# Patient Record
Sex: Female | Born: 1965 | Race: White | Hispanic: No | Marital: Married | Smoking: Former smoker
Health system: Southern US, Community
[De-identification: ages and names within clinical notes are randomized; demographics above are authoritative.]

## PROBLEM LIST (undated history)

## (undated) DIAGNOSIS — F329 Major depressive disorder, single episode, unspecified: Secondary | ICD-10-CM

## (undated) DIAGNOSIS — F319 Bipolar disorder, unspecified: Secondary | ICD-10-CM

## (undated) DIAGNOSIS — I10 Essential (primary) hypertension: Secondary | ICD-10-CM

## (undated) DIAGNOSIS — I2510 Atherosclerotic heart disease of native coronary artery without angina pectoris: Secondary | ICD-10-CM

## (undated) DIAGNOSIS — F419 Anxiety disorder, unspecified: Secondary | ICD-10-CM

## (undated) DIAGNOSIS — Z720 Tobacco use: Secondary | ICD-10-CM

## (undated) DIAGNOSIS — Z9989 Dependence on other enabling machines and devices: Secondary | ICD-10-CM

## (undated) DIAGNOSIS — F909 Attention-deficit hyperactivity disorder, unspecified type: Secondary | ICD-10-CM

## (undated) DIAGNOSIS — I779 Disorder of arteries and arterioles, unspecified: Secondary | ICD-10-CM

## (undated) DIAGNOSIS — I739 Peripheral vascular disease, unspecified: Secondary | ICD-10-CM

## (undated) DIAGNOSIS — M797 Fibromyalgia: Secondary | ICD-10-CM

## (undated) DIAGNOSIS — I639 Cerebral infarction, unspecified: Secondary | ICD-10-CM

## (undated) DIAGNOSIS — J439 Emphysema, unspecified: Secondary | ICD-10-CM

## (undated) DIAGNOSIS — G4730 Sleep apnea, unspecified: Secondary | ICD-10-CM

## (undated) DIAGNOSIS — J449 Chronic obstructive pulmonary disease, unspecified: Secondary | ICD-10-CM

## (undated) DIAGNOSIS — G4733 Obstructive sleep apnea (adult) (pediatric): Secondary | ICD-10-CM

## (undated) DIAGNOSIS — I5032 Chronic diastolic (congestive) heart failure: Secondary | ICD-10-CM

## (undated) DIAGNOSIS — K219 Gastro-esophageal reflux disease without esophagitis: Secondary | ICD-10-CM

## (undated) DIAGNOSIS — F32A Depression, unspecified: Secondary | ICD-10-CM

## (undated) DIAGNOSIS — E785 Hyperlipidemia, unspecified: Secondary | ICD-10-CM

## (undated) HISTORY — DX: Emphysema, unspecified: J43.9

## (undated) HISTORY — DX: Chronic obstructive pulmonary disease, unspecified: J44.9

## (undated) HISTORY — DX: Chronic diastolic (congestive) heart failure: I50.32

## (undated) HISTORY — DX: Sleep apnea, unspecified: G47.30

## (undated) HISTORY — PX: CARDIAC CATHETERIZATION: SHX172

---

## 1988-10-25 HISTORY — PX: TUBAL LIGATION: SHX77

## 2006-09-07 ENCOUNTER — Emergency Department (HOSPITAL_COMMUNITY): Admission: EM | Admit: 2006-09-07 | Discharge: 2006-09-07 | Payer: Self-pay | Admitting: Emergency Medicine

## 2007-10-26 HISTORY — PX: CORONARY ARTERY BYPASS GRAFT: SHX141

## 2008-09-14 ENCOUNTER — Emergency Department (HOSPITAL_COMMUNITY): Admission: EM | Admit: 2008-09-14 | Discharge: 2008-09-14 | Payer: Self-pay | Admitting: Emergency Medicine

## 2008-10-07 ENCOUNTER — Ambulatory Visit: Payer: Self-pay | Admitting: Cardiothoracic Surgery

## 2008-10-08 ENCOUNTER — Encounter: Payer: Self-pay | Admitting: Cardiothoracic Surgery

## 2008-10-08 ENCOUNTER — Inpatient Hospital Stay (HOSPITAL_COMMUNITY): Admission: EM | Admit: 2008-10-08 | Discharge: 2008-10-17 | Payer: Self-pay | Admitting: Emergency Medicine

## 2008-10-09 ENCOUNTER — Encounter: Payer: Self-pay | Admitting: Cardiothoracic Surgery

## 2008-11-08 ENCOUNTER — Ambulatory Visit: Payer: Self-pay | Admitting: Cardiothoracic Surgery

## 2008-11-08 ENCOUNTER — Encounter: Admission: RE | Admit: 2008-11-08 | Discharge: 2008-11-08 | Payer: Self-pay | Admitting: Cardiothoracic Surgery

## 2008-11-22 ENCOUNTER — Encounter: Admission: RE | Admit: 2008-11-22 | Discharge: 2008-11-22 | Payer: Self-pay | Admitting: Cardiothoracic Surgery

## 2008-11-22 ENCOUNTER — Ambulatory Visit: Payer: Self-pay | Admitting: Cardiothoracic Surgery

## 2008-12-23 DIAGNOSIS — I639 Cerebral infarction, unspecified: Secondary | ICD-10-CM

## 2008-12-23 HISTORY — DX: Cerebral infarction, unspecified: I63.9

## 2009-06-17 ENCOUNTER — Emergency Department (HOSPITAL_COMMUNITY): Admission: EM | Admit: 2009-06-17 | Discharge: 2009-06-17 | Payer: Self-pay | Admitting: Emergency Medicine

## 2009-06-26 ENCOUNTER — Encounter: Payer: Self-pay | Admitting: Cardiology

## 2009-06-30 ENCOUNTER — Emergency Department (HOSPITAL_COMMUNITY): Admission: EM | Admit: 2009-06-30 | Discharge: 2009-06-30 | Payer: Self-pay | Admitting: Emergency Medicine

## 2009-09-23 ENCOUNTER — Encounter (INDEPENDENT_AMBULATORY_CARE_PROVIDER_SITE_OTHER): Payer: Self-pay | Admitting: *Deleted

## 2009-09-23 ENCOUNTER — Ambulatory Visit: Payer: Self-pay | Admitting: Cardiology

## 2009-09-23 DIAGNOSIS — IMO0001 Reserved for inherently not codable concepts without codable children: Secondary | ICD-10-CM

## 2009-09-23 DIAGNOSIS — I10 Essential (primary) hypertension: Secondary | ICD-10-CM | POA: Insufficient documentation

## 2009-09-23 DIAGNOSIS — M797 Fibromyalgia: Secondary | ICD-10-CM | POA: Insufficient documentation

## 2009-09-23 DIAGNOSIS — F17200 Nicotine dependence, unspecified, uncomplicated: Secondary | ICD-10-CM | POA: Insufficient documentation

## 2009-09-23 DIAGNOSIS — F0630 Mood disorder due to known physiological condition, unspecified: Secondary | ICD-10-CM | POA: Insufficient documentation

## 2009-09-23 DIAGNOSIS — G2581 Restless legs syndrome: Secondary | ICD-10-CM | POA: Insufficient documentation

## 2009-09-23 DIAGNOSIS — K219 Gastro-esophageal reflux disease without esophagitis: Secondary | ICD-10-CM | POA: Insufficient documentation

## 2009-09-29 ENCOUNTER — Encounter: Payer: Self-pay | Admitting: Cardiology

## 2009-09-29 ENCOUNTER — Encounter (INDEPENDENT_AMBULATORY_CARE_PROVIDER_SITE_OTHER): Payer: Self-pay | Admitting: *Deleted

## 2009-09-29 LAB — CONVERTED CEMR LAB
AST: 13 units/L (ref 0–37)
Albumin: 4.7 g/dL
Basophils Absolute: 0.1 10*3/uL (ref 0.0–0.1)
Basophils Relative: 1 % (ref 0–1)
CO2: 24 meq/L
Calcium: 9.9 mg/dL
Calcium: 9.9 mg/dL (ref 8.4–10.5)
Chloride: 98 meq/L (ref 96–112)
Cholesterol: 178 mg/dL (ref 0–200)
Creatinine, Ser: 0.7 mg/dL
Eosinophils Absolute: 0.1 10*3/uL (ref 0.0–0.7)
HDL: 43 mg/dL
Lymphocytes Relative: 32 % (ref 12–46)
Lymphs Abs: 3 10*3/uL (ref 0.7–4.0)
Monocytes Absolute: 0.5 10*3/uL (ref 0.1–1.0)
RBC: 4.82 M/uL (ref 3.87–5.11)
Sodium: 136 meq/L (ref 135–145)
TSH: 1.189 microintl units/mL
TSH: 1.189 microintl units/mL (ref 0.350–4.500)
Total CHOL/HDL Ratio: 4.1
Total Protein: 8.1 g/dL
Triglycerides: 185 mg/dL
VLDL: 37 mg/dL (ref 0–40)
WBC: 9.4 10*3/uL (ref 4.0–10.5)

## 2009-10-02 ENCOUNTER — Ambulatory Visit: Payer: Self-pay | Admitting: Cardiology

## 2009-10-02 ENCOUNTER — Ambulatory Visit (HOSPITAL_COMMUNITY): Admission: RE | Admit: 2009-10-02 | Discharge: 2009-10-02 | Payer: Self-pay | Admitting: Cardiology

## 2009-10-02 ENCOUNTER — Encounter (INDEPENDENT_AMBULATORY_CARE_PROVIDER_SITE_OTHER): Payer: Self-pay | Admitting: *Deleted

## 2009-10-02 ENCOUNTER — Encounter: Payer: Self-pay | Admitting: Cardiology

## 2009-10-03 ENCOUNTER — Encounter (INDEPENDENT_AMBULATORY_CARE_PROVIDER_SITE_OTHER): Payer: Self-pay | Admitting: *Deleted

## 2009-10-07 ENCOUNTER — Ambulatory Visit: Payer: Self-pay | Admitting: Cardiology

## 2009-10-07 ENCOUNTER — Encounter (INDEPENDENT_AMBULATORY_CARE_PROVIDER_SITE_OTHER): Payer: Self-pay | Admitting: *Deleted

## 2009-10-07 DIAGNOSIS — I639 Cerebral infarction, unspecified: Secondary | ICD-10-CM | POA: Insufficient documentation

## 2009-10-07 DIAGNOSIS — I679 Cerebrovascular disease, unspecified: Secondary | ICD-10-CM

## 2009-10-08 ENCOUNTER — Encounter: Payer: Self-pay | Admitting: Cardiology

## 2009-12-03 ENCOUNTER — Emergency Department (HOSPITAL_COMMUNITY): Admission: EM | Admit: 2009-12-03 | Discharge: 2009-12-04 | Payer: Self-pay | Admitting: Emergency Medicine

## 2010-01-11 ENCOUNTER — Emergency Department (HOSPITAL_COMMUNITY): Admission: EM | Admit: 2010-01-11 | Discharge: 2010-01-11 | Payer: Self-pay | Admitting: Emergency Medicine

## 2010-07-07 ENCOUNTER — Encounter (INDEPENDENT_AMBULATORY_CARE_PROVIDER_SITE_OTHER): Payer: Self-pay | Admitting: *Deleted

## 2010-07-21 IMAGING — CR DG CHEST 1V PORT
1 series · 1 of 1 positions shown · non-contrast
Comparison: Portable chest 10/13/2008.

CLINICAL DATA: Status post CABG.

PORTABLE CHEST - 1 VIEW

[view not recorded]
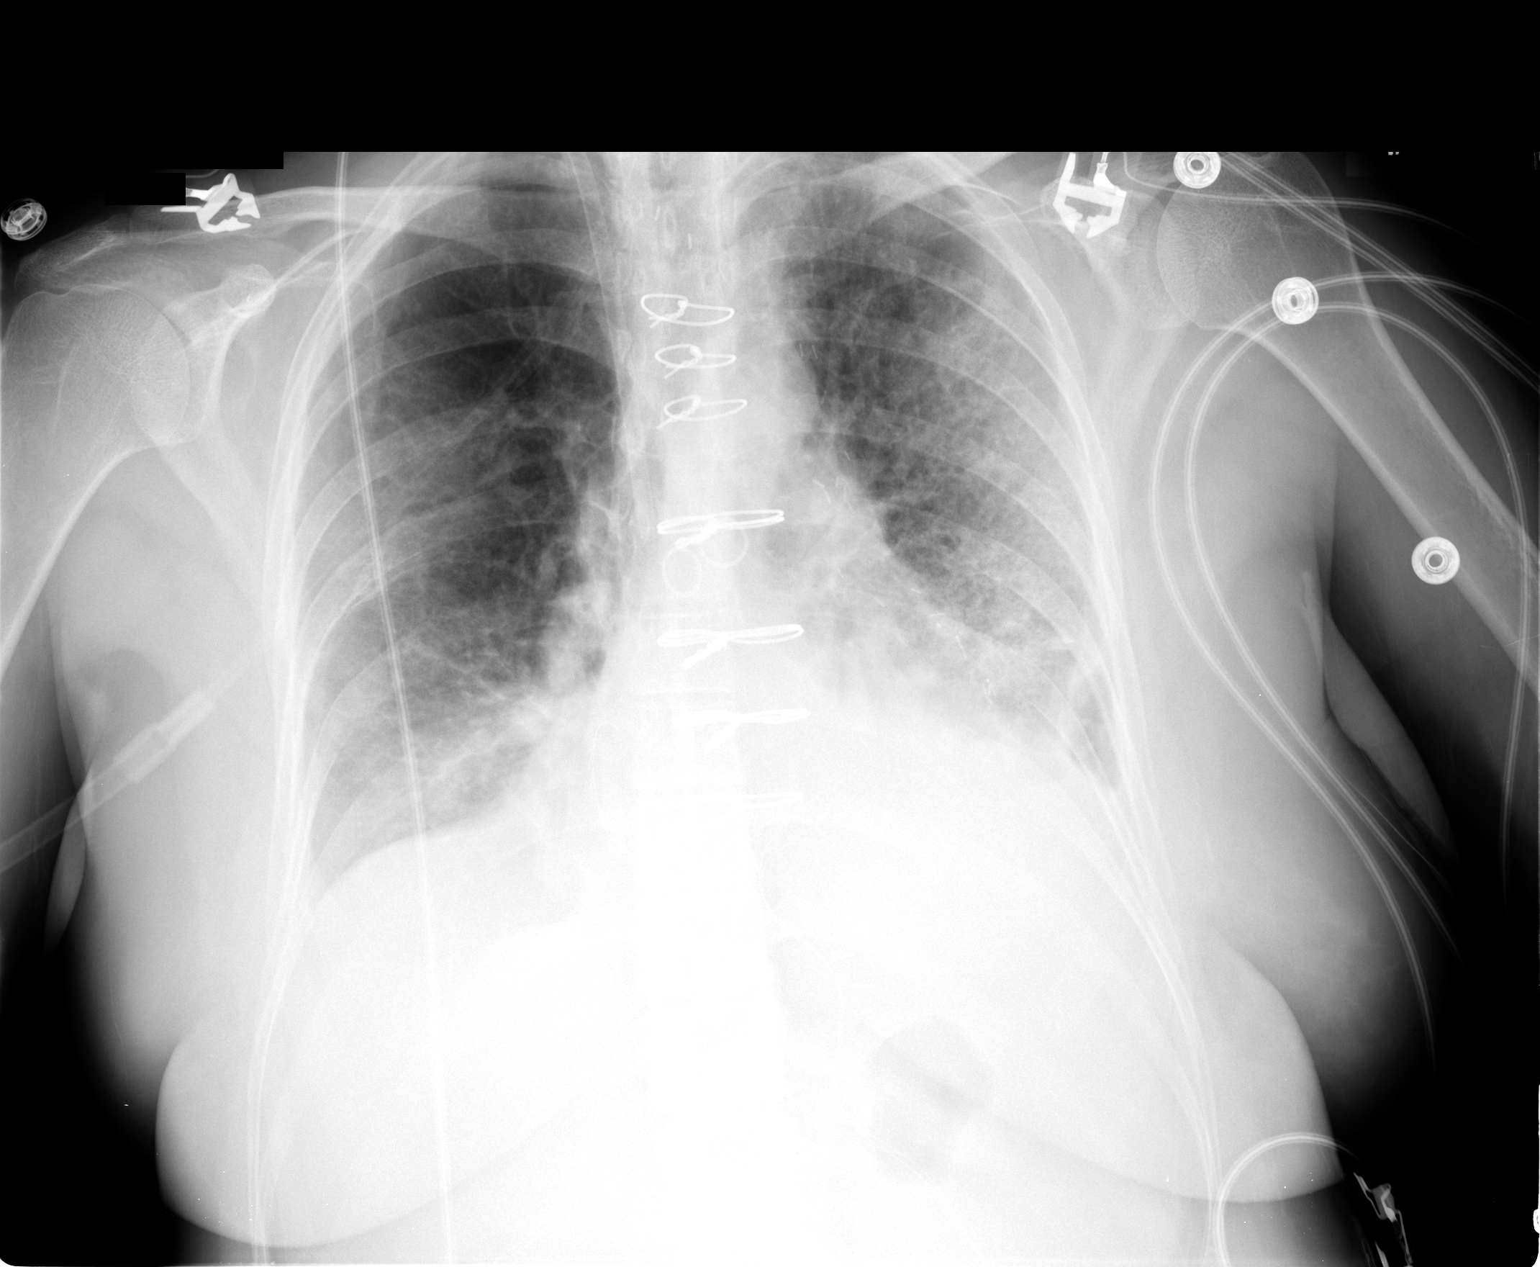

[1 of 1 positions shown; findings below may reference images not displayed]

FINDINGS: Right IJ sheath remains in place.  Aeration of the right
chest is markedly improved.  Small left effusion and basilar air
space disease persist.
IMPRESSION: Marked improvement right-sided aeration is likely due to decreased
pulmonary edema.  No other change.

## 2010-08-29 IMAGING — CR DG CHEST 2V
2 series · 2 of 2 positions shown · non-contrast
Comparison: 11/08/2008.

CLINICAL DATA: Status post CABG 10/10/2008.

CHEST - 2 VIEW

[w chest pa]
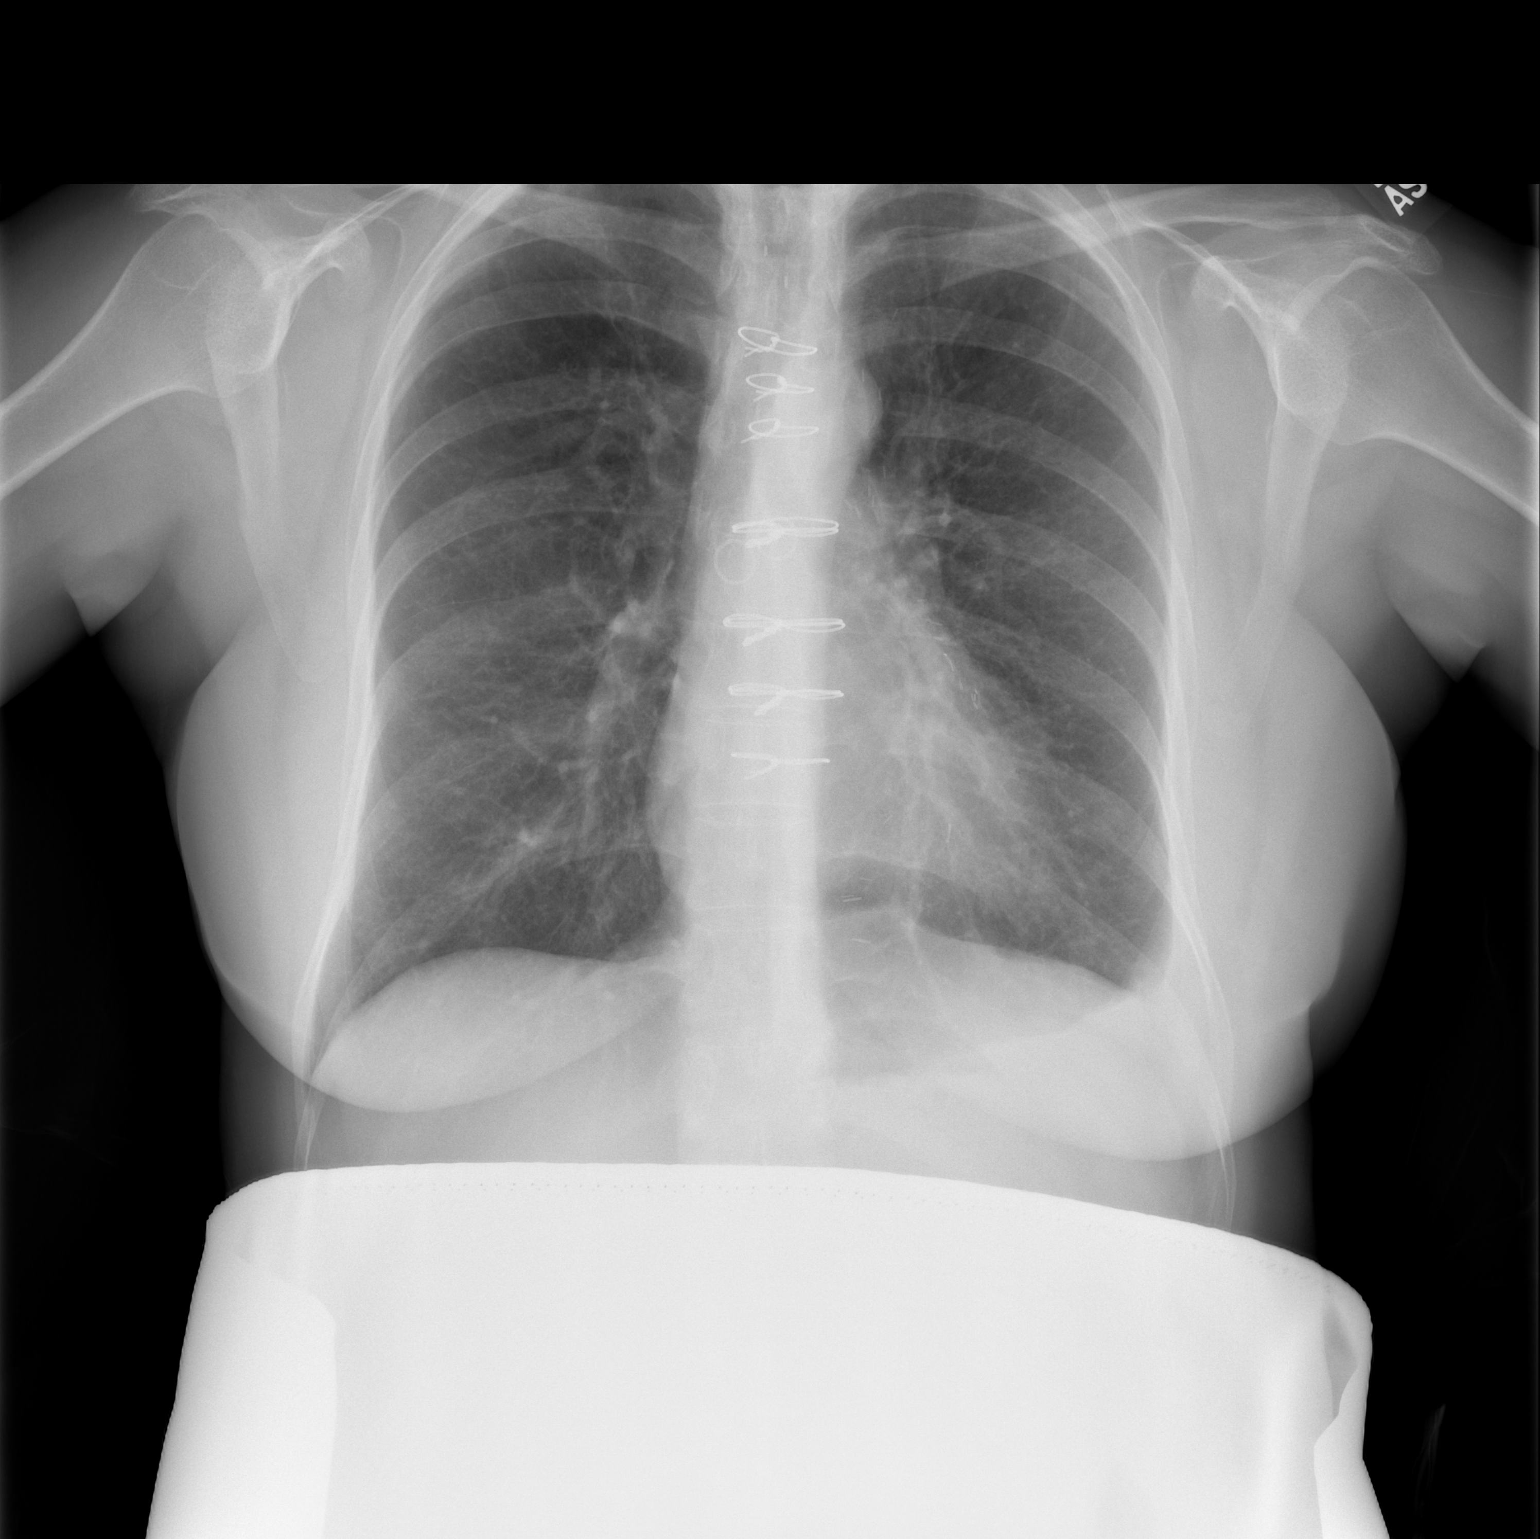

[w chest lat]
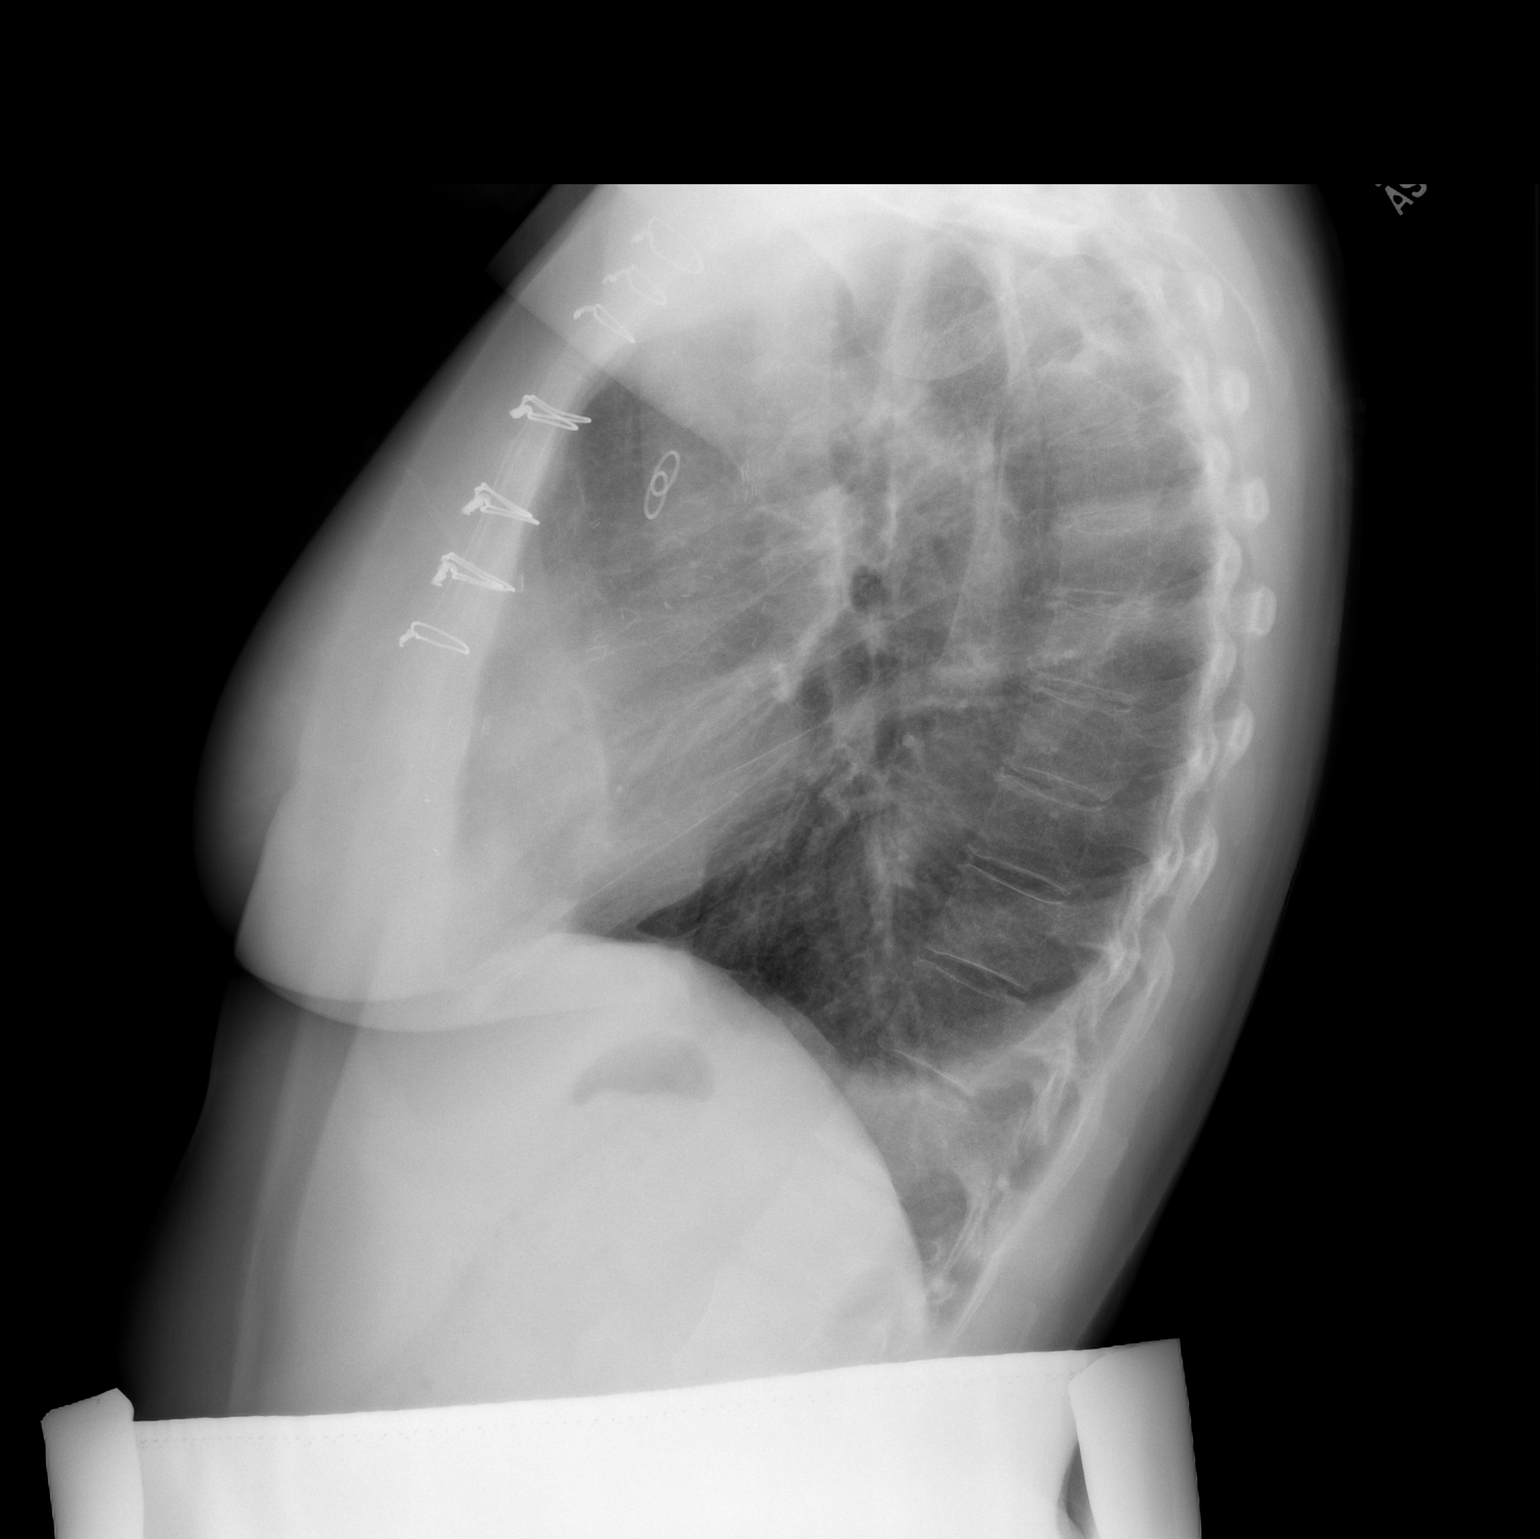

[2 of 2 positions shown; findings below may reference images not displayed]

FINDINGS: The cardiopericardial silhouette is within normal limits
for size.  The patient is status post median sternotomy for CABG.
There is interval decrease and a left pleural effusion.  Minimal
atelectasis is present at the left base.  There is improved overall
aeration.
IMPRESSION: 1.  Continued decrease in left pleural effusion.
2.  Improved aeration of both lungs.

## 2010-11-15 ENCOUNTER — Encounter: Payer: Self-pay | Admitting: Cardiology

## 2010-11-24 NOTE — Miscellaneous (Signed)
Summary: lisinopril refill  Clinical Lists Changes  Medications: Rx of LISINOPRIL 40 MG TABS (LISINOPRIL) Take one tablet by mouth daily;  #90 x 3;  Signed;  Entered by: Teressa Lower RN;  Authorized by: Kathlen Brunswick, MD, [facility];  Method used: Electronically to [facility] [address], [place], [place], [place]  [address], Ph: [phone], Fax: [phone]    Prescriptions: LISINOPRIL 40 MG TABS (LISINOPRIL) Take one tablet by mouth daily  #90 x 3   Entered by:   Teressa Lower RN   Authorized by:   Kathlen Brunswick, MD, [facility]   Signed by:   Teressa Lower RN on 07/07/2010   Method used:   Electronically to        [facility] (retail)       [address]       [place], [place]  [address]       Ph: [phone]       Fax: [phone]   RxID:   [id number]

## 2011-01-13 LAB — CBC
HCT: 40.3 % (ref 36.0–46.0)
MCHC: 34.2 g/dL (ref 30.0–36.0)
MCV: 87.8 fL (ref 78.0–100.0)
RBC: 4.59 MIL/uL (ref 3.87–5.11)

## 2011-01-13 LAB — BASIC METABOLIC PANEL
CO2: 25 mEq/L (ref 19–32)
Creatinine, Ser: 0.67 mg/dL (ref 0.4–1.2)
Glucose, Bld: 96 mg/dL (ref 70–99)
Sodium: 136 mEq/L (ref 135–145)

## 2011-01-13 LAB — DIFFERENTIAL
Basophils Absolute: 0.2 10*3/uL — ABNORMAL HIGH (ref 0.0–0.1)
Basophils Relative: 1 % (ref 0–1)
Monocytes Relative: 5 % (ref 3–12)
Neutrophils Relative %: 69 % (ref 43–77)

## 2011-01-13 LAB — URINALYSIS, ROUTINE W REFLEX MICROSCOPIC
Bilirubin Urine: NEGATIVE
Hgb urine dipstick: NEGATIVE
Ketones, ur: 15 mg/dL — AB
Protein, ur: NEGATIVE mg/dL
Specific Gravity, Urine: 1.024 (ref 1.005–1.030)

## 2011-01-29 LAB — RAPID STREP SCREEN (MED CTR MEBANE ONLY): Streptococcus, Group A Screen (Direct): POSITIVE — AB

## 2011-01-30 LAB — COMPREHENSIVE METABOLIC PANEL
ALT: 14 U/L (ref 0–35)
Albumin: 3.8 g/dL (ref 3.5–5.2)
Alkaline Phosphatase: 86 U/L (ref 39–117)
Chloride: 102 mEq/L (ref 96–112)
Potassium: 3.3 mEq/L — ABNORMAL LOW (ref 3.5–5.1)
Sodium: 137 mEq/L (ref 135–145)
Total Bilirubin: 0.7 mg/dL (ref 0.3–1.2)
Total Protein: 7.1 g/dL (ref 6.0–8.3)

## 2011-01-30 LAB — CBC
HCT: 37.1 % (ref 36.0–46.0)
HCT: 37.9 % (ref 36.0–46.0)
Hemoglobin: 12.5 g/dL (ref 12.0–15.0)
MCHC: 33.7 g/dL (ref 30.0–36.0)
Platelets: 327 10*3/uL (ref 150–400)
RBC: 4.09 MIL/uL (ref 3.87–5.11)
RDW: 15.3 % (ref 11.5–15.5)
WBC: 21.1 10*3/uL — ABNORMAL HIGH (ref 4.0–10.5)

## 2011-01-30 LAB — POCT CARDIAC MARKERS
CKMB, poc: <1 ng/mL — ABNORMAL LOW (ref 1.0–8.0)
Myoglobin, poc: 68 ng/mL (ref 12–200)

## 2011-01-30 LAB — DIFFERENTIAL
Basophils Absolute: 0.1 10*3/uL (ref 0.0–0.1)
Basophils Relative: 0 % (ref 0–1)
Eosinophils Absolute: 0.1 10*3/uL (ref 0.0–0.7)
Eosinophils Relative: 0 % (ref 0–5)
Eosinophils Relative: 0 % (ref 0–5)
Lymphocytes Relative: 11 % — ABNORMAL LOW (ref 12–46)
Lymphs Abs: 2.4 10*3/uL (ref 0.7–4.0)
Monocytes Absolute: 0.3 10*3/uL (ref 0.1–1.0)
Monocytes Relative: 2 % — ABNORMAL LOW (ref 3–12)
Neutro Abs: 18.1 10*3/uL — ABNORMAL HIGH (ref 1.7–7.7)
Neutrophils Relative %: 86 % — ABNORMAL HIGH (ref 43–77)

## 2011-01-30 LAB — URINALYSIS, ROUTINE W REFLEX MICROSCOPIC
Glucose, UA: NEGATIVE mg/dL
Ketones, ur: NEGATIVE mg/dL
Protein, ur: NEGATIVE mg/dL
Urobilinogen, UA: 0.2 mg/dL (ref 0.0–1.0)

## 2011-01-30 LAB — URINE MICROSCOPIC-ADD ON

## 2011-01-30 LAB — POCT I-STAT, CHEM 8
BUN: 5 mg/dL — ABNORMAL LOW (ref 6–23)
Chloride: 104 mEq/L (ref 96–112)
Creatinine, Ser: 0.6 mg/dL (ref 0.4–1.2)
Potassium: 3.5 mEq/L (ref 3.5–5.1)
Sodium: 137 mEq/L (ref 135–145)
TCO2: 23 mmol/L (ref 0–100)

## 2011-01-30 LAB — CK TOTAL AND CKMB (NOT AT ARMC)
CK, MB: 1.2 ng/mL (ref 0.3–4.0)
Total CK: 63 U/L (ref 7–177)

## 2011-03-09 NOTE — Discharge Summary (Signed)
[name], [name]               ACCOUNT NO.:  [id number]   MEDICAL RECORD NO.:  [id number]          PATIENT TYPE:  INP   LOCATION:  2021                         FACILITY:  [facility]   PHYSICIAN:  Kerin Perna, M.D.  DATE OF BIRTH:  [birth year]   DATE OF ADMISSION:  10/07/2008  DATE OF DISCHARGE:  10/17/2008                               DISCHARGE SUMMARY   ADMITTING DIAGNOSES:  1. Non-ST segment elevation myocardial infarction.  2. Multivessel coronary artery disease (with an ejection fraction of      greater than 55%).  3. History of hypertension.  4. History of tobacco abuse.  5. History of gastroesophageal reflux disease.  6. History of bipolar disorder.   DISCHARGE DIAGNOSIS:  1. Non-ST segment elevation myocardial infarction.  2. Multivessel coronary artery disease (with an ejection fraction of      greater than 55%).  3. History of hypertension.  4. History of tobacco abuse.  5. History of gastroesophageal reflux disease.  6. History of bipolar disorder.  7. Newly diagnosed hyperlipidemia and elevated ESR.   PROCEDURES:  1. Cardiac catheterization performed on October 08, 2008, by Dr.      Clarene Duke, which showed an ejection fraction of greater than 55%, 80%      narrowing of the left anterior descending as well as an associated      ruptured plaque, 40-50% tubular area of narrowing of the right      coronary artery.  2. Coronary artery bypass grafting x3 (left internal mammary artery to      left anterior descending, saphenous vein graft to ramus      intermedius, and saphenous vein graft to right coronary artery with      endovein harvest on the left thigh done by Dr. Donata Clay on      October 10, 2008.   HISTORY OF PRESENT ILLNESS:  This is a 45 year old Caucasian female with  prior medical history of hypertension and tobacco abuse who was admitted  to [facility] with complaint of chest pain.  The patient states this  chest pain began approximately 2 weeks prior  to admission.  She  described it as a pressure-like sensation beneath the sternum that occur  with activity and was relieved with either rest or lying down.  She  initially presented to [facility] where apparently an MI  was ruled out.  However, her chest pain continued and then worsened.  She then had complaints of constant pressure radiating to both arms and  shoulders with some nauseousness and shortness of breath.  She also  described some dyspnea upon exertion and generalized weakness.  The  patient presented to [facility] Room for further evaluation  and treatment.  Her initial EKG showed sinus rhythm without acute  changes.  CPK-MB was found to be 10.1 and initial troponin was 0.3, it  did go as high as 2.10, however.  She was placed on IV heparin and  nitroglycerin.  Cardiology was consulted.  She underwent the  aforementioned cardiac catheterization on August 08, 2008, and was  found to have  multivessel coronary artery disease.  A cardiothoracic  consultation was then obtained with Dr. Donata Clay.  Preoperative duplex  carotid ultrasound showed 60-80% internal carotid artery stenosis of the  right but no significant left internal carotid artery stenosis.  The  patient then underwent the aforementioned coronary artery bypass  grafting surgery with Dr. Donata Clay on October 10, 2008.   BRIEF HOSPITAL COURSE STAY:  The patient was extubated the evening of  surgery.  She was A-paced.  She remained afebrile and hemodynamically  stable.  She was weaned off her drips as tolerated.  She was volume  overloaded, and once her drips were weaned off, diuresis was initiated.  All chest tubes were removed by October 11, 2008.  Followup chest x-ray  revealed no pneumothorax, slight increase in prominence of pulmonary  edema and pleural effusions.  Aggressive pulmonary toilet was initiated  for hypoxia and was still requiring a several liters of oxygen via nasal   cannula.  ESR on October 13, 2008, was found to be 57.  The patient was  then placed on Solu-Medrol for several doses.  This was then stopped and  she was started on prednisone.  She gradually continued to improve from  a pulmonary standpoint over the next couple of days.  She was then  transferred from the intensive care unit to [facility] for further  convalescence.  Currently on postoperative day #7, the patient is  without complaint, she would like to go home.  She is afebrile, heart  rate is in the 70s, BP is 160/65, O2 sat is 90% on room air.  She was on  1 L prior and her O2 sat was 93%.  Preoperative weight is 64 kg, today's  weight is 67.7 kg.   PHYSICAL EXAMINATION:  CARDIOVASCULAR:  Regular rate and rhythm.  PULMONARY:  Clear but diminished at the left base.  ABDOMEN:  Soft, nontender.  Bowel sounds present.  EXTREMITY:  A +1 edema of the lower extremities.  WOUNDS:  Both sternal wounds and bilateral leg wounds are clean and dry.   The patient has already been tolerating a diet and continued to progress  well with cardiac rehab.  She remained on 94-95% on room air after  walking earlier this morning and provided she remains afebrile and  stable.  She is going to be discharged home later today.   Last laboratory results all are as follows:  BMET done today, potassium  4.1, BUN and creatinine 24 and 0.78 respectively.  A CBC done on  October 16, 2008, H and H 9.5 and 29.2, white count of 16,500, platelet  count of 449,000.  Last chest x-ray done today showed no significant  change, persistent left lower lobe atelectasis and small pleural  effusion, mild interstitial edema.   DISCHARGE INSTRUCTIONS:  The patient is to remain on a low-fat, low-salt  diet.  No driving or heavy lifting more than 10 pounds.  She is to  continue with her breathing exercises daily.  She is to walk everyday  and increase her frequency and duration as tolerates.  She may shower.  She is to cleanse her  wounds with mild soap and water.  She is to call  the office if any wound problems arise.  She is instructed to stop  smoking and given a number to enroll in smoking cessation classes.   FOLLOWUP APPOINTMENTS:  The patient is to contact Dr. Fredirick Maudlin office  for a followup appointment in 2 weeks.  She  will be contacted by Dr. Zenaida Niece  Trigt's office to have a followup appointment with him in 3 previous,  and prior to this office appointment, a chest x-ray will be obtained.   DISCHARGE MEDICATIONS:  1. Enteric-coated aspirin 81 mg p.o. daily.  2. Lopressor 12.5 mg p.o. 2 times daily.  3. Crestor 10 mg p.o. at bedtime.  4. Lasix 40 mg p.o. daily.  5. KCl 20 mEq p.o. daily both x7 days.  6. Avelox 400 mg p.o. daily x5 days.  7. Prednisone 20 mg p.o. daily x5 days, then 10 mg p.o. daily x2 days,      and then stop.  8. Oxycodone 5 mg 1-2 p.o. q. 4-6 h. as needed for pain.  9. Abilify 10 mg p.o. daily.  10.Zegerid 40/1100 mg p.o. daily.  11.Prozac 40 mg p.o. daily.  12.Strattera 60 mg p.o. daily.  13.Cyclobenzaprine 10 mg p.o. 2 times daily p.r.n.  14.Xanax 0.5 mg p.o. 2 times daily p.r.n.      Doree Fudge, [place]      Kerin Perna, M.D.  Electronically Signed    DZ/MEDQ  D:  10/17/2008  T:  10/18/2008  Job:  [id number]   cc:   [name], M.D.  [place] Dr. Devota Pace

## 2011-03-09 NOTE — Cardiovascular Report (Signed)
[name], [name]               ACCOUNT NO.:  [id number]   MEDICAL RECORD NO.:  [id number]          PATIENT TYPE:  INP   LOCATION:  3301                         FACILITY:  [facility]   PHYSICIAN:  [name], M.D. DATE OF BIRTH:  22-Jun-1966   DATE OF PROCEDURE:  DATE OF DISCHARGE:                            CARDIAC CATHETERIZATION   This is a 45 year old female who has had chest discomfort off and on for  several days.  She has been  [facility] emergency room, but not  admitted.  She presented here 24 hours later after having had a  horrible day including a pressure within her chest felt like a baby is  sitting on her.  Her EKG is normal, but her troponins have increased to  2.0.  She is pain free.   She has acute coronary syndrome with a subendocardial MI.  Her risk  factors include smoking, hypertension, and hyperlipidemia.   After obtaining informed consent, the patient was prepped and draped in  the usual sterile fashion exposing the right groin.  Following local  anesthetic with 1% Xylocaine, the Seldinger technique was employed.  A 6-  French introducer sheath was placed in the right femoral artery.  Left  and right coronary arteriography, evaluation for internal mammary  artery, ventriculography in the RAO projection, and distal aortogram was  performed.   COMPLICATIONS:  None.   TOTAL CONTRAST:  175 mL.   EQUIPMENT:  A 6-French Judkins configuration catheters.   RESULTS:  1. Hemodynamic monitoring:  Her central aortic pressure was 94/58.      Her left ventricular pressure was 93/6.  There was no significant      gradient at the time of pullback.  2. Ventriculography:  Ventriculography in the RAO projection using 20      mL of contrast at 12 mL per second was performed at the end of the      procedure.  She had normal LV systolic function with an ejection      fraction greater than 55%.  She has an apical wall motion:  It is      akinetic.  I did not see a filling  defect to suggest a thrombus.      Her left ventricular end-diastolic pressure was 12.  3. Distal aortogram done at the level of the renal artery showed no      evidence of renal artery stenosis and no infrarenal aneurysm.  4. Coronary arteriography:      a.     Left main is trifurcated.      b.     LAD.  At the ostium of the LAD was an 80% area of narrowing,       was associated with a ruptured plaque.  There was no landing zone       extending up to the left main and this area is not amenable to       stenting.  It could on an emergency basis be treated with a       cutting balloon, but even this would be at a high risk for  left       main injury.      c.     Optional diagonal.  This is a large vessel that bifurcated.      d.     Circumflex.  The circumflex had a large OM vessel with a 30%       ostial narrowing.  The ongoing circumflex was small with a 30%       ostial narrowing.      e.     Right coronary artery.  The right coronary was a dominant       vessel with proximal 40-50% tubular area of narrowing.  The PDA       and posterior lateral vessels x2 were free of significant disease.   CONCLUSION:  1. Normal LV systolic function with apical akinesia.  2. Ruptured plaque in the ostium of the LAD, not amenable to      percutaneous intervention.  3. Mild disease in the RCA and ostial disease in the circumflex and OM      #1.   I have asked CVTS to evaluate the patient.  I think the proximity to the  left main with no landing zone makes it extremely dangerous to intervene  upon percutaneously.  I will start her back on her IV nitroglycerin and  will place her back on her heparin 4 hours after her cath.  I am doing  this in an attempt to try to stabilize this ulcerated plaque.  She has  not been on Plavix.            ______________________________  [name], M.D.     ABL/MEDQ  D:  10/08/2008  T:  10/09/2008  Job:  [id number]   cc:   Antonieta Iba, MD  Cath Lab   Dr. Charm Barges

## 2011-03-09 NOTE — Assessment & Plan Note (Signed)
OFFICE VISIT   Monica, House  DOB:  1966-10-03                                        November 08, 2008  CHART #:  [id number]   CURRENT PROBLEMS:  1. Status post urgent multivessel coronary artery bypass surgery      October 10, 2008.  2. History of smoking, active.  3. Postoperative left pleural effusion, mild.   PRESENT ILLNESS:  The patient is a 45 year old white female without  prior history of coronary artery disease, who presented to the hospital  with chest pain.  Cardiac enzymes were elevated and she was placed on IV  heparin and nitroglycerin.  Cardiac catheterization demonstrated a 80%  stenosis of the LAD with a ruptured plaque and 50% stenosis of a lesion  in the right coronary.  A pre-CABG Doppler showed a right ICA 60-80%  stenosis.  She underwent left IMA grafting to the LAD and vein grafts to  the ramus and right coronary.  Postoperatively she has done well.  She  unfortunately still smokes 3-5 cigarettes daily.  She has some  incisional soreness, but no recurrent angina or symptoms of CHF.  The  surgical incisions are healing well and she has no significant fluid  retention.   Her current medications include Zocor, Darvocet, Xanax, Prozac, Zegerid,  Abilify, aspirin, and Lopressor 12.5 b.i.d.   PHYSICAL EXAMINATION:  VITAL SIGNS:  Blood pressure 126/90, pulse 95 and  regular, respirations 18, and saturation 96%.  LUNGS:  Breath sounds are clear and equal.  CHEST:  Sternal incision is well healed.  CARDIAC:  There is no cardiac rub or gallop.  EXTREMITIES:  The leg incisions are healing well and there is no  significant edema.   PA and lateral chest x-ray shows a left mild pleural effusion slightly  larger than her last x-ray in the hospital.  The sternal wires are  intact and the cardiac silhouette is stable.   IMPRESSION AND PLAN:  The patient has done well 3 weeks postoperatively.  She needs to completely stop smoking.  I told  her she could resume  driving and increase her activity level, but to avoid heavy lifting.  I  gave her a prescription for Lasix 40 mg a day for 1 week.  We will have  her return in 2 weeks with followup chest x-ray to check the left  pleural effusion.   [name], M.D.  Electronically Signed   PV/MEDQ  D:  11/08/2008  T:  11/09/2008  Job:  [id number]   cc:   [name], M.D.

## 2011-03-09 NOTE — Assessment & Plan Note (Signed)
OFFICE VISIT   [name], [name]  DOB:  1966/04/27                                        November 22, 2008  CHART #:  [id number]   CURRENT PROBLEMS:  1. Status post urgent multivessel coronary bypass surgery, October 10, 2008.  2. Postoperative left pleural effusion.   PRESENT ILLNESS:  The patient is a 45 year old female ex-smoker who  underwent multivessel bypass surgery in mid December for a LAD with a  ruptured plaque and three-vessel disease.  She has done well following  surgery and has stopped smoking.  On her first postop visit 2 weeks ago,  she had a moderate left pleural effusion.  This was treated with a  course of Lasix and further incentive spirometry.  She now returns for  followup.  She has been attending class at [facility]  and has had no shortness of breath, chest pain, and her strength has  gradually improved day by day.  She has no angina and the surgical  incisions are healing well.   PHYSICAL EXAMINATION:  Blood pressure 140/90, pulse 90, respirations 18,  saturation 99% on room air.  She is alert and pleasant.  Breath sounds  are now clear and equal.  The sternum is stable, well healed.  Cardiac:  Rhythm is regular.  There is no peripheral edema.   DIAGNOSTIC STUDIES:  A PA and lateral chest x-ray shows resolution of  the left pleural effusion with clear lung fields and a normal cardiac  size.   PLAN:  The patient will resume her activities, but she knows not to lift  anything greater than 20 pounds until 3 months after surgery.  I gave  her another prescription for Darvocet 30 tablets for incisional soreness  at night.  She will return as needed.   Kerin Perna, M.D.  Electronically Signed   PV/MEDQ  D:  11/22/2008  T:  11/23/2008  Job:  [id number]   cc:   Samuel Jester

## 2011-03-09 NOTE — Consult Note (Signed)
[name], HATCHER               ACCOUNT NO.:  [id number]   MEDICAL RECORD NO.:  [id number]          PATIENT TYPE:  INP   LOCATION:  3301                         FACILITY:  [facility]   PHYSICIAN:  Kerin Perna, M.D.  DATE OF BIRTH:  [birth year]   DATE OF CONSULTATION:  10/08/2008  DATE OF DISCHARGE:                                 CONSULTATION   REQUESTING PHYSICIAN:  [name], MD   PRIMARY CARE PHYSICIAN:  Dr. Devota Pace in [place].   REASON FOR CONSULTATION:  Severe coronary artery disease with unstable  angina, and subendocardial MI.   CHIEF COMPLAINT:  Chest pain.   PRESENT ILLNESS:  I was asked to evaluate this 45 year old white female,  hypertensive smoker for potential multivessel coronary artery bypass  surgery, who was recently diagnosed severe coronary artery disease.  The  patient is admitted to the hospital last night with chest pain.  The  pain began approximately 2 weeks ago and was pressure-like sensation  beneath the sternum with activity and relieved with rest or laying down.  She was initially seen and evaluated at [facility] and ruled  out for MI with negative enzymes, was discharged to home.  Her symptoms  continued and worsened.  Last night, she had constant pressure radiating  to both arms and shoulders with some nausea and shortness of breath.  She described the pain on the day of admission as like a child sitting  on her chest.  She has been having dyspnea on exertion and weakness,  generalized.  The patient was evaluated in the emergency room with  cardiac markers being positive with a CPK - MB of 10.1 and troponin of  0.3.  She was admitted and placed on IV heparin and nitroglycerin.  Cardiology evaluation was performed.  The patient was recommended for  cardiac catheterization which was performed today by Dr. Clarene Duke.  Her  EKG was a sinus rhythm without acute changes.  She is found to have a  high-grade ostial LAD stenosis with an  ulcerated plaque.  She also had a  50% stenosis of the mid dominant right coronary artery.  The EF was 55%  and EDP was 12 mmHg.  Based on her coronary anatomy and symptoms, she  was felt to be a potential candidate for surgical revascularization.   PAST MEDICAL HISTORY:  1. Hypertension.  2. Active smoking.  3. Bipolar disorder.  4. GERD.   HOME MEDICATIONS:  1. Micardis 40 mg daily.  2. Xanax 0.5 mg b.i.d.  3. Cyclobenzaprine 10 mg p.r.n.  4. Strattera 60 mg a day.  5. Prozac 40 mg a day.  6. Zegerid 1 daily.  7. Abilify 10 mg daily.   ALLERGIES:  ASPIRIN causes GI upset.   SOCIAL HISTORY:  The patient is married, with 3 children who attends  college.  She smokes a pack a day of cigarettes for over 25 years and  denies use of alcohol.   FAMILY HISTORY:  Negative for diabetes.  Negative for premature coronary  artery disease or heart bypass surgery.   REVIEW OF SYSTEMS:  CONSTITUTIONAL:  Negative for fever or weight loss.  ENT:  Negative for difficulty swallowing or dental complaints.  She  denies any history of chest trauma, abnormal chest x-ray, productive  cough, or hemoptysis.  CARDIAC:  Positive for her unstable angina and  severe coronary artery disease as previously discussed.  GI:  Positive  for GERD with negative for hepatitis, jaundice, or blood per rectum.  ENDOCRINE:  Negative diabetes or thyroid disease.  VASCULAR:  Negative  DVT, claudication, or TIA.  NEUROLOGIC:  Negative stroke or seizure.  HEMATOLOGIC:  Negative for bleeding disorder or prior blood  transfusions.  She is right-hand dominant.   PHYSICAL EXAMINATION:  VITAL SIGNS:  The patient is 5 feet 3 inches,  weighs 140 pounds, blood pressure 140/90, pulse 90, respirations 18,  saturation 100%, and temperature 97.8.  GENERAL:  She is a middle-aged female in the Step-Down Unit, in no acute  distress following cardiac catheterization and not having chest pain.  HEENT:  Normocephalic.  Pupils are  reactive.  NECK:  Without JVD, mass, or bruit.  LYMPHATICS:  No palpable cervical or supraclavicular adenopathy.  RESPIRATORY:  Breath sounds are distant, but clear.  There is no  thoracic deformity.  CARDIAC:  Regular rhythm without S3, gallop, or murmur.  ABDOMEN:  Soft and nontender without pulsatile mass.  EXTREMITIES:  No clubbing, cyanosis, or edema.  Peripheral pulses are  intact.  NEUROLOGIC:  Nonfocal   LABORATORY DATA:  Her chest x-ray showed some emphysema with no active  infiltrate or edema.  Coronary arteriograms are reviewed and she has  severe ostial LAD disease with an ulcerated plaque and significant mid  RCA disease.  Her hematocrit is 47.8 and creatinine 0.8.  Hemoglobin A1c  is 5.2.  Troponin 0.3.   IMPRESSION AND PLAN:  The patient is having unstable angina with a  subendocardial myocardial infarction with mildly positive enzymes.  Her  best long-term therapy would be surgical revascularization, which we  plan on October 10, 2008.  I discussed the procedure with the patient.  She understands and agrees.  Thanks for the consultation.      Kerin Perna, M.D.  Electronically Signed     PV/MEDQ  D:  10/08/2008  T:  10/09/2008  Job:  [id number]

## 2011-03-09 NOTE — Op Note (Signed)
[name], [name]               ACCOUNT NO.:  [id number]   MEDICAL RECORD NO.:  [id number]          PATIENT TYPE:  INP   LOCATION:  2301                         FACILITY:  [facility]   PHYSICIAN:  Kerin Perna, M.D.  DATE OF BIRTH:  11-25-1965   DATE OF PROCEDURE:  10/10/2008  DATE OF DISCHARGE:                               OPERATIVE REPORT   OPERATION:  1. Coronary artery bypass grafting x3 (left internal mammary artery to      left anterior descending, saphenous vein graft to ramus      intermediate, and saphenous vein graft to right coronary artery).  2. Endoscopic harvest of the left leg greater saphenous vein with      exposure of the right leg saphenous vein, but not harvested due to      inadequate size.   SURGEON:  Kerin Perna, MD   ASSISTANT:  Doree Fudge, PA-C   ANESTHESIA:  General.   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   CLINICAL NOTE:  The patient is a 45 year old hypertensive white female  smoker who has been having progressive exertional chest pain over the  past 2-3 weeks.  She was seen at an outside facility and her enzymes  were checked and were negative and she was sent home.  Her chest pain  progressed in intensity and frequency and she returned to the hospital  here where cardiac enzymes were positive.  She underwent cardiac  catheterization by Dr. Clarene Duke and this demonstrated an ostial LAD  stenosis with an ulcerated plaque, an ostial stenosis of the ramus  intermediate, and a mid RCA stenosis of 50-60%.  Her EF was well  preserved, and she was felt to be candidate for surgical  revascularization.  She was placed on heparin and nitroglycerin,  remained pain free and her cardiac enzyme elevation remained minimal.   I examined the patient in her hospital room and reviewed results of the  cardiac cath with the patient and her family.  I discussed indications  and expected benefits of coronary artery bypass surgery for treatment of  her coronary artery disease.  I reviewed the alternatives to surgical  therapy as well.  I discussed with her the major issues of surgery  including the choice of conduit to include internal mammary artery and  endoscopically harvested saphenous vein, the location of the surgical  incisions, use of general anesthesia, and cardiopulmonary bypass, and  the expected postoperative hospital recovery.  I discussed with Echo Propp the risks to her of coronary bypass surgery including risks of  MI, stroke, bleeding, infection, and death.  After reviewing these  issues, she demonstrated her understanding and agreed to proceed with  surgery under what I felt was an informed consent.   OPERATIVE FINDINGS:  1. Severe multivessel coronary artery disease, treated with good      grafts with good conduit and adequate targets.  2. 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  3. Preoperative anemia requiring transfusion of  2 units of packed      cells to maintain a hemoglobin of greater than 8.0 g.   PROCEDURE:  The patient was brought to the operative room and placed  supine on the operating room table where general anesthesia was induced.  The chest, abdomen, and legs were prepped with Betadine and draped as a  sterile field.  A sternal incision was made and the saphenous vein was  harvested endoscopically from the left leg.  The right leg vein was  exposed.  It was too small to use as a conduit.  The left internal  mammary artery was harvested as a pedicle graft from its origin at the  subclavian vessels.  This was somewhat small 1.2-mm vessel, but had  excellent flow.  The sternal retractor was placed using the deep blades.  The pericardium was opened and suspended.  Pursestrings were placed in  the ascending aorta and right atrium and after the  patient's vein had  been harvested and examined and found to be adequate, the patient was  fully heparinized and the ACT documented as being therapeutic.  The  patient was then cannulated and placed on bypass and the coronaries were  identified for grafting.  Cardioplegic catheters were placed for both  antegrade aortic and retrograde coronary sinus cardioplegia.  The  patient was cooled to 32 degrees on bypass, and the cross-clamp was  applied.  A 800 mL of cold blood cardioplegia was delivered in split  doses between the antegrade aortic and retrograde coronary sinus  catheters.  There was good cardioplegic arrest and septal temperature  dropped less than 10 degrees.  Cardioplegia was delivered every 20  minutes or less while the cross-clamp was in place.   The distal coronary anastomoses were then performed.  First distal  anastomosis was to the distal right.  This was a 1.8-mm vessel with  proximal 60% stenosis and a reverse saphenous vein was sewn end-to-side  with running 7-0 Prolene with good flow through graft.  The second  distal anastomosis was the ramus intermediate.  This had a proximal 50-  70% stenosis at its ostium with a calcified plaque.  A reverse saphenous  vein was sewn end-to-side with running 7-0 Prolene to this 1.5-mm vessel  with good flow through graft.  Cardioplegia was redosed.  The third  distal anastomosis was to the mid to distal LAD.  This was 1.5 mm  vessel.  There was a proximal 80% stenosis.  The left IMA pedicle was  brought through an opening created in the left lateral pericardium.  It  was brought onto the LAD and sewn end-to-side with running 8-0 Prolene.  There was good flow through the anastomosis after briefly releasing  pedicle bulldog and the mammary pedicle.  The bulldog was reapplied and  the pedicle was secured to the epicardium with Prolene sutures.  Cardioplegia was redosed.   While the cross-clamp was still in place, 2 proximal vein  anastomoses  were performed on the ascending aorta using a 4.0-mm punch running 7-0  Prolene.  Prior to tying down the final proximal anastomosis, air was  vented from the coronaries with a dose of retrograde warm blood  cardioplegia.  Cross-clamp was then removed.   The heart resumed a spontaneous rhythm.  Air was aspirated from the vein  grafts with a 27-gauge needle.  The cardioplegic catheters were removed.  The bypass grafts were checked and had good flow and hemostasis was  documented at the proximal and  distal anastomoses.  Temporary pacing  wires were applied, and the patient was rewarmed to 37 degrees.  The  lungs re-expanded and the ventilator was resumed.  The patient was  weaned from bypass without difficulty, not requiring inotropes.  Blood  pressure and cardiac output were stable.  Protamine was administered  without adverse reaction.  The cannulas removed and mediastinum was  irrigated with warm antibiotic irrigation.  There was still considerable  diffuse coagulopathy and the platelet count was low, so we administered  1 unit of platelets with improved coagulation function.  The superior  pericardial fat was closed over the aorta.  Two  mediastinal and a left pleural chest tube were placed and brought  through separate incisions.  The sternum was closed with interrupted  steel wire.  The pectoralis fascia was closed using running #1 Vicryl.  Subcutaneous and skin layers were closed using running Vicryl and  sterile dressings were applied.  Total bypass time was 102 minutes.      Kerin Perna, M.D.  Electronically Signed     PV/MEDQ  D:  10/10/2008  T:  10/11/2008  Job:  [id number]   cc:   Dr. Clarene Duke

## 2011-03-12 NOTE — Op Note (Signed)
[name], [name]             ACCOUNT NO.:  [id number]   MEDICAL RECORD NO.:  [id number]          PATIENT TYPE:  EMS   LOCATION:  ED                            FACILITY:  [facility]   PHYSICIAN:  Juluis Mire, M.D.   DATE OF BIRTH:  02/07/1966   DATE OF PROCEDURE:  10/07/2006  DATE OF DISCHARGE:  09/07/2006                               OPERATIVE REPORT   PREOPERATIVE DIAGNOSIS:  Adenomyosis.   POSTOPERATIVE DIAGNOSIS:  Adenomyosis.   OPERATIVE PROCEDURE:  Laparoscopic assisted vaginal hysterectomy.   SURGEON:  Juluis Mire, M.D.   ASSISTANT:  Duke Salvia. Marcelle Overlie, M.D.   ANESTHESIA:  Was general endotracheal.   ESTIMATED BLOOD LOSS:  400 mL.   PACKS AND DRAINS:  None.   INTRAOPERATIVE BLOOD REPLACED:  None.   COMPLICATIONS:  None.   INDICATIONS:  Dictated in the history and physical.   PROCEDURE AS FOLLOWS:  The patient taken to the OR and placed in supine  position.  After satisfactory level of general tracheal anesthesia  obtained, the patient was placed in dorsal position using the Allen  stirrups.  At this point in time, the abdomen, perineum, and vagina were  prepped and draped with Betadine.  Bladder had been emptied by out  catheterization.  A Hulka tenaculum was then put in place.  The patient  was draped as a sterile field.  Subumbilical incision was made with a  knife, extended to the subcutaneous tissue to the fascia.  The fascia  was entered sharply and the incision in the fascia extended laterally.  We could enter the peritoneum with blunt finger pressure.  Taut open  laparoscopic trocar was put in place and secured.  Laparoscope was  introduced.  There was no evidence of injury to adjacent organs.  Upper  abdomen including liver were clear.  Appendix was surgically absent.  Both lateral gutters were cleared.  Tubes and ovaries and uterus were  unremarkable.  She had a functional cyst on the right ovary.  A 5-mm  trocar was put in place in the suprapubic  area under direct  visualization.  Uterus was elevated.  The right utero-ovarian pedicle  was cauterized and incised using the gyrus.  The right tube and  mesosalpinx was cauterized and incised, and the right round ligament was  cauterized and incised.  We then went to the left side.  Left utero-  ovarian pedicle was cauterized incised.  Left tube and mesosalpinx was  cauterized incised.  Left round ligament was cauterized incised.  We had  good hemostasis.  The abdomen was deflated of carbon dioxide.  Laparoscope was removed.   The Hulka tenaculum was then removed.  The patient's legs were  repositioned.  Weighted speculum was placed in vaginal vault.  Cervix  grasped with Christella Hartigan tenaculum.  Cul-de-sac was entered sharply.  Both  uterosacral ligaments were clamped, cut and suture ligated with 0  Vicryl.  Reflection of the vaginal mucosa anteriorly was incised, and  bladder was dissected superiorly.  Paracervical tissue was cauterized  incised using the gyrus.  The vesicouterine space was identified and  entered  sharply, and retractors were put in place using cautery  incision.  The parametrium was serially separated from the sides of the  uterus. The uterus was then flipped.  Remaining pedicles were clamped  and cut.  Uterus was passed off the operative field and sent to  pathology.  Pedicles secured with free tie of 0 Vicryl.  Areas of  bleeding brought under control with figure-of-eights of 0 Vicryl.  The  uterosacral plication stitch of 0 Vicryl was put in place and secured.  Vaginal mucosa was brought together in midline with interrupted figure-  of-eights of 0 Monocryl.  Foley was placed to straight drain with clear  urine.   The patient's legs were repositioned.  The abdomen was reinflated of its  carbon dioxide.  Visualization revealed some oozing at the vaginal cuff,  brought under control with cautery.  At this point in time, we  thoroughly irrigated the pelvis, had excellent  hemostasis.  Both ovaries  were hemostatically intact.  The abdomen was deflated of carbon dioxide.  All trocars were removed.  Subumbilical fascia was closed with 2 figure-  of-eights of 0 Vicryl.  Skin was closed with interrupted subcuticulars  of 4-0 Vicryl, and the suprapubic incision was closed with Dermabond.  Urine output was clear and adequate.  Sponge, instrument and needle  count was reported as correct by the circulating nurse x2.  The patient  tolerated the procedure well and once extubated transferred to recovery  room in good condition.      Juluis Mire, M.D.  Electronically Signed     JSM/MEDQ  D:  10/07/2006  T:  10/07/2006  Job:  [id number]

## 2011-03-19 ENCOUNTER — Emergency Department (HOSPITAL_COMMUNITY): Payer: Self-pay

## 2011-03-19 ENCOUNTER — Emergency Department (HOSPITAL_COMMUNITY)
Admission: EM | Admit: 2011-03-19 | Discharge: 2011-03-19 | Disposition: A | Discharge status: Home / Self Care | Payer: Self-pay | Attending: Emergency Medicine | Admitting: Emergency Medicine

## 2011-03-19 DIAGNOSIS — K219 Gastro-esophageal reflux disease without esophagitis: Secondary | ICD-10-CM | POA: Insufficient documentation

## 2011-03-19 DIAGNOSIS — I10 Essential (primary) hypertension: Secondary | ICD-10-CM | POA: Insufficient documentation

## 2011-03-19 DIAGNOSIS — F17200 Nicotine dependence, unspecified, uncomplicated: Secondary | ICD-10-CM | POA: Insufficient documentation

## 2011-03-19 DIAGNOSIS — R51 Headache: Secondary | ICD-10-CM | POA: Insufficient documentation

## 2011-03-19 DIAGNOSIS — F319 Bipolar disorder, unspecified: Secondary | ICD-10-CM | POA: Insufficient documentation

## 2011-03-19 DIAGNOSIS — F411 Generalized anxiety disorder: Secondary | ICD-10-CM | POA: Insufficient documentation

## 2011-03-19 DIAGNOSIS — Z951 Presence of aortocoronary bypass graft: Secondary | ICD-10-CM | POA: Insufficient documentation

## 2011-03-19 DIAGNOSIS — R059 Cough, unspecified: Secondary | ICD-10-CM | POA: Insufficient documentation

## 2011-03-19 DIAGNOSIS — J9801 Acute bronchospasm: Secondary | ICD-10-CM | POA: Insufficient documentation

## 2011-03-19 DIAGNOSIS — R05 Cough: Secondary | ICD-10-CM | POA: Insufficient documentation

## 2011-03-19 DIAGNOSIS — I252 Old myocardial infarction: Secondary | ICD-10-CM | POA: Insufficient documentation

## 2011-07-28 LAB — COMPREHENSIVE METABOLIC PANEL
Albumin: 3.6
BUN: 3 — ABNORMAL LOW
Calcium: 9
Chloride: 106
Creatinine, Ser: 0.57
Total Bilirubin: 0.6
Total Protein: 6.8

## 2011-07-28 LAB — POCT CARDIAC MARKERS
CKMB, poc: <1 — ABNORMAL LOW
CKMB, poc: <1 — ABNORMAL LOW
Troponin i, poc: <0.05

## 2011-07-28 LAB — DIFFERENTIAL
Basophils Absolute: 0
Lymphocytes Relative: 29
Lymphs Abs: 2.6
Monocytes Absolute: 0.3
Neutro Abs: 5.9

## 2011-07-28 LAB — CBC
HCT: 40.2
MCHC: 33.5
MCV: 95.4
Platelets: 308
RDW: 16.5 — ABNORMAL HIGH

## 2011-07-30 LAB — POCT I-STAT 3, ART BLOOD GAS (G3+)
Acid-base deficit: 1 mmol/L (ref 0.0–2.0)
Acid-base deficit: 2 mmol/L (ref 0.0–2.0)
Acid-base deficit: 2 mmol/L (ref 0.0–2.0)
Acid-base deficit: 2 mmol/L (ref 0.0–2.0)
Acid-base deficit: 2 mmol/L (ref 0.0–2.0)
Acid-base deficit: 3 mmol/L — ABNORMAL HIGH (ref 0.0–2.0)
Acid-base deficit: 5 mmol/L — ABNORMAL HIGH (ref 0.0–2.0)
Bicarbonate: 20.7 mEq/L (ref 20.0–24.0)
Bicarbonate: 21.8 meq/L (ref 20.0–24.0)
Bicarbonate: 22.5 meq/L (ref 20.0–24.0)
Bicarbonate: 22.5 meq/L (ref 20.0–24.0)
Bicarbonate: 23.7 meq/L (ref 20.0–24.0)
Bicarbonate: 23.8 mEq/L (ref 20.0–24.0)
Bicarbonate: 24.2 mEq/L — ABNORMAL HIGH (ref 20.0–24.0)
Bicarbonate: 25.6 meq/L — ABNORMAL HIGH (ref 20.0–24.0)
O2 Saturation: 100 %
O2 Saturation: 100 %
O2 Saturation: 100 %
O2 Saturation: 82 %
O2 Saturation: 91 %
O2 Saturation: 93 %
O2 Saturation: 94 %
O2 Saturation: 99 %
Patient temperature: 35
Patient temperature: 36.9
Patient temperature: 37.1
Patient temperature: 37.3
TCO2: 22 mmol/L (ref 0–100)
TCO2: 23 mmol/L (ref 0–100)
TCO2: 23 mmol/L (ref 0–100)
TCO2: 24 mmol/L (ref 0–100)
TCO2: 25 mmol/L (ref 0–100)
TCO2: 25 mmol/L (ref 0–100)
TCO2: 26 mmol/L (ref 0–100)
TCO2: 27 mmol/L (ref 0–100)
pCO2 arterial: 33.4 mmHg — ABNORMAL LOW (ref 35.0–45.0)
pCO2 arterial: 34.1 mmHg — ABNORMAL LOW (ref 35.0–45.0)
pCO2 arterial: 38.9 mmHg (ref 35.0–45.0)
pCO2 arterial: 39.1 mmHg (ref 35.0–45.0)
pCO2 arterial: 42.6 mmHg (ref 35.0–45.0)
pCO2 arterial: 42.9 mmHg (ref 35.0–45.0)
pCO2 arterial: 46 mmHg — ABNORMAL HIGH (ref 35.0–45.0)
pCO2 arterial: 46.3 mmHg — ABNORMAL HIGH (ref 35.0–45.0)
pH, Arterial: 7.291 — ABNORMAL LOW (ref 7.350–7.400)
pH, Arterial: 7.332 — ABNORMAL LOW (ref 7.350–7.400)
pH, Arterial: 7.351 (ref 7.350–7.400)
pH, Arterial: 7.354 (ref 7.350–7.400)
pH, Arterial: 7.355 (ref 7.350–7.400)
pH, Arterial: 7.392 (ref 7.350–7.400)
pH, Arterial: 7.419 — ABNORMAL HIGH (ref 7.350–7.400)
pH, Arterial: 7.436 — ABNORMAL HIGH (ref 7.350–7.400)
pO2, Arterial: 109 mmHg — ABNORMAL HIGH (ref 80.0–100.0)
pO2, Arterial: 270 mmHg — ABNORMAL HIGH (ref 80.0–100.0)
pO2, Arterial: 347 mmHg — ABNORMAL HIGH (ref 80.0–100.0)
pO2, Arterial: 378 mmHg — ABNORMAL HIGH (ref 80.0–100.0)
pO2, Arterial: 52 mmHg — ABNORMAL LOW (ref 80.0–100.0)
pO2, Arterial: 60 mmHg — ABNORMAL LOW (ref 80.0–100.0)
pO2, Arterial: 74 mmHg — ABNORMAL LOW (ref 80.0–100.0)
pO2, Arterial: 76 mmHg — ABNORMAL LOW (ref 80.0–100.0)

## 2011-07-30 LAB — CROSSMATCH
ABO/RH(D): O POS
Antibody Screen: NEGATIVE

## 2011-07-30 LAB — BASIC METABOLIC PANEL
BUN: 11 mg/dL (ref 6–23)
BUN: 12 mg/dL (ref 6–23)
BUN: 14 mg/dL (ref 6–23)
BUN: 17 mg/dL (ref 6–23)
BUN: 24 mg/dL — ABNORMAL HIGH (ref 6–23)
BUN: 4 mg/dL — ABNORMAL LOW (ref 6–23)
BUN: 5 mg/dL — ABNORMAL LOW (ref 6–23)
BUN: 7 mg/dL (ref 6–23)
CO2: 25 mEq/L (ref 19–32)
CO2: 25 mEq/L (ref 19–32)
CO2: 25 mEq/L (ref 19–32)
CO2: 25 mEq/L (ref 19–32)
CO2: 26 mEq/L (ref 19–32)
CO2: 26 mEq/L (ref 19–32)
CO2: 27 mEq/L (ref 19–32)
CO2: 27 mEq/L (ref 19–32)
Calcium: 7.3 mg/dL — ABNORMAL LOW (ref 8.4–10.5)
Calcium: 8 mg/dL — ABNORMAL LOW (ref 8.4–10.5)
Calcium: 8.2 mg/dL — ABNORMAL LOW (ref 8.4–10.5)
Calcium: 8.4 mg/dL (ref 8.4–10.5)
Calcium: 8.6 mg/dL (ref 8.4–10.5)
Calcium: 8.6 mg/dL (ref 8.4–10.5)
Calcium: 9 mg/dL (ref 8.4–10.5)
Calcium: 9.2 mg/dL (ref 8.4–10.5)
Chloride: 100 mEq/L (ref 96–112)
Chloride: 102 mEq/L (ref 96–112)
Chloride: 102 mEq/L (ref 96–112)
Chloride: 102 mEq/L (ref 96–112)
Chloride: 104 mEq/L (ref 96–112)
Chloride: 106 mEq/L (ref 96–112)
Chloride: 97 mEq/L (ref 96–112)
Chloride: 99 mEq/L (ref 96–112)
Creatinine, Ser: 0.52 mg/dL (ref 0.4–1.2)
Creatinine, Ser: 0.55 mg/dL (ref 0.4–1.2)
Creatinine, Ser: 0.57 mg/dL (ref 0.4–1.2)
Creatinine, Ser: 0.59 mg/dL (ref 0.4–1.2)
Creatinine, Ser: 0.59 mg/dL (ref 0.4–1.2)
Creatinine, Ser: 0.71 mg/dL (ref 0.4–1.2)
Creatinine, Ser: 0.75 mg/dL (ref 0.4–1.2)
Creatinine, Ser: 0.78 mg/dL (ref 0.4–1.2)
GFR calc Af Amer: >60 mL/min (ref >60)
GFR calc Af Amer: >60 mL/min (ref >60)
GFR calc Af Amer: >60 mL/min (ref >60)
GFR calc Af Amer: >60 mL/min (ref >60)
GFR calc Af Amer: >60 mL/min (ref >60)
GFR calc Af Amer: >60 mL/min (ref >60)
GFR calc Af Amer: >60 mL/min (ref >60)
GFR calc Af Amer: >60 mL/min (ref >60)
GFR calc non Af Amer: >60 mL/min (ref >60)
GFR calc non Af Amer: >60 mL/min (ref >60)
GFR calc non Af Amer: >60 mL/min (ref >60)
GFR calc non Af Amer: >60 mL/min (ref >60)
GFR calc non Af Amer: >60 mL/min (ref >60)
GFR calc non Af Amer: >60 mL/min (ref >60)
GFR calc non Af Amer: >60 mL/min (ref >60)
GFR calc non Af Amer: >60 mL/min (ref >60)
Glucose, Bld: 106 mg/dL — ABNORMAL HIGH (ref 70–99)
Glucose, Bld: 108 mg/dL — ABNORMAL HIGH (ref 70–99)
Glucose, Bld: 114 mg/dL — ABNORMAL HIGH (ref 70–99)
Glucose, Bld: 114 mg/dL — ABNORMAL HIGH (ref 70–99)
Glucose, Bld: 158 mg/dL — ABNORMAL HIGH (ref 70–99)
Glucose, Bld: 205 mg/dL — ABNORMAL HIGH (ref 70–99)
Glucose, Bld: 98 mg/dL (ref 70–99)
Glucose, Bld: 99 mg/dL (ref 70–99)
Potassium: 3.4 mEq/L — ABNORMAL LOW (ref 3.5–5.1)
Potassium: 3.8 mEq/L (ref 3.5–5.1)
Potassium: 3.8 mEq/L (ref 3.5–5.1)
Potassium: 3.9 mEq/L (ref 3.5–5.1)
Potassium: 4 mEq/L (ref 3.5–5.1)
Potassium: 4 mEq/L (ref 3.5–5.1)
Potassium: 4.1 mEq/L (ref 3.5–5.1)
Potassium: 4.3 mEq/L (ref 3.5–5.1)
Sodium: 132 mEq/L — ABNORMAL LOW (ref 135–145)
Sodium: 132 mEq/L — ABNORMAL LOW (ref 135–145)
Sodium: 135 mEq/L (ref 135–145)
Sodium: 135 mEq/L (ref 135–145)
Sodium: 136 mEq/L (ref 135–145)
Sodium: 136 mEq/L (ref 135–145)
Sodium: 137 mEq/L (ref 135–145)
Sodium: 138 mEq/L (ref 135–145)

## 2011-07-30 LAB — URINALYSIS, ROUTINE W REFLEX MICROSCOPIC
Bilirubin Urine: NEGATIVE
Glucose, UA: NEGATIVE mg/dL
Hgb urine dipstick: NEGATIVE
Ketones, ur: NEGATIVE mg/dL
Ketones, ur: NEGATIVE mg/dL
Nitrite: NEGATIVE
Nitrite: NEGATIVE
Protein, ur: NEGATIVE mg/dL
Specific Gravity, Urine: 1.011 (ref 1.005–1.030)
Specific Gravity, Urine: 1.018 (ref 1.005–1.030)
Urobilinogen, UA: 0.2 mg/dL (ref 0.0–1.0)
pH: 6 (ref 5.0–8.0)
pH: 6 (ref 5.0–8.0)

## 2011-07-30 LAB — CBC
HCT: 26.9 % — ABNORMAL LOW (ref 36.0–46.0)
HCT: 27.3 % — ABNORMAL LOW (ref 36.0–46.0)
HCT: 27.7 % — ABNORMAL LOW (ref 36.0–46.0)
HCT: 28.4 % — ABNORMAL LOW (ref 36.0–46.0)
HCT: 28.9 % — ABNORMAL LOW (ref 36.0–46.0)
HCT: 29 % — ABNORMAL LOW (ref 36.0–46.0)
HCT: 29.1 % — ABNORMAL LOW (ref 36.0–46.0)
HCT: 29.2 % — ABNORMAL LOW (ref 36.0–46.0)
HCT: 30.1 % — ABNORMAL LOW (ref 36.0–46.0)
HCT: 39.2 % (ref 36.0–46.0)
HCT: 47.8 % — ABNORMAL HIGH (ref 36.0–46.0)
Hemoglobin: 10 g/dL — ABNORMAL LOW (ref 12.0–15.0)
Hemoglobin: 10.1 g/dL — ABNORMAL LOW (ref 12.0–15.0)
Hemoglobin: 12.9 g/dL (ref 12.0–15.0)
Hemoglobin: 16.1 g/dL — ABNORMAL HIGH (ref 12.0–15.0)
Hemoglobin: 9 g/dL — ABNORMAL LOW (ref 12.0–15.0)
Hemoglobin: 9.3 g/dL — ABNORMAL LOW (ref 12.0–15.0)
Hemoglobin: 9.4 g/dL — ABNORMAL LOW (ref 12.0–15.0)
Hemoglobin: 9.4 g/dL — ABNORMAL LOW (ref 12.0–15.0)
Hemoglobin: 9.5 g/dL — ABNORMAL LOW (ref 12.0–15.0)
Hemoglobin: 9.5 g/dL — ABNORMAL LOW (ref 12.0–15.0)
Hemoglobin: 9.6 g/dL — ABNORMAL LOW (ref 12.0–15.0)
MCHC: 32.6 g/dL (ref 30.0–36.0)
MCHC: 32.6 g/dL (ref 30.0–36.0)
MCHC: 33.1 g/dL (ref 30.0–36.0)
MCHC: 33.4 g/dL (ref 30.0–36.0)
MCHC: 33.4 g/dL (ref 30.0–36.0)
MCHC: 33.5 g/dL (ref 30.0–36.0)
MCHC: 33.5 g/dL (ref 30.0–36.0)
MCHC: 33.7 g/dL (ref 30.0–36.0)
MCHC: 34.3 g/dL (ref 30.0–36.0)
MCHC: 34.3 g/dL (ref 30.0–36.0)
MCV: 93.2 fL (ref 78.0–100.0)
MCV: 94.1 fL (ref 78.0–100.0)
MCV: 94.3 fL (ref 78.0–100.0)
MCV: 94.8 fL (ref 78.0–100.0)
MCV: 95 fL (ref 78.0–100.0)
MCV: 95.2 fL (ref 78.0–100.0)
MCV: 96.5 fL (ref 78.0–100.0)
MCV: 96.8 fL (ref 78.0–100.0)
MCV: 97 fL (ref 78.0–100.0)
Platelets: 161 10*3/uL (ref 150–400)
Platelets: 198 10*3/uL (ref 150–400)
Platelets: 201 10*3/uL (ref 150–400)
Platelets: 202 10*3/uL (ref 150–400)
Platelets: 210 10*3/uL (ref 150–400)
Platelets: 222 10*3/uL (ref 150–400)
Platelets: 285 10*3/uL (ref 150–400)
Platelets: 386 10*3/uL (ref 150–400)
Platelets: 449 10*3/uL — ABNORMAL HIGH (ref 150–400)
RBC: 2.84 MIL/uL — ABNORMAL LOW (ref 3.87–5.11)
RBC: 2.93 MIL/uL — ABNORMAL LOW (ref 3.87–5.11)
RBC: 2.94 MIL/uL — ABNORMAL LOW (ref 3.87–5.11)
RBC: 2.98 MIL/uL — ABNORMAL LOW (ref 3.87–5.11)
RBC: 2.99 MIL/uL — ABNORMAL LOW (ref 3.87–5.11)
RBC: 3.01 MIL/uL — ABNORMAL LOW (ref 3.87–5.11)
RBC: 3.02 MIL/uL — ABNORMAL LOW (ref 3.87–5.11)
RBC: 3.09 MIL/uL — ABNORMAL LOW (ref 3.87–5.11)
RBC: 3.17 MIL/uL — ABNORMAL LOW (ref 3.87–5.11)
RBC: 4.07 MIL/uL (ref 3.87–5.11)
RBC: 4.42 MIL/uL (ref 3.87–5.11)
RDW: 15.5 % (ref 11.5–15.5)
RDW: 15.8 % — ABNORMAL HIGH (ref 11.5–15.5)
RDW: 15.9 % — ABNORMAL HIGH (ref 11.5–15.5)
RDW: 16 % — ABNORMAL HIGH (ref 11.5–15.5)
RDW: 16.1 % — ABNORMAL HIGH (ref 11.5–15.5)
RDW: 16.3 % — ABNORMAL HIGH (ref 11.5–15.5)
RDW: 16.4 % — ABNORMAL HIGH (ref 11.5–15.5)
RDW: 16.6 % — ABNORMAL HIGH (ref 11.5–15.5)
RDW: 16.7 % — ABNORMAL HIGH (ref 11.5–15.5)
WBC: 11 10*3/uL — ABNORMAL HIGH (ref 4.0–10.5)
WBC: 11.2 10*3/uL — ABNORMAL HIGH (ref 4.0–10.5)
WBC: 11.2 10*3/uL — ABNORMAL HIGH (ref 4.0–10.5)
WBC: 11.8 10*3/uL — ABNORMAL HIGH (ref 4.0–10.5)
WBC: 11.9 10*3/uL — ABNORMAL HIGH (ref 4.0–10.5)
WBC: 12.4 10*3/uL — ABNORMAL HIGH (ref 4.0–10.5)
WBC: 12.6 10*3/uL — ABNORMAL HIGH (ref 4.0–10.5)
WBC: 15.1 10*3/uL — ABNORMAL HIGH (ref 4.0–10.5)
WBC: 15.3 10*3/uL — ABNORMAL HIGH (ref 4.0–10.5)
WBC: 16.5 10*3/uL — ABNORMAL HIGH (ref 4.0–10.5)
WBC: 17.8 10*3/uL — ABNORMAL HIGH (ref 4.0–10.5)
WBC: 8.5 10*3/uL (ref 4.0–10.5)

## 2011-07-30 LAB — POCT I-STAT, CHEM 8
BUN: 4 mg/dL — ABNORMAL LOW (ref 6–23)
BUN: 5 mg/dL — ABNORMAL LOW (ref 6–23)
BUN: 6 mg/dL (ref 6–23)
Calcium, Ion: 1.05 mmol/L — ABNORMAL LOW (ref 1.12–1.32)
Calcium, Ion: 1.16 mmol/L (ref 1.12–1.32)
Calcium, Ion: 1.17 mmol/L (ref 1.12–1.32)
Chloride: 105 mEq/L (ref 96–112)
Chloride: 99 mEq/L (ref 96–112)
Chloride: 99 mEq/L (ref 96–112)
Creatinine, Ser: 0.6 mg/dL (ref 0.4–1.2)
Creatinine, Ser: 0.7 mg/dL (ref 0.4–1.2)
Creatinine, Ser: 0.8 mg/dL (ref 0.4–1.2)
Glucose, Bld: 110 mg/dL — ABNORMAL HIGH (ref 70–99)
Glucose, Bld: 122 mg/dL — ABNORMAL HIGH (ref 70–99)
Glucose, Bld: 128 mg/dL — ABNORMAL HIGH (ref 70–99)
HCT: 29 % — ABNORMAL LOW (ref 36.0–46.0)
HCT: 30 % — ABNORMAL LOW (ref 36.0–46.0)
HCT: 53 % — ABNORMAL HIGH (ref 36.0–46.0)
Hemoglobin: 10.2 g/dL — ABNORMAL LOW (ref 12.0–15.0)
Hemoglobin: 18 g/dL — ABNORMAL HIGH (ref 12.0–15.0)
Hemoglobin: 9.9 g/dL — ABNORMAL LOW (ref 12.0–15.0)
Potassium: 3.9 meq/L (ref 3.5–5.1)
Potassium: 4 meq/L (ref 3.5–5.1)
Potassium: 4.3 meq/L (ref 3.5–5.1)
Sodium: 136 meq/L (ref 135–145)
Sodium: 136 meq/L (ref 135–145)
Sodium: 140 meq/L (ref 135–145)
TCO2: 25 mmol/L (ref 0–100)
TCO2: 25 mmol/L (ref 0–100)
TCO2: 27 mmol/L (ref 0–100)

## 2011-07-30 LAB — HEMOGLOBIN AND HEMATOCRIT, BLOOD
HCT: 24 % — ABNORMAL LOW (ref 36.0–46.0)
Hemoglobin: 8 g/dL — ABNORMAL LOW (ref 12.0–15.0)

## 2011-07-30 LAB — POCT I-STAT 4, (NA,K, GLUC, HGB,HCT)
Glucose, Bld: 112 mg/dL — ABNORMAL HIGH (ref 70–99)
Glucose, Bld: 119 mg/dL — ABNORMAL HIGH (ref 70–99)
Glucose, Bld: 120 mg/dL — ABNORMAL HIGH (ref 70–99)
Glucose, Bld: 121 mg/dL — ABNORMAL HIGH (ref 70–99)
Glucose, Bld: 136 mg/dL — ABNORMAL HIGH (ref 70–99)
Glucose, Bld: 147 mg/dL — ABNORMAL HIGH (ref 70–99)
Glucose, Bld: 84 mg/dL (ref 70–99)
Glucose, Bld: 88 mg/dL (ref 70–99)
HCT: 18 % — ABNORMAL LOW (ref 36.0–46.0)
HCT: 18 % — ABNORMAL LOW (ref 36.0–46.0)
HCT: 23 % — ABNORMAL LOW (ref 36.0–46.0)
HCT: 24 % — ABNORMAL LOW (ref 36.0–46.0)
HCT: 24 % — ABNORMAL LOW (ref 36.0–46.0)
HCT: 27 % — ABNORMAL LOW (ref 36.0–46.0)
HCT: 32 % — ABNORMAL LOW (ref 36.0–46.0)
HCT: 32 % — ABNORMAL LOW (ref 36.0–46.0)
Hemoglobin: 10.9 g/dL — ABNORMAL LOW (ref 12.0–15.0)
Hemoglobin: 10.9 g/dL — ABNORMAL LOW (ref 12.0–15.0)
Hemoglobin: 6.1 g/dL — CL (ref 12.0–15.0)
Hemoglobin: 6.1 g/dL — CL (ref 12.0–15.0)
Hemoglobin: 7.8 g/dL — CL (ref 12.0–15.0)
Hemoglobin: 8.2 g/dL — ABNORMAL LOW (ref 12.0–15.0)
Hemoglobin: 8.2 g/dL — ABNORMAL LOW (ref 12.0–15.0)
Hemoglobin: 9.2 g/dL — ABNORMAL LOW (ref 12.0–15.0)
Potassium: 3.3 mEq/L — ABNORMAL LOW (ref 3.5–5.1)
Potassium: 3.8 meq/L (ref 3.5–5.1)
Potassium: 3.9 mEq/L (ref 3.5–5.1)
Potassium: 4.1 meq/L (ref 3.5–5.1)
Potassium: 4.2 mEq/L (ref 3.5–5.1)
Potassium: 4.5 mEq/L (ref 3.5–5.1)
Potassium: 4.7 mEq/L (ref 3.5–5.1)
Potassium: 4.8 meq/L (ref 3.5–5.1)
Sodium: 130 meq/L — ABNORMAL LOW (ref 135–145)
Sodium: 132 mEq/L — ABNORMAL LOW (ref 135–145)
Sodium: 133 mEq/L — ABNORMAL LOW (ref 135–145)
Sodium: 135 meq/L (ref 135–145)
Sodium: 135 meq/L (ref 135–145)
Sodium: 136 mEq/L (ref 135–145)
Sodium: 136 meq/L (ref 135–145)
Sodium: 139 mEq/L (ref 135–145)

## 2011-07-30 LAB — DIFFERENTIAL
Eosinophils Relative: 1 % (ref 0–5)
Eosinophils Relative: 1 % (ref 0–5)
Lymphocytes Relative: 23 % (ref 12–46)
Lymphocytes Relative: 25 % (ref 12–46)
Lymphs Abs: 3 10*3/uL (ref 0.7–4.0)
Lymphs Abs: 4 10*3/uL (ref 0.7–4.0)
Monocytes Absolute: 0.2 10*3/uL (ref 0.1–1.0)
Monocytes Absolute: 0.6 10*3/uL (ref 0.1–1.0)

## 2011-07-30 LAB — LIPID PANEL
HDL: 34 mg/dL — ABNORMAL LOW (ref >39)
Total CHOL/HDL Ratio: 6 RATIO
VLDL: 39 mg/dL (ref 0–40)

## 2011-07-30 LAB — GLUCOSE, CAPILLARY
Glucose-Capillary: 101 mg/dL — ABNORMAL HIGH (ref 70–99)
Glucose-Capillary: 109 mg/dL — ABNORMAL HIGH (ref 70–99)
Glucose-Capillary: 113 mg/dL — ABNORMAL HIGH (ref 70–99)
Glucose-Capillary: 118 mg/dL — ABNORMAL HIGH (ref 70–99)
Glucose-Capillary: 122 mg/dL — ABNORMAL HIGH (ref 70–99)
Glucose-Capillary: 123 mg/dL — ABNORMAL HIGH (ref 70–99)
Glucose-Capillary: 131 mg/dL — ABNORMAL HIGH (ref 70–99)
Glucose-Capillary: 132 mg/dL — ABNORMAL HIGH (ref 70–99)
Glucose-Capillary: 142 mg/dL — ABNORMAL HIGH (ref 70–99)
Glucose-Capillary: 154 mg/dL — ABNORMAL HIGH (ref 70–99)
Glucose-Capillary: 162 mg/dL — ABNORMAL HIGH (ref 70–99)
Glucose-Capillary: 56 mg/dL — ABNORMAL LOW (ref 70–99)
Glucose-Capillary: 89 mg/dL (ref 70–99)
Glucose-Capillary: 91 mg/dL (ref 70–99)
Glucose-Capillary: 98 mg/dL (ref 70–99)

## 2011-07-30 LAB — URINE CULTURE: Colony Count: >=100000

## 2011-07-30 LAB — TSH: TSH: 2.663 u[IU]/mL (ref 0.350–4.500)

## 2011-07-30 LAB — CK TOTAL AND CKMB (NOT AT ARMC)
CK, MB: 17.1 ng/mL — ABNORMAL HIGH (ref 0.3–4.0)
CK, MB: 17.9 ng/mL — ABNORMAL HIGH (ref 0.3–4.0)
Relative Index: 11.4 — ABNORMAL HIGH (ref 0.0–2.5)
Relative Index: INVALID (ref 0.0–2.5)
Total CK: 131 U/L (ref 7–177)
Total CK: 150 U/L (ref 7–177)
Total CK: 89 U/L (ref 7–177)

## 2011-07-30 LAB — BLOOD GAS, ARTERIAL
Acid-base deficit: 1.6 mmol/L (ref 0.0–2.0)
Bicarbonate: 22.6 mEq/L (ref 20.0–24.0)
O2 Content: 2 L/min
O2 Saturation: 98.2 %
Patient temperature: 98.6
TCO2: 23.8 mmol/L (ref 0–100)
pCO2 arterial: 38 mmHg (ref 35.0–45.0)
pH, Arterial: 7.392 (ref 7.350–7.400)
pO2, Arterial: 114 mmHg — ABNORMAL HIGH (ref 80.0–100.0)

## 2011-07-30 LAB — CREATININE, SERUM
Creatinine, Ser: 0.52 mg/dL (ref 0.4–1.2)
Creatinine, Ser: 0.63 mg/dL (ref 0.4–1.2)
GFR calc Af Amer: >60 mL/min (ref >60)
GFR calc Af Amer: >60 mL/min (ref >60)
GFR calc non Af Amer: >60 mL/min (ref >60)
GFR calc non Af Amer: >60 mL/min (ref >60)

## 2011-07-30 LAB — MAGNESIUM
Magnesium: 2 mg/dL (ref 1.5–2.5)
Magnesium: 2.3 mg/dL (ref 1.5–2.5)
Magnesium: 2.4 mg/dL (ref 1.5–2.5)
Magnesium: 2.8 mg/dL — ABNORMAL HIGH (ref 1.5–2.5)

## 2011-07-30 LAB — TROPONIN I: Troponin I: 1.43 ng/mL (ref 0.00–0.06)

## 2011-07-30 LAB — COMPREHENSIVE METABOLIC PANEL
ALT: 25 U/L (ref 0–35)
Calcium: 8.9 mg/dL (ref 8.4–10.5)
Creatinine, Ser: 0.65 mg/dL (ref 0.4–1.2)
Glucose, Bld: 97 mg/dL (ref 70–99)
Sodium: 137 mEq/L (ref 135–145)
Total Protein: 6.2 g/dL (ref 6.0–8.3)

## 2011-07-30 LAB — ABO/RH: ABO/RH(D): O POS

## 2011-07-30 LAB — SEDIMENTATION RATE: Sed Rate: 57 mm/hr — ABNORMAL HIGH (ref 0–22)

## 2011-07-30 LAB — POCT CARDIAC MARKERS
CKMB, poc: 10.1 ng/mL (ref 1.0–8.0)
Myoglobin, poc: 91.4 ng/mL (ref 12–200)
Troponin i, poc: 0.29 ng/mL (ref 0.00–0.09)

## 2011-07-30 LAB — LIPASE, BLOOD: Lipase: 19 U/L (ref 11–59)

## 2011-07-30 LAB — APTT
aPTT: 28 seconds (ref 24–37)
aPTT: 31 seconds (ref 24–37)
aPTT: 36 seconds (ref 24–37)
aPTT: 70 seconds — ABNORMAL HIGH (ref 24–37)

## 2011-07-30 LAB — PROTIME-INR
INR: 1.5 (ref 0.00–1.49)
Prothrombin Time: 12.5 seconds (ref 11.6–15.2)
Prothrombin Time: 19 seconds — ABNORMAL HIGH (ref 11.6–15.2)

## 2011-07-30 LAB — B-NATRIURETIC PEPTIDE (CONVERTED LAB): Pro B Natriuretic peptide (BNP): 31 pg/mL (ref 0.0–100.0)

## 2011-07-30 LAB — HEMOGLOBIN A1C
Hgb A1c MFr Bld: 5.8 % (ref 4.6–6.1)
Mean Plasma Glucose: 120 mg/dL

## 2011-07-30 LAB — PLATELET COUNT: Platelets: 139 K/uL — ABNORMAL LOW (ref 150–400)

## 2011-07-30 LAB — H. PYLORI ANTIBODY, IGG: H Pylori IgG: 0.5 {ISR}

## 2011-07-30 LAB — PREPARE PLATELET PHERESIS

## 2011-07-30 LAB — HEPARIN LEVEL (UNFRACTIONATED)
Heparin Unfractionated: 0.23 IU/mL — ABNORMAL LOW (ref 0.30–0.70)
Heparin Unfractionated: 0.4 IU/mL (ref 0.30–0.70)
Heparin Unfractionated: 0.58 IU/mL (ref 0.30–0.70)

## 2011-09-18 ENCOUNTER — Other Ambulatory Visit: Payer: Self-pay

## 2011-09-18 ENCOUNTER — Encounter: Payer: Self-pay | Admitting: *Deleted

## 2011-09-18 ENCOUNTER — Observation Stay (HOSPITAL_COMMUNITY)
Admission: EM | Admit: 2011-09-18 | Discharge: 2011-09-19 | Disposition: A | Discharge status: Home / Self Care | Payer: 59 | Attending: Internal Medicine | Admitting: Internal Medicine

## 2011-09-18 ENCOUNTER — Emergency Department (HOSPITAL_COMMUNITY): Payer: 59

## 2011-09-18 DIAGNOSIS — G2581 Restless legs syndrome: Secondary | ICD-10-CM | POA: Insufficient documentation

## 2011-09-18 DIAGNOSIS — K219 Gastro-esophageal reflux disease without esophagitis: Secondary | ICD-10-CM | POA: Insufficient documentation

## 2011-09-18 DIAGNOSIS — I10 Essential (primary) hypertension: Secondary | ICD-10-CM | POA: Insufficient documentation

## 2011-09-18 DIAGNOSIS — F17200 Nicotine dependence, unspecified, uncomplicated: Secondary | ICD-10-CM | POA: Insufficient documentation

## 2011-09-18 DIAGNOSIS — I219 Acute myocardial infarction, unspecified: Secondary | ICD-10-CM

## 2011-09-18 DIAGNOSIS — Z951 Presence of aortocoronary bypass graft: Secondary | ICD-10-CM | POA: Insufficient documentation

## 2011-09-18 DIAGNOSIS — Z720 Tobacco use: Secondary | ICD-10-CM

## 2011-09-18 DIAGNOSIS — R079 Chest pain, unspecified: Principal | ICD-10-CM | POA: Insufficient documentation

## 2011-09-18 DIAGNOSIS — I2510 Atherosclerotic heart disease of native coronary artery without angina pectoris: Secondary | ICD-10-CM | POA: Insufficient documentation

## 2011-09-18 DIAGNOSIS — IMO0001 Reserved for inherently not codable concepts without codable children: Secondary | ICD-10-CM | POA: Insufficient documentation

## 2011-09-18 DIAGNOSIS — I69998 Other sequelae following unspecified cerebrovascular disease: Secondary | ICD-10-CM | POA: Insufficient documentation

## 2011-09-18 DIAGNOSIS — F319 Bipolar disorder, unspecified: Secondary | ICD-10-CM | POA: Insufficient documentation

## 2011-09-18 HISTORY — DX: Cerebral infarction, unspecified: I63.9

## 2011-09-18 HISTORY — DX: Anxiety disorder, unspecified: F41.9

## 2011-09-18 HISTORY — DX: Acute myocardial infarction, unspecified: I21.9

## 2011-09-18 HISTORY — DX: Depression, unspecified: F32.A

## 2011-09-18 HISTORY — DX: Gastro-esophageal reflux disease without esophagitis: K21.9

## 2011-09-18 HISTORY — DX: Fibromyalgia: M79.7

## 2011-09-18 HISTORY — DX: Major depressive disorder, single episode, unspecified: F32.9

## 2011-09-18 HISTORY — DX: Essential (primary) hypertension: I10

## 2011-09-18 HISTORY — DX: Atherosclerotic heart disease of native coronary artery without angina pectoris: I25.10

## 2011-09-18 LAB — CBC
HCT: 42.7 % (ref 36.0–46.0)
HCT: 40.6 % (ref 36.0–46.0)
Hemoglobin: 13.7 g/dL (ref 12.0–15.0)
MCH: 30.6 pg (ref 26.0–34.0)
MCH: 31.1 pg (ref 26.0–34.0)
MCHC: 32.1 g/dL (ref 30.0–36.0)
MCHC: 32.5 g/dL (ref 30.0–36.0)
MCV: 95.5 fL (ref 78.0–100.0)
MCV: 95.5 fL (ref 78.0–100.0)
Platelets: 319 10*3/uL (ref 150–400)
RDW: 13.7 % (ref 11.5–15.5)
RDW: 13.7 % (ref 11.5–15.5)

## 2011-09-18 LAB — HEPATIC FUNCTION PANEL
ALT: 9 U/L (ref 0–35)
AST: 13 U/L (ref 0–37)
Albumin: 3.3 g/dL — ABNORMAL LOW (ref 3.5–5.2)
Alkaline Phosphatase: 89 U/L (ref 39–117)
Total Bilirubin: 0.2 mg/dL — ABNORMAL LOW (ref 0.3–1.2)

## 2011-09-18 LAB — BASIC METABOLIC PANEL
BUN: 7 mg/dL (ref 6–23)
Creatinine, Ser: 0.58 mg/dL (ref 0.50–1.10)
GFR calc Af Amer: >90 mL/min (ref >90)
GFR calc non Af Amer: >90 mL/min (ref >90)
Glucose, Bld: 102 mg/dL — ABNORMAL HIGH (ref 70–99)

## 2011-09-18 LAB — CARDIAC PANEL(CRET KIN+CKTOT+MB+TROPI)
CK, MB: 1.9 ng/mL (ref 0.3–4.0)
Relative Index: INVALID (ref 0.0–2.5)
Total CK: 46 U/L (ref 7–177)

## 2011-09-18 MED ORDER — SODIUM CHLORIDE 0.9 % IJ SOLN: 3.0000 mL | INTRAMUSCULAR | Status: DC | PRN

## 2011-09-18 MED ORDER — ONDANSETRON HCL 4 MG/2ML IJ SOLN: 4.0000 mg | Freq: Four times a day (QID) | INTRAMUSCULAR | Status: DC | PRN

## 2011-09-18 MED ORDER — PANTOPRAZOLE SODIUM 40 MG PO TBEC: 40.0000 mg | DELAYED_RELEASE_TABLET | Freq: Every day | ORAL | Status: DC

## 2011-09-18 MED ORDER — ASPIRIN 81 MG PO CHEW
324.0000 mg | CHEWABLE_TABLET | Freq: Once | ORAL | Status: AC
  Administered 2011-09-18: 324 mg via ORAL
  Filled 2011-09-18: qty 4

## 2011-09-18 MED ORDER — ASPIRIN EC 81 MG PO TBEC
81.0000 mg | DELAYED_RELEASE_TABLET | Freq: Every day | ORAL | Status: DC
  Administered 2011-09-19: 81 mg via ORAL
  Filled 2011-09-18: qty 1

## 2011-09-18 MED ORDER — SODIUM CHLORIDE 0.9 % IJ SOLN
3.0000 mL | Freq: Two times a day (BID) | INTRAMUSCULAR | Status: DC
  Administered 2011-09-18 – 2011-09-19 (×2): 3 mL via INTRAVENOUS

## 2011-09-18 MED ORDER — LISINOPRIL 40 MG PO TABS
40.0000 mg | ORAL_TABLET | Freq: Every day | ORAL | Status: DC
  Administered 2011-09-18 – 2011-09-19 (×2): 40 mg via ORAL
  Filled 2011-09-18 (×2): qty 1

## 2011-09-18 MED ORDER — ROPINIROLE HCL 1 MG PO TABS
1.0000 mg | ORAL_TABLET | Freq: Every day | ORAL | Status: DC
  Administered 2011-09-18: 1 mg via ORAL
  Filled 2011-09-18 (×2): qty 1

## 2011-09-18 MED ORDER — FLUOXETINE HCL 20 MG PO CAPS
40.0000 mg | ORAL_CAPSULE | Freq: Every day | ORAL | Status: DC
  Administered 2011-09-18 – 2011-09-19 (×2): 40 mg via ORAL
  Filled 2011-09-18 (×2): qty 2

## 2011-09-18 MED ORDER — NITROGLYCERIN 0.4 MG SL SUBL
0.4000 mg | SUBLINGUAL_TABLET | SUBLINGUAL | Status: DC | PRN
  Administered 2011-09-18 (×2): 0.4 mg via SUBLINGUAL
  Filled 2011-09-18: qty 25
  Filled 2011-09-18: qty 50

## 2011-09-18 MED ORDER — ALPRAZOLAM 0.5 MG PO TABS: 0.5000 mg | ORAL_TABLET | Freq: Three times a day (TID) | ORAL | Status: DC | PRN

## 2011-09-18 MED ORDER — ASPIRIN 300 MG RE SUPP
300.0000 mg | RECTAL | Status: DC
  Filled 2011-09-18: qty 1

## 2011-09-18 MED ORDER — ARIPIPRAZOLE 10 MG PO TABS
10.0000 mg | ORAL_TABLET | Freq: Every day | ORAL | Status: DC
  Administered 2011-09-18 – 2011-09-19 (×2): 10 mg via ORAL
  Filled 2011-09-18 (×2): qty 1

## 2011-09-18 MED ORDER — PANTOPRAZOLE SODIUM 40 MG PO TBEC: 80.0000 mg | DELAYED_RELEASE_TABLET | Freq: Every day | ORAL | Status: DC

## 2011-09-18 MED ORDER — CARVEDILOL 25 MG PO TABS
25.0000 mg | ORAL_TABLET | Freq: Every day | ORAL | Status: DC
  Administered 2011-09-18 – 2011-09-19 (×2): 25 mg via ORAL
  Filled 2011-09-18 (×2): qty 1

## 2011-09-18 MED ORDER — ACETAMINOPHEN 325 MG PO TABS: 650.0000 mg | ORAL_TABLET | ORAL | Status: DC | PRN

## 2011-09-18 MED ORDER — SIMVASTATIN 10 MG PO TABS
10.0000 mg | ORAL_TABLET | Freq: Every day | ORAL | Status: DC
  Administered 2011-09-18: 10 mg via ORAL
  Filled 2011-09-18 (×2): qty 1

## 2011-09-18 MED ORDER — ATOMOXETINE HCL 60 MG PO CAPS
60.0000 mg | ORAL_CAPSULE | Freq: Every day | ORAL | Status: DC
  Administered 2011-09-18 – 2011-09-19 (×2): 60 mg via ORAL
  Filled 2011-09-18 (×2): qty 1

## 2011-09-18 MED ORDER — CYCLOBENZAPRINE HCL 10 MG PO TABS
10.0000 mg | ORAL_TABLET | Freq: Every evening | ORAL | Status: DC | PRN
  Filled 2011-09-18: qty 1

## 2011-09-18 MED ORDER — ASPIRIN 81 MG PO CHEW: 324.0000 mg | CHEWABLE_TABLET | ORAL | Status: DC

## 2011-09-18 MED ORDER — ENOXAPARIN SODIUM 40 MG/0.4ML ~~LOC~~ SOLN
40.0000 mg | SUBCUTANEOUS | Status: DC
  Administered 2011-09-18: 40 mg via SUBCUTANEOUS
  Filled 2011-09-18 (×3): qty 0.4

## 2011-09-18 NOTE — ED Notes (Addendum)
Gave report to [place], RN ([facility]) and patient to move to [facility] when [facility] come back.

## 2011-09-18 NOTE — ED Notes (Signed)
Placed cardiac diet per Dr. Preston Fleeting and patient moved to [facility].

## 2011-09-18 NOTE — ED Notes (Signed)
Per Dr. Preston Fleeting patient can be moved to [facility] or [facility] to wait for placement one I-Stat Troponin I.

## 2011-09-18 NOTE — ED Notes (Signed)
Patient resting with family at bedside.

## 2011-09-18 NOTE — ED Notes (Signed)
Called for i-Stat troponin I to be drawn.

## 2011-09-18 NOTE — ED Notes (Signed)
Continues to rest quietly in bed. Denies pain. NAD noted. Awaiting admission.

## 2011-09-18 NOTE — ED Provider Notes (Signed)
History     CSN: [id number] Arrival date & time: 09/18/2011  9:51 AM   First MD Initiated Contact with Patient 09/18/11 1010      Chief Complaint  Patient presents with  . Chest Pain    (Consider location/radiation/quality/duration/timing/severity/associated sxs/prior treatment) Patient is a 45 y.o. female presenting with chest pain. The history is provided by the patient.  Chest Pain    she had onset yesterday at 5:30 PM of a heavy feeling in her upper chest which he thought may have been heartburn. She took a dose of omeprazole, but pain did not get better. She went to sleep at 8:30 PM. This morning when she got up and got ready to go to work, the same pain recurred. She took another dose of omeprazole with no relief. When she drank her coffee, she states it felt like it was getting stuck in her mid chest. She ate a honey bun to see the would push whatever it was down to, and that did not seem to give her any relief. She rates her pain at 5/10 currently but it has been as severe as 8/10. There is associated mild nausea, mild dyspnea, but no diaphoresis. Nothing seems to make his pain better and nothing seems to make it worse. She had three-vessel coronary artery bypass surgery 3 years ago, and states that when she is having now is somewhat similar to what she had prior to her bypass surgery. She is still smoking, admitting to smoking a half pack of cigarettes a day.  Past Medical History  Diagnosis Date  . Stroke 2010  . Coronary artery disease     No past surgical history on file.  No family history on file.  History  Substance Use Topics  . Smoking status: Current Everyday Smoker -- 0.5 packs/day  . Smokeless tobacco: Not on file  . Alcohol Use: No    OB History    Grav Para Term Preterm Abortions TAB SAB Ect Mult Living                  Review of Systems  Cardiovascular: Positive for chest pain.  All other systems reviewed and are negative.    Allergies  Review  of patient's allergies indicates not on file.  Home Medications  No current outpatient prescriptions on file.  BP 110/70  Pulse 72  Temp(Src) 97.7 F (36.5 C) (Oral)  Resp 20  SpO2 98%  Physical Exam  Nursing note and vitals reviewed.  46 year old female who is resting comfortably and in no acute distress. Vital signs are normal. Head is normocephalic and atraumatic. PERRLA, EOMI. Oropharynx is clear. Neck is supple without adenopathy or JVD. Back is nontender. Lungs are clear without rales, wheezes, or rhonchi. Heart has regular rate and rhythm without murmur. There is no chest wall tenderness. Abdomen is soft, flat, nontender without masses or hepatosplenomegaly peristalsis is present. Extremities have no cyanosis or edema. Neurologic: Mental status is normal, cranial nerves are intact, there no motor or sensory deficits. Psychiatric: No abnormalities of mood or affect.  ED Course  Procedures (including critical care time)   Labs Reviewed  CBC  BASIC METABOLIC PANEL   No results found.  Results for orders placed during the hospital encounter of 09/18/11  CBC      Component Value Range   WBC 9.2  4.0 - 10.5 (K/uL)   RBC 4.47  3.87 - 5.11 (MIL/uL)   Hemoglobin 13.7  12.0 - 15.0 (g/dL)   HCT  42.7  36.0 - 46.0 (%)   MCV 95.5  78.0 - 100.0 (fL)   MCH 30.6  26.0 - 34.0 (pg)   MCHC 32.1  30.0 - 36.0 (g/dL)   RDW 16.1  09.6 - 04.5 (%)   Platelets 309  150 - 400 (K/uL)  BASIC METABOLIC PANEL      Component Value Range   Sodium 135  135 - 145 (mEq/L)   Potassium 3.7  3.5 - 5.1 (mEq/L)   Chloride 102  96 - 112 (mEq/L)   CO2 24  19 - 32 (mEq/L)   Glucose, Bld 102 (*) 70 - 99 (mg/dL)   BUN 7  6 - 23 (mg/dL)   Creatinine, Ser 4.09  0.50 - 1.10 (mg/dL)   Calcium 9.3  8.4 - 81.1 (mg/dL)   GFR calc non Af Amer >90  >90 (mL/min)   GFR calc Af Amer >90  >90 (mL/min)  POCT I-STAT TROPONIN I      Component Value Range   Troponin i, poc 0.00  0.00 - 0.08 (ng/mL)   Comment 3              Dg Chest Portable 1 View  09/18/2011  *RADIOLOGY REPORT*  Clinical Data: Chest pain.  Coronary artery disease and hypertension.  Smoker.  PORTABLE CHEST - 1 VIEW  Comparison: 03/19/2011  Findings: Heart size appears normal.  No pleural effusion or pulmonary edema identified.  No airspace consolidation identified.  Increased lung volumes and coarsened interstitial markings are noted.  IMPRESSION:  1.  No active cardiopulmonary abnormalities.  Original Report Authenticated By: Rosealee Albee, M.D.     No diagnosis found.   Date: 09/18/2011  Rate: 71  Rhythm: normal sinus rhythm  QRS Axis: right  Intervals: normal  ST/T Wave abnormalities: normal  Conduction Disutrbances:none  Narrative Interpretation: Right atrial enlargement. No change from ECG of 12/03/2009  Old EKG Reviewed: unchanged   Chest pain was completely relieved with sublingual nitroglycerin glycerin. ED workup has been negative. I have discussed case with [facility] hospitalist who agrees to admit the patient for further evaluation. MDM  Chest pain worrisome for cardiac etiology. Workup was initiated.        Dione Booze, MD 09/18/11 [phone]
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
## 2011-09-18 NOTE — ED Notes (Signed)
[facility] labs drawn when IV was placed @ 1045

## 2011-09-18 NOTE — ED Notes (Addendum)
Patient states she was driving to work drinking coffee and she started feeling burning in mid upper chest and once at work patient states she started to feel weak and nausea. Patient denies vomiting. Patient states she sat in the office at her job for a few minutes then called her husband. Husband brought her a teaspoon of lidocaine to help with the burning feeling. Patient states she had a severe headache that started around 530pm yesterday.

## 2011-09-18 NOTE — ED Notes (Signed)
Patient resting with husband at bedside and Diet Cardiac Tray ordered to room [facility].

## 2011-09-18 NOTE — ED Notes (Signed)
Patient sleeping, with NAD at this time. Husband out of room at this time.

## 2011-09-18 NOTE — ED Notes (Signed)
Called to give report. Spoke with Melissa. She stated nurse was unaware she was getting patient and needed 15 minutes. Awaiting return call.

## 2011-09-18 NOTE — ED Notes (Signed)
Gave old and new ECG to Dr. Preston Fleeting after I performed. 10:09am JG

## 2011-09-19 ENCOUNTER — Other Ambulatory Visit: Payer: Self-pay

## 2011-09-19 LAB — CARDIAC PANEL(CRET KIN+CKTOT+MB+TROPI)
CK, MB: 1.9 ng/mL (ref 0.3–4.0)
Total CK: 44 U/L (ref 7–177)

## 2011-09-19 LAB — MAGNESIUM: Magnesium: 2 mg/dL (ref 1.5–2.5)

## 2011-09-19 MED ORDER — ASPIRIN 81 MG PO TBEC: 81.0000 mg | DELAYED_RELEASE_TABLET | Freq: Every day | ORAL | Status: DC

## 2011-09-19 NOTE — Progress Notes (Signed)
Discharge review done with patient.  She acknowledged understanding of information provided.  Patient is stable; discharged home with significant other.  Lethea Killings, RN

## 2011-09-19 NOTE — Discharge Summary (Signed)
Physician Discharge Summary  Patient ID: [name] MRN: [id number] DOB/AGE: 06/20/1966 45 y.o.  Admit date: 09/18/2011 Discharge date: 09/19/2011  Primary Care Physician:  Samuel Jester, DO, DO   Discharge Diagnoses:    Principal Problem:  *Chest pain Active Problems:  Tobacco abuse  bipolar disorder Restless leg syndrome Hypertension History of CVA in 2010 Fibromyalgia GERD   Current Discharge Medication List    START taking these medications   Details  aspirin EC 81 MG EC tablet Take 1 tablet (81 mg total) by mouth daily. Qty: 30 tablet, Refills: 11      CONTINUE these medications which have NOT CHANGED   Details  ALPRAZolam (XANAX) 0.5 MG tablet Take 0.5 mg by mouth 3 (three) times daily as needed.      ARIPiprazole (ABILIFY) 10 MG tablet Take 10 mg by mouth daily.      atomoxetine (STRATTERA) 60 MG capsule Take 60 mg by mouth daily.      carvedilol (COREG) 25 MG tablet Take 25 mg by mouth daily at 6 (six) AM.      cyclobenzaprine (FLEXERIL) 10 MG tablet Take 10 mg by mouth at bedtime as needed. For fibromyalgia     FLUoxetine (PROZAC) 40 MG capsule Take 40 mg by mouth daily.      lisinopril (PRINIVIL,ZESTRIL) 40 MG tablet Take 40 mg by mouth daily.      omeprazole (PRILOSEC) 20 MG capsule Take 20 mg by mouth daily.      pravastatin (PRAVACHOL) 40 MG tablet Take 40 mg by mouth daily.      rOPINIRole (REQUIP) 1 MG tablet Take 1 mg by mouth at bedtime.           Disposition and Follow-up:  Patient will be discharged home today in stable and improved condition. She has been instructed to followup with Dr. Dietrich Pates in 2 weeks for potential outpatient stress test.  Consults:  none    Significant Diagnostic Studies:  Dg Chest Portable 1 View  09/18/2011  *RADIOLOGY REPORT*  Clinical Data: Chest pain.  Coronary artery disease and hypertension.  Smoker.  PORTABLE CHEST - 1 VIEW  Comparison: 03/19/2011  Findings: Heart size appears normal.  No pleural  effusion or pulmonary edema identified.  No airspace consolidation identified.  Increased lung volumes and coarsened interstitial markings are noted.  IMPRESSION:  1.  No active cardiopulmonary abnormalities.  Original Report Authenticated By: Rosealee Albee, M.D.     Brief H and P: For complete details please refer to admission H and P, but in brief patient is a 45 year old woman with history of tobacco abuse and hypertension as well as coronary artery disease status post triple-vessel bypass in December 2009 who presented to the hospital with complaints of chest pain. Because of this we are asked to admit her for further evaluation and management.    Hospital Course:  Principal Problem:  *Chest pain Active Problems:  Tobacco abuse   #1 chest pain: She is now ruled out for acute coronary syndrome with negative cardiac enzymes and EKGs that demonstrate no acute ischemic changes. She states her last stress test was about 2-1/2 years ago, it is probably time for repeat stress test, do not believe this needs to be done inpatient given the atypical nature of her chest pain and the fact that she is ruled out. I have asked her to followup with her cardiologist Dr. Dietrich Pates in about 2 weeks.   #2 tobacco abuse: She has been counseled on tobacco cessation. She is  still smoking despite her heart attack and stroke at her young age. She is not interested in quitting at this time.  Time spent on Discharge: Greater than 30 minutes  Signed: HERNANDEZ ACOSTA,[place] 09/19/2011, 9:31 AM

## 2011-09-22 NOTE — Progress Notes (Signed)
09/22/2011 Amarilys Lyles [facility] Case Management Note [phone]        Utilization review completed.  

## 2011-10-07 ENCOUNTER — Encounter: Payer: Self-pay | Admitting: Cardiology

## 2012-02-29 ENCOUNTER — Encounter (HOSPITAL_COMMUNITY): Payer: Self-pay | Admitting: Emergency Medicine

## 2012-02-29 ENCOUNTER — Observation Stay (HOSPITAL_COMMUNITY)
Admission: EM | Admit: 2012-02-29 | Discharge: 2012-03-01 | Disposition: A | Discharge status: Home / Self Care | Payer: Self-pay | Attending: Internal Medicine | Admitting: Internal Medicine

## 2012-02-29 DIAGNOSIS — R079 Chest pain, unspecified: Principal | ICD-10-CM | POA: Insufficient documentation

## 2012-02-29 DIAGNOSIS — IMO0001 Reserved for inherently not codable concepts without codable children: Secondary | ICD-10-CM | POA: Insufficient documentation

## 2012-02-29 DIAGNOSIS — G2581 Restless legs syndrome: Secondary | ICD-10-CM

## 2012-02-29 DIAGNOSIS — Z8673 Personal history of transient ischemic attack (TIA), and cerebral infarction without residual deficits: Secondary | ICD-10-CM | POA: Insufficient documentation

## 2012-02-29 DIAGNOSIS — I2510 Atherosclerotic heart disease of native coronary artery without angina pectoris: Secondary | ICD-10-CM | POA: Insufficient documentation

## 2012-02-29 DIAGNOSIS — E876 Hypokalemia: Secondary | ICD-10-CM

## 2012-02-29 DIAGNOSIS — Z720 Tobacco use: Secondary | ICD-10-CM

## 2012-02-29 DIAGNOSIS — F319 Bipolar disorder, unspecified: Secondary | ICD-10-CM | POA: Insufficient documentation

## 2012-02-29 DIAGNOSIS — K219 Gastro-esophageal reflux disease without esophagitis: Secondary | ICD-10-CM | POA: Insufficient documentation

## 2012-02-29 DIAGNOSIS — I679 Cerebrovascular disease, unspecified: Secondary | ICD-10-CM

## 2012-02-29 DIAGNOSIS — F17200 Nicotine dependence, unspecified, uncomplicated: Secondary | ICD-10-CM | POA: Insufficient documentation

## 2012-02-29 DIAGNOSIS — R0602 Shortness of breath: Secondary | ICD-10-CM | POA: Insufficient documentation

## 2012-02-29 DIAGNOSIS — E785 Hyperlipidemia, unspecified: Secondary | ICD-10-CM | POA: Insufficient documentation

## 2012-02-29 DIAGNOSIS — I10 Essential (primary) hypertension: Secondary | ICD-10-CM | POA: Insufficient documentation

## 2012-02-29 HISTORY — DX: Peripheral vascular disease, unspecified: I73.9

## 2012-02-29 HISTORY — DX: Tobacco use: Z72.0

## 2012-02-29 HISTORY — DX: Attention-deficit hyperactivity disorder, unspecified type: F90.9

## 2012-02-29 HISTORY — DX: Hyperlipidemia, unspecified: E78.5

## 2012-02-29 HISTORY — DX: Cerebral infarction, unspecified: I63.9

## 2012-02-29 HISTORY — DX: Disorder of arteries and arterioles, unspecified: I77.9

## 2012-02-29 HISTORY — DX: Bipolar disorder, unspecified: F31.9

## 2012-02-29 MED ORDER — NITROGLYCERIN 0.4 MG SL SUBL
0.4000 mg | SUBLINGUAL_TABLET | SUBLINGUAL | Status: DC | PRN
  Administered 2012-03-01: 0.4 mg via SUBLINGUAL

## 2012-02-29 MED ORDER — ASPIRIN 81 MG PO CHEW
324.0000 mg | CHEWABLE_TABLET | Freq: Once | ORAL | Status: AC
  Administered 2012-03-01: 324 mg via ORAL
  Filled 2012-02-29: qty 4

## 2012-02-29 NOTE — ED Notes (Signed)
Patient with chest pain that started yesterday, with lightheadedness and nausea.  Patient states that it goes to her back.  Patient states she does have some shortness of breath.

## 2012-02-29 NOTE — ED Notes (Signed)
Patient presents with c/o CP that started on Sunday.  Feels like there is something sitting on her chest.  States the pain is intermittent.  +N/slight SOB.

## 2012-03-01 ENCOUNTER — Emergency Department (HOSPITAL_COMMUNITY): Payer: Self-pay

## 2012-03-01 ENCOUNTER — Observation Stay (HOSPITAL_COMMUNITY): Payer: Self-pay

## 2012-03-01 ENCOUNTER — Encounter (HOSPITAL_COMMUNITY): Payer: Self-pay | Admitting: Internal Medicine

## 2012-03-01 DIAGNOSIS — I2510 Atherosclerotic heart disease of native coronary artery without angina pectoris: Secondary | ICD-10-CM

## 2012-03-01 DIAGNOSIS — R072 Precordial pain: Secondary | ICD-10-CM

## 2012-03-01 DIAGNOSIS — F17200 Nicotine dependence, unspecified, uncomplicated: Secondary | ICD-10-CM

## 2012-03-01 DIAGNOSIS — R079 Chest pain, unspecified: Principal | ICD-10-CM

## 2012-03-01 DIAGNOSIS — I10 Essential (primary) hypertension: Secondary | ICD-10-CM

## 2012-03-01 LAB — CARDIAC PANEL(CRET KIN+CKTOT+MB+TROPI)
Relative Index: INVALID (ref 0.0–2.5)
Relative Index: INVALID (ref 0.0–2.5)
Relative Index: INVALID (ref 0.0–2.5)
Total CK: 45 U/L (ref 7–177)
Total CK: 32 U/L (ref 7–177)
Troponin I: <0.3 ng/mL (ref <0.30)
Troponin I: <0.3 ng/mL (ref <0.30)

## 2012-03-01 LAB — CBC
HCT: 41.4 % (ref 36.0–46.0)
Hemoglobin: 12.9 g/dL (ref 12.0–15.0)
Hemoglobin: 13.7 g/dL (ref 12.0–15.0)
MCH: 31.5 pg (ref 26.0–34.0)
MCHC: 33.5 g/dL (ref 30.0–36.0)
MCV: 94.1 fL (ref 78.0–100.0)
RBC: 4.09 MIL/uL (ref 3.87–5.11)
WBC: 10.1 10*3/uL (ref 4.0–10.5)

## 2012-03-01 LAB — COMPREHENSIVE METABOLIC PANEL
ALT: 11 U/L (ref 0–35)
AST: 15 U/L (ref 0–37)
Albumin: 2.9 g/dL — ABNORMAL LOW (ref 3.5–5.2)
CO2: 26 mEq/L (ref 19–32)
Calcium: 9.4 mg/dL (ref 8.4–10.5)
Chloride: 101 mEq/L (ref 96–112)
Creatinine, Ser: 0.7 mg/dL (ref 0.50–1.10)
GFR calc non Af Amer: >90 mL/min (ref >90)
Sodium: 136 mEq/L (ref 135–145)

## 2012-03-01 LAB — BASIC METABOLIC PANEL
BUN: 12 mg/dL (ref 6–23)
CO2: 25 mEq/L (ref 19–32)
Chloride: 100 mEq/L (ref 96–112)
Glucose, Bld: 82 mg/dL (ref 70–99)
Potassium: 3.4 mEq/L — ABNORMAL LOW (ref 3.5–5.1)

## 2012-03-01 MED ORDER — ROPINIROLE HCL 1 MG PO TABS
1.0000 mg | ORAL_TABLET | Freq: Every day | ORAL | Status: DC
  Filled 2012-03-01: qty 1

## 2012-03-01 MED ORDER — SIMVASTATIN 20 MG PO TABS
20.0000 mg | ORAL_TABLET | Freq: Every day | ORAL | Status: DC
  Administered 2012-03-01: 20 mg via ORAL
  Filled 2012-03-01: qty 1

## 2012-03-01 MED ORDER — SODIUM CHLORIDE 0.9 % IJ SOLN
3.0000 mL | Freq: Two times a day (BID) | INTRAMUSCULAR | Status: DC
  Administered 2012-03-01: 3 mL via INTRAVENOUS

## 2012-03-01 MED ORDER — CARVEDILOL 25 MG PO TABS
25.0000 mg | ORAL_TABLET | Freq: Every day | ORAL | Status: DC
  Administered 2012-03-01: 25 mg via ORAL
  Filled 2012-03-01 (×3): qty 1

## 2012-03-01 MED ORDER — CYCLOBENZAPRINE HCL 10 MG PO TABS: 10.0000 mg | ORAL_TABLET | Freq: Every evening | ORAL | Status: DC | PRN

## 2012-03-01 MED ORDER — POTASSIUM CHLORIDE CRYS ER 20 MEQ PO TBCR
30.0000 meq | EXTENDED_RELEASE_TABLET | Freq: Once | ORAL | Status: AC
  Administered 2012-03-01: 30 meq via ORAL
  Filled 2012-03-01: qty 2

## 2012-03-01 MED ORDER — NITROGLYCERIN 0.4 MG SL SUBL: 0.4000 mg | SUBLINGUAL_TABLET | SUBLINGUAL | Status: DC | PRN

## 2012-03-01 MED ORDER — IOHEXOL 350 MG/ML SOLN
70.0000 mL | Freq: Once | INTRAVENOUS | Status: AC | PRN
  Administered 2012-03-01: 70 mL via INTRAVENOUS

## 2012-03-01 MED ORDER — ACETAMINOPHEN 325 MG PO TABS
650.0000 mg | ORAL_TABLET | Freq: Four times a day (QID) | ORAL | Status: DC | PRN
  Administered 2012-03-01: 650 mg via ORAL
  Filled 2012-03-01: qty 2

## 2012-03-01 MED ORDER — ONDANSETRON HCL 4 MG/2ML IJ SOLN: 4.0000 mg | Freq: Four times a day (QID) | INTRAMUSCULAR | Status: DC | PRN

## 2012-03-01 MED ORDER — SODIUM CHLORIDE 0.9 % IV SOLN
INTRAVENOUS | Status: DC
  Administered 2012-03-01: 75 mL/h via INTRAVENOUS

## 2012-03-01 MED ORDER — ARIPIPRAZOLE 10 MG PO TABS
10.0000 mg | ORAL_TABLET | Freq: Every day | ORAL | Status: DC
  Administered 2012-03-01: 10 mg via ORAL
  Filled 2012-03-01: qty 1

## 2012-03-01 MED ORDER — LISINOPRIL 40 MG PO TABS
40.0000 mg | ORAL_TABLET | Freq: Every day | ORAL | Status: DC
  Administered 2012-03-01: 40 mg via ORAL
  Filled 2012-03-01: qty 1

## 2012-03-01 MED ORDER — ATOMOXETINE HCL 60 MG PO CAPS
60.0000 mg | ORAL_CAPSULE | Freq: Every day | ORAL | Status: DC
  Administered 2012-03-01: 60 mg via ORAL
  Filled 2012-03-01: qty 1

## 2012-03-01 MED ORDER — ONDANSETRON HCL 4 MG PO TABS: 4.0000 mg | ORAL_TABLET | Freq: Four times a day (QID) | ORAL | Status: DC | PRN

## 2012-03-01 MED ORDER — PANTOPRAZOLE SODIUM 40 MG PO TBEC
40.0000 mg | DELAYED_RELEASE_TABLET | Freq: Every day | ORAL | Status: DC
  Administered 2012-03-01: 40 mg via ORAL
  Filled 2012-03-01: qty 1

## 2012-03-01 MED ORDER — FLUOXETINE HCL 20 MG PO CAPS
40.0000 mg | ORAL_CAPSULE | Freq: Every day | ORAL | Status: DC
  Administered 2012-03-01: 40 mg via ORAL
  Filled 2012-03-01: qty 2

## 2012-03-01 MED ORDER — ACETAMINOPHEN 650 MG RE SUPP: 650.0000 mg | Freq: Four times a day (QID) | RECTAL | Status: DC | PRN

## 2012-03-01 MED ORDER — ENOXAPARIN SODIUM 40 MG/0.4ML ~~LOC~~ SOLN
40.0000 mg | SUBCUTANEOUS | Status: DC
  Administered 2012-03-01: 40 mg via SUBCUTANEOUS
  Filled 2012-03-01: qty 0.4

## 2012-03-01 MED ORDER — ALPRAZOLAM 0.5 MG PO TABS: 0.5000 mg | ORAL_TABLET | Freq: Three times a day (TID) | ORAL | Status: DC | PRN

## 2012-03-01 MED ORDER — ASPIRIN EC 325 MG PO TBEC
325.0000 mg | DELAYED_RELEASE_TABLET | Freq: Every day | ORAL | Status: DC
Start: 2012-03-01 — End: 2012-03-01
  Administered 2012-03-01: 325 mg via ORAL
  Filled 2012-03-01: qty 1

## 2012-03-01 MED ORDER — CARVEDILOL 12.5 MG PO TABS
12.5000 mg | ORAL_TABLET | Freq: Two times a day (BID) | ORAL | Status: DC
  Administered 2012-03-01: 12.5 mg via ORAL
  Filled 2012-03-01 (×2): qty 1

## 2012-03-01 NOTE — H&P (Signed)
[name] is an 46 y.o. female.   PCP - Dr.Butler in [place]. Chief Complaint: Chest pain. HPI: 46 year-old female with history of CAD status post CABG, hypertension, history of CVA, history of depression presented to the ER because of chest pain. Patient has been experiencing chest pressure over the last 2 days which is retrosternal sometimes it is the back and happens on exertion with some associated shortness of breath. The symptoms improved on taking rest. Denies any fever chills or cough diaphoresis nausea or any abdominal pain. Patient at this time chest pain-free and is admitted for further observation. Patient's initial cardiac enzymes chest x-ray does not show anything acute.   Past Medical History  Diagnosis Date  . Stroke 2010  . Coronary artery disease   . Shortness of breath   . Mental disorder   . Hypertension   . GERD (gastroesophageal reflux disease)   . Headache   . Anxiety   . Neuromuscular disorder   . Fibromyalgia   . Depression     Past Surgical History  Procedure Date  . Coronary artery bypass graft   . Cardiac catheterization   . Fracture surgery   . Cardiac valve replacement   . Tubal ligation     History reviewed. No pertinent family history. Social History:  reports that she has been smoking Cigarettes.  She has a 35 pack-year smoking history. She does not have any smokeless tobacco history on file. She reports that she drinks about 4.2 ounces of alcohol per week. She reports that she does not use illicit drugs.  Allergies: No Known Allergies  Medications Prior to Admission  Medication Sig Dispense Refill  . ALPRAZolam (XANAX) 0.5 MG tablet Take 0.5 mg by mouth 3 (three) times daily as needed. For anxiety      . ARIPiprazole (ABILIFY) 10 MG tablet Take 10 mg by mouth daily.        Marland Kitchen aspirin EC 81 MG tablet Take 81 mg by mouth daily.      Marland Kitchen atomoxetine (STRATTERA) 60 MG capsule Take 60 mg by mouth daily.        . carvedilol (COREG) 25 MG tablet  Take 25 mg by mouth daily at 6 (six) AM.       . Cyanocobalamin (VITAMIN B-12 PO) Take 1 tablet by mouth daily.      . cyclobenzaprine (FLEXERIL) 10 MG tablet Take 10 mg by mouth at bedtime as needed. For muscle spasms.      Marland Kitchen FLUoxetine (PROZAC) 40 MG capsule Take 40 mg by mouth daily.        Marland Kitchen lisinopril (PRINIVIL,ZESTRIL) 40 MG tablet Take 40 mg by mouth daily.        Marland Kitchen omeprazole (PRILOSEC) 20 MG capsule Take 20 mg by mouth daily as needed. For heartburn/GERD      . pravastatin (PRAVACHOL) 40 MG tablet Take 40 mg by mouth daily.        Marland Kitchen rOPINIRole (REQUIP) 1 MG tablet Take 1 mg by mouth at bedtime.          Results for orders placed during the hospital encounter of 02/29/12 (from the past 48 hour(s))  CBC     Status: Normal   Collection Time   02/29/12 11:55 PM      Component Value Range Comment   WBC 10.1  4.0 - 10.5 (K/uL)    RBC 4.33  3.87 - 5.11 (MIL/uL)    Hemoglobin 13.7  12.0 - 15.0 (g/dL)  HCT 41.4  36.0 - 46.0 (%)    MCV 95.6  78.0 - 100.0 (fL)    MCH 31.6  26.0 - 34.0 (pg)    MCHC 33.1  30.0 - 36.0 (g/dL)    RDW 16.1  09.6 - 04.5 (%)    Platelets 266  150 - 400 (K/uL)   BASIC METABOLIC PANEL     Status: Abnormal   Collection Time   02/29/12 11:55 PM      Component Value Range Comment   Sodium 136  135 - 145 (mEq/L)    Potassium 3.4 (*) 3.5 - 5.1 (mEq/L)    Chloride 100  96 - 112 (mEq/L)    CO2 25  19 - 32 (mEq/L)    Glucose, Bld 82  70 - 99 (mg/dL)    BUN 12  6 - 23 (mg/dL)    Creatinine, Ser 4.09  0.50 - 1.10 (mg/dL)    Calcium 9.4  8.4 - 10.5 (mg/dL)    GFR calc non Af Amer >90  >90 (mL/min)    GFR calc Af Amer >90  >90 (mL/min)   CARDIAC PANEL(CRET KIN+CKTOT+MB+TROPI)     Status: Normal   Collection Time   02/29/12 11:55 PM      Component Value Range Comment   Total CK 73  7 - 177 (U/L)    CK, MB 2.3  0.3 - 4.0 (ng/mL)    Troponin I <0.30  <0.30 (ng/mL)    Relative Index RELATIVE INDEX IS INVALID  0.0 - 2.5     Dg Chest 2 View  03/01/2012  *RADIOLOGY  REPORT*  Clinical Data: Chest pain and shortness of breath.  CHEST - 2 VIEW  Comparison: 09/18/2011  Findings: Lungs are hyperexpanded. The lungs are clear without focal infiltrate, edema, pneumothorax or pleural effusion. The cardiopericardial silhouette is within normal limits for size.  The patient is status post CABG. Imaged bony structures of the thorax are intact.  IMPRESSION: Stable.  No acute findings.  Original Report Authenticated By: ERIC A. MANSELL, M.D.    Review of Systems  Constitutional: Negative.   HENT: Negative.   Eyes: Negative.   Respiratory: Positive for shortness of breath.   Cardiovascular: Positive for chest pain.  Gastrointestinal: Negative.   Genitourinary: Negative.   Musculoskeletal: Negative.   Skin: Negative.   Neurological: Negative.   Endo/Heme/Allergies: Negative.   Psychiatric/Behavioral: Negative.     Blood pressure 124/73, pulse 89, temperature 99.5 F (37.5 C), temperature source Oral, resp. rate 20, height 5\' 2"  (1.575 m), weight 52.799 kg (116 lb 6.4 oz), last menstrual period 02/13/2012, SpO2 97.00%. Physical Exam  Constitutional: She is oriented to person, place, and time. She appears well-developed and well-nourished. No distress.  HENT:  Head: Normocephalic and atraumatic.  Right Ear: External ear normal.  Left Ear: External ear normal.  Mouth/Throat: No oropharyngeal exudate.  Eyes: Conjunctivae are normal. Pupils are equal, round, and reactive to light. Right eye exhibits no discharge. Left eye exhibits no discharge. No scleral icterus.  Neck: Normal range of motion. Neck supple.  Cardiovascular: Normal rate and regular rhythm.   Respiratory: Effort normal and breath sounds normal. No respiratory distress. She has no wheezes. She has no rales.  GI: Soft. Bowel sounds are normal. She exhibits no distension. There is no tenderness. There is no rebound.  Musculoskeletal: Normal range of motion. She exhibits no edema and no tenderness.    Neurological: She is alert and oriented to person, place, and time.  Moves all extremities.  Skin: Skin is warm and dry. No rash noted. She is not diaphoretic. No erythema.  Psychiatric: Her behavior is normal.     Assessment/Plan #1. Chest pain with history of CAD status post CABG rule out ACS  - patient is chest pain-free at this time. Cycle cardiac markers. Continue aspirin and nitroglycerin.  #2. Hypertension - continue home medications. #3. History of bipolar disorder  - and continue home medications. #4. Tobacco abuse - strongly advised to quit smoking. #5. Hyperlipidemia - continue present medications.  Adham Johnson N. 03/01/2012, 4:06 AM

## 2012-03-01 NOTE — Progress Notes (Signed)
Patient ID: [name]  female  WJX:[id number]    DOB: 04/19/1966    DOA: 02/29/2012  PCP: Samuel Jester, DO, DO  Subjective: No chest pain, shortness of breath currently.   Objective: Weight change:   Intake/Output Summary (Last 24 hours) at 03/01/12 1231 Last data filed at 03/01/12 1015  Gross per 24 hour  Intake      3 ml  Output      0 ml  Net      3 ml   Blood pressure 110/67, pulse 90, temperature 99.5 F (37.5 C), temperature source Oral, resp. rate 20, height 5\' 2"  (1.575 m), weight 52.799 kg (116 lb 6.4 oz), last menstrual period 02/13/2012, SpO2 97.00%.  Physical Exam: General: Alert and awake, oriented x3, not in any acute distress. HEENT: anicteric sclera, pupils reactive to light and accommodation, EOMI CVS: S1-S2 clear, no murmur rubs or gallops Chest: clear to auscultation bilaterally, no wheezing, rales or rhonchi Abdomen: soft nontender, nondistended, normal bowel sounds, no organomegaly Extremities: no cyanosis, clubbing or edema noted bilaterally Neuro: Cranial nerves II-XII intact, no focal neurological deficits  Lab Results: Basic Metabolic Panel:  Lab 03/01/12 2130 02/29/12 2355  NA 136 136  K 3.6 3.4*  CL 101 100  CO2 26 25  GLUCOSE 108* 82  BUN 12 12  CREATININE 0.70 0.78  CALCIUM 9.4 9.4  MG -- --  PHOS -- --   Liver Function Tests:  Lab 03/01/12 0450  AST 15  ALT 11  ALKPHOS 90  BILITOT 0.2*  PROT 6.7  ALBUMIN 2.9*   CBC:  Lab 03/01/12 0450 02/29/12 2355  WBC 11.2* 10.1  NEUTROABS -- --  HGB 12.9 13.7  HCT 38.5 41.4  MCV 94.1 95.6  PLT 264 266   Cardiac Enzymes:  Lab 03/01/12 0450 02/29/12 2355  CKTOTAL 45 73  CKMB 1.7 2.3  CKMBINDEX -- --  TROPONINI <0.30 <0.30   Studies/Results: Dg Chest 2 View  03/01/2012  *RADIOLOGY REPORT*  Clinical Data: Chest pain and shortness of breath.  CHEST - 2 VIEW  Comparison: 09/18/2011  Findings: Lungs are hyperexpanded. The lungs are clear without focal infiltrate, edema, pneumothorax  or pleural effusion. The cardiopericardial silhouette is within normal limits for size.  The patient is status post CABG. Imaged bony structures of the thorax are intact.  IMPRESSION: Stable.  No acute findings.  Original Report Authenticated By: ERIC A. MANSELL, M.D.    Medications: Scheduled Meds:   . ARIPiprazole  10 mg Oral Daily  . aspirin  324 mg Oral Once  . aspirin EC  325 mg Oral Daily  . atomoxetine  60 mg Oral Daily  . carvedilol  25 mg Oral Q0600  . enoxaparin  40 mg Subcutaneous Q24H  . FLUoxetine  40 mg Oral Daily  . lisinopril  40 mg Oral Daily  . pantoprazole  40 mg Oral Q1200  . potassium chloride  30 mEq Oral Once  . rOPINIRole  1 mg Oral QHS  . simvastatin  20 mg Oral q1800  . sodium chloride  3 mL Intravenous Q12H   Continuous Infusions:   . sodium chloride 75 mL/hr (03/01/12 0622)     Assessment/Plan: Principal Problem:  *Chest pain with a history of premature coronary artery disease status post CABG - Per patient, has not seen her cardiologist, Dr. Dietrich Pates for last one year, and she has not had echo or stress test recently - 2 sets of cardiac enzymes negative, I ordered 2-D echocardiogram for further workup,  also placed [facility] cardiology consult for recommendations if inpatient versus outpatient stress test can be arranged - Continue aspirin, Coreg, lisinopril, simvastatin  Active Problems:  TOBACCO ABUSE: patient was strongly advised to quit smoking   HYPERTENSION: Continue medications, stable  DVT Prophylaxis: Lovenox  Code Status:  Disposition: Await cardiology recommendations    LOS: 1 day   Dillin Lofgren M.D. [facility] 03/01/2012, 12:31 PM Pager: [phone]

## 2012-03-01 NOTE — Consult Note (Signed)
CARDIOLOGY CONSULT NOTE  Patient ID: [name], MRN: [id number], DOB/AGE: Sep 22, 1966 46 y.o. Admit date: 02/29/2012 Date of Consult: 03/01/2012  Primary Physician: Alcide Evener, DO Primary Cardiologist: Dr. Dietrich Pates  Chief Complaint: Chest Pain Reason for Consultation: Chest Pain  HPI: 46 y.o. 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  She underwent 3V CABG in 2009 and has intermittent chest pain since that time. She last saw Dr. Dietrich Pates in 2010 and has not had any cardiology follow up or evaluation since that time. Carotid dopplers in 2010 showed mild plaque w/ 50-69% stenosis in the RICA. She experienced a different more severe substernal chest tightness on 5/5. The pain radiated to her back and was associated with nausea, shortness of breath and diaphoresis. The pain is worse with inspiration and certain movements. She denies cp with exertion, sob, doe, edema, orthopnea, recent illness, fever, chills, cough, calf pain, or melena/hematochezia. She presented to the [facility] ED on 02/29/12 at which time her cardiac enzymes were normal, CXR without acute findings, and EKG without ischemic changes. Cardiac enzymes were cycled and remained negative. Echo shows nl LV function, EF 55-60%, no RWMAs or valvular abnormalities. She is chest pain free and otherwise without complaints. She continues to smoke <1/2ppd.     Past Medical History  Diagnosis Date  . Coronary artery disease     s/p 3V CABG '09  . Hypertension   . GERD (gastroesophageal reflux disease)   . Anxiety   . Fibromyalgia   . Depression   . Hyperlipidemia   . Tobacco abuse   . Carotid artery disease     Right 50-69% stenosis by doppler 2010  . ADD (attention deficit disorder with hyperactivity)   . Bipolar 1 disorder   . CVA (cerebral infarction) 12/2008   Reports a neurologist told her she had a blood clot in her brain    Carotid Doppler - 10/02/2009   Right internal carotid artery-mild plaque with 50-69% stenosis of the proximal vessel.  Left internal carotid artery-modest plaque in the carotid bulb and proximal vessel; no significant focal stenosis.   Surgical History:  Past Surgical History  Procedure Date  . Coronary artery bypass graft 2009    3V CABG LIMA --> LAD, SVG --> Ramus Intermediate,  SVG --> RCA  . Cardiac catheterization   . Tubal ligation 1990     Home Meds: Medication Sig  ALPRAZolam (XANAX) 0.5 MG tablet Take 0.5 mg by mouth 3 (three) times daily as needed. For anxiety  ARIPiprazole (ABILIFY) 10 MG tablet Take 10 mg by mouth daily.    aspirin EC 81 MG tablet Take 81 mg by mouth daily.  atomoxetine (STRATTERA) 60 MG capsule Take 60 mg by mouth daily.    carvedilol (COREG) 25 MG tablet Take 25 mg by mouth daily at 6 (six) AM.   Cyanocobalamin (VITAMIN B-12 PO) Take 1 tablet by mouth daily.  cyclobenzaprine (FLEXERIL) 10 MG tablet Take 10 mg by mouth at bedtime as needed. For muscle spasms.  FLUoxetine (PROZAC) 40 MG capsule Take 40 mg by mouth daily.    lisinopril (PRINIVIL,ZESTRIL) 40 MG tablet Take 40 mg by mouth daily.    omeprazole (PRILOSEC) 20 MG capsule Take 20 mg by mouth daily as needed. For heartburn/GERD  pravastatin (PRAVACHOL) 40 MG tablet Take 40 mg by mouth daily.  rOPINIRole (REQUIP) 1 MG tablet Take 1 mg by mouth at bedtime.     Inpatient Medications:   . ARIPiprazole  10 mg Oral Daily  . aspirin  324 mg Oral Once  . aspirin EC  325 mg Oral Daily  . atomoxetine  60 mg Oral Daily  . carvedilol  25 mg Oral Q0600  . enoxaparin  40 mg Subcutaneous Q24H  . FLUoxetine  40 mg Oral Daily  . lisinopril  40 mg Oral Daily  . pantoprazole  40 mg Oral Q1200  . potassium chloride  30 mEq Oral Once  . rOPINIRole  1 mg Oral QHS  . simvastatin  20 mg Oral q1800  . sodium chloride  3 mL Intravenous Q12H     . sodium chloride 75 mL/hr (03/01/12 0622)    Allergies: No Known Allergies  Social History  . Marital Status: Divorced    Spouse Name: N/A    Number of Children: N/A  . Years of Education: N/A   Occupational History  . Unemployed     Application for disability pending, inadequate funds to afford medical care   Social History Main Topics  . Smoking status: Current Everyday Smoker -- 1.0 packs/day for 35 years    Types: Cigarettes  . Alcohol Use: 4.2 oz/week    7 Cans of beer per week  . Drug Use: No  . Sexually Active: Yes   Social History Narrative   Divorced with 2 children     Family history: No known family h/o CAD  Review of Systems: General: negative for chills, fever, night sweats or weight changes.  Cardiovascular: (+) chest pain; negative for shortness of breath, dyspnea on exertion, edema, orthopnea, palpitations, or paroxysmal nocturnal dyspnea Dermatological: negative for rash Respiratory: negative for cough or wheezing Urologic: negative for hematuria Abdominal: negative for nausea, vomiting, diarrhea, bright red blood per rectum, melena, or hematemesis Neurologic: negative for visual changes, syncope, or dizziness All other systems reviewed and are otherwise negative except as noted above.  Labs:  [facility] 03/01/12 0450 02/29/12 2355  CKTOTAL 45 73  CKMB 1.7 2.3  TROPONINI <0.30 <0.30   Component Value Date   WBC 11.2* 03/01/2012   HGB 12.9 03/01/2012   HCT 38.5 03/01/2012   MCV 94.1 03/01/2012   PLT 264 03/01/2012    Lab 03/01/12 0450  NA 136  K 3.6  CL 101  CO2 26  BUN 12  CREATININE 0.70  CALCIUM 9.4  PROT 6.7  BILITOT 0.2*  ALKPHOS 90  ALT 11  AST 15  GLUCOSE 108*    Radiology/Studies:   03/01/12 - 2D Echocardiogram Study Conclusions: Left ventricle: The cavity size was normal. Systolic function was normal. The estimated ejection fraction was in the range of 55% to 60%. Wall motion was normal; there were no regional wall motion  abnormalities.  03/01/2012 - CXR Findings: Lungs are hyperexpanded. The lungs are clear without focal infiltrate, edema, pneumothorax or pleural effusion. The cardiopericardial silhouette is within normal limits for size.  The patient is status post CABG. Imaged bony structures of the thorax are intact.  IMPRESSION: Stable.  No acute findings.    EKG: 02/29/12 @ 2050 - NSR 73bpm, no acute ischemic changes  Physical Exam: Blood pressure 110/67, pulse 90, temperature 99.5 F (37.5 C), temperature source Oral, resp. rate 20, height 5\' 2"  (1.575 m), weight 116 lb 6.4 oz (52.799 kg), last menstrual period 02/13/2012, SpO2 97.00%. General: Well developed, well nourished, white female in no acute distress.  Head: Normocephalic, atraumatic, sclera non-icteric, no xanthomas, nares are without discharge.  Neck: Supple. Negative for carotid bruits. JVD not elevated. Lungs: Clear bilaterally to auscultation without wheezes, rales, or rhonchi. Breathing is unlabored. Heart: RRR with S1 S2. Soft systolic murmur; No rubs or gallops appreciated. Abdomen: Soft, non-tender, non-distended with normoactive bowel sounds. No hepatomegaly. No rebound/guarding. No obvious abdominal masses. Msk:  Strength and tone appear normal for age. Extremities: No clubbing or cyanosis. No edema. No calf pain or swelling.  Distal pedal pulses are intact and equal bilaterally. Neuro: Alert and oriented X 3. Moves all extremities spontaneously. Psych:  Responds to questions appropriately with a normal affect.   Assessment and Plan:  46 y.o. 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 Chest Pain: She has a h/o premature CAD s/p CABG at age 29 as well as risk factors including ongoing tobacco abuse, PVD, HTN, and HLD. Her symptoms are atypical. She has had three days  of chest pain with normal cardiac enzymes and nonacute EKG. Echo shows nl EF and no RWMAs. Don't suspect ACS. She has some Right Axis deviation on EKG and a pleuritic component to chest pain so will check DDimer to rule out PE. If negative no further inpatient cardiac work up needed. If positive will need CTA. She will need outpatient exercise myoview and follow up with Dr. Dietrich Pates (will schedule).   2. CAD: s/p 3V CABG 2009; No cardiology follow up or evaluation since that time. Do not suspect current symptoms are cardiac in etiology. Plans as above. Coreg changed to BID. Cont ASA and statin.  3. Carotid Dz: Carotid Doppler 2010 shows RICA, 50-69%. She is asymptomatic, no carotid bruit on exam. She will need outpatient follow up carotid dopplers (will schedule).  4. Tobacco Abuse: Counseled on discontinuation. Also received formal cessation counseling.  Signed, HOPE, JESSICA PA-C 03/01/2012, 1:48 PM   As above, patient seen and examined. 46 year old female with past medical history of coronary artery disease status post coronary artery bypass graft in 2009, hypertension, hyperlipidemia, tobacco abuse with complaints of chest pain. She apparently has had intermittent chest pain since her bypass surgery. On May 5 the patient developed substernal chest tightness. The pain radiated to her back. There was associated nausea, shortness of breath and diaphoresis. The pain increased with inspiration and certain movements. It was continuous and lasted until May 7 and resolve spontaneously. She was admitted and ruled out for myocardial infarction with serial enzymes. Her electrocardiogram showed  no ST changes. Her echocardiogram shows normal LV function. Cardiology is asked to evaluate. Symptoms are extremely atypical. They were continuous in duration for approximately 48 hours with negative enzymes. Patient can be discharged from a cardiac standpoint for an outpatient functional study. There is right axis  deviation on electrocardiogram and a pleuritic component to her symptoms. Check d-dimer prior to discharge. If abnormal proceed with CT scan to exclude pulmonary embolus. She will also need outpatient carotid Dopplers to followup on known cerebrovascular disease. Continue aspirin, statin and ACE inhibitor. Change carvedilol to 12.5 mg by mouth twice a day (she is taking this 25 mg daily at present and it is a twice a day medication). Patient counseled on discontinuing tobacco use. Followup with Dr. Dietrich Pates after functional study and carotid Dopplers performed. Olga Millers MD 03/01/12 1:37 PM

## 2012-03-01 NOTE — Progress Notes (Signed)
Pt d/c home with instructions, r/x, and f/u appointments, along with stress test info.  Pt verbalized understanding of instructions, pt escorted self out per request.  Ninetta Lights RN

## 2012-03-01 NOTE — Progress Notes (Signed)
*  PRELIMINARY RESULTS* Echocardiogram 2D Echocardiogram has been performed.  Glean Salen [facility] 03/01/2012, 11:31 AM

## 2012-03-01 NOTE — Discharge Instructions (Signed)
   You have a Stress Test scheduled on 03/07/12 at [facility] in [place]. Your doctor has ordered this test to get a better idea of how your heart works.  Please arrive 15 minutes early for paperwork.   Location: [address]  [place], [place] [address] [phone]  Instructions:  No food/drink after midnight the night before.   No caffeine/decaf products 24 hours before, including medicines such as Excedrin or Goody Powders. Call if there are any questions.   Wear comfortable clothes and shoes.   It is OK to take your morning meds with a sip of water EXCEPT for those types of medicines listed below or otherwise instructed.  Special Medication Instructions:  Beta blockers such as metoprolol (Lopressor/Toprol XL), atenolol (Tenormin), carvedilol (Coreg), nebivolol (Bystolic), propranolol (Inderal) should not be taken for 24 hours before the test.  Calcium channel blockers such as diltiazem (Cardizem) or verapmil (Calan) should not be taken for 24 hours before the test.  Remove nitroglycerin patches and do not take nitrate preparations such as Imdur/isosorbide the day of your test.  No Persantine/Theophylline or Aggrenox medicines should be used within 24 hours of the test.   What To Expect: The whole test will take several hours. When you arrive in the lab, the technician will inject a small amount of radioactive tracer into your arm through an IV while you are resting quietly. This helps [place] to form pictures of your heart. You will likely only feel a sting from the IV. After a waiting period, resting pictures will be obtained under a big camera. These are the "before" pictures.  Next, you will be prepped for the stress portion of the test. This will include walking on a treadmill. You will walk at different paces to try to get your heart rate to a goal number that is based on your age. Your blood pressure and heart rate will be monitored, and we will be watching your EKG on a  computer screen for any changes. During this portion of the test, the radiologist will inject another small amount of radioactive tracer into your IV. After a waiting period, you will undergo a second set of pictures. These are the "after" pictures.  The doctor reading the test will compare the before-and-after images to look for evidence of heart blockages or heart weakness.

## 2012-03-01 NOTE — Progress Notes (Signed)
Pt smokes 1 ppd and is in contemplation stage. Discussed risk factors and health effects of continued smoking as it relates to her health status. Pt voices understanding. Encouraged pt to quit. Referred to 1-800 quit now for f/u and support. Discussed oral fixation substitutes, second hand smoke and in home smoking policy. Reviewed and gave pt Written education/contact information.

## 2012-03-01 NOTE — Discharge Summary (Signed)
Physician Discharge Summary  Patient ID: [name] MRN: [id number] DOB/AGE: 1966-09-27 46 y.o.  Admit date: 02/29/2012 Discharge date: 03/01/2012  Primary Care Physician:  Samuel Jester, DO, DO  Discharge Diagnoses:    .Chest pain resolved  .HYPERTENSION .CAD (coronary artery disease) .TOBACCO ABUSE Elevated d-dimer: CT angiogram of the chest negative for PE  Consults:  [facility], Dr. Jens Som  Discharge Medications: Medication List  As of 03/01/2012  3:05 PM   TAKE these medications         ALPRAZolam 0.5 MG tablet   Commonly known as: XANAX   Take 0.5 mg by mouth 3 (three) times daily as needed. For anxiety      ARIPiprazole 10 MG tablet   Commonly known as: ABILIFY   Take 10 mg by mouth daily.      aspirin EC 81 MG tablet   Take 81 mg by mouth daily.      atomoxetine 60 MG capsule   Commonly known as: STRATTERA   Take 60 mg by mouth daily.      carvedilol 25 MG tablet   Commonly known as: COREG   Take 25 mg by mouth daily at 6 (six) AM.      cyclobenzaprine 10 MG tablet   Commonly known as: FLEXERIL   Take 10 mg by mouth at bedtime as needed. For muscle spasms.      FLUoxetine 40 MG capsule   Commonly known as: PROZAC   Take 40 mg by mouth daily.      lisinopril 40 MG tablet   Commonly known as: PRINIVIL,ZESTRIL   Take 40 mg by mouth daily.      nitroGLYCERIN 0.4 MG SL tablet   Commonly known as: NITROSTAT   Place 1 tablet (0.4 mg total) under the tongue every 5 (five) minutes as needed for chest pain.      omeprazole 20 MG capsule   Commonly known as: PRILOSEC   Take 20 mg by mouth daily as needed. For heartburn/GERD      pravastatin 40 MG tablet   Commonly known as: PRAVACHOL   Take 40 mg by mouth daily.      rOPINIRole 1 MG tablet   Commonly known as: REQUIP   Take 1 mg by mouth at bedtime.      VITAMIN B-12 PO   Take 1 tablet by mouth daily.             Brief H and P: For complete details please refer to admission H and P,  but in brief patient is a 46 year old female with history of coronary artery disease, CABG, hypertension, CVA, depression presented to ED with chest pain. Patient was experiencing chest pressure over the past 2 days prior to admission, retrosternal with some associated shortness of breath. Patient was admitted for further workup  Hospital Course:  Chest pain with a history of premature coronary artery disease status post CABG. Per patient, she had not seen her cardiologist, Dr. Dietrich Pates for last one year, and she has not had echo or stress test recently. Patient was admitted, placed on telemetry, cardiac enzymes were obtained which were negative for any acute ACS. 2-D echocardiogram was obtained which showed EF of 55-60% with no regional wall motion abnormalities. [facility] was consulted, being patient high risk, patient was seen by Dr. Jens Som who arranged outpatient stress test on 03/07/2012, follow up appointment with Dr. Dietrich Pates and carotid Dopplers for further workup. D-dimer was obtained which was somewhat elevated hence CT angiogram of the  chest was pursued which was negative for any acute pulmonary embolism. Patient was continued on aspirin, Coreg, lisinopril, simvastatin   TOBACCO ABUSE: patient was strongly advised to quit smoking    Day of Discharge BP 110/67  Pulse 90  Temp(Src) 99.5 F (37.5 C) (Oral)  Resp 20  Ht 5\' 2"  (1.575 m)  Wt 52.799 kg (116 lb 6.4 oz)  BMI 21.29 kg/m2  SpO2 97%  LMP 02/13/2012  Physical Exam: General: Alert and awake oriented x3 not in any acute distress. HEENT: anicteric sclera, pupils reactive to light and accommodation CVS: S1-S2 clear no murmur rubs or gallops Chest: clear to auscultation bilaterally, no wheezing rales or rhonchi Abdomen: soft nontender, nondistended, normal bowel sounds, no organomegaly Extremities: no cyanosis, clubbing or edema noted bilaterally Neuro: Cranial nerves II-XII intact, no focal neurological  deficits   The results of significant diagnostics from this hospitalization (including imaging, microbiology, ancillary and laboratory) are listed below for reference.    LAB RESULTS: Basic Metabolic Panel:  Lab 03/01/12 8341 02/29/12 2355  NA 136 136  K 3.6 3.4*  CL 101 100  CO2 26 25  GLUCOSE 108* 82  BUN 12 12  CREATININE 0.70 0.78  CALCIUM 9.4 9.4  MG -- --  PHOS -- --   Liver Function Tests:  Lab 03/01/12 0450  AST 15  ALT 11  ALKPHOS 90  BILITOT 0.2*  PROT 6.7  ALBUMIN 2.9*   CBC:  Lab 03/01/12 0450 02/29/12 2355  WBC 11.2* 10.1  NEUTROABS -- --  HGB 12.9 13.7  HCT 38.5 41.4  MCV 94.1 --  PLT 264 266   Cardiac Enzymes:  Lab 03/01/12 1217 03/01/12 0450  CKTOTAL 32 45  CKMB 1.6 1.7  CKMBINDEX -- --  TROPONINI <0.30 <0.30   BNP: No components found with this basename: POCBNP:2 CBG: No results found for this basename: GLUCAP:2 in the last 168 hours  Significant Diagnostic Studies:  No results found.   Disposition and Follow-up: Discharge Orders    Future Appointments: Provider: Department: Dept Phone: Center:   03/07/2012 12:00 PM [facility] [phone] [facility]   03/13/2012 1:00 PM Jodelle Gross, NP [facility] [phone] [id number]     Future Orders Please Complete By Expires   Diet - low sodium heart healthy      Increase activity slowly          DISPOSITION: Home  DIET: Heart healthy  ACTIVITY: As tolerated  TESTS THAT NEED FOLLOW-UP Nuclear medicine stress test on 03/07/2012  DISCHARGE FOLLOW-UP Follow-up Information    Follow up with [facility] on 03/07/2012. (Please present to the radiology department at [facility] at 9:15 for your stress test. If you  have any questions please call our [place] office. [phone])    Contact information:   Radiology Department [address] [place] [address] [phone]      Follow  up with [facility]. (Our office will call you with your appointment time for carotid doppler.)    Contact information:   Radiology Department [address] [place] [address] [phone]      Follow up with [place] Bing, MD on 03/13/2012. (1:00 PM)    Contact information:    [facility] [address] [place] [address] [phone]          Time spent on Discharge: 40 minutes  Signed:  Kmya Placide M.D. [facility] 03/01/2012, 3:05 PM

## 2012-03-01 NOTE — ED Provider Notes (Signed)
History     CSN: [id number]  Arrival date & time 02/29/12  2042   First MD Initiated Contact with Patient 02/29/12 2343      Chief Complaint  Patient presents with  . Chest Pain    (Consider location/radiation/quality/duration/timing/severity/associated sxs/prior treatment) HPI Pt with hx of CAD and prior CABG in 2009 presents with sensation of chest heaviness, fatigue and nausea.  She states symptoms began yesterday intermittently, but tonight were increased.  She states pain radiates into her back and feels similar to when she had her prior MI.  No diaphoresis or difficulty breathing.  Denies leg swelling, no hx of dvt/pe, no recent travel/trauma/surgery.  She takes a daily aspirin.  Per chart review she was admitted in 11/12 for chest pain- did follow up with cardiology but did not have a stress test following that admission.  Symptoms are worse with exertion.  She has not taken anything for her pain prior to arrival.  There are no other alleviating or modifying factors, there are no associated systemic symptoms.    Past Medical History  Diagnosis Date  . Stroke 2010  . Coronary artery disease   . Shortness of breath   . Mental disorder   . Hypertension   . GERD (gastroesophageal reflux disease)   . Headache   . Anxiety   . Neuromuscular disorder   . Fibromyalgia   . Depression     Past Surgical History  Procedure Date  . Coronary artery bypass graft   . Cardiac catheterization   . Fracture surgery   . Cardiac valve replacement   . Tubal ligation     History reviewed. No pertinent family history.  History  Substance Use Topics  . Smoking status: Current Everyday Smoker -- 1.0 packs/day for 35 years    Types: Cigarettes  . Smokeless tobacco: Not on file  . Alcohol Use: 4.2 oz/week    7 Cans of beer per week    OB History    Grav Para Term Preterm Abortions TAB SAB Ect Mult Living                  Review of Systems ROS reviewed and all otherwise negative  except for mentioned in HPI  Allergies  Review of patient's allergies indicates no known allergies.  Home Medications   No current outpatient prescriptions on file.  BP 124/73  Pulse 89  Temp(Src) 99.5 F (37.5 C) (Oral)  Resp 20  Ht 5\' 2"  (1.575 m)  Wt 116 lb 6.4 oz (52.799 kg)  BMI 21.29 kg/m2  SpO2 97%  LMP 02/13/2012 Vitals reviewed Physical Exam Physical Examination: General appearance - alert, well appearing, and in no distress Mental status - alert, oriented to person, place, and time Mouth - mucous membranes moist, pharynx normal without lesions Chest - clear to auscultation, no wheezes, rales or rhonchi, symmetric air entry Heart - normal rate, regular rhythm, normal S1, S2, no murmurs, rubs, clicks or gallops Abdomen - soft, nontender, nondistended, no masses or organomegaly, nabs Extremities - peripheral pulses normal, no pedal edema, no clubbing or cyanosis Skin - normal coloration and turgor, no rashes Psych- normal mood and affect  ED Course  Procedures (including critical care time)  Date: 03/01/2012  Rate: 73  Rhythm: normal sinus rhythm  QRS Axis: normal, rightward  Intervals: normal  ST/T Wave abnormalities: normal  Conduction Disutrbances:none  Narrative Interpretation:   Old EKG Reviewed: unchanged compared to prior of 09/19/11  1:23 AM  D/w Dr. Rueben Bash,  triad- he requests admission to Team 4, telemetry  Labs Reviewed  BASIC METABOLIC PANEL - Abnormal; Notable for the following:    Potassium 3.4 (*)    All other components within normal limits  COMPREHENSIVE METABOLIC PANEL - Abnormal; Notable for the following:    Glucose, Bld 108 (*)    Albumin 2.9 (*)    Total Bilirubin 0.2 (*)    All other components within normal limits  CBC - Abnormal; Notable for the following:    WBC 11.2 (*)    All other components within normal limits  CBC  CARDIAC PANEL(CRET KIN+CKTOT+MB+TROPI)  CARDIAC PANEL(CRET KIN+CKTOT+MB+TROPI)  CARDIAC PANEL(CRET  KIN+CKTOT+MB+TROPI)  CARDIAC PANEL(CRET KIN+CKTOT+MB+TROPI)  TSH   Dg Chest 2 View  03/01/2012  *RADIOLOGY REPORT*  Clinical Data: Chest pain and shortness of breath.  CHEST - 2 VIEW  Comparison: 09/18/2011  Findings: Lungs are hyperexpanded. The lungs are clear without focal infiltrate, edema, pneumothorax or pleural effusion. The cardiopericardial silhouette is within normal limits for size.  The patient is status post CABG. Imaged bony structures of the thorax are intact.  IMPRESSION: Stable.  No acute findings.  Original Report Authenticated By: ERIC A. MANSELL, M.D.     1. Chest pain   2. Hypokalemia       MDM  Pt presenting with c/o chest discomfort which feels similar to her prior MI.  EKG and initial lab workup in ED reassuring.  Pt given aspirin and nitroglycerin.  PT admitted to triad service for further evaluation and management        Ethelda Chick, MD 03/01/12 [phone]

## 2012-03-01 NOTE — Progress Notes (Signed)
UR Completed. Simmons, Tammey Deeg F [phone]  

## 2012-03-02 ENCOUNTER — Other Ambulatory Visit: Payer: Self-pay | Admitting: Cardiology

## 2012-03-02 DIAGNOSIS — R0989 Other specified symptoms and signs involving the circulatory and respiratory systems: Secondary | ICD-10-CM

## 2012-03-06 ENCOUNTER — Other Ambulatory Visit: Payer: Self-pay | Admitting: Cardiology

## 2012-03-07 ENCOUNTER — Ambulatory Visit (HOSPITAL_COMMUNITY): Payer: Self-pay

## 2012-03-07 ENCOUNTER — Other Ambulatory Visit: Payer: Self-pay | Admitting: Cardiology

## 2012-03-07 ENCOUNTER — Encounter (HOSPITAL_COMMUNITY): Payer: Self-pay

## 2012-03-07 ENCOUNTER — Other Ambulatory Visit (HOSPITAL_COMMUNITY): Payer: Self-pay

## 2012-03-07 DIAGNOSIS — R0989 Other specified symptoms and signs involving the circulatory and respiratory systems: Secondary | ICD-10-CM

## 2012-03-13 ENCOUNTER — Encounter: Payer: 59 | Admitting: Adult Health

## 2012-03-13 ENCOUNTER — Encounter: Payer: Self-pay | Admitting: Adult Health

## 2013-02-28 ENCOUNTER — Encounter: Payer: Self-pay | Admitting: Physician Assistant

## 2013-03-01 DIAGNOSIS — R079 Chest pain, unspecified: Secondary | ICD-10-CM

## 2013-03-22 ENCOUNTER — Encounter: Payer: Self-pay | Admitting: Physician Assistant

## 2013-11-06 ENCOUNTER — Other Ambulatory Visit (HOSPITAL_COMMUNITY): Payer: Self-pay | Admitting: *Deleted

## 2013-11-06 DIAGNOSIS — I679 Cerebrovascular disease, unspecified: Secondary | ICD-10-CM

## 2013-11-06 DIAGNOSIS — I2510 Atherosclerotic heart disease of native coronary artery without angina pectoris: Secondary | ICD-10-CM

## 2013-11-06 DIAGNOSIS — I6529 Occlusion and stenosis of unspecified carotid artery: Secondary | ICD-10-CM

## 2013-11-09 ENCOUNTER — Ambulatory Visit (HOSPITAL_COMMUNITY)
Admission: RE | Admit: 2013-11-09 | Discharge: 2013-11-09 | Disposition: A | Discharge status: Home / Self Care | Payer: BC Managed Care – PPO | Source: Ambulatory Visit | Attending: *Deleted | Admitting: *Deleted

## 2013-11-09 DIAGNOSIS — I679 Cerebrovascular disease, unspecified: Secondary | ICD-10-CM | POA: Insufficient documentation

## 2013-11-09 DIAGNOSIS — I658 Occlusion and stenosis of other precerebral arteries: Secondary | ICD-10-CM | POA: Insufficient documentation

## 2013-11-09 DIAGNOSIS — Z8673 Personal history of transient ischemic attack (TIA), and cerebral infarction without residual deficits: Secondary | ICD-10-CM | POA: Insufficient documentation

## 2013-11-09 DIAGNOSIS — E785 Hyperlipidemia, unspecified: Secondary | ICD-10-CM | POA: Insufficient documentation

## 2013-11-09 DIAGNOSIS — I2510 Atherosclerotic heart disease of native coronary artery without angina pectoris: Secondary | ICD-10-CM | POA: Insufficient documentation

## 2013-11-09 DIAGNOSIS — I6529 Occlusion and stenosis of unspecified carotid artery: Secondary | ICD-10-CM | POA: Insufficient documentation

## 2013-11-09 DIAGNOSIS — I10 Essential (primary) hypertension: Secondary | ICD-10-CM | POA: Insufficient documentation

## 2013-11-09 DIAGNOSIS — Z951 Presence of aortocoronary bypass graft: Secondary | ICD-10-CM | POA: Insufficient documentation

## 2013-11-09 DIAGNOSIS — F17200 Nicotine dependence, unspecified, uncomplicated: Secondary | ICD-10-CM | POA: Insufficient documentation

## 2014-05-13 DIAGNOSIS — Z951 Presence of aortocoronary bypass graft: Secondary | ICD-10-CM | POA: Insufficient documentation

## 2015-11-18 ENCOUNTER — Encounter: Payer: Self-pay | Admitting: Cardiovascular Disease

## 2015-11-18 ENCOUNTER — Ambulatory Visit (INDEPENDENT_AMBULATORY_CARE_PROVIDER_SITE_OTHER): Payer: Managed Care, Other (non HMO) | Admitting: Cardiovascular Disease

## 2015-11-18 ENCOUNTER — Encounter: Payer: Self-pay | Admitting: *Deleted

## 2015-11-18 VITALS — BP 100/62 | HR 61 | Ht 62.0 in | Wt 140.0 lb

## 2015-11-18 DIAGNOSIS — I10 Essential (primary) hypertension: Secondary | ICD-10-CM

## 2015-11-18 DIAGNOSIS — I6521 Occlusion and stenosis of right carotid artery: Secondary | ICD-10-CM | POA: Diagnosis not present

## 2015-11-18 DIAGNOSIS — Z9289 Personal history of other medical treatment: Secondary | ICD-10-CM

## 2015-11-18 DIAGNOSIS — I25708 Atherosclerosis of coronary artery bypass graft(s), unspecified, with other forms of angina pectoris: Secondary | ICD-10-CM

## 2015-11-18 DIAGNOSIS — Z951 Presence of aortocoronary bypass graft: Secondary | ICD-10-CM

## 2015-11-18 DIAGNOSIS — E785 Hyperlipidemia, unspecified: Secondary | ICD-10-CM

## 2015-11-18 DIAGNOSIS — R079 Chest pain, unspecified: Secondary | ICD-10-CM | POA: Diagnosis not present

## 2015-11-18 DIAGNOSIS — R5383 Other fatigue: Secondary | ICD-10-CM

## 2015-11-18 DIAGNOSIS — Z87898 Personal history of other specified conditions: Secondary | ICD-10-CM

## 2015-11-18 DIAGNOSIS — I5032 Chronic diastolic (congestive) heart failure: Secondary | ICD-10-CM

## 2015-11-18 MED ORDER — CARVEDILOL 3.125 MG PO TABS: 3.1250 mg | ORAL_TABLET | Freq: Two times a day (BID) | ORAL | Status: DC

## 2015-11-18 NOTE — Patient Instructions (Addendum)
   Decrease Coreg to 3.125mg  twice a day  - new sent to [facility] today. Continue all other medications.   Your physician has requested that you have a carotid duplex. This test is an ultrasound of the carotid arteries in your neck. It looks at blood flow through these arteries that supply the brain with blood. Allow one hour for this exam. There are no restrictions or special instructions. Your physician has requested that you have an exercise stress myoview. For further information please visit [email or web address] Please follow instruction sheet, as given. Office will contact with results via phone or letter.   Follow up after testing above.

## 2015-11-18 NOTE — Progress Notes (Signed)
Patient ID: [name], female   DOB: Nov 03, 1965, 50 y.o.   MRN: [id number]       CARDIOLOGY CONSULT NOTE  Patient ID: [name] MRN: [id number] DOB/AGE: 26-Sep-1966 50 y.o.  Admit date: (Not on file) Primary Physician CYNTHIA BUTLER, DO  Reason for Consultation: CAD with CABG  HPI: The patient is a 50 year old woman with a history of coronary artery disease who underwent  3 vessel CABG in 2009, right internal carotid artery stenosis, CVA, hypertension, hyperlipidemia , and tobacco abuse. The most recent cardiology evaluation I find was performed in May 2013.  Carotid Dopplers on 11/09/13 showed 50-69% right internal carotid artery stenosis and less than 50% left internal carotid artery stenosis.  She was reportedly hospitalized for acute diastolic CHF at [facility] earlier this month. I reviewed all relevant documentation pertaining to this hospitalization. She was diuresed with IV Lasix and lost approximately 1 kg. Labs demonstrated sodium 140, potassium 3.9, BUN 9, creatinine 0.53, BNP 641, normal troponin, white count 8.5, hemoglobin 11.2, platelets 407.  ECG on 10/29/15 showed sinus bradycardia, heart rate 52 bpm, with incomplete right bundle-branch block.  Echocardiogram at [facility] performed on 10/30/15 reportedly demonstrated normal left ventricular systolic function, EF 60-65%, diastolic dysfunction (ungraded), and mild mitral regurgitation.  Since her hospitalization, she has experienced a sensation of " chest fullness and tightness". It can occur both with and without exertion. She has also felt fatigued. She denies orthopnea , paroxysmal nocturnal dyspnea, and leg swelling. She has been taking carvedilol once per week and feels markedly fatigued after doing so. She is prescribed 25 mg twice daily.   She is set to leave for the [place] on Feb 9.     No Known Allergies  Current Outpatient Prescriptions  Medication Sig Dispense Refill  . ALPRAZolam  (XANAX) 0.5 MG tablet Take 0.5 mg by mouth 3 (three) times daily as needed. For anxiety    . ARIPiprazole (ABILIFY) 10 MG tablet Take 10 mg by mouth daily.      Marland Kitchen aspirin EC 81 MG tablet Take 81 mg by mouth daily.    . carvedilol (COREG) 25 MG tablet Take 25 mg by mouth 2 (two) times daily with a meal.     . cholecalciferol (VITAMIN D) 1000 units tablet Take 1,000 Units by mouth daily.    Marland Kitchen FLUoxetine (PROZAC) 40 MG capsule Take 40 mg by mouth daily.      . furosemide (LASIX) 20 MG tablet Take 1.5 tablets by mouth daily.     Marland Kitchen lisinopril (PRINIVIL,ZESTRIL) 40 MG tablet Take 40 mg by mouth daily.      . nitroGLYCERIN (NITROSTAT) 0.4 MG SL tablet Place 0.4 mg under the tongue every 5 (five) minutes x 3 doses as needed for chest pain (if no relief after 3rd dose, proceed to the ED for an evaluation).    Marland Kitchen omeprazole (PRILOSEC) 20 MG capsule Take 20 mg by mouth daily. For heartburn/GERD    . potassium chloride SA (K-DUR,KLOR-CON) 20 MEQ tablet Take 1 tablet by mouth daily.    . pravastatin (PRAVACHOL) 40 MG tablet Take 40 mg by mouth daily.      Marland Kitchen PROAIR HFA 108 (90 Base) MCG/ACT inhaler Inhale 2 puffs into the lungs every 6 (six) hours as needed.  3  . rOPINIRole (REQUIP) 1 MG tablet Take 1 mg by mouth at bedtime.       No current facility-administered medications for this visit.    Past Medical History  Diagnosis Date  . Coronary artery disease     s/p 3V CABG '09  . Hypertension   . GERD (gastroesophageal reflux disease)   . Anxiety   . Fibromyalgia   . Depression   . Hyperlipidemia   . Tobacco abuse   . Carotid artery disease (HCC)     Right 50-69% stenosis by doppler 2010  . ADD (attention deficit disorder with hyperactivity)   . Bipolar 1 disorder (HCC)   . CVA (cerebral infarction) 12/2008    Reports a neurologist told her she had a blood clot in her brain    Past Surgical History  Procedure Laterality Date  . Coronary artery bypass graft  2009    3V CABG LIMA --> LAD, SVG  --> Ramus Intermediate,  SVG --> RCA  . Cardiac catheterization    . Tubal ligation  1990    Social History   Social History  . Marital Status: Divorced    Spouse Name: N/A  . Number of Children: N/A  . Years of Education: N/A   Occupational History  . Unemployed     Applicatin for disability pending, inadequate funds to afford medical care   Social History Main Topics  . Smoking status: Current Every Day Smoker -- 1.00 packs/day for 35 years    Types: Cigarettes    Start date: 12/14/1981  . Smokeless tobacco: Never Used  . Alcohol Use: 4.2 oz/week    7 Cans of beer per week  . Drug Use: No  . Sexual Activity: Yes   Other Topics Concern  . Not on file   Social History Narrative   Divorced with 2 children     No family history of premature CAD in 1st degree relatives.  Prior to Admission medications   Medication Sig Start Date End Date Taking? Authorizing Provider  ALPRAZolam Prudy Feeler) 0.5 MG tablet Take 0.5 mg by mouth 3 (three) times daily as needed. For anxiety   Yes Historical Provider, MD  ARIPiprazole (ABILIFY) 10 MG tablet Take 10 mg by mouth daily.     Yes Historical Provider, MD  aspirin EC 81 MG tablet Take 81 mg by mouth daily.   Yes Historical Provider, MD  carvedilol (COREG) 25 MG tablet Take 25 mg by mouth 2 (two) times daily with a meal.    Yes Historical Provider, MD  cholecalciferol (VITAMIN D) 1000 units tablet Take 1,000 Units by mouth daily.   Yes Historical Provider, MD  FLUoxetine (PROZAC) 40 MG capsule Take 40 mg by mouth daily.     Yes Historical Provider, MD  furosemide (LASIX) 20 MG tablet Take 1.5 tablets by mouth daily.  10/31/15  Yes Historical Provider, MD  lisinopril (PRINIVIL,ZESTRIL) 40 MG tablet Take 40 mg by mouth daily.     Yes Historical Provider, MD  nitroGLYCERIN (NITROSTAT) 0.4 MG SL tablet Place 0.4 mg under the tongue every 5 (five) minutes x 3 doses as needed for chest pain (if no relief after 3rd dose, proceed to the ED for an  evaluation).   Yes Historical Provider, MD  omeprazole (PRILOSEC) 20 MG capsule Take 20 mg by mouth daily. For heartburn/GERD   Yes Historical Provider, MD  potassium chloride SA (K-DUR,KLOR-CON) 20 MEQ tablet Take 1 tablet by mouth daily. 10/31/15  Yes Historical Provider, MD  pravastatin (PRAVACHOL) 40 MG tablet Take 40 mg by mouth daily.     Yes Historical Provider, MD  PROAIR HFA 108 [phone] Base) MCG/ACT inhaler Inhale 2 puffs into the lungs every  6 (six) hours as needed. 08/22/15  Yes Historical Provider, MD  rOPINIRole (REQUIP) 1 MG tablet Take 1 mg by mouth at bedtime.     Yes Historical Provider, MD     Review of systems complete and found to be negative unless listed above in HPI     Physical exam Blood pressure 100/62, pulse 61, height  (1.575 m), weight 140 lb (63.504 kg). General: NAD Neck: No JVD, no thyromegaly or thyroid nodule.  Lungs: Clear to auscultation bilaterally with normal respiratory effort. CV: Nondisplaced PMI. Regular rate and rhythm, normal S1/S2, no S3/S4, no murmur.  No peripheral edema.  No carotid bruit.    Abdomen: Soft, nontender, no hepatosplenomegaly, no distention.  Skin: Intact without lesions or rashes.  Neurologic: Alert and oriented x 3.  Psych: Normal affect. Extremities: No clubbing or cyanosis.  HEENT: Normal.   ECG: Most recent ECG reviewed.  Labs:   Lab Results  Component Value Date   WBC 11.2* 03/01/2012   HGB 12.9 03/01/2012   HCT 38.5 03/01/2012   MCV 94.1 03/01/2012   PLT 264 03/01/2012   No results for input(s): NA, K, CL, CO2, BUN, CREATININE, CALCIUM, PROT, BILITOT, ALKPHOS, ALT, AST, GLUCOSE in the last 168 hours.  Invalid input(s): LABALBU Lab Results  Component Value Date   CKTOTAL 32 03/01/2012   CKMB 1.6 03/01/2012   TROPONINI <0.30 03/01/2012    Lab Results  Component Value Date   CHOL 178 09/29/2009   CHOL 178 09/29/2009   CHOL * 10/08/2008    203        ATP III CLASSIFICATION:  <200     mg/dL    Desirable  161-096  mg/dL   Borderline High  >=045    mg/dL   High   Lab Results  Component Value Date   HDL 43 09/29/2009   HDL 43 09/29/2009   HDL 34* 10/08/2008   Lab Results  Component Value Date   LDLCALC 98 09/29/2009   LDLCALC 98 09/29/2009   LDLCALC * 10/08/2008    130        Total Cholesterol/HDL:CHD Risk Coronary Heart Disease Risk Table                     Men   Women  1/2 Average Risk   3.4   3.3   Lab Results  Component Value Date   TRIG 185* 09/29/2009   TRIG 185 09/29/2009   TRIG 194* 10/08/2008   Lab Results  Component Value Date   CHOLHDL 4.1 Ratio 09/29/2009   CHOLHDL 6.0 10/08/2008   No results found for: LDLDIRECT       Studies: No results found.  ASSESSMENT AND PLAN:  1. Chest tightness and fatigue in context of CAD with CABG: Symptoms concerning for potentially new stenoses. Will obtain an exercise Cardiolite stress test. Due to marked fatigue after Coreg, will reduce dose to 3.125 mg bid. Continue ASA and statin. LVEF is normal.  2. Essential HTN: Controlled. Monitor given reduction in Coreg dose.  3. Bilateral carotid artery disease: Most recent results from 2015 noted above. Will repeat. Continue ASA and statin.  4. Hyperlipidemia: Obtain copy of lipids from PCP. Continue pravastatin 40 mg.  5. Chronic diastolic heart failure: Euvolemic on Lasix 30 mg daily. No changes.  Dispo: f/u after stress test and prior to 2/9 with me.   Signed: Prentice Docker, M.D., F.A.C.C.  11/18/2015, 10:35 AM

## 2015-11-19 ENCOUNTER — Encounter: Payer: Self-pay | Admitting: *Deleted

## 2015-11-20 ENCOUNTER — Ambulatory Visit: Payer: Managed Care, Other (non HMO)

## 2015-11-20 DIAGNOSIS — I6521 Occlusion and stenosis of right carotid artery: Secondary | ICD-10-CM | POA: Diagnosis not present

## 2015-11-24 ENCOUNTER — Encounter (HOSPITAL_COMMUNITY)
Admission: RE | Admit: 2015-11-24 | Discharge: 2015-11-24 | Disposition: A | Discharge status: Home / Self Care | Payer: Managed Care, Other (non HMO) | Source: Ambulatory Visit | Attending: Cardiovascular Disease | Admitting: Cardiovascular Disease

## 2015-11-24 ENCOUNTER — Encounter (HOSPITAL_COMMUNITY): Payer: Self-pay

## 2015-11-24 ENCOUNTER — Inpatient Hospital Stay (HOSPITAL_COMMUNITY): Admission: RE | Admit: 2015-11-24 | Payer: Managed Care, Other (non HMO) | Source: Ambulatory Visit

## 2015-11-24 DIAGNOSIS — Z951 Presence of aortocoronary bypass graft: Secondary | ICD-10-CM | POA: Diagnosis not present

## 2015-11-24 DIAGNOSIS — R079 Chest pain, unspecified: Secondary | ICD-10-CM | POA: Insufficient documentation

## 2015-11-24 LAB — NM MYOCAR MULTI W/SPECT W/WALL MOTION / EF
CHL CUP NUCLEAR SDS: 9
CHL CUP RESTING HR STRESS: 53 {beats}/min
CSEPEDS: 43 s
Estimated workload: 10.1 METS
Exercise duration (min): 8 min
LV dias vol: 55 mL
LV sys vol: 16 mL
MPHR: 171 {beats}/min
Peak HR: 151 {beats}/min
Percent HR: 88 %
RATE: 0.3
RPE: 17
SRS: 9
SSS: 18
TID: 0.73

## 2015-11-24 MED ORDER — TECHNETIUM TC 99M SESTAMIBI - CARDIOLITE
30.0000 | Freq: Once | INTRAVENOUS | Status: AC | PRN
Start: 2015-11-24 — End: 2015-11-24
  Administered 2015-11-24: 11:00:00 30 via INTRAVENOUS

## 2015-11-24 MED ORDER — SODIUM CHLORIDE 0.9% FLUSH
INTRAVENOUS | Status: AC
  Administered 2015-11-24: 10 mL via INTRAVENOUS
  Filled 2015-11-24: qty 10

## 2015-11-24 MED ORDER — REGADENOSON 0.4 MG/5ML IV SOLN
INTRAVENOUS | Status: AC
  Filled 2015-11-24: qty 5

## 2015-11-24 MED ORDER — TECHNETIUM TC 99M SESTAMIBI GENERIC - CARDIOLITE
10.0000 | Freq: Once | INTRAVENOUS | Status: AC | PRN
  Administered 2015-11-24: 10 via INTRAVENOUS

## 2015-11-26 ENCOUNTER — Telehealth: Payer: Self-pay | Admitting: *Deleted

## 2015-11-26 NOTE — Telephone Encounter (Signed)
STRESS TEST -   Notes Recorded by Laqueta Linden, MD on 11/24/2015 at 3:08 PM Low risk.  CAROTID DOPPLER -   Notes Recorded by Laqueta Linden, MD on 11/21/2015 at 5:29 PM Very mild blockages. Repeat in 2 years.

## 2015-11-26 NOTE — Telephone Encounter (Signed)
Notes Recorded by Lesle Chris, LPN on 10/30/1094 at 11:30 AM Patient notified, copy to pmd.  Already has follow up scheduled for 11/28/15 with Dr. Purvis Sheffield [facility] office.

## 2015-11-28 ENCOUNTER — Ambulatory Visit (INDEPENDENT_AMBULATORY_CARE_PROVIDER_SITE_OTHER): Payer: Managed Care, Other (non HMO) | Admitting: Cardiovascular Disease

## 2015-11-28 ENCOUNTER — Encounter: Payer: Self-pay | Admitting: Cardiovascular Disease

## 2015-11-28 VITALS — BP 108/60 | HR 56 | Ht 62.0 in | Wt 140.0 lb

## 2015-11-28 DIAGNOSIS — R5383 Other fatigue: Secondary | ICD-10-CM | POA: Diagnosis not present

## 2015-11-28 DIAGNOSIS — R079 Chest pain, unspecified: Secondary | ICD-10-CM | POA: Diagnosis not present

## 2015-11-28 DIAGNOSIS — E785 Hyperlipidemia, unspecified: Secondary | ICD-10-CM | POA: Diagnosis not present

## 2015-11-28 DIAGNOSIS — Z951 Presence of aortocoronary bypass graft: Secondary | ICD-10-CM | POA: Diagnosis not present

## 2015-11-28 DIAGNOSIS — I6521 Occlusion and stenosis of right carotid artery: Secondary | ICD-10-CM

## 2015-11-28 DIAGNOSIS — I10 Essential (primary) hypertension: Secondary | ICD-10-CM

## 2015-11-28 DIAGNOSIS — I25708 Atherosclerosis of coronary artery bypass graft(s), unspecified, with other forms of angina pectoris: Secondary | ICD-10-CM

## 2015-11-28 DIAGNOSIS — I5032 Chronic diastolic (congestive) heart failure: Secondary | ICD-10-CM

## 2015-11-28 MED ORDER — ATORVASTATIN CALCIUM 80 MG PO TABS: 80.0000 mg | ORAL_TABLET | Freq: Every day | ORAL | Status: DC

## 2015-11-28 MED ORDER — FUROSEMIDE 20 MG PO TABS: ORAL_TABLET | ORAL | Status: DC

## 2015-11-28 NOTE — Progress Notes (Signed)
Patient ID: [name], female   DOB: 1966-02-27, 50 y.o.   MRN: [id number]      SUBJECTIVE: The patient returns for follow-up after undergoing cardiovascular testing performed for the evaluation of chest pain and fatigue. Nuclear stress test on 11/24/15 was low risk, LVEF 71%. There was both GI and breast attenuation artifact noted. Duke treadmill score of 8.5 was also low risk. Carotid artery Dopplers on 11/20/15 demonstrated minimal bilateral disease.  Review of lipids performed on 11/06/15 showed elevated LDL 158, triglycerides 219, total cholesterol 252, HDL 51.  She feels less fatigued after the reduction of carvedilol dose.  Notices ankles swell by evening on Lasix 30 mg daily.   Review of Systems: As per "subjective", otherwise negative.  No Known Allergies  Current Outpatient Prescriptions  Medication Sig Dispense Refill  . ALPRAZolam (XANAX) 0.5 MG tablet Take 0.5 mg by mouth 3 (three) times daily as needed. For anxiety    . ARIPiprazole (ABILIFY) 10 MG tablet Take 10 mg by mouth daily.      Marland Kitchen aspirin EC 81 MG tablet Take 81 mg by mouth daily.    . carvedilol (COREG) 3.125 MG tablet Take 1 tablet (3.125 mg total) by mouth 2 (two) times daily. 60 tablet 6  . cholecalciferol (VITAMIN D) 1000 units tablet Take 1,000 Units by mouth daily.    Marland Kitchen FLUoxetine (PROZAC) 40 MG capsule Take 40 mg by mouth daily.      . furosemide (LASIX) 20 MG tablet Take 1.5 tablets by mouth daily.     Marland Kitchen lisinopril (PRINIVIL,ZESTRIL) 40 MG tablet Take 40 mg by mouth daily.      . nitroGLYCERIN (NITROSTAT) 0.4 MG SL tablet Place 0.4 mg under the tongue every 5 (five) minutes x 3 doses as needed for chest pain (if no relief after 3rd dose, proceed to the ED for an evaluation).    Marland Kitchen omeprazole (PRILOSEC) 20 MG capsule Take 20 mg by mouth daily. For heartburn/GERD    . potassium chloride SA (K-DUR,KLOR-CON) 20 MEQ tablet Take 1 tablet by mouth daily.    . pravastatin (PRAVACHOL) 40 MG tablet Take 40 mg  by mouth daily.      Marland Kitchen PROAIR HFA 108 (90 Base) MCG/ACT inhaler Inhale 2 puffs into the lungs every 6 (six) hours as needed.  3  . rOPINIRole (REQUIP) 1 MG tablet Take 1 mg by mouth at bedtime.       No current facility-administered medications for this visit.    Past Medical History  Diagnosis Date  . Coronary artery disease     s/p 3V CABG '09  . Hypertension   . GERD (gastroesophageal reflux disease)   . Anxiety   . Fibromyalgia   . Depression   . Hyperlipidemia   . Tobacco abuse   . Carotid artery disease (HCC)     Right 50-69% stenosis by doppler 2010  . ADD (attention deficit disorder with hyperactivity)   . Bipolar 1 disorder (HCC)   . CVA (cerebral infarction) 12/2008    Reports a neurologist told her she had a blood clot in her brain    Past Surgical History  Procedure Laterality Date  . Coronary artery bypass graft  2009    3V CABG LIMA --> LAD, SVG --> Ramus Intermediate,  SVG --> RCA  . Cardiac catheterization    . Tubal ligation  1990    Social History   Social History  . Marital Status: Married    Spouse Name: N/A  .  Number of Children: N/A  . Years of Education: N/A   Occupational History  . Unemployed     Applicatin for disability pending, inadequate funds to afford medical care   Social History Main Topics  . Smoking status: Current Every Day Smoker -- 0.25 packs/day for 35 years    Types: Cigarettes    Start date: 12/14/1981  . Smokeless tobacco: Never Used  . Alcohol Use: 4.2 oz/week    7 Cans of beer per week  . Drug Use: No  . Sexual Activity: Yes   Other Topics Concern  . Not on file   Social History Narrative   Divorced with 2 children     Filed Vitals:   11/28/15 0814  BP: 108/60  Pulse: 56  Height:  (1.575 m)  Weight: 140 lb (63.504 kg)  SpO2: 99%    PHYSICAL EXAM General: NAD HEENT: Normal. Neck: No JVD, no thyromegaly. Lungs: Clear to auscultation bilaterally with normal respiratory effort. CV: Nondisplaced  PMI.  Regular rate and rhythm, normal S1/S2, no S3/S4, no murmur. No pretibial or periankle edema.  No carotid bruit.   Abdomen: Soft, nontender, no distention.  Neurologic: Alert and oriented.  Psych: Normal affect. Skin: Normal. Musculoskeletal: No gross deformities.  ECG: Most recent ECG reviewed.      ASSESSMENT AND PLAN: 1. Chest tightness and fatigue in context of CAD with CABG: Symptoms have improved but not resolved completely. BP low normal and bradycardic. Will d/c Coreg altogether. Exercise Cardiolite stress test low risk as noted above. Continue ASA and statin. LVEF is normal.  2. Essential HTN: Controlled. Monitor given discontinuation of Coreg.  3. Bilateral carotid artery disease: Most recent results noted above. Will repeat in 2 years. Continue ASA and statin.  4. Hyperlipidemia: LDL 158 on 11/06/15. Switch pravastatin 40 mg to Lipitor 80 mg and repeat lipids in 3 months..  5. Chronic diastolic heart failure: Increase Lasix to 20 mg bid.  Dispo: f/u 3 months.   Prentice Docker, M.D., F.A.C.C.

## 2015-11-28 NOTE — Patient Instructions (Addendum)
Your physician recommends that you schedule a follow-up appointment in: 3 months with Dr Purvis Sheffield   STOP Coreg  STOP Pravastatin  START Atorvastatin (Lipitor )  80 mg at dinner   TAKE Lasix 20 mg daily As NEEDED for leg swelling   JUST BEFORE next visit, get FASTING lipid blood work     If you need a refill on your cardiac medications before your next appointment, please call your pharmacy.   Thank you for choosing [facility] !

## 2015-12-01 ENCOUNTER — Ambulatory Visit: Payer: Managed Care, Other (non HMO) | Admitting: Cardiovascular Disease

## 2015-12-11 ENCOUNTER — Telehealth: Payer: Self-pay | Admitting: Cardiovascular Disease

## 2015-12-11 MED ORDER — ROSUVASTATIN CALCIUM 5 MG PO TABS: 5.0000 mg | ORAL_TABLET | Freq: Every day | ORAL | Status: DC

## 2015-12-11 NOTE — Telephone Encounter (Signed)
Pt says Lipitor causing muscles aches and cramps, says she stopped taking yesterday. Will forward to Dr. Purvis Sheffield

## 2015-12-11 NOTE — Telephone Encounter (Signed)
Lipitor she can not take due to not agreeing with her

## 2015-12-11 NOTE — Telephone Encounter (Signed)
Pt aware, medication sent to [facility].

## 2015-12-11 NOTE — Telephone Encounter (Signed)
Try low-dose Crestor 5 mg with repeat lipids in 3 months.

## 2016-03-01 ENCOUNTER — Ambulatory Visit: Payer: Managed Care, Other (non HMO) | Admitting: Cardiovascular Disease

## 2016-03-05 ENCOUNTER — Encounter: Payer: Self-pay | Admitting: Cardiovascular Disease

## 2016-03-05 ENCOUNTER — Ambulatory Visit (INDEPENDENT_AMBULATORY_CARE_PROVIDER_SITE_OTHER): Payer: Managed Care, Other (non HMO) | Admitting: Cardiovascular Disease

## 2016-03-05 ENCOUNTER — Ambulatory Visit: Payer: Managed Care, Other (non HMO) | Admitting: Cardiovascular Disease

## 2016-03-05 ENCOUNTER — Encounter: Payer: Self-pay | Admitting: *Deleted

## 2016-03-05 VITALS — BP 142/78 | HR 54 | Ht 62.0 in | Wt 146.0 lb

## 2016-03-05 DIAGNOSIS — E785 Hyperlipidemia, unspecified: Secondary | ICD-10-CM | POA: Diagnosis not present

## 2016-03-05 DIAGNOSIS — R079 Chest pain, unspecified: Secondary | ICD-10-CM | POA: Diagnosis not present

## 2016-03-05 DIAGNOSIS — I5032 Chronic diastolic (congestive) heart failure: Secondary | ICD-10-CM

## 2016-03-05 DIAGNOSIS — I25708 Atherosclerosis of coronary artery bypass graft(s), unspecified, with other forms of angina pectoris: Secondary | ICD-10-CM

## 2016-03-05 DIAGNOSIS — R5383 Other fatigue: Secondary | ICD-10-CM

## 2016-03-05 DIAGNOSIS — Z951 Presence of aortocoronary bypass graft: Secondary | ICD-10-CM | POA: Diagnosis not present

## 2016-03-05 DIAGNOSIS — I6521 Occlusion and stenosis of right carotid artery: Secondary | ICD-10-CM

## 2016-03-05 DIAGNOSIS — I10 Essential (primary) hypertension: Secondary | ICD-10-CM

## 2016-03-05 DIAGNOSIS — R001 Bradycardia, unspecified: Secondary | ICD-10-CM

## 2016-03-05 MED ORDER — RANOLAZINE ER 500 MG PO TB12: 500.0000 mg | ORAL_TABLET | Freq: Two times a day (BID) | ORAL | Status: DC

## 2016-03-05 NOTE — Progress Notes (Signed)
Patient ID: Monica Schilleramela C House, female   DOB: November 19, 1965, 50 y.o.   MRN: [id number]      SUBJECTIVE: The patient presents for follow-up of chest pain and coronary artery disease. She has a new position at the same company which has been more strenuous and she has been having to work 50-60 hours per week. She has been experiencing exertional chest tightness and rating at 8 out of 10. She did feel much better off of Coreg with less fatigue. She has been afraid to take sublingual nitroglycerin due to headaches. She has not been able to get her lipids repeated due to her work schedule. She describes the chest pain as a dull ache and in the left inframammary region and radiating into the back. She denies chest pain at rest.   Review of Systems: As per "subjective", otherwise negative.  No Known Allergies  Current Outpatient Prescriptions  Medication Sig Dispense Refill  . ALPRAZolam (XANAX) 0.5 MG tablet Take 0.5 mg by mouth 3 (three) times daily as needed. For anxiety    . ARIPiprazole (ABILIFY) 10 MG tablet Take 10 mg by mouth daily.      Marland Kitchen. aspirin EC 81 MG tablet Take 81 mg by mouth daily.    . cholecalciferol (VITAMIN D) 1000 units tablet Take 1,000 Units by mouth daily.    Marland Kitchen. FLUoxetine (PROZAC) 40 MG capsule Take 40 mg by mouth daily.      . furosemide (LASIX) 20 MG tablet Take 20 mg daily as needed for leg swelling 90 tablet 3  . lisinopril (PRINIVIL,ZESTRIL) 40 MG tablet Take 40 mg by mouth daily.      . nitroGLYCERIN (NITROSTAT) 0.4 MG SL tablet Place 0.4 mg under the tongue every 5 (five) minutes x 3 doses as needed for chest pain (if no relief after 3rd dose, proceed to the ED for an evaluation).    Marland Kitchen. omeprazole (PRILOSEC) 20 MG capsule Take 20 mg by mouth daily. For heartburn/GERD    . potassium chloride SA (K-DUR,KLOR-CON) 20 MEQ tablet Take 1 tablet by mouth daily.    Marland Kitchen. PROAIR HFA 108 (90 Base) MCG/ACT inhaler Inhale 2 puffs into the lungs every 6 (six) hours as needed.  3  . rOPINIRole  (REQUIP) 1 MG tablet Take 1 mg by mouth at bedtime.      . rosuvastatin (CRESTOR) 5 MG tablet Take 1 tablet (5 mg total) by mouth daily. 30 tablet 3   No current facility-administered medications for this visit.    Past Medical History  Diagnosis Date  . Coronary artery disease     s/p 3V CABG '09  . Hypertension   . GERD (gastroesophageal reflux disease)   . Anxiety   . Fibromyalgia   . Depression   . Hyperlipidemia   . Tobacco abuse   . Carotid artery disease (HCC)     Right 50-69% stenosis by doppler 2010  . ADD (attention deficit disorder with hyperactivity)   . Bipolar 1 disorder (HCC)   . CVA (cerebral infarction) 12/2008    Reports a neurologist told her she had a blood clot in her brain    Past Surgical History  Procedure Laterality Date  . Coronary artery bypass graft  2009    3V CABG LIMA --> LAD, SVG --> Ramus Intermediate,  SVG --> RCA  . Cardiac catheterization    . Tubal ligation  1990    Social History   Social History  . Marital Status: Married    Spouse Name:  N/A  . Number of Children: N/A  . Years of Education: N/A   Occupational History  . Unemployed     Applicatin for disability pending, inadequate funds to afford medical care   Social History Main Topics  . Smoking status: Former Smoker -- 0.25 packs/day for 35 years    Types: Cigarettes    Start date: 12/14/1981    Quit date: 01/04/2016  . Smokeless tobacco: Never Used  . Alcohol Use: 4.2 oz/week    7 Cans of beer per week  . Drug Use: No  . Sexual Activity: Yes   Other Topics Concern  . Not on file   Social History Narrative   Divorced with 2 children     Filed Vitals:   03/05/16 0916  BP: 142/78  Pulse: 54  Height:  (1.575 m)  Weight: 146 lb (66.225 kg)  SpO2: 98%    PHYSICAL EXAM General: NAD HEENT: Normal. Neck: No JVD, no thyromegaly. Lungs: Clear to auscultation bilaterally with normal respiratory effort. CV: Nondisplaced PMI.  Regular rate and rhythm,  normal S1/S2, no S3/S4, no murmur. No pretibial or periankle edema.  No carotid bruit.   Abdomen: Soft, nontender, no distention.  Neurologic: Alert and oriented.  Psych: Normal affect. Skin: Normal. Musculoskeletal: No gross deformities.    ECG: Most recent ECG reviewed.      ASSESSMENT AND PLAN: 1. Chest pain and fatigue in context of CAD with CABG: Continues to have exertional angina. Less fatigued off of Coreg. Will start Ranexa 500 mg BID. Exercise Cardiolite stress test was low risk 11/24/15. Continue ASA and statin. LVEF is normal. Will provide a letter limiting work to no more than 40 hours per week.  2. Essential HTN: Mildly elevated. No changes.  3. Bilateral carotid artery disease: Most recent results reviewed at last visit. Will repeat in 2 years. Continue ASA and statin.  4. Hyperlipidemia: LDL 158 on 11/06/15. Did not tolerate Lipitor. Now on Crestor 5 mg with repeat lipids due. Will prov.  5. Chronic diastolic heart failure: Euvolemic on Lasix 20 mg bid. No changes.  6. Bradycardia: Persists being off of Coreg. Will monitor.  Dispo: f/u 3 months.   Prentice Docker, M.D., F.A.C.C.

## 2016-03-05 NOTE — Patient Instructions (Signed)
   Begin Ranexa 500mg  twice a day  - samples, co-pay coupon card, & printed script given today. Continue all other medications.   Work note provided today to limit patient to 40 hours per week only. Follow up in 3 months.

## 2016-03-16 ENCOUNTER — Telehealth: Payer: Self-pay | Admitting: Cardiovascular Disease

## 2016-03-16 ENCOUNTER — Ambulatory Visit (INDEPENDENT_AMBULATORY_CARE_PROVIDER_SITE_OTHER): Payer: Managed Care, Other (non HMO) | Admitting: Cardiovascular Disease

## 2016-03-16 ENCOUNTER — Encounter: Payer: Self-pay | Admitting: Cardiovascular Disease

## 2016-03-16 VITALS — BP 118/56 | HR 57 | Ht 62.0 in | Wt 143.0 lb

## 2016-03-16 DIAGNOSIS — I10 Essential (primary) hypertension: Secondary | ICD-10-CM

## 2016-03-16 DIAGNOSIS — E785 Hyperlipidemia, unspecified: Secondary | ICD-10-CM | POA: Diagnosis not present

## 2016-03-16 DIAGNOSIS — Z951 Presence of aortocoronary bypass graft: Secondary | ICD-10-CM | POA: Diagnosis not present

## 2016-03-16 DIAGNOSIS — R002 Palpitations: Secondary | ICD-10-CM

## 2016-03-16 DIAGNOSIS — R0789 Other chest pain: Secondary | ICD-10-CM

## 2016-03-16 DIAGNOSIS — I25708 Atherosclerosis of coronary artery bypass graft(s), unspecified, with other forms of angina pectoris: Secondary | ICD-10-CM

## 2016-03-16 DIAGNOSIS — I5032 Chronic diastolic (congestive) heart failure: Secondary | ICD-10-CM

## 2016-03-16 DIAGNOSIS — R001 Bradycardia, unspecified: Secondary | ICD-10-CM

## 2016-03-16 NOTE — Telephone Encounter (Signed)
Patient having pressure and SOB. Started prior to visit last week and seems to be getting worse.  Stated she feel she has fluid around her heart .  Started her lasix back yesterday. Going to sleep about 9am.  She works 3rd shift and will sleep til late afternoon.

## 2016-03-16 NOTE — Patient Instructions (Signed)
Your physician recommends that you schedule a follow-up appointment in: 3 Weeks with Dr. Purvis SheffieldKoneswaran  Your physician has requested that you have an echocardiogram. Echocardiography is a painless test that uses sound waves to create images of your heart. It provides your doctor with information about the size and shape of your heart and how well your heart's chambers and valves are working. This procedure takes approximately one hour. There are no restrictions for this procedure.  Your physician recommends that you continue on your current medications as directed. Please refer to the Current Medication list given to you today.  If you need a refill on your cardiac medications before your next appointment, please call your pharmacy.  Thank you for choosing [facility]!

## 2016-03-16 NOTE — Progress Notes (Signed)
Patient ID: [name], female   DOB: 07-12-1966, 50 y.o.   MRN: [id number]      SUBJECTIVE: The patient presents for evaluation of shortness of breath and right-sided chest pressure. This developed last night while she was at work. She took Lasix 20 mg yesterday. She said her legs and fingers have been swelling since Friday and has a overall sensation of "fullness" in her chest.  She has a history of coronary artery disease and I most recently evaluated her on 03/05/16. At that time she had been complaining of exertional chest tightness for which I started Ranexa.  Wt today: 143 lbs ([place] office)  Wt 5/12: 146 lbs [facility] office)  She said the previous symptoms occurred spontaneously. She also felt forceful palpitations in the retrosternal region. Denies orthopnea and paroxysmal nocturnal dyspnea.     Review of Systems: As per "subjective", otherwise negative.  No Known Allergies  Current Outpatient Prescriptions  Medication Sig Dispense Refill  . ALPRAZolam (XANAX) 0.5 MG tablet Take 0.5 mg by mouth 3 (three) times daily as needed. For anxiety    . ARIPiprazole (ABILIFY) 10 MG tablet Take 10 mg by mouth daily.      Marland Kitchen aspirin EC 81 MG tablet Take 81 mg by mouth daily.    . cholecalciferol (VITAMIN D) 1000 units tablet Take 1,000 Units by mouth daily.    Marland Kitchen FLUoxetine (PROZAC) 40 MG capsule Take 40 mg by mouth daily.      . furosemide (LASIX) 20 MG tablet Take 20 mg daily as needed for leg swelling 90 tablet 3  . lisinopril (PRINIVIL,ZESTRIL) 40 MG tablet Take 40 mg by mouth daily.      . nitroGLYCERIN (NITROSTAT) 0.4 MG SL tablet Place 0.4 mg under the tongue every 5 (five) minutes x 3 doses as needed for chest pain (if no relief after 3rd dose, proceed to the ED for an evaluation).    Marland Kitchen omeprazole (PRILOSEC) 20 MG capsule Take 20 mg by mouth daily. For heartburn/GERD    . potassium chloride SA (K-DUR,KLOR-CON) 20 MEQ tablet Take 1 tablet by mouth daily.    Marland Kitchen PROAIR HFA 108  (90 Base) MCG/ACT inhaler Inhale 2 puffs into the lungs every 6 (six) hours as needed.  3  . ranolazine (RANEXA) 500 MG 12 hr tablet Take 1 tablet (500 mg total) by mouth 2 (two) times daily. 60 tablet 6  . rOPINIRole (REQUIP) 1 MG tablet Take 1 mg by mouth at bedtime.      . rosuvastatin (CRESTOR) 5 MG tablet Take 1 tablet (5 mg total) by mouth daily. 30 tablet 3   No current facility-administered medications for this visit.    Past Medical History  Diagnosis Date  . Coronary artery disease     s/p 3V CABG '09  . Hypertension   . GERD (gastroesophageal reflux disease)   . Anxiety   . Fibromyalgia   . Depression   . Hyperlipidemia   . Tobacco abuse   . Carotid artery disease (HCC)     Right 50-69% stenosis by doppler 2010  . ADD (attention deficit disorder with hyperactivity)   . Bipolar 1 disorder (HCC)   . CVA (cerebral infarction) 12/2008    Reports a neurologist told her she had a blood clot in her brain    Past Surgical History  Procedure Laterality Date  . Coronary artery bypass graft  2009    3V CABG LIMA --> LAD, SVG --> Ramus Intermediate,  SVG --> RCA  .  Cardiac catheterization    . Tubal ligation  1990    Social History   Social History  . Marital Status: Married    Spouse Name: N/A  . Number of Children: N/A  . Years of Education: N/A   Occupational History  . Unemployed     Applicatin for disability pending, inadequate funds to afford medical care   Social History Main Topics  . Smoking status: Former Smoker -- 0.25 packs/day for 35 years    Types: Cigarettes    Start date: 12/14/1981    Quit date: 01/04/2016  . Smokeless tobacco: Never Used  . Alcohol Use: 4.2 oz/week    7 Cans of beer per week  . Drug Use: No  . Sexual Activity: Yes   Other Topics Concern  . Not on file   Social History Narrative   Divorced with 2 children     Filed Vitals:   03/16/16 0959  BP: 118/56  Pulse: 57  Height: 5\' 2"  (1.575 m)  Weight: 143 lb (64.864 kg)    SpO2: 98%    PHYSICAL EXAM General: NAD HEENT: Normal. Neck: No JVD, no thyromegaly. Lungs: Clear to auscultation bilaterally with normal respiratory effort. CV: Nondisplaced PMI.  Regular rate and rhythm, normal S1/S2, no S3/S4, no murmur. No pretibial or periankle edema.  No carotid bruit.   Abdomen: Soft, nontender, no distention.  Neurologic: Alert and oriented.  Psych: Normal affect. Skin: Normal. Musculoskeletal: No gross deformities.    ECG: Most recent ECG reviewed.      ASSESSMENT AND PLAN:  1. Shortness of breath and leg swelling: No edema today. I will order a 2-D echocardiogram with Doppler to evaluate cardiac structure, function, and regional wall motion.  2. Chest pain and fatigue in context of CAD with CABG: Relieved with initiation of Ranexa 500 mg BID. Exercise Cardiolite stress test was low risk 11/24/15. Continue ASA and statin. LVEF is normal. Will repeat echo.  3. Essential HTN: Controlled. No changes.  4. Bilateral carotid artery disease: Most recent results reviewed at last visit. Will repeat in 2 years. Continue ASA and statin.  5. Hyperlipidemia: LDL 158 on 11/06/15. Did not tolerate Lipitor. Now on Crestor 5 mg with repeat lipids due.   6. Chronic diastolic heart failure: Euvolemic on Lasix 20 mg daily (just began). Wt stable.   7. Bradycardia: Persists being off of Coreg. Will monitor.  8. Palpitations: If they persist, will obtain Holter monitor.  Dispo: f/u 3 weeks.  Prentice DockerSuresh Isys Tietje, M.D., F.A.C.C.

## 2016-03-16 NOTE — Telephone Encounter (Signed)
Pt would like to be seen, has not been taking lasix 20 mg bid, on medication list it was 20 mg prn swelling, [place] in [place] office at 10AM. Message sent to schedulers and nurse.

## 2016-03-16 NOTE — Telephone Encounter (Signed)
Had she been taking Lasix 20 mg bid since her visit with me? If not, would do so. If she has already been doing that, can increase Lasix to 40 mg bid x 3 days with supplemental KCl 20 meq daily x 3 days. Obtain echocardiogram to assess for change in cardiac function. My 10 am slot dropped off today and I could see her then if need be.

## 2016-03-16 NOTE — Telephone Encounter (Signed)
Pt says having little SOB and right sided chest pressure starting last night while pt was at work. Pt took lasix 20 mg yesterday and will take another today. Says legs and fingers have been swelling since Friday ans says she has a "full" feeling. Pt is taking Ranexa 500mg  bid since LOV. Will forward to Dr. Purvis SheffieldKoneswaran

## 2016-03-17 ENCOUNTER — Other Ambulatory Visit: Payer: Self-pay | Admitting: *Deleted

## 2016-03-17 ENCOUNTER — Encounter: Payer: Self-pay | Admitting: *Deleted

## 2016-03-17 DIAGNOSIS — E785 Hyperlipidemia, unspecified: Secondary | ICD-10-CM

## 2016-03-23 ENCOUNTER — Other Ambulatory Visit: Payer: Self-pay | Admitting: *Deleted

## 2016-03-23 MED ORDER — ROSUVASTATIN CALCIUM 5 MG PO TABS: 5.0000 mg | ORAL_TABLET | Freq: Every day | ORAL | Status: DC

## 2016-03-24 ENCOUNTER — Other Ambulatory Visit: Payer: Self-pay

## 2016-03-24 ENCOUNTER — Encounter: Payer: Self-pay | Admitting: *Deleted

## 2016-03-24 ENCOUNTER — Ambulatory Visit (INDEPENDENT_AMBULATORY_CARE_PROVIDER_SITE_OTHER): Payer: Managed Care, Other (non HMO)

## 2016-03-24 ENCOUNTER — Telehealth: Payer: Self-pay | Admitting: Cardiovascular Disease

## 2016-03-24 DIAGNOSIS — R0789 Other chest pain: Secondary | ICD-10-CM | POA: Diagnosis not present

## 2016-03-24 NOTE — Telephone Encounter (Signed)
Pt aware and will come by office to pick up letter

## 2016-03-24 NOTE — Telephone Encounter (Signed)
That would be fine 

## 2016-03-24 NOTE — Telephone Encounter (Signed)
Mrs. [name] is requesting a note for her work to allow her to return to 42 hours vs 40 hours. Patient needs to know asap.  Please call [phone]

## 2016-03-25 ENCOUNTER — Telehealth: Payer: Self-pay | Admitting: *Deleted

## 2016-03-25 NOTE — Telephone Encounter (Signed)
Notes Recorded by Lesle ChrisAngela G Jermal Dismuke, LPN on 4/0/98116/10/2015 at 1:25 PM Patient notified and verbalized understanding. Copy to pmd. Follow up already scheduled 04/02/2016 with Dr. Purvis SheffieldKoneswaran in [place] office.  Notes Recorded by Laqueta LindenSuresh A Koneswaran, MD on 03/24/2016 at 1:44 PM Normal cardiac function.

## 2016-03-31 ENCOUNTER — Encounter: Payer: Self-pay | Admitting: *Deleted

## 2016-04-02 ENCOUNTER — Ambulatory Visit: Payer: Managed Care, Other (non HMO) | Admitting: Cardiovascular Disease

## 2016-06-08 ENCOUNTER — Ambulatory Visit: Payer: Managed Care, Other (non HMO) | Admitting: Cardiovascular Disease

## 2016-07-05 ENCOUNTER — Ambulatory Visit: Payer: Managed Care, Other (non HMO) | Admitting: Cardiovascular Disease

## 2016-07-13 ENCOUNTER — Telehealth: Payer: Self-pay | Admitting: Cardiovascular Disease

## 2016-07-13 NOTE — Telephone Encounter (Signed)
Patient came in stated the her work needed number 5 filled out with diagnosis. Also, needs number seven filled out with frequency? Will fax [employer] paper work [place] and [employer] in reference to having this completed for pateint.  When completed please attache office note per question number 4 and add patients name with [phone] on notes.

## 2016-07-26 ENCOUNTER — Encounter: Payer: Self-pay | Admitting: *Deleted

## 2016-07-27 ENCOUNTER — Encounter: Payer: Self-pay | Admitting: Cardiovascular Disease

## 2016-07-27 ENCOUNTER — Ambulatory Visit (INDEPENDENT_AMBULATORY_CARE_PROVIDER_SITE_OTHER): Payer: Managed Care, Other (non HMO) | Admitting: Cardiovascular Disease

## 2016-07-27 VITALS — BP 122/68 | HR 59 | Ht 62.0 in | Wt 147.0 lb

## 2016-07-27 DIAGNOSIS — E7801 Familial hypercholesterolemia: Secondary | ICD-10-CM

## 2016-07-27 DIAGNOSIS — R002 Palpitations: Secondary | ICD-10-CM

## 2016-07-27 DIAGNOSIS — R001 Bradycardia, unspecified: Secondary | ICD-10-CM

## 2016-07-27 DIAGNOSIS — E78019 Familial hypercholesterolemia, unspecified: Secondary | ICD-10-CM

## 2016-07-27 DIAGNOSIS — I10 Essential (primary) hypertension: Secondary | ICD-10-CM

## 2016-07-27 DIAGNOSIS — I6521 Occlusion and stenosis of right carotid artery: Secondary | ICD-10-CM

## 2016-07-27 DIAGNOSIS — I25708 Atherosclerosis of coronary artery bypass graft(s), unspecified, with other forms of angina pectoris: Secondary | ICD-10-CM | POA: Diagnosis not present

## 2016-07-27 DIAGNOSIS — I5032 Chronic diastolic (congestive) heart failure: Secondary | ICD-10-CM

## 2016-07-27 DIAGNOSIS — Z951 Presence of aortocoronary bypass graft: Secondary | ICD-10-CM | POA: Diagnosis not present

## 2016-07-27 MED ORDER — LISINOPRIL 20 MG PO TABS: 20.0000 mg | ORAL_TABLET | Freq: Every day | ORAL | Status: DC

## 2016-07-27 NOTE — Progress Notes (Signed)
SUBJECTIVE: The patient presents for follow-up of coronary artery disease. Echocardiogram 03/24/16 demonstrated normal left ventricular systolic and diastolic function and regional wall motion, LVEF 60-65%.  She ran out of Ranexa samples but her right-sided chest pain resolved. Her shortness of breath is improving and she is trying to quit smoking. She denies palpitations and leg swelling.    Review of Systems: As per "subjective", otherwise negative.  No Known Allergies  Current Outpatient Prescriptions  Medication Sig Dispense Refill  . ALPRAZolam (XANAX) 0.5 MG tablet Take 0.5 mg by mouth 3 (three) times daily as needed. For anxiety    . ARIPiprazole (ABILIFY) 10 MG tablet Take 10 mg by mouth daily.      Marland Kitchen. aspirin EC 81 MG tablet Take 81 mg by mouth daily.    . cholecalciferol (VITAMIN D) 1000 units tablet Take 1,000 Units by mouth daily.    Marland Kitchen. FLUoxetine (PROZAC) 40 MG capsule Take 40 mg by mouth daily.      . furosemide (LASIX) 20 MG tablet Take 20 mg daily as needed for leg swelling 90 tablet 3  . lisinopril (PRINIVIL,ZESTRIL) 40 MG tablet Take 40 mg by mouth daily.      . nitroGLYCERIN (NITROSTAT) 0.4 MG SL tablet Place 0.4 mg under the tongue every 5 (five) minutes x 3 doses as needed for chest pain (if no relief after 3rd dose, proceed to the ED for an evaluation).    Marland Kitchen. omeprazole (PRILOSEC) 20 MG capsule Take 20 mg by mouth daily. For heartburn/GERD    . potassium chloride SA (K-DUR,KLOR-CON) 20 MEQ tablet Take 1 tablet by mouth daily.    Marland Kitchen. PROAIR HFA 108 (90 Base) MCG/ACT inhaler Inhale 2 puffs into the lungs every 6 (six) hours as needed.  3  . ranolazine (RANEXA) 500 MG 12 hr tablet Take 1 tablet (500 mg total) by mouth 2 (two) times daily. 60 tablet 6  . rOPINIRole (REQUIP) 1 MG tablet Take 1 mg by mouth at bedtime.      . rosuvastatin (CRESTOR) 5 MG tablet Take 1 tablet (5 mg total) by mouth daily. 90 tablet 3   No current facility-administered medications for this  visit.     Past Medical History:  Diagnosis Date  . ADD (attention deficit disorder with hyperactivity)   . Anxiety   . Bipolar 1 disorder (HCC)   . Carotid artery disease (HCC)    Right 50-69% stenosis by doppler 2010  . Coronary artery disease    s/p 3V CABG '09  . CVA (cerebral infarction) 12/2008   Reports a neurologist told her she had a blood clot in her brain  . Depression   . Fibromyalgia   . GERD (gastroesophageal reflux disease)   . Hyperlipidemia   . Hypertension   . Tobacco abuse     Past Surgical History:  Procedure Laterality Date  . CARDIAC CATHETERIZATION    . CORONARY ARTERY BYPASS GRAFT  2009   3V CABG LIMA --> LAD, SVG --> Ramus Intermediate,  SVG --> RCA  . TUBAL LIGATION  1990    Social History   Social History  . Marital status: Married    Spouse name: N/A  . Number of children: N/A  . Years of education: N/A   Occupational History  . Unemployed     Applicatin for disability pending, inadequate funds to afford medical care   Social History Main Topics  . Smoking status: Current Some Day Smoker    Packs/day:  0.25    Years: 35.00    Types: Cigarettes    Start date: 12/14/1981    Last attempt to quit: 01/04/2016  . Smokeless tobacco: Never Used  . Alcohol use 4.2 oz/week    7 Cans of beer per week  . Drug use: No  . Sexual activity: Yes   Other Topics Concern  . Not on file   Social History Narrative   Divorced with 2 children     Vitals:   07/27/16 0925  BP: 122/68  Pulse: (!) 59  SpO2: 98%  Weight: 147 lb (66.7 kg)  Height: 5\' 2"  (1.575 m)    PHYSICAL EXAM General: NAD HEENT: Normal. Neck: No JVD, no thyromegaly. Lungs: Clear to auscultation bilaterally with normal respiratory effort. CV: Nondisplaced PMI.  Regular rate and rhythm, normal S1/S2, no S3/S4, no murmur. No pretibial or periankle edema.  No carotid bruit.   Abdomen: Soft, nontender, no distention.  Neurologic: Alert and oriented.  Psych: Normal  affect. Skin: Normal. Musculoskeletal: No gross deformities.    ECG: Most recent ECG reviewed.      ASSESSMENT AND PLAN: 1. Chest pain and fatigue in context of CAD with CABG: Stable. Exercise Cardiolite stress test was low risk 11/24/15. Continue ASA and statin. LVEF is normal.   2. Essential HTN: Controlled. Feels better on lisinopril 20 mg (reduced by PCP). No changes.  3. Bilateral carotid artery disease: Most recent results reviewed at earlier visit. Will repeat in 2 years. Continue ASA and statin.  4. Hyperlipidemia: 06/16/16 total cholesterol 122, triglycerides 161, HDL 38, LDL 52. Continue Crestor 5 mg daily.  5. Chronic diastolic heart failure: Euvolemic on Lasix 20 mg daily. Wt stable.   6. Bradycardia: Persists being off of Coreg. Will monitor.  7. Palpitations: Stable.  Dispo: f/u 6 months   Prentice Docker, M.D., F.A.C.C.

## 2016-07-27 NOTE — Patient Instructions (Signed)
Medication Instructions:   Continue Lisinopril 20mg  daily.  Remain off of the Ranexa, removed from medication list today.  Continue all other medications.    Labwork: none  Testing/Procedures: none  Follow-Up: Your physician wants you to follow up in:  1 year.  You will receive a reminder letter in the mail one-two months in advance.  If you don't receive a letter, please call our office to schedule the follow up appointment   Any Other Special Instructions Will Be Listed Below (If Applicable).  If you need a refill on your cardiac medications before your next appointment, please call your pharmacy.

## 2017-11-21 ENCOUNTER — Other Ambulatory Visit: Payer: Self-pay

## 2017-11-21 ENCOUNTER — Encounter: Payer: Self-pay | Admitting: Cardiovascular Disease

## 2017-11-21 ENCOUNTER — Encounter: Payer: Self-pay | Admitting: *Deleted

## 2017-11-21 ENCOUNTER — Ambulatory Visit: Payer: Managed Care, Other (non HMO) | Admitting: Cardiovascular Disease

## 2017-11-21 VITALS — BP 106/65 | HR 69 | Ht 62.0 in | Wt 134.0 lb

## 2017-11-21 DIAGNOSIS — J209 Acute bronchitis, unspecified: Secondary | ICD-10-CM | POA: Diagnosis not present

## 2017-11-21 DIAGNOSIS — I10 Essential (primary) hypertension: Secondary | ICD-10-CM | POA: Diagnosis not present

## 2017-11-21 DIAGNOSIS — Z9289 Personal history of other medical treatment: Secondary | ICD-10-CM | POA: Diagnosis not present

## 2017-11-21 DIAGNOSIS — I6521 Occlusion and stenosis of right carotid artery: Secondary | ICD-10-CM | POA: Diagnosis not present

## 2017-11-21 DIAGNOSIS — I5032 Chronic diastolic (congestive) heart failure: Secondary | ICD-10-CM | POA: Diagnosis not present

## 2017-11-21 DIAGNOSIS — I25708 Atherosclerosis of coronary artery bypass graft(s), unspecified, with other forms of angina pectoris: Secondary | ICD-10-CM

## 2017-11-21 DIAGNOSIS — Z716 Tobacco abuse counseling: Secondary | ICD-10-CM

## 2017-11-21 DIAGNOSIS — R0789 Other chest pain: Secondary | ICD-10-CM | POA: Diagnosis not present

## 2017-11-21 DIAGNOSIS — E782 Mixed hyperlipidemia: Secondary | ICD-10-CM | POA: Diagnosis not present

## 2017-11-21 MED ORDER — NITROGLYCERIN 0.4 MG SL SUBL: 0.4000 mg | SUBLINGUAL_TABLET | SUBLINGUAL | 3 refills | Status: DC | PRN

## 2017-11-21 MED ORDER — VARENICLINE TARTRATE 0.5 MG X 11 & 1 MG X 42 PO MISC: ORAL | 0 refills | Status: DC

## 2017-11-21 NOTE — Patient Instructions (Signed)
Medication Instructions:   Begin Chantix - as directed.  Continue all other medications.    Labwork: none  Testing/Procedures:  Your physician has requested that you have an echocardiogram. Echocardiography is a painless test that uses sound waves to create images of your heart. It provides your doctor with information about the size and shape of your heart and how well your heart's chambers and valves are working. This procedure takes approximately one hour. There are no restrictions for this procedure.  Office will contact with results via phone or letter.    Follow-Up: 6 weeks   Any Other Special Instructions Will Be Listed Below (If Applicable).  If you need a refill on your cardiac medications before your next appointment, please call your pharmacy.

## 2017-11-21 NOTE — Addendum Note (Signed)
Addended by: Lesle ChrisHILL, Jeri Rawlins G on: 11/21/2017 03:12 PM   Modules accepted: Orders

## 2017-11-21 NOTE — Progress Notes (Signed)
    SUBJECTIVE: The patient presents for overdue follow-up.  I last evaluated her in October 2017.  She has a history of coronary artery disease and CABG.  She underwent a low risk nuclear stress test on 11/24/15. Echocardiogram 03/24/16 demonstrated normal left ventricular systolic and diastolic function and regional wall motion, LVEF 60-65%.  She was evaluated in the ED yesterday at [facility].  She tells me an ECG was not done and they did not put her on telemetry.  I reviewed relevant labs and studies.  Chest x-ray showed COPD without acute abnormalities.  Pertinent labs: BUN 9, creatinine 0.79, normal troponin, normal N-terminal proBNP of 78, normal CBC.  She was prescribed azithromycin, benzonatate, and prednisone.  She has some mild chest pressure which is not related to exertion.  She is short of breath if she lies on her back and has to either lie on her chest or on her side.  She uses one pillow.  She has had swelling of the hands, feet, and thighs but denies calf and feet swelling.  She normally takes 20 mg of Lasix daily but has taken 40 mg for a few days prior to going to the ED and had minimal relief.  She requests Chantix as she wants to quit smoking.     Review of Systems: As per "subjective", otherwise negative.  No Known Allergies  Current Outpatient Medications  Medication Sig Dispense Refill  . ALPRAZolam (XANAX) 0.5 MG tablet Take 0.5 mg by mouth 3 (three) times daily as needed. For anxiety    . aspirin EC 81 MG tablet Take 81 mg by mouth daily.    . FLUoxetine (PROZAC) 40 MG capsule Take 40 mg by mouth daily.      . furosemide (LASIX) 20 MG tablet Take 20 mg daily as needed for leg swelling 90 tablet 3  . lisinopril (PRINIVIL,ZESTRIL) 20 MG tablet Take 1 tablet (20 mg total) by mouth daily.    . nitroGLYCERIN (NITROSTAT) 0.4 MG SL tablet Place 0.4 mg under the tongue every 5 (five) minutes x 3 doses as needed for chest pain (if no relief after 3rd dose,  proceed to the ED for an evaluation).    . omeprazole (PRILOSEC) 20 MG capsule Take 20 mg by mouth daily. For heartburn/GERD    . potassium chloride SA (K-DUR,KLOR-CON) 20 MEQ tablet Take 1 tablet by mouth daily.    . PROAIR HFA 108 (90 Base) MCG/ACT inhaler Inhale 2 puffs into the lungs every 6 (six) hours as needed.  3  . rOPINIRole (REQUIP) 1 MG tablet Take 1 mg by mouth at bedtime.      . rosuvastatin (CRESTOR) 5 MG tablet Take 1 tablet (5 mg total) by mouth daily. 90 tablet 3   No current facility-administered medications for this visit.     Past Medical History:  Diagnosis Date  . ADD (attention deficit disorder with hyperactivity)   . Anxiety   . Bipolar 1 disorder (HCC)   . Carotid artery disease (HCC)    Right 50-69% stenosis by doppler 2010  . Coronary artery disease    s/p 3V CABG '09  . CVA (cerebral infarction) 12/2008   Reports a neurologist told her she had a blood clot in her brain  . Depression   . Fibromyalgia   . GERD (gastroesophageal reflux disease)   . Hyperlipidemia   . Hypertension   . Tobacco abuse     Past Surgical History:  Procedure Laterality Date  .   CARDIAC CATHETERIZATION    . CORONARY ARTERY BYPASS GRAFT  2009   3V CABG LIMA --> LAD, SVG --> Ramus Intermediate,  SVG --> RCA  . TUBAL LIGATION  1990    Social History   Socioeconomic History  . Marital status: Married    Spouse name: Not on file  . Number of children: Not on file  . Years of education: Not on file  . Highest education level: Not on file  Social Needs  . Financial resource strain: Not on file  . Food insecurity - worry: Not on file  . Food insecurity - inability: Not on file  . Transportation needs - medical: Not on file  . Transportation needs - non-medical: Not on file  Occupational History  . Occupation: Unemployed    Comment: Applicatin for disability pending, inadequate funds to afford medical care  Tobacco Use  . Smoking status: Current Every Day Smoker     Packs/day: 0.50    Years: 35.00    Pack years: 17.50    Types: Cigarettes    Start date: 12/14/1981  . Smokeless tobacco: Never Used  Substance and Sexual Activity  . Alcohol use: Yes    Alcohol/week: 4.2 oz    Types: 7 Cans of beer per week  . Drug use: No  . Sexual activity: Yes  Other Topics Concern  . Not on file  Social History Narrative   Divorced with 2 children     Vitals:   11/21/17 1422  BP: 106/65  Pulse: 69  SpO2: 99%  Weight: 134 lb (60.8 kg)  Height: 5' 2" (1.575 m)    Wt Readings from Last 3 Encounters:  11/21/17 134 lb (60.8 kg)  07/27/16 147 lb (66.7 kg)  03/16/16 143 lb (64.9 kg)     PHYSICAL EXAM General: NAD HEENT: Normal. Neck: No JVD, no thyromegaly. Lungs: Clear to auscultation bilaterally with normal respiratory effort. CV: Regular rate and rhythm, normal S1/S2, no S3/S4, no murmur. No pretibial or periankle edema.  No carotid bruit.   Abdomen: Soft, nontender, no distention.  Neurologic: Alert and oriented.  Psych: Normal affect. Skin: Normal. Musculoskeletal: No gross deformities.    ECG: Most recent ECG reviewed.   Labs: Lab Results  Component Value Date/Time   K 3.6 03/01/2012 04:50 AM   BUN 12 03/01/2012 04:50 AM   CREATININE 0.70 03/01/2012 04:50 AM   ALT 11 03/01/2012 04:50 AM   TSH 0.555 03/01/2012 04:50 AM   HGB 12.9 03/01/2012 04:50 AM     Lipids: Lab Results  Component Value Date/Time   LDLCALC 98 09/29/2009 08:37 PM   CHOL 178 09/29/2009 08:37 PM   TRIG 185 (H) 09/29/2009 08:37 PM   HDL 43 09/29/2009 08:37 PM       ASSESSMENT AND PLAN:  1. CAD with CABG and chest pressure with leg swelling:  I will obtain an ECG today.  I will also obtain an echocardiogram to evaluate for interval changes in cardiac structure and function.  I will refill nitroglycerin as per her request.  Exercise Cardiolite stress test was low risk 11/24/15. Continue ASA and statin. LVEF is normal.   2. Essential HTN: Controlled.   No  changes to therapy.  3. Bilateral carotid artery disease: I will repeat Dopplers in one year. Continue ASA and statin.  4. Hyperlipidemia: Continue Crestor 5 mg daily.  5. Chronic diastolic heart failure: Currently taking Lasix 20 mg but has taken 40 mg for a few days.  I will obtain   an echocardiogram to assess for interval changes in cardiac structure and function.  6.  Acute bronchitis: There was no evidence of CHF by x-ray and BNP was normal.  There is no pretibial edema on exam.  I encouraged her to take prednisone and azithromycin as prescribed by her ED physician.  7.  Tobacco abuse: I will prescribe a Chantix starter kit as per her request.     Disposition: Follow up 6 weeks.  Time spent: 40 minutes, of which greater than 50% was spent reviewing symptoms, relevant blood tests and studies, and discussing management plan with the patient.     , M.D., F.A.C.C. 

## 2017-11-21 NOTE — Addendum Note (Signed)
Addended by: Lesle ChrisHILL, ANGELA G on: 11/21/2017 03:01 PM   Modules accepted: Orders

## 2017-11-23 ENCOUNTER — Other Ambulatory Visit: Payer: Self-pay

## 2017-11-23 ENCOUNTER — Telehealth: Payer: Self-pay | Admitting: Cardiovascular Disease

## 2017-11-23 ENCOUNTER — Ambulatory Visit (INDEPENDENT_AMBULATORY_CARE_PROVIDER_SITE_OTHER): Payer: Managed Care, Other (non HMO)

## 2017-11-23 DIAGNOSIS — R0789 Other chest pain: Secondary | ICD-10-CM | POA: Diagnosis not present

## 2017-11-23 NOTE — Telephone Encounter (Signed)
Pre-cert Verification for the following procedure   Echo scheduled for today  11/23/2017

## 2017-11-28 ENCOUNTER — Telehealth: Payer: Self-pay | Admitting: Cardiovascular Disease

## 2017-11-28 ENCOUNTER — Encounter: Payer: Self-pay | Admitting: *Deleted

## 2017-11-28 NOTE — Telephone Encounter (Signed)
Patient walked asking if she can have her dates for being out of work extended due to CIT GroupCHANTIX side affects.  Making her heart feel rushed and she is out of breath

## 2017-11-28 NOTE — Telephone Encounter (Signed)
Patient returned call asking about test results

## 2017-11-28 NOTE — Telephone Encounter (Signed)
Notes recorded by Lesle ChrisHill, Akiera Allbaugh G, LPN on 1/6/10962/01/2018 at 12:13 PM EST Patient notified. Copy to pmd. Follow up scheduled for March with Dr. Purvis SheffieldKoneswaran.   ------  Notes recorded by Laqueta LindenKoneswaran, Suresh A, MD on 11/24/2017 at 9:54 AM EST Normal.

## 2017-11-28 NOTE — Telephone Encounter (Signed)
Pt requesting extension to be out of work another week - pt will pick up letter tomorrow

## 2017-11-28 NOTE — Telephone Encounter (Signed)
That is fine 

## 2017-11-29 ENCOUNTER — Encounter: Payer: Self-pay | Admitting: *Deleted

## 2018-01-05 ENCOUNTER — Ambulatory Visit: Payer: Managed Care, Other (non HMO) | Admitting: Cardiovascular Disease

## 2018-01-26 DIAGNOSIS — K219 Gastro-esophageal reflux disease without esophagitis: Secondary | ICD-10-CM | POA: Insufficient documentation

## 2018-04-24 DIAGNOSIS — Z1211 Encounter for screening for malignant neoplasm of colon: Secondary | ICD-10-CM | POA: Insufficient documentation

## 2018-05-17 ENCOUNTER — Other Ambulatory Visit (HOSPITAL_COMMUNITY): Payer: Self-pay | Admitting: Respiratory Therapy

## 2018-05-17 DIAGNOSIS — R0602 Shortness of breath: Secondary | ICD-10-CM

## 2018-05-23 ENCOUNTER — Other Ambulatory Visit (HOSPITAL_BASED_OUTPATIENT_CLINIC_OR_DEPARTMENT_OTHER): Payer: Self-pay

## 2018-05-23 DIAGNOSIS — R0683 Snoring: Secondary | ICD-10-CM

## 2018-06-08 ENCOUNTER — Ambulatory Visit: Payer: Managed Care, Other (non HMO) | Attending: Pulmonary Disease | Admitting: Neurology

## 2018-06-08 DIAGNOSIS — G4733 Obstructive sleep apnea (adult) (pediatric): Secondary | ICD-10-CM | POA: Diagnosis not present

## 2018-06-08 DIAGNOSIS — R0683 Snoring: Secondary | ICD-10-CM | POA: Diagnosis present

## 2018-06-13 ENCOUNTER — Encounter (HOSPITAL_COMMUNITY): Payer: Managed Care, Other (non HMO)

## 2018-06-14 NOTE — Procedures (Signed)
[facility] Huel Centola A. Gerilyn Pilgrimoonquah, MD     [email or web address]             HOME SLEEP TEST  LOCATION: [facility]  Patient Name: [name], [name] Study Date: 06/08/2018 Gender: Female D.O.B: 04/25/1966 Age (years): 52 Referring Provider: Kari BaarsEdward Hawkins Height (inches): 62 Interpreting Physician: Beryle BeamsKofi Ikran Patman MD, ABSM Weight (lbs): 138 RPSGT: Alfonso EllisHedrick, Debra BMI: 25 MRN: [id number] Neck Size: 14.00 CLINICAL INFORMATION Sleep Study Type: HST     Indication for sleep study: Snoring     Epworth Sleepiness Score: 13  SLEEP STUDY TECHNIQUE A multi-channel overnight portable sleep study was performed. The channels recorded were: nasal airflow, thoracic respiratory movement, and oxygen saturation with a pulse oximetry. Snoring was also monitored.  MEDICATIONS Patient self administered medications include: N/A.  Current Outpatient Medications:  .  ALPRAZolam (XANAX) 0.5 MG tablet, Take 0.5 mg by mouth 3 (three) times daily as needed. For anxiety, Disp: , Rfl:  .  aspirin EC 81 MG tablet, Take 81 mg by mouth daily., Disp: , Rfl:  .  azithromycin (ZITHROMAX) 250 MG tablet, Take by mouth daily., Disp: , Rfl:  .  benzonatate (TESSALON) 100 MG capsule, Take by mouth 3 (three) times daily as needed for cough., Disp: , Rfl:  .  FLUoxetine (PROZAC) 40 MG capsule, Take 40 mg by mouth daily.  , Disp: , Rfl:  .  furosemide (LASIX) 20 MG tablet, Take 20 mg daily as needed for leg swelling, Disp: 90 tablet, Rfl: 3 .  lisinopril (PRINIVIL,ZESTRIL) 20 MG tablet, Take 1 tablet (20 mg total) by mouth daily., Disp: , Rfl:  .  methylPREDNISolone (MEDROL) 4 MG tablet, Take 4 mg by mouth daily., Disp: , Rfl:  .  nitroGLYCERIN (NITROSTAT) 0.4 MG SL tablet, Place 1 tablet (0.4 mg total) under the tongue every 5 (five) minutes x 3 doses as needed for chest pain (if no relief after 3rd dose, proceed to the ED for an evaluation)., Disp: 25 tablet, Rfl: 3 .  omeprazole (PRILOSEC) 20 MG capsule,  Take 20 mg by mouth daily. For heartburn/GERD, Disp: , Rfl:  .  potassium chloride SA (K-DUR,KLOR-CON) 20 MEQ tablet, Take 1 tablet by mouth daily., Disp: , Rfl:  .  PROAIR HFA 108 (90 Base) MCG/ACT inhaler, Inhale 2 puffs into the lungs every 6 (six) hours as needed., Disp: , Rfl: 3 .  rOPINIRole (REQUIP) 1 MG tablet, Take 1 mg by mouth at bedtime.  , Disp: , Rfl:  .  rosuvastatin (CRESTOR) 5 MG tablet, Take 1 tablet (5 mg total) by mouth daily., Disp: 90 tablet, Rfl: 3 .  varenicline (CHANTIX STARTING MONTH PAK) 0.5 MG X 11 & 1 MG X 42 tablet, Take one 0.5 mg tablet by mouth once daily for 3 days, then increase to one 0.5 mg tablet twice daily for 4 days, then increase to one 1 mg tablet twice daily., Disp: 53 tablet, Rfl: 0   SLEEP ARCHITECTURE Patient was studied for 532.4 minutes. The sleep efficiency was 88.7 % and the patient was supine for 51.5%. The arousal index was 0.0 per hour.  RESPIRATORY PARAMETERS The overall AHI was 12.6 per hour, with a central apnea index of 0.7 per hour.  The oxygen nadir was 82% during sleep.     CARDIAC DATA Mean heart rate during sleep was 62.1 bpm.  IMPRESSIONS Mild obstructive sleep apnea occurred during this study (AHI = 12.6/h).  A formal CPAP titration recording is recommended.   Argie RammingKofi A Antonella Upson, MD Diplomate, American  Board of Sleep Medicine.   ELECTRONICALLY SIGNED ON:  06/14/2018, 2:20 PM [facility] PH: [phone]   FX: [phone] ACCREDITED BY THE AMERICAN ACADEMY OF SLEEP MEDICINE

## 2018-06-15 ENCOUNTER — Telehealth: Payer: Self-pay | Admitting: Cardiovascular Disease

## 2018-06-15 ENCOUNTER — Encounter: Payer: Self-pay | Admitting: *Deleted

## 2018-06-15 NOTE — Telephone Encounter (Signed)
Patient called office to report that her BP bottomed out this morning at [facility] while getting ready for a colonoscopy. Patient's procedure was postponed and she was advised to contact her PCP and cardiologist about this. Patient was unable to give BP readings since she was sedated. Patient informed nurse that she is seeing her PCP on Monday, 06/19/18. Patient was given first available to see Dr. Kirtland BouchardK (08/25/18) and was advised that if her PCP felt she needed to be seen sooner, to have their office contact us about an appointment. Verbalized understanding. Records requested from [facility].
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
## 2018-06-15 NOTE — Telephone Encounter (Signed)
Patient was at [facility] having a colonoscopy this morning and they had to a-bort her procedure due to her heart rate dropping so low.

## 2018-06-17 NOTE — Telephone Encounter (Signed)
Overdue for follow up. When I saw her in January 2019 she was supposed to follow up 6 weeks later as per my note. She may need BP med adjustment (possible reduction in lisinopril dose). I will await PCP's evaluation.

## 2018-06-19 ENCOUNTER — Ambulatory Visit (INDEPENDENT_AMBULATORY_CARE_PROVIDER_SITE_OTHER): Payer: 59 | Admitting: Psychiatry

## 2018-06-19 ENCOUNTER — Encounter (HOSPITAL_COMMUNITY): Payer: Self-pay | Admitting: Psychiatry

## 2018-06-19 VITALS — BP 126/73 | HR 56 | Ht 62.0 in | Wt 137.0 lb

## 2018-06-19 DIAGNOSIS — F410 Panic disorder [episodic paroxysmal anxiety] without agoraphobia: Secondary | ICD-10-CM

## 2018-06-19 DIAGNOSIS — F411 Generalized anxiety disorder: Secondary | ICD-10-CM

## 2018-06-19 DIAGNOSIS — F0630 Mood disorder due to known physiological condition, unspecified: Secondary | ICD-10-CM | POA: Diagnosis not present

## 2018-06-19 DIAGNOSIS — J439 Emphysema, unspecified: Secondary | ICD-10-CM

## 2018-06-19 HISTORY — DX: Emphysema, unspecified: J43.9

## 2018-06-19 MED ORDER — FLUOXETINE HCL 20 MG PO TABS: ORAL_TABLET | ORAL | 0 refills | Status: DC

## 2018-06-19 NOTE — Patient Instructions (Signed)
1. Increase fluoxetine 60 mg daily  2. Continue Abilify 10 mg daily  3. Continue buspar 15 mg twice day 4. Return to clinic in one month for 30 mins 5. Referral to therapy

## 2018-06-20 ENCOUNTER — Ambulatory Visit (HOSPITAL_COMMUNITY)
Admission: RE | Admit: 2018-06-20 | Discharge: 2018-06-20 | Disposition: A | Discharge status: Home / Self Care | Payer: Managed Care, Other (non HMO) | Source: Ambulatory Visit | Attending: Pulmonary Disease | Admitting: Pulmonary Disease

## 2018-06-20 DIAGNOSIS — R0602 Shortness of breath: Secondary | ICD-10-CM

## 2018-06-20 LAB — PULMONARY FUNCTION TEST
DL/VA % pred: 78 %
DL/VA: 3.55 ml/min/mmHg/L
DLCO UNC % PRED: 66 %
DLCO unc: 14.32 ml/min/mmHg
FEF 25-75 PRE: 0.85 L/s
FEF 25-75 Post: 1.11 L/sec
FEF2575-%CHANGE-POST: 30 %
FEF2575-%Pred-Post: 43 %
FEF2575-%Pred-Pre: 33 %
FEV1-%Change-Post: 8 %
FEV1-%PRED-POST: 68 %
FEV1-%Pred-Pre: 63 %
FEV1-POST: 1.75 L
FEV1-Pre: 1.62 L
FEV1FVC-%CHANGE-POST: 0 %
FEV1FVC-%Pred-Pre: 79 %
FEV6-%Change-Post: 8 %
FEV6-%PRED-PRE: 79 %
FEV6-%Pred-Post: 86 %
FEV6-PRE: 2.51 L
FEV6-Post: 2.74 L
FEV6FVC-%Change-Post: 0 %
FEV6FVC-%PRED-PRE: 101 %
FEV6FVC-%Pred-Post: 101 %
FVC-%CHANGE-POST: 8 %
FVC-%PRED-POST: 85 %
FVC-%PRED-PRE: 78 %
FVC-POST: 2.77 L
FVC-PRE: 2.55 L
POST FEV1/FVC RATIO: 63 %
POST FEV6/FVC RATIO: 99 %
Pre FEV1/FVC ratio: 64 %
Pre FEV6/FVC Ratio: 99 %
RV % PRED: 191 %
RV: 3.32 L
TLC % pred: 123 %
TLC: 5.88 L

## 2018-06-20 MED ORDER — ALBUTEROL SULFATE (2.5 MG/3ML) 0.083% IN NEBU
2.5000 mg | INHALATION_SOLUTION | Freq: Once | RESPIRATORY_TRACT | Status: AC
  Administered 2018-06-20: 2.5 mg via RESPIRATORY_TRACT

## 2018-06-22 ENCOUNTER — Telehealth (HOSPITAL_COMMUNITY): Payer: Self-pay | Admitting: Psychiatry

## 2018-06-22 ENCOUNTER — Other Ambulatory Visit (HOSPITAL_COMMUNITY): Payer: Self-pay | Admitting: *Deleted

## 2018-06-22 NOTE — Telephone Encounter (Signed)
Received request for long term disability form. Please contact the patient that I would not be able to fill this form at this time as I have seen her only once, and I would not be able to provide enough information to support applying for disability.

## 2018-06-23 NOTE — Telephone Encounter (Signed)
Done

## 2018-07-03 ENCOUNTER — Ambulatory Visit: Payer: Managed Care, Other (non HMO) | Admitting: Cardiovascular Disease

## 2018-07-03 ENCOUNTER — Encounter: Payer: Self-pay | Admitting: Cardiovascular Disease

## 2018-07-03 VITALS — BP 142/86 | HR 86 | Ht 62.0 in | Wt 140.0 lb

## 2018-07-03 DIAGNOSIS — I739 Peripheral vascular disease, unspecified: Secondary | ICD-10-CM

## 2018-07-03 DIAGNOSIS — I5032 Chronic diastolic (congestive) heart failure: Secondary | ICD-10-CM

## 2018-07-03 DIAGNOSIS — I25118 Atherosclerotic heart disease of native coronary artery with other forms of angina pectoris: Secondary | ICD-10-CM

## 2018-07-03 DIAGNOSIS — I779 Disorder of arteries and arterioles, unspecified: Secondary | ICD-10-CM

## 2018-07-03 DIAGNOSIS — E785 Hyperlipidemia, unspecified: Secondary | ICD-10-CM | POA: Diagnosis not present

## 2018-07-03 DIAGNOSIS — I10 Essential (primary) hypertension: Secondary | ICD-10-CM

## 2018-07-03 MED ORDER — ISOSORBIDE MONONITRATE ER 30 MG PO TB24: 30.0000 mg | ORAL_TABLET | Freq: Every day | ORAL | 6 refills | Status: DC

## 2018-07-03 NOTE — Progress Notes (Signed)
SUBJECTIVE: The patient presents for past due follow-up.  She has a history of coronary artery disease and CABG.  She underwent a low risk nuclear stress test on 11/24/2015. Echocardiogram on 11/23/2017 was normal, LVEF 60 to 65%. Past medical history also includes panic disorder.  She contacted our office on 06/15/2018 stating that her blood pressure became very low prior to colonoscopy.  She was unable to provide blood pressure values.  She tells me that she has been experiencing chest pain about 3-4 times per month.  It has awoken her from sleep.  She has taken 1-2 nitroglycerin tablets and on one occasion took 3.  It occasionally radiates to her back.  She has had some lightheadedness but denies syncope.  She has had some palpitations.  She denies nausea and vomiting.  She denies any significant leg swelling.  She is applying for disability.    Review of Systems: As per "subjective", otherwise negative.  No Known Allergies  Current Outpatient Medications  Medication Sig Dispense Refill  . ALPRAZolam (XANAX) 0.5 MG tablet Take 0.5 mg by mouth 3 (three) times daily as needed. For anxiety  ** [employer] SCRIPTS  90 DAY SUPPLY**    . amLODipine (NORVASC) 5 MG tablet Take 5 mg by mouth daily.    Marland Kitchen aspirin EC 81 MG tablet Take 81 mg by mouth daily.    Marland Kitchen azithromycin (ZITHROMAX) 250 MG tablet Take by mouth daily.    . benzonatate (TESSALON) 100 MG capsule Take by mouth 3 (three) times daily as needed for cough.    Marland Kitchen FLUoxetine (PROZAC) 20 MG tablet Take total of 60 mg (40 mg + 20 mg) daily 30 tablet 0  . FLUoxetine (PROZAC) 40 MG capsule Take 40 mg by mouth daily. ** [employer] SCRIPTS  90 DAY SUPPLY**    . furosemide (LASIX) 20 MG tablet Take 20 mg daily as needed for leg swelling 90 tablet 3  . nitroGLYCERIN (NITROSTAT) 0.4 MG SL tablet Place 1 tablet (0.4 mg total) under the tongue every 5 (five) minutes x 3 doses as needed for chest pain (if no relief after 3rd dose, proceed to the ED  for an evaluation). 25 tablet 3  . omeprazole (PRILOSEC) 20 MG capsule Take 20 mg by mouth daily. For heartburn/GERD    . potassium chloride SA (K-DUR,KLOR-CON) 20 MEQ tablet Take 1 tablet by mouth daily.    Marland Kitchen PROAIR HFA 108 (90 Base) MCG/ACT inhaler Inhale 2 puffs into the lungs every 6 (six) hours as needed.  3  . rOPINIRole (REQUIP) 1 MG tablet Take 1 mg by mouth at bedtime.      . rosuvastatin (CRESTOR) 5 MG tablet Take 1 tablet (5 mg total) by mouth daily. 90 tablet 3  . varenicline (CHANTIX STARTING MONTH PAK) 0.5 MG X 11 & 1 MG X 42 tablet Take one 0.5 mg tablet by mouth once daily for 3 days, then increase to one 0.5 mg tablet twice daily for 4 days, then increase to one 1 mg tablet twice daily. 53 tablet 0  . zolpidem (AMBIEN) 10 MG tablet Take 10 mg by mouth at bedtime as needed.     No current facility-administered medications for this visit.     Past Medical History:  Diagnosis Date  . ADD (attention deficit disorder with hyperactivity)   . Anxiety   . Bipolar 1 disorder (HCC)   . Carotid artery disease (HCC)    Right 50-69% stenosis by doppler 2010  .  Coronary artery disease    s/p 3V CABG '09  . CVA (cerebral infarction) 12/2008   Reports a neurologist told her she had a blood clot in her brain  . Depression   . Emphysema of lung (HCC) 06/19/2018  . Fibromyalgia   . GERD (gastroesophageal reflux disease)   . Hyperlipidemia   . Hypertension   . Tobacco abuse     Past Surgical History:  Procedure Laterality Date  . CARDIAC CATHETERIZATION    . CORONARY ARTERY BYPASS GRAFT  2009   3V CABG LIMA --> LAD, SVG --> Ramus Intermediate,  SVG --> RCA  . TUBAL LIGATION  1990    Social History   Socioeconomic History  . Marital status: Married    Spouse name: Not on file  . Number of children: Not on file  . Years of education: Not on file  . Highest education level: Not on file  Occupational History  . Occupation: Unemployed    Comment: Applicatin for disability  pending, inadequate funds to afford medical care  Social Needs  . Financial resource strain: Not on file  . Food insecurity:    Worry: Not on file    Inability: Not on file  . Transportation needs:    Medical: Not on file    Non-medical: Not on file  Tobacco Use  . Smoking status: Current Every Day Smoker    Packs/day: 0.50    Years: 35.00    Pack years: 17.50    Types: Cigarettes    Start date: 12/14/1981  . Smokeless tobacco: Never Used  Substance and Sexual Activity  . Alcohol use: Yes    Alcohol/week: 7.0 standard drinks    Types: 7 Cans of beer per week  . Drug use: No  . Sexual activity: Yes  Lifestyle  . Physical activity:    Days per week: Not on file    Minutes per session: Not on file  . Stress: Not on file  Relationships  . Social connections:    Talks on phone: Not on file    Gets together: Not on file    Attends religious service: Not on file    Active member of club or organization: Not on file    Attends meetings of clubs or organizations: Not on file    Relationship status: Not on file  . Intimate partner violence:    Fear of current or ex partner: Not on file    Emotionally abused: Not on file    Physically abused: Not on file    Forced sexual activity: Not on file  Other Topics Concern  . Not on file  Social History Narrative   Divorced with 2 children     There were no vitals filed for this visit.  Wt Readings from Last 3 Encounters:  11/21/17 134 lb (60.8 kg)  07/27/16 147 lb (66.7 kg)  03/16/16 143 lb (64.9 kg)     PHYSICAL EXAM General: NAD HEENT: Normal. Neck: No JVD, no thyromegaly. Lungs: Clear to auscultation bilaterally with normal respiratory effort. CV: Regular rate and rhythm, normal S1/S2, no S3/S4, no murmur. No pretibial or periankle edema.  No carotid bruit.   Abdomen: Soft, nontender, no distention.  Neurologic: Alert and oriented.  Psych: Normal affect. Skin: Normal. Musculoskeletal: No gross  deformities.    ECG: Reviewed above under Subjective   Labs: Lab Results  Component Value Date/Time   K 3.6 03/01/2012 04:50 AM   BUN 12 03/01/2012 04:50 AM   CREATININE  0.70 03/01/2012 04:50 AM   ALT 11 03/01/2012 04:50 AM   TSH 0.555 03/01/2012 04:50 AM   HGB 12.9 03/01/2012 04:50 AM     Lipids: Lab Results  Component Value Date/Time   LDLCALC 98 09/29/2009 08:37 PM   CHOL 178 09/29/2009 08:37 PM   TRIG 185 (H) 09/29/2009 08:37 PM   HDL 43 09/29/2009 08:37 PM       ASSESSMENT AND PLAN: 1. CAD with CABG and chest pain:Echocardiogram in January 2019 demonstrated normal left ventricular systolic and diastolic function.  Exercise Cardiolite stress test was low risk 11/24/15. Continue ASA and statin.  I will add Imdur 30 mg daily.  2. Essential HTN: Controlled today.  I will monitor blood pressure given the addition of Imdur.  3. Bilateral carotid artery disease: I will repeat Dopplers within the next several months. Continue ASA and statin.  4. Hyperlipidemia:ContinueCrestor 5 mg daily.  5. Chronic diastolic heart failure: Currently taking Lasix 20 mg.   Disposition: Follow up November 2019   Prentice Docker, M.D., F.A.C.C.

## 2018-07-03 NOTE — Patient Instructions (Signed)
Medication Instructions:   Begin Imdur 30mg  daily.   Continue all other medications.    Labwork: none  Testing/Procedures: none  Follow-Up: November - [place] office   Any Other Special Instructions Will Be Listed Below (If Applicable). Please call the office in 1 month with update on symptoms.    If you need a refill on your cardiac medications before your next appointment, please call your pharmacy.

## 2018-07-05 ENCOUNTER — Telehealth: Payer: Self-pay | Admitting: *Deleted

## 2018-07-05 NOTE — Telephone Encounter (Signed)
Patient called to report having a headache since starting imdur 30 mg. Patient said she has been taking ibuprofen for the headache. Patient also c/o having a throbbing pain in both her ankles since waking up this morning. Patient advised that a headache is a side effect of imdur and she can try breaking it in half to equal 15 mg and take it at bedtime. Also advised to take tylenol for the headache. Patient advised that the headache should improve after 1-2 weeks of taking the imdur. Patient advised to contact her PCP r/e her ankle pain. Verbalized understanding of plan.

## 2018-07-07 MED ORDER — RANOLAZINE ER 500 MG PO TB12: 500.0000 mg | ORAL_TABLET | Freq: Two times a day (BID) | ORAL | 3 refills | Status: DC

## 2018-07-07 NOTE — Telephone Encounter (Signed)
Stop Imdur and try ranolazine 500 mg twice daily.

## 2018-07-07 NOTE — Addendum Note (Signed)
Addended by: Eustace MooreANDERSON, LYDIA M on: 07/07/2018 09:55 AM   Modules accepted: Orders

## 2018-07-07 NOTE — Telephone Encounter (Signed)
Patient called back to report the 15 mg imdur is still causing bad headaches rated 10/10 and also reported that the pain was also in her shoulders and legs now. Patient advised that message would be sent to her provider for further recommendations. Verbalized understanding.

## 2018-07-07 NOTE — Telephone Encounter (Signed)
Patient informed and verbalized understanding of plan. 
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
## 2018-07-21 ENCOUNTER — Ambulatory Visit (INDEPENDENT_AMBULATORY_CARE_PROVIDER_SITE_OTHER): Payer: 59 | Admitting: Psychiatry

## 2018-07-21 ENCOUNTER — Encounter (HOSPITAL_COMMUNITY): Payer: Self-pay | Admitting: Psychiatry

## 2018-07-21 VITALS — BP 137/72 | HR 61 | Ht 62.0 in | Wt 140.0 lb

## 2018-07-21 DIAGNOSIS — F410 Panic disorder [episodic paroxysmal anxiety] without agoraphobia: Secondary | ICD-10-CM | POA: Diagnosis not present

## 2018-07-21 DIAGNOSIS — F4310 Post-traumatic stress disorder, unspecified: Secondary | ICD-10-CM | POA: Diagnosis not present

## 2018-07-21 DIAGNOSIS — G4700 Insomnia, unspecified: Secondary | ICD-10-CM | POA: Diagnosis not present

## 2018-07-21 DIAGNOSIS — F411 Generalized anxiety disorder: Secondary | ICD-10-CM

## 2018-07-21 MED ORDER — FLUOXETINE HCL 40 MG PO CAPS: ORAL_CAPSULE | ORAL | 0 refills | Status: DC

## 2018-07-21 MED ORDER — PRAZOSIN HCL 1 MG PO CAPS: 1.0000 mg | ORAL_CAPSULE | Freq: Every day | ORAL | 0 refills | Status: DC

## 2018-07-21 MED ORDER — FLUOXETINE HCL 20 MG PO TABS: ORAL_TABLET | ORAL | 0 refills | Status: DC

## 2018-07-21 MED ORDER — PRAZOSIN HCL 2 MG PO CAPS: ORAL_CAPSULE | ORAL | 1 refills | Status: DC

## 2018-07-21 NOTE — Patient Instructions (Signed)
1. Continue fluoxetine 60 mg daily  2. Continue Abilify 10 mg daily  3. Start prazosin 1 mg at night for three night, then 2 mg at night 4. Continue Buspar 15 mg twice day 5. Return to clinic in two months for 30 mins 6. Referral to therapy

## 2018-07-25 ENCOUNTER — Telehealth (HOSPITAL_COMMUNITY): Payer: Self-pay | Admitting: Psychiatry

## 2018-07-25 NOTE — Telephone Encounter (Signed)
Received fax for lab result. TSH 1.14, 07/24/2018.

## 2018-08-25 ENCOUNTER — Encounter

## 2018-08-25 ENCOUNTER — Ambulatory Visit: Payer: Self-pay | Admitting: Cardiovascular Disease

## 2018-09-05 ENCOUNTER — Ambulatory Visit: Payer: Managed Care, Other (non HMO) | Admitting: Cardiovascular Disease

## 2018-09-05 ENCOUNTER — Encounter: Payer: Self-pay | Admitting: Cardiovascular Disease

## 2018-09-05 VITALS — BP 152/78 | HR 61 | Ht 62.0 in | Wt 142.0 lb

## 2018-09-05 DIAGNOSIS — I10 Essential (primary) hypertension: Secondary | ICD-10-CM | POA: Diagnosis not present

## 2018-09-05 DIAGNOSIS — I25118 Atherosclerotic heart disease of native coronary artery with other forms of angina pectoris: Secondary | ICD-10-CM | POA: Diagnosis not present

## 2018-09-05 DIAGNOSIS — E785 Hyperlipidemia, unspecified: Secondary | ICD-10-CM

## 2018-09-05 DIAGNOSIS — I739 Peripheral vascular disease, unspecified: Secondary | ICD-10-CM

## 2018-09-05 DIAGNOSIS — I5032 Chronic diastolic (congestive) heart failure: Secondary | ICD-10-CM

## 2018-09-05 DIAGNOSIS — R519 Headache, unspecified: Secondary | ICD-10-CM

## 2018-09-05 DIAGNOSIS — I779 Disorder of arteries and arterioles, unspecified: Secondary | ICD-10-CM

## 2018-09-05 DIAGNOSIS — R51 Headache: Secondary | ICD-10-CM

## 2018-09-05 MED ORDER — AMLODIPINE BESYLATE 10 MG PO TABS: 10.0000 mg | ORAL_TABLET | Freq: Every day | ORAL | 3 refills | Status: DC

## 2018-09-05 NOTE — Patient Instructions (Addendum)
Medication Instructions:  INCREASE Amlodipine 10 mg daily If you need a refill on your cardiac medications before your next appointment, please call your pharmacy.   Lab work: NONE If you have labs (blood work) drawn today and your tests are completely normal, you will receive your results only by: Marland Kitchen. MyChart Message (if you have MyChart) OR . A paper copy in the mail If you have any lab test that is abnormal or we need to change your treatment, we will call you to review the results.  Testing/Procedures: none  Follow-Up: At [facility], you and your health needs are our priority.  As part of our continuing mission to provide you with exceptional heart care, we have created designated Provider Care Teams.  These Care Teams include your primary Cardiologist (physician) and Advanced Practice Providers (APPs -  Physician Assistants and Nurse Practitioners) who all work together to provide you with the care you need, when you need it. You will need a follow up appointment in 3 months.  Please call our office 2 months in advance to schedule this appointment.  You may see Prentice DockerSuresh Koneswaran, MD or one of the following Advanced Practice Providers on your designated Care Team:   Randall AnBrittany Strader, PA-C [facility]([place] [facility]) . Jacolyn ReedyMichele Lenze, PA-C [facility]([place] [facility])  Any Other Special Instructions Will Be Listed Below (If Applicable). Keep a symptom diary

## 2018-09-05 NOTE — Progress Notes (Signed)
SUBJECTIVE: The patient presents for routine follow-up of coronary artery disease.  I previously tried Imdur but it led to headaches and pain in her shoulders and legs.  I then switched her to ranolazine.  She has a history of coronary artery disease and CABG.  She underwent a low risk nuclear stress test on 11/24/2015. Echocardiogram on 11/23/2017 was normal, LVEF 60 to 65%. Past medical history also includes panic disorder.  She has been having right sided inframammary chest pains which can occur both at rest and with exertion.  There is sometimes provoked by bending over to tie her shoes or lying down on her left side.  She has been experience and bad headaches for the past 2 weeks.  She has a history of CVA.  She has not been checking her blood pressure at home regularly.  She has also noticed that her hands and feet have been swelling.  She told me about her pet Gaynelle Cage that thinks the patient is its girlfriend.     Review of Systems: As per "subjective", otherwise negative.  No Known Allergies  Current Outpatient Medications  Medication Sig Dispense Refill  . ALPRAZolam (XANAX) 0.5 MG tablet Take 0.5 mg by mouth 3 (three) times daily as needed. For anxiety  ** [employer] SCRIPTS  90 DAY SUPPLY**    . amLODipine (NORVASC) 5 MG tablet Take 5 mg by mouth daily.    Marland Kitchen aspirin EC 81 MG tablet Take 81 mg by mouth daily.    . budesonide-formoterol (SYMBICORT) 160-4.5 MCG/ACT inhaler Inhale 2 puffs into the lungs 2 (two) times daily.    . busPIRone HCl (BUSPAR PO) Take by mouth.    . cyclobenzaprine (FLEXERIL) 10 MG tablet Take 10 mg by mouth at bedtime.    Marland Kitchen DIPHENOXYLATE-ATROPINE PO Take 2.5 mg by mouth 4 (four) times daily.    Marland Kitchen FLUoxetine (PROZAC) 20 MG tablet Take total of 60 mg (40 mg + 20 mg) daily 90 tablet 0  . FLUoxetine (PROZAC) 40 MG capsule Take total of 60 mg daily (40 mg +20 mg) 90 capsule 0  . furosemide (LASIX) 20 MG tablet Take 20 mg daily as needed for leg  swelling 90 tablet 3  . nitroGLYCERIN (NITROSTAT) 0.4 MG SL tablet Place 1 tablet (0.4 mg total) under the tongue every 5 (five) minutes x 3 doses as needed for chest pain (if no relief after 3rd dose, proceed to the ED for an evaluation). 25 tablet 3  . omeprazole (PRILOSEC) 40 MG capsule Take 40 mg by mouth daily.    . potassium chloride (KLOR-CON) 20 MEQ packet Take 20 mEq by mouth daily.    . prazosin (MINIPRESS) 1 MG capsule Take 1 capsule (1 mg total) by mouth at bedtime. 3 capsule 0  . prazosin (MINIPRESS) 2 MG capsule Start after taking 1 mg for three nights 30 capsule 1  . PROAIR HFA 108 (90 Base) MCG/ACT inhaler Inhale 2 puffs into the lungs every 6 (six) hours as needed.  3  . ranolazine (RANEXA) 500 MG 12 hr tablet Take 1 tablet (500 mg total) by mouth 2 (two) times daily. 60 tablet 3  . rOPINIRole (REQUIP) 1 MG tablet Take 1 mg by mouth at bedtime.      . rosuvastatin (CRESTOR) 5 MG tablet Take 1 tablet (5 mg total) by mouth daily. 90 tablet 3   No current facility-administered medications for this visit.     Past Medical History:  Diagnosis Date  .  ADD (attention deficit disorder with hyperactivity)   . Anxiety   . Bipolar 1 disorder (HCC)   . Carotid artery disease (HCC)    Right 50-69% stenosis by doppler 2010  . Coronary artery disease    s/p 3V CABG '09  . CVA (cerebral infarction) 12/2008   Reports a neurologist told her she had a blood clot in her brain  . Depression   . Emphysema of lung (HCC) 06/19/2018  . Fibromyalgia   . GERD (gastroesophageal reflux disease)   . Hyperlipidemia   . Hypertension   . Tobacco abuse     Past Surgical History:  Procedure Laterality Date  . CARDIAC CATHETERIZATION    . CORONARY ARTERY BYPASS GRAFT  2009   3V CABG LIMA --> LAD, SVG --> Ramus Intermediate,  SVG --> RCA  . TUBAL LIGATION  1990    Social History   Socioeconomic History  . Marital status: Married    Spouse name: Not on file  . Number of children: Not on file    . Years of education: Not on file  . Highest education level: Not on file  Occupational History  . Occupation: Unemployed    Comment: Applicatin for disability pending, inadequate funds to afford medical care  Social Needs  . Financial resource strain: Not on file  . Food insecurity:    Worry: Not on file    Inability: Not on file  . Transportation needs:    Medical: Not on file    Non-medical: Not on file  Tobacco Use  . Smoking status: Current Every Day Smoker    Packs/day: 0.50    Years: 35.00    Pack years: 17.50    Types: Cigarettes    Start date: 12/14/1981  . Smokeless tobacco: Never Used  Substance and Sexual Activity  . Alcohol use: Yes    Alcohol/week: 7.0 standard drinks    Types: 7 Cans of beer per week  . Drug use: No  . Sexual activity: Yes  Lifestyle  . Physical activity:    Days per week: Not on file    Minutes per session: Not on file  . Stress: Not on file  Relationships  . Social connections:    Talks on phone: Not on file    Gets together: Not on file    Attends religious service: Not on file    Active member of club or organization: Not on file    Attends meetings of clubs or organizations: Not on file    Relationship status: Not on file  . Intimate partner violence:    Fear of current or ex partner: Not on file    Emotionally abused: Not on file    Physically abused: Not on file    Forced sexual activity: Not on file  Other Topics Concern  . Not on file  Social History Narrative   Divorced with 2 children     Vitals:   09/05/18 1540  BP: (!) 152/78  Pulse: 61  SpO2: 98%  Weight: 142 lb (64.4 kg)  Height: 5\' 2"  (1.575 m)    Wt Readings from Last 3 Encounters:  09/05/18 142 lb (64.4 kg)  07/03/18 140 lb (63.5 kg)  11/21/17 134 lb (60.8 kg)     PHYSICAL EXAM General: NAD HEENT: Normal. Neck: No JVD, no thyromegaly. Lungs: Clear to auscultation bilaterally with normal respiratory effort. CV: Regular rate and rhythm, normal  S1/S2, no S3/S4, no murmur.  Positive right inframammary and right infra axillary  tenderness to palpation.  No palpable lymph nodes.  No pretibial or periankle edema.     Abdomen: Soft, nontender, no distention.  Neurologic: Alert and oriented.  Psych: Normal affect. Skin: Normal. Musculoskeletal: No gross deformities.    ECG: Reviewed above under Subjective   Labs: Lab Results  Component Value Date/Time   K 3.6 03/01/2012 04:50 AM   BUN 12 03/01/2012 04:50 AM   CREATININE 0.70 03/01/2012 04:50 AM   ALT 11 03/01/2012 04:50 AM   TSH 0.555 03/01/2012 04:50 AM   HGB 12.9 03/01/2012 04:50 AM     Lipids: Lab Results  Component Value Date/Time   LDLCALC 98 09/29/2009 08:37 PM   CHOL 178 09/29/2009 08:37 PM   TRIG 185 (H) 09/29/2009 08:37 PM   HDL 43 09/29/2009 08:37 PM       ASSESSMENT AND PLAN:  1. CAD with CABG: Symptomatically stable. Echocardiogram in January 2019 demonstrated normal left ventricular systolic and diastolic function. Exercise Cardiolite stress test was low risk 11/24/15. Continue ASA, ranolazine, and rosuvastatin.    She did not tolerate long-acting nitrates.  2. Essential HTN: Blood pressure is elevated.  She has been experiencing headaches which may be secondary to this.  I will increase amlodipine to 10 mg.  3. Bilateral carotid artery disease:I will repeat Dopplers within the next several months. Continue ASA and statin.  4. Hyperlipidemia:ContinueCrestor 5 mg daily.  I will obtain a copy of lipids from PCP.  5. Chronic diastolic heart failure:Currently taking Lasix 20 mg.   Disposition: Follow up 3 months   Prentice Docker, M.D., F.A.C.C.
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
## 2018-09-20 ENCOUNTER — Ambulatory Visit (INDEPENDENT_AMBULATORY_CARE_PROVIDER_SITE_OTHER): Payer: 59 | Admitting: Psychiatry

## 2018-09-20 ENCOUNTER — Encounter (HOSPITAL_COMMUNITY): Payer: Self-pay | Admitting: Psychiatry

## 2018-09-20 VITALS — BP 150/78 | HR 70 | Ht 62.0 in | Wt 141.6 lb

## 2018-09-20 DIAGNOSIS — F4310 Post-traumatic stress disorder, unspecified: Secondary | ICD-10-CM | POA: Diagnosis not present

## 2018-09-20 MED ORDER — ARIPIPRAZOLE 10 MG PO TABS: 10.0000 mg | ORAL_TABLET | Freq: Every day | ORAL | 1 refills | Status: DC

## 2018-09-20 MED ORDER — PRAZOSIN HCL 1 MG PO CAPS: 3.0000 mg | ORAL_CAPSULE | Freq: Every day | ORAL | 1 refills | Status: DC

## 2018-09-20 MED ORDER — FLUOXETINE HCL 40 MG PO CAPS: 80.0000 mg | ORAL_CAPSULE | Freq: Every day | ORAL | 0 refills | Status: DC

## 2018-09-20 MED ORDER — BUSPIRONE HCL 15 MG PO TABS: 15.0000 mg | ORAL_TABLET | Freq: Three times a day (TID) | ORAL | 0 refills | Status: DC

## 2018-09-20 NOTE — Patient Instructions (Signed)
1. Increase fluoxetine 80 mg daily  2. Continue Abilify 10 mg daily  3. Increase prazosin 3 mg at night 4. Continue Buspar 15 mg three times a day  5.Return to clinic in one month for 30 mins

## 2018-09-28 ENCOUNTER — Ambulatory Visit (HOSPITAL_COMMUNITY): Payer: Self-pay | Admitting: Licensed Clinical Social Worker

## 2018-10-20 ENCOUNTER — Telehealth: Payer: Self-pay | Admitting: Cardiovascular Disease

## 2018-10-20 NOTE — Telephone Encounter (Signed)
Called pt. No answer, left message for pt to return call.  

## 2018-10-20 NOTE — Telephone Encounter (Signed)
Patient takes lasix 20 mg daily usually.Felt bloated yesterday weight was 142 lbs (same wt at office visit on 09/05/18)  so she decided to take Lasix 80 mg and has not urinated a lot. Today's weight is 145 lbs     Please advise

## 2018-10-20 NOTE — Telephone Encounter (Signed)
Take lasix 60mg  daily Fri, Sat, Sun. Update us again on Monday. Other possibility could be due to norvasc being increased at last visit which can also cause some swellng, if does not improve over weekend may need to make further adjustments, Dr Kirtland BouchardK will be back Monday to further handle  J Kenneth Cuaresma MD

## 2018-10-20 NOTE — Telephone Encounter (Signed)
Left message on pt's cell per chart.Patient to call us back on Monday with update

## 2018-10-20 NOTE — Telephone Encounter (Signed)
pateint calling to see if she can have heart check for fluid  [place] SpanielSaid that she has felt like it for past two days Took 80 mg lasix yesterday

## 2018-10-20 NOTE — Telephone Encounter (Signed)
Patient states that she has been having swelling and is feeling really "full". Patient states that she is having SOB as well. Please advise.  tg

## 2018-10-23 ENCOUNTER — Other Ambulatory Visit: Payer: Self-pay

## 2018-10-23 ENCOUNTER — Emergency Department (HOSPITAL_COMMUNITY)
Admission: EM | Admit: 2018-10-23 | Discharge: 2018-10-23 | Disposition: A | Discharge status: Home / Self Care | Payer: Managed Care, Other (non HMO) | Attending: Emergency Medicine | Admitting: Emergency Medicine

## 2018-10-23 ENCOUNTER — Emergency Department (HOSPITAL_COMMUNITY): Payer: Managed Care, Other (non HMO)

## 2018-10-23 ENCOUNTER — Encounter (HOSPITAL_COMMUNITY): Payer: Self-pay

## 2018-10-23 DIAGNOSIS — Z79899 Other long term (current) drug therapy: Secondary | ICD-10-CM | POA: Insufficient documentation

## 2018-10-23 DIAGNOSIS — F17210 Nicotine dependence, cigarettes, uncomplicated: Secondary | ICD-10-CM | POA: Diagnosis not present

## 2018-10-23 DIAGNOSIS — I110 Hypertensive heart disease with heart failure: Secondary | ICD-10-CM | POA: Diagnosis not present

## 2018-10-23 DIAGNOSIS — J40 Bronchitis, not specified as acute or chronic: Secondary | ICD-10-CM | POA: Insufficient documentation

## 2018-10-23 DIAGNOSIS — J209 Acute bronchitis, unspecified: Secondary | ICD-10-CM

## 2018-10-23 DIAGNOSIS — Z7982 Long term (current) use of aspirin: Secondary | ICD-10-CM | POA: Insufficient documentation

## 2018-10-23 DIAGNOSIS — I2510 Atherosclerotic heart disease of native coronary artery without angina pectoris: Secondary | ICD-10-CM | POA: Diagnosis not present

## 2018-10-23 DIAGNOSIS — J9801 Acute bronchospasm: Secondary | ICD-10-CM | POA: Diagnosis not present

## 2018-10-23 DIAGNOSIS — I509 Heart failure, unspecified: Secondary | ICD-10-CM | POA: Diagnosis not present

## 2018-10-23 DIAGNOSIS — R05 Cough: Secondary | ICD-10-CM | POA: Diagnosis present

## 2018-10-23 LAB — BASIC METABOLIC PANEL
ANION GAP: 7 (ref 5–15)
BUN: 7 mg/dL (ref 6–20)
CALCIUM: 9.1 mg/dL (ref 8.9–10.3)
CO2: 27 mmol/L (ref 22–32)
Chloride: 104 mmol/L (ref 98–111)
Creatinine, Ser: 0.83 mg/dL (ref 0.44–1.00)
Glucose, Bld: 99 mg/dL (ref 70–99)
POTASSIUM: 3.1 mmol/L — AB (ref 3.5–5.1)
Sodium: 138 mmol/L (ref 135–145)

## 2018-10-23 LAB — CBC WITH DIFFERENTIAL/PLATELET
ABS IMMATURE GRANULOCYTES: 0.02 10*3/uL (ref 0.00–0.07)
BASOS ABS: 0 10*3/uL (ref 0.0–0.1)
Basophils Relative: 1 %
EOS ABS: 0 10*3/uL (ref 0.0–0.5)
Eosinophils Relative: 0 %
HEMATOCRIT: 39.1 % (ref 36.0–46.0)
Hemoglobin: 12.4 g/dL (ref 12.0–15.0)
IMMATURE GRANULOCYTES: 1 %
LYMPHS PCT: 56 %
Lymphs Abs: 2.3 10*3/uL (ref 0.7–4.0)
MCH: 29.5 pg (ref 26.0–34.0)
MCHC: 31.7 g/dL (ref 30.0–36.0)
MCV: 93.1 fL (ref 80.0–100.0)
MONOS PCT: 8 %
Monocytes Absolute: 0.3 10*3/uL (ref 0.1–1.0)
NEUTROS ABS: 1.4 10*3/uL — AB (ref 1.7–7.7)
NEUTROS PCT: 34 %
Platelets: 300 10*3/uL (ref 150–400)
RBC: 4.2 MIL/uL (ref 3.87–5.11)
RDW: 14.2 % (ref 11.5–15.5)
WBC: 4.1 10*3/uL (ref 4.0–10.5)
nRBC: 0 % (ref 0.0–0.2)

## 2018-10-23 LAB — TROPONIN I: Troponin I: <0.03 ng/mL (ref <0.03)

## 2018-10-23 LAB — BRAIN NATRIURETIC PEPTIDE: B Natriuretic Peptide: 12 pg/mL (ref 0.0–100.0)

## 2018-10-23 MED ORDER — DOXYCYCLINE HYCLATE 100 MG PO TABS
100.0000 mg | ORAL_TABLET | Freq: Once | ORAL | Status: AC
  Administered 2018-10-23: 100 mg via ORAL
  Filled 2018-10-23: qty 1

## 2018-10-23 MED ORDER — PREDNISONE 20 MG PO TABS: ORAL_TABLET | ORAL | 0 refills | Status: DC

## 2018-10-23 MED ORDER — DOXYCYCLINE HYCLATE 100 MG PO CAPS: 100.0000 mg | ORAL_CAPSULE | Freq: Two times a day (BID) | ORAL | 0 refills | Status: DC

## 2018-10-23 MED ORDER — POTASSIUM CHLORIDE CRYS ER 20 MEQ PO TBCR
40.0000 meq | EXTENDED_RELEASE_TABLET | Freq: Once | ORAL | Status: AC
  Administered 2018-10-23: 40 meq via ORAL
  Filled 2018-10-23: qty 2

## 2018-10-23 MED ORDER — ALBUTEROL (5 MG/ML) CONTINUOUS INHALATION SOLN
10.0000 mg/h | INHALATION_SOLUTION | RESPIRATORY_TRACT | Status: DC
  Administered 2018-10-23: 10 mg/h via RESPIRATORY_TRACT
  Filled 2018-10-23: qty 20

## 2018-10-23 MED ORDER — BENZONATATE 100 MG PO CAPS: 100.0000 mg | ORAL_CAPSULE | Freq: Three times a day (TID) | ORAL | 0 refills | Status: DC | PRN

## 2018-10-23 MED ORDER — METHYLPREDNISOLONE SODIUM SUCC 125 MG IJ SOLR
125.0000 mg | Freq: Once | INTRAMUSCULAR | Status: AC
  Administered 2018-10-23: 125 mg via INTRAMUSCULAR
  Filled 2018-10-23: qty 2

## 2018-10-23 NOTE — Telephone Encounter (Signed)
Patient is still having SOB and states that her BP is still spiking.

## 2018-10-23 NOTE — ED Triage Notes (Signed)
Pt reports history of CHF and says has felt like she has fluid build up.  Pt denies swelling in her feet but says hands have been swollen.  Reports has had cough and congestion recently.  Pt also says her bp has been spiking.

## 2018-10-23 NOTE — ED Provider Notes (Signed)
[facility] EMERGENCY DEPARTMENT Provider Note   CSN: [id number] Arrival date & time: 10/23/18  1234     History   Chief Complaint Chief Complaint  Patient presents with  . Shortness of Breath    HPI [name] is a 52 y.o. female.  HPI Patient with history of CHF, emphysema and coronary artery disease presents with 1 week of chest congestion and nonproductive cough.  She is had dyspnea especially with lying flat.  States he has had swelling in her bilateral feet which have improved recently.  Denies any fever or chills.  Denies chest pain.  States.  She is been using her rescue inhaler frequently at home. Past Medical History:  Diagnosis Date  . ADD (attention deficit disorder with hyperactivity)   . Anxiety   . Bipolar 1 disorder (HCC)   . Carotid artery disease (HCC)    Right 50-69% stenosis by doppler 2010  . CHF (congestive heart failure) (HCC)   . Coronary artery disease    s/p 3V CABG '09  . CVA (cerebral infarction) 12/2008   Reports a neurologist told her she had a blood clot in her brain  . Depression   . Emphysema of lung (HCC) 06/19/2018  . Fibromyalgia   . GERD (gastroesophageal reflux disease)   . Hyperlipidemia   . Hypertension   . Tobacco abuse     Patient Active Problem List   Diagnosis Date Noted  . PTSD (post-traumatic stress disorder) 07/21/2018  . Emphysema of lung (HCC) 06/19/2018  . GAD (generalized anxiety disorder) 06/19/2018  . Panic disorder 06/19/2018  . CAD (coronary artery disease) 03/01/2012  . Chest pain 09/18/2011  . Tobacco abuse 09/18/2011  . CEREBROVASCULAR DISEASE 10/07/2009  . Mood disorder in conditions classified elsewhere 09/23/2009  . TOBACCO ABUSE 09/23/2009  . RESTLESS LEG SYNDROME 09/23/2009  . HYPERTENSION 09/23/2009  . GERD 09/23/2009  . FIBROMYALGIA 09/23/2009    Past Surgical History:  Procedure Laterality Date  . CARDIAC CATHETERIZATION    . CORONARY ARTERY BYPASS GRAFT  2009   3V CABG LIMA --> LAD,  SVG --> Ramus Intermediate,  SVG --> RCA  . TUBAL LIGATION  1990     OB History   No obstetric history on file.      Home Medications    Prior to Admission medications   Medication Sig Start Date End Date Taking? Authorizing Provider  ALPRAZolam Prudy Feeler) 0.5 MG tablet Take 0.5 mg by mouth 3 (three) times daily as needed. For anxiety  ** [facility]  90 DAY SUPPLY**   Yes [provider]  amLODipine (NORVASC) 10 MG tablet Take 1 tablet (10 mg total) by mouth daily. 09/05/18  Yes Laqueta Linden, MD  ARIPiprazole (ABILIFY) 10 MG tablet Take 1 tablet (10 mg total) by mouth daily. 09/20/18  Yes Neysa Hotter, MD  aspirin EC 81 MG tablet Take 81 mg by mouth daily.   Yes [provider]  budesonide-formoterol (SYMBICORT) 160-4.5 MCG/ACT inhaler Inhale 2 puffs into the lungs 2 (two) times daily.   Yes [provider]  busPIRone (BUSPAR) 15 MG tablet Take 1 tablet (15 mg total) by mouth 3 (three) times daily. 09/20/18  Yes Neysa Hotter, MD  cyclobenzaprine (FLEXERIL) 10 MG tablet Take 10 mg by mouth 2 (two) times daily as needed for muscle spasms.    Yes [provider]  diphenoxylate-atropine (LOMOTIL) 2.5-0.025 MG tablet Take 2.5 mg by mouth daily as needed.    Yes [provider]  esomeprazole (  NEXIUM) 40 MG capsule Take 40 mg by mouth every morning.   Yes [provider]  FLUoxetine (PROZAC) 40 MG capsule Take 2 capsules (80 mg total) by mouth daily. 09/20/18  Yes Neysa HotterHisada, Reina, MD  furosemide (LASIX) 20 MG tablet Take 20 mg daily as needed for leg swelling Patient taking differently: Take 20 mg by mouth daily as needed for fluid.  11/28/15  Yes Laqueta LindenKoneswaran, Suresh A, MD  nitroGLYCERIN (NITROSTAT) 0.4 MG SL tablet Place 1 tablet (0.4 mg total) under the tongue every 5 (five) minutes x 3 doses as needed for chest pain (if no relief after 3rd dose, proceed to the ED for an evaluation). 11/21/17  Yes Laqueta LindenKoneswaran, Suresh A, MD  potassium  chloride (KLOR-CON) 20 MEQ packet Take 20 mEq by mouth daily.   Yes [provider]  prazosin (MINIPRESS) 1 MG capsule Take 3 capsules (3 mg total) by mouth at bedtime. Patient taking differently: Take 1-3 mg by mouth at bedtime.  09/20/18  Yes Neysa HotterHisada, Reina, MD  PROAIR HFA 108 [phone](90 Base) MCG/ACT inhaler Inhale 2 puffs into the lungs every 6 (six) hours as needed for wheezing or shortness of breath.  08/22/15  Yes [provider]  ranolazine (RANEXA) 500 MG 12 hr tablet Take 1 tablet (500 mg total) by mouth 2 (two) times daily. 07/07/18  Yes Laqueta LindenKoneswaran, Suresh A, MD  rosuvastatin (CRESTOR) 5 MG tablet Take 1 tablet (5 mg total) by mouth daily. 03/23/16  Yes Laqueta LindenKoneswaran, Suresh A, MD  topiramate (TOPAMAX) 50 MG tablet Take 50 mg by mouth 2 (two) times daily.   Yes [provider]  benzonatate (TESSALON) 100 MG capsule Take 1 capsule (100 mg total) by mouth 3 (three) times daily as needed for cough. 10/23/18   Loren RacerYelverton, [place] Kinyon, MD  doxycycline (VIBRAMYCIN) 100 MG capsule Take 1 capsule (100 mg total) by mouth 2 (two) times daily. One po bid x 7 days 10/23/18   Loren RacerYelverton, Dinh Ayotte, MD  predniSONE (DELTASONE) 20 MG tablet 3 tabs po day one, then 2 po daily x 4 days 10/24/18   Loren RacerYelverton, Arthur Aydelotte, MD  rOPINIRole (REQUIP) 1 MG tablet Take 1 mg by mouth at bedtime as needed (for restless leg).     [provider]    Family History Family History  Problem Relation Age of Onset  . Intellectual disability Maternal Aunt   . Intellectual disability Maternal Uncle   . Alcohol abuse Paternal Grandfather     Social History Social History   Tobacco Use  . Smoking status: Current Every Day Smoker    Packs/day: 0.50    Years: 35.00    Pack years: 17.50    Types: Cigarettes    Start date: 12/14/1981  . Smokeless tobacco: Never Used  Substance Use Topics  . Alcohol use: Not Currently  . Drug use: No     Allergies   Patient has no known allergies.   Review of  Systems Review of Systems  Constitutional: Positive for fatigue. Negative for chills and fever.  HENT: Positive for congestion and sinus pressure.   Respiratory: Positive for cough, chest tightness, shortness of breath and wheezing.   Cardiovascular: Positive for leg swelling. Negative for chest pain and palpitations.  Gastrointestinal: Negative for abdominal pain, constipation, diarrhea, nausea and vomiting.  Genitourinary: Negative for dysuria, flank pain and frequency.  Musculoskeletal: Negative for back pain, myalgias and neck pain.  Skin: Negative for rash and wound.  Neurological: Negative for dizziness, weakness, light-headedness, numbness and headaches.  All  other systems reviewed and are negative.    Physical Exam Updated Vital Signs BP 131/73   Pulse (!) 56   Temp 98.1 F (36.7 C) (Oral)   Resp 17   Ht 5\' 2"  (1.575 m)   Wt 65.8 kg   LMP 10/22/2015   SpO2 100%   BMI 26.52 kg/m   Physical Exam Vitals signs and nursing note reviewed.  Constitutional:      General: She is not in acute distress.    Appearance: Normal appearance. She is well-developed. She is not ill-appearing.  HENT:     Head: Normocephalic and atraumatic.     Nose: Congestion present.     Mouth/Throat:     Mouth: Mucous membranes are moist.     Pharynx: No oropharyngeal exudate or posterior oropharyngeal erythema.  Eyes:     Extraocular Movements: Extraocular movements intact.     Pupils: Pupils are equal, round, and reactive to light.  Neck:     Musculoskeletal: Normal range of motion and neck supple. No neck rigidity or muscular tenderness.     Vascular: No carotid bruit.  Cardiovascular:     Rate and Rhythm: Normal rate and regular rhythm.     Heart sounds: No murmur. No friction rub. No gallop.   Pulmonary:     Effort: Pulmonary effort is normal.     Comments: Diminished breath sounds bilateral bases. Abdominal:     General: Bowel sounds are normal.     Palpations: Abdomen is soft.      Tenderness: There is no abdominal tenderness. There is no guarding or rebound.  Musculoskeletal: Normal range of motion.        General: No swelling, tenderness, deformity or signs of injury.     Right lower leg: No edema.     Left lower leg: No edema.  Lymphadenopathy:     Cervical: No cervical adenopathy.  Skin:    General: Skin is warm and dry.     Findings: No erythema or rash.  Neurological:     General: No focal deficit present.     Mental Status: She is alert and oriented to person, place, and time.     Comments: Moving all extremities without focal deficit.  Sensation intact.  Psychiatric:        Behavior: Behavior normal.      ED Treatments / Results  Labs (all labs ordered are listed, but only abnormal results are displayed) Labs Reviewed  CBC WITH DIFFERENTIAL/PLATELET - Abnormal; Notable for the following components:      Result Value   Neutro Abs 1.4 (*)    All other components within normal limits  BASIC METABOLIC PANEL - Abnormal; Notable for the following components:   Potassium 3.1 (*)    All other components within normal limits  TROPONIN I  BRAIN NATRIURETIC PEPTIDE    EKG EKG Interpretation  Date/Time:  Monday October 23 2018 13:08:32 EST Ventricular Rate:  60 PR Interval:  124 QRS Duration: 100 QT Interval:  412 QTC Calculation: 412 R Axis:   89 Text Interpretation:  Normal sinus rhythm Nonspecific T wave abnormality Abnormal ECG Confirmed by Loren Racer ([id number]) on 10/23/2018 4:56:34 PM   Radiology Dg Chest 2 View  Result Date: 10/23/2018 CLINICAL DATA:  Bilateral hand swelling, dyspnea, nonproductive cough, shortness of breath, duration of symptoms several days. History of emphysema, CHF. EXAM: CHEST - 2 VIEW COMPARISON:  PA and lateral chest x-ray of November 20, 2017 FINDINGS: The lungs remain hyperinflated. There is  no focal infiltrate. There is no pleural effusion. The heart and pulmonary vascularity are normal. There are post CABG  changes. The pulmonary vascularity is not engorged. There is biapical pleural thickening. The bony thorax exhibits no acute abnormality. IMPRESSION: COPD.  Previous CABG.  No pneumonia nor CHF. Electronically Signed   By: Keefe Zawistowski  [place] M.D.   On: 10/23/2018 13:47    Procedures Procedures (including critical care time)  Medications Ordered in ED Medications  albuterol (PROVENTIL,VENTOLIN) solution continuous neb (10 mg/hr Nebulization New Bag/Given 10/23/18 1541)  doxycycline (VIBRA-TABS) tablet 100 mg (has no administration in time range)  potassium chloride SA (K-DUR,KLOR-CON) CR tablet 40 mEq (40 mEq Oral Given 10/23/18 1544)  methylPREDNISolone sodium succinate (SOLU-MEDROL) 125 mg/2 mL injection 125 mg (125 mg Intramuscular Given 10/23/18 1547)     Initial Impression / Assessment and Plan / ED Course  I have reviewed the triage vital signs and the nursing notes.  Pertinent labs & imaging results that were available during my care of the patient were reviewed by me and considered in my medical decision making (see chart for details).     Chest x-ray without acute findings.  Normal BNP.  Suspect likely bronchitis with bronchospasm.  Patient's symptoms have improved significantly after continuous nebulized treatment.  Increased air movement and bilateral bases.  Will discharge home with short course of steroids and antibiotic given history of emphysema.  Strict return precautions given.  Final Clinical Impressions(s) / ED Diagnoses   Final diagnoses:  Bronchitis with bronchospasm    ED Discharge Orders         Ordered    predniSONE (DELTASONE) 20 MG tablet     10/23/18 1654    doxycycline (VIBRAMYCIN) 100 MG capsule  2 times daily     10/23/18 1654    benzonatate (TESSALON) 100 MG capsule  3 times daily PRN     10/23/18 1654           Loren RacerYelverton, Lee Kalt, MD 10/23/18 1657

## 2018-10-23 NOTE — Telephone Encounter (Signed)
Patient currently at [place] ED.   
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
## 2018-11-14 ENCOUNTER — Ambulatory Visit (HOSPITAL_COMMUNITY): Payer: 59 | Admitting: Psychiatry

## 2018-11-15 ENCOUNTER — Ambulatory Visit (HOSPITAL_COMMUNITY): Payer: Self-pay | Admitting: Licensed Clinical Social Worker

## 2018-11-15 ENCOUNTER — Encounter

## 2018-12-05 ENCOUNTER — Ambulatory Visit (HOSPITAL_COMMUNITY): Payer: Self-pay | Admitting: Licensed Clinical Social Worker

## 2018-12-06 ENCOUNTER — Ambulatory Visit: Payer: Managed Care, Other (non HMO) | Admitting: Cardiovascular Disease

## 2018-12-06 ENCOUNTER — Encounter: Payer: Self-pay | Admitting: Cardiovascular Disease

## 2018-12-06 VITALS — BP 122/71 | HR 52 | Ht 62.0 in | Wt 148.2 lb

## 2018-12-06 DIAGNOSIS — I5032 Chronic diastolic (congestive) heart failure: Secondary | ICD-10-CM

## 2018-12-06 DIAGNOSIS — R001 Bradycardia, unspecified: Secondary | ICD-10-CM

## 2018-12-06 DIAGNOSIS — G4733 Obstructive sleep apnea (adult) (pediatric): Secondary | ICD-10-CM

## 2018-12-06 DIAGNOSIS — I25708 Atherosclerosis of coronary artery bypass graft(s), unspecified, with other forms of angina pectoris: Secondary | ICD-10-CM

## 2018-12-06 DIAGNOSIS — M7989 Other specified soft tissue disorders: Secondary | ICD-10-CM

## 2018-12-06 DIAGNOSIS — E785 Hyperlipidemia, unspecified: Secondary | ICD-10-CM | POA: Diagnosis not present

## 2018-12-06 DIAGNOSIS — R5383 Other fatigue: Secondary | ICD-10-CM

## 2018-12-06 DIAGNOSIS — I739 Peripheral vascular disease, unspecified: Secondary | ICD-10-CM

## 2018-12-06 DIAGNOSIS — I10 Essential (primary) hypertension: Secondary | ICD-10-CM | POA: Diagnosis not present

## 2018-12-06 DIAGNOSIS — I779 Disorder of arteries and arterioles, unspecified: Secondary | ICD-10-CM

## 2018-12-06 MED ORDER — FUROSEMIDE 20 MG PO TABS: 20.0000 mg | ORAL_TABLET | Freq: Two times a day (BID) | ORAL | 6 refills | Status: DC

## 2018-12-06 MED ORDER — POTASSIUM CHLORIDE 20 MEQ PO PACK: 20.0000 meq | PACK | Freq: Two times a day (BID) | ORAL | 6 refills | Status: DC

## 2018-12-06 NOTE — Progress Notes (Signed)
SUBJECTIVE: The patient presents for routine follow-up.  She was evaluated in the ED on 10/23/2018 for shortness of breath.  She had been having some feet swelling for which Lasix was increased prior to this ED evaluation. She was given albuterol, doxycycline, and Solu-Medrol.  EKG, chest x-ray, opponent and BNP were unremarkable.  X-ray showed COPD.  It was suspect that she likely had bronchitis.  Potassium was low and she was given replacement therapy.  I personally reviewed the ECG which demonstrated sinus rhythm with nonspecific ST segment and T wave abnormalities.  She has a history of coronary artery disease and CABG as well as CVA and panic disorder.  She underwent a low risk nuclear stress test on 11/24/2015. Echocardiogramon 11/23/2017 was normal, LVEF 60 to 65%.  She has a history of sleep apnea but could not afford CPAP.  She feels fatigued.  She has some exertional dyspnea.  She has some mild swelling of the hands and feet.  She has occasional lightheadedness and dizziness.  She takes Topamax for headaches.  She takes gabapentin for neuropathy/burning sensation of skin in both forearms.  Social history: She is married.  She has 2 dogs and 2 ferrets.  Review of Systems: As per "subjective", otherwise negative.  No Known Allergies  Current Outpatient Medications  Medication Sig Dispense Refill  . ALPRAZolam (XANAX) 0.5 MG tablet Take 0.5 mg by mouth 3 (three) times daily as needed. For anxiety  ** [employer] SCRIPTS  90 DAY SUPPLY**    . amLODipine (NORVASC) 10 MG tablet Take 1 tablet (10 mg total) by mouth daily. 90 tablet 3  . ARIPiprazole (ABILIFY) 10 MG tablet Take 1 tablet (10 mg total) by mouth daily. 30 tablet 1  . aspirin EC 81 MG tablet Take 81 mg by mouth daily.    . benzonatate (TESSALON) 100 MG capsule Take 1 capsule (100 mg total) by mouth 3 (three) times daily as needed for cough. 21 capsule 0  . budesonide-formoterol (SYMBICORT) 160-4.5 MCG/ACT inhaler Inhale  2 puffs into the lungs 2 (two) times daily.    . busPIRone (BUSPAR) 15 MG tablet Take 1 tablet (15 mg total) by mouth 3 (three) times daily. 90 tablet 0  . cyclobenzaprine (FLEXERIL) 10 MG tablet Take 10 mg by mouth 2 (two) times daily as needed for muscle spasms.     . diphenoxylate-atropine (LOMOTIL) 2.5-0.025 MG tablet Take 2.5 mg by mouth daily as needed.     Marland Kitchen esomeprazole (NEXIUM) 40 MG capsule Take 40 mg by mouth every morning.    Marland Kitchen FLUoxetine (PROZAC) 40 MG capsule Take 2 capsules (80 mg total) by mouth daily. 180 capsule 0  . furosemide (LASIX) 20 MG tablet Take 20 mg daily as needed for leg swelling (Patient taking differently: Take 20 mg by mouth daily as needed for fluid. ) 90 tablet 3  . gabapentin (NEURONTIN) 300 MG capsule Take 1 capsule by mouth 4 (four) times daily.    . nitroGLYCERIN (NITROSTAT) 0.4 MG SL tablet Place 1 tablet (0.4 mg total) under the tongue every 5 (five) minutes x 3 doses as needed for chest pain (if no relief after 3rd dose, proceed to the ED for an evaluation). 25 tablet 3  . potassium chloride (KLOR-CON) 20 MEQ packet Take 20 mEq by mouth daily.    . prazosin (MINIPRESS) 1 MG capsule Take 3 capsules (3 mg total) by mouth at bedtime. (Patient taking differently: Take 1-3 mg by mouth at bedtime. ) 90  capsule 1  . PROAIR HFA 108 (90 Base) MCG/ACT inhaler Inhale 2 puffs into the lungs every 6 (six) hours as needed for wheezing or shortness of breath.   3  . ranolazine (RANEXA) 500 MG 12 hr tablet Take 1 tablet (500 mg total) by mouth 2 (two) times daily. 60 tablet 3  . rOPINIRole (REQUIP) 1 MG tablet Take 1 mg by mouth at bedtime as needed (for restless leg).     . rosuvastatin (CRESTOR) 5 MG tablet Take 1 tablet (5 mg total) by mouth daily. 90 tablet 3  . topiramate (TOPAMAX) 50 MG tablet Take 50 mg by mouth daily.     . Vitamin D, Ergocalciferol, (DRISDOL) 1.25 MG (50000 UT) CAPS capsule Take 50,000 Units by mouth once a week.    . zolpidem (AMBIEN) 10 MG tablet  Take 1 tablet by mouth at bedtime as needed.     No current facility-administered medications for this visit.     Past Medical History:  Diagnosis Date  . ADD (attention deficit disorder with hyperactivity)   . Anxiety   . Bipolar 1 disorder (HCC)   . Carotid artery disease (HCC)    Right 50-69% stenosis by doppler 2010  . CHF (congestive heart failure) (HCC)   . Coronary artery disease    s/p 3V CABG '09  . CVA (cerebral infarction) 12/2008   Reports a neurologist told her she had a blood clot in her brain  . Depression   . Emphysema of lung (HCC) 06/19/2018  . Fibromyalgia   . GERD (gastroesophageal reflux disease)   . Hyperlipidemia   . Hypertension   . Tobacco abuse     Past Surgical History:  Procedure Laterality Date  . CARDIAC CATHETERIZATION    . CORONARY ARTERY BYPASS GRAFT  2009   3V CABG LIMA --> LAD, SVG --> Ramus Intermediate,  SVG --> RCA  . TUBAL LIGATION  1990    Social History   Socioeconomic History  . Marital status: Married    Spouse name: Not on file  . Number of children: Not on file  . Years of education: Not on file  . Highest education level: Not on file  Occupational History  . Occupation: Unemployed    Comment: Applicatin for disability pending, inadequate funds to afford medical care  Social Needs  . Financial resource strain: Not on file  . Food insecurity:    Worry: Not on file    Inability: Not on file  . Transportation needs:    Medical: Not on file    Non-medical: Not on file  Tobacco Use  . Smoking status: Current Every Day Smoker    Packs/day: 0.50    Years: 35.00    Pack years: 17.50    Types: Cigarettes    Start date: 12/14/1981  . Smokeless tobacco: Never Used  Substance and Sexual Activity  . Alcohol use: Not Currently  . Drug use: No  . Sexual activity: Yes  Lifestyle  . Physical activity:    Days per week: Not on file    Minutes per session: Not on file  . Stress: Not on file  Relationships  . Social  connections:    Talks on phone: Not on file    Gets together: Not on file    Attends religious service: Not on file    Active member of club or organization: Not on file    Attends meetings of clubs or organizations: Not on file    Relationship status:  Not on file  . Intimate partner violence:    Fear of current or ex partner: Not on file    Emotionally abused: Not on file    Physically abused: Not on file    Forced sexual activity: Not on file  Other Topics Concern  . Not on file  Social History Narrative   Divorced with 2 children     Vitals:   12/06/18 0849  BP: 122/71  Pulse: (!) 52  SpO2: 97%  Weight: 148 lb 3.2 oz (67.2 kg)  Height: 5\' 2"  (1.575 m)    Wt Readings from Last 3 Encounters:  12/06/18 148 lb 3.2 oz (67.2 kg)  10/23/18 145 lb (65.8 kg)  09/05/18 142 lb (64.4 kg)     PHYSICAL EXAM General: NAD HEENT: Normal. Neck: No JVD, no thyromegaly. Lungs: Clear to auscultation bilaterally with normal respiratory effort. CV: Bradycardic, regular rhythm, normal S1/S2, no S3/S4, no murmur. No pretibial or periankle edema.  No carotid bruit.   Abdomen: Soft, nontender, no distention.  Neurologic: Alert and oriented.  Psych: Normal affect. Skin: Normal. Musculoskeletal: No gross deformities.    ECG: Reviewed above under Subjective   Labs: Lab Results  Component Value Date/Time   K 3.1 (L) 10/23/2018 01:22 PM   BUN 7 10/23/2018 01:22 PM   CREATININE 0.83 10/23/2018 01:22 PM   ALT 11 03/01/2012 04:50 AM   TSH 0.555 03/01/2012 04:50 AM   HGB 12.4 10/23/2018 01:22 PM     Lipids: Lab Results  Component Value Date/Time   LDLCALC 98 09/29/2009 08:37 PM   CHOL 178 09/29/2009 08:37 PM   TRIG 185 (H) 09/29/2009 08:37 PM   HDL 43 09/29/2009 08:37 PM       ASSESSMENT AND PLAN: 1. CAD with CABG: Symptomatically stable. Echocardiogram in January 2019 demonstrated normal left ventricular systolic and diastolic function. Exercise Cardiolite stress test was  low risk 11/24/15. Continue ASA, ranolazine, and rosuvastatin.  She did not tolerate long-acting nitrates.  She is bradycardic so I am not prescribing beta-blockers.  2. Essential HTN: Blood pressure is normal after increase of amlodipine to 10 mg.  No changes.  3. Bilateral carotid artery disease:I will repeat Dopplers. Continue ASA and statin.  4. Hyperlipidemia:ContinueCrestor 5 mg daily.  I will check lipids if not checked by PCP.  5. Chronic diastolic heart failure:Currently taking Lasix 20 mg daily with supplemental potassium.  Due to increased swelling in hands and feet, I will increase to twice daily dosing of both.  6.  Obstructive sleep apnea/fatigue: She is bradycardic and fatigued and symptoms are likely due to obstructive sleep apnea.  Unfortunately, she was unable to afford CPAP.  I recommended she follow-up with Dr. Juanetta GoslingHawkins who prescribed it.   Disposition: Follow up 3 months   Prentice DockerSuresh , M.D., F.A.C.C.

## 2018-12-06 NOTE — Patient Instructions (Addendum)
Medication Instructions:   Increase Lasix to 20mg  twice a day.  Increase Potassium to 20meq twice a day.  Continue all current medications.  Labwork:  BMET - order given today.   Please do in 2 weeks.   Office will contact with results via phone or letter.    Testing/Procedures:  Your physician has requested that you have a carotid duplex. This test is an ultrasound of the carotid arteries in your neck. It looks at blood flow through these arteries that supply the brain with blood. Allow one hour for this exam. There are no restrictions or special instructions.  Office will contact with results via phone or letter.    Follow-Up: 3 months   Any Other Special Instructions Will Be Listed Below (If Applicable). Reminder - please call Dr. Juanetta GoslingHawkins to see him to follow up on your sleep apnea.  If you need a refill on your cardiac medications before your next appointment, please call your pharmacy.

## 2018-12-06 NOTE — Addendum Note (Signed)
Addended by: Lesle Chris on: 12/06/2018 09:38 AM   Modules accepted: Orders

## 2018-12-07 ENCOUNTER — Encounter: Payer: Self-pay | Admitting: Cardiovascular Disease

## 2018-12-07 ENCOUNTER — Encounter: Payer: Self-pay | Admitting: *Deleted

## 2018-12-14 ENCOUNTER — Other Ambulatory Visit: Payer: Self-pay | Admitting: *Deleted

## 2018-12-14 DIAGNOSIS — E785 Hyperlipidemia, unspecified: Secondary | ICD-10-CM

## 2018-12-27 ENCOUNTER — Ambulatory Visit (INDEPENDENT_AMBULATORY_CARE_PROVIDER_SITE_OTHER): Payer: Managed Care, Other (non HMO)

## 2018-12-27 DIAGNOSIS — I779 Disorder of arteries and arterioles, unspecified: Secondary | ICD-10-CM | POA: Diagnosis not present

## 2018-12-27 DIAGNOSIS — I739 Peripheral vascular disease, unspecified: Principal | ICD-10-CM

## 2018-12-31 ENCOUNTER — Observation Stay (HOSPITAL_COMMUNITY): Payer: Managed Care, Other (non HMO)

## 2018-12-31 ENCOUNTER — Emergency Department (HOSPITAL_COMMUNITY): Payer: Managed Care, Other (non HMO)

## 2018-12-31 ENCOUNTER — Other Ambulatory Visit: Payer: Self-pay

## 2018-12-31 ENCOUNTER — Encounter (HOSPITAL_COMMUNITY): Payer: Self-pay | Admitting: Emergency Medicine

## 2018-12-31 ENCOUNTER — Observation Stay (HOSPITAL_COMMUNITY)
Admission: EM | Admit: 2018-12-31 | Discharge: 2019-01-02 | Disposition: A | Discharge status: Home / Self Care | Payer: Managed Care, Other (non HMO) | Attending: Internal Medicine | Admitting: Internal Medicine

## 2018-12-31 DIAGNOSIS — Z951 Presence of aortocoronary bypass graft: Secondary | ICD-10-CM | POA: Insufficient documentation

## 2018-12-31 DIAGNOSIS — F419 Anxiety disorder, unspecified: Secondary | ICD-10-CM | POA: Diagnosis present

## 2018-12-31 DIAGNOSIS — G4733 Obstructive sleep apnea (adult) (pediatric): Secondary | ICD-10-CM | POA: Diagnosis not present

## 2018-12-31 DIAGNOSIS — I5032 Chronic diastolic (congestive) heart failure: Secondary | ICD-10-CM | POA: Diagnosis present

## 2018-12-31 DIAGNOSIS — R0602 Shortness of breath: Secondary | ICD-10-CM | POA: Insufficient documentation

## 2018-12-31 DIAGNOSIS — Z87891 Personal history of nicotine dependence: Secondary | ICD-10-CM | POA: Insufficient documentation

## 2018-12-31 DIAGNOSIS — J439 Emphysema, unspecified: Secondary | ICD-10-CM | POA: Insufficient documentation

## 2018-12-31 DIAGNOSIS — Z720 Tobacco use: Secondary | ICD-10-CM | POA: Diagnosis present

## 2018-12-31 DIAGNOSIS — I10 Essential (primary) hypertension: Secondary | ICD-10-CM | POA: Diagnosis present

## 2018-12-31 DIAGNOSIS — I110 Hypertensive heart disease with heart failure: Secondary | ICD-10-CM | POA: Insufficient documentation

## 2018-12-31 DIAGNOSIS — R079 Chest pain, unspecified: Secondary | ICD-10-CM | POA: Diagnosis not present

## 2018-12-31 DIAGNOSIS — R0789 Other chest pain: Secondary | ICD-10-CM | POA: Diagnosis present

## 2018-12-31 DIAGNOSIS — I2510 Atherosclerotic heart disease of native coronary artery without angina pectoris: Secondary | ICD-10-CM | POA: Diagnosis not present

## 2018-12-31 DIAGNOSIS — Z9989 Dependence on other enabling machines and devices: Secondary | ICD-10-CM

## 2018-12-31 HISTORY — DX: Dependence on other enabling machines and devices: Z99.89

## 2018-12-31 HISTORY — DX: Obstructive sleep apnea (adult) (pediatric): G47.33

## 2018-12-31 LAB — BASIC METABOLIC PANEL
Anion gap: 9 (ref 5–15)
BUN: 12 mg/dL (ref 6–20)
CO2: 23 mmol/L (ref 22–32)
Calcium: 8.8 mg/dL — ABNORMAL LOW (ref 8.9–10.3)
Chloride: 107 mmol/L (ref 98–111)
Creatinine, Ser: 0.78 mg/dL (ref 0.44–1.00)
GFR calc Af Amer: >60 mL/min (ref >60)
GFR calc non Af Amer: >60 mL/min (ref >60)
Glucose, Bld: 100 mg/dL — ABNORMAL HIGH (ref 70–99)
Potassium: 3.7 mmol/L (ref 3.5–5.1)
Sodium: 139 mmol/L (ref 135–145)

## 2018-12-31 LAB — CBC
HEMATOCRIT: 34.5 % — AB (ref 36.0–46.0)
Hemoglobin: 10.7 g/dL — ABNORMAL LOW (ref 12.0–15.0)
MCH: 29.1 pg (ref 26.0–34.0)
MCHC: 31 g/dL (ref 30.0–36.0)
MCV: 93.8 fL (ref 80.0–100.0)
Platelets: 303 10*3/uL (ref 150–400)
RBC: 3.68 MIL/uL — ABNORMAL LOW (ref 3.87–5.11)
RDW: 13.2 % (ref 11.5–15.5)
WBC: 8.4 10*3/uL (ref 4.0–10.5)
nRBC: 0 % (ref 0.0–0.2)

## 2018-12-31 LAB — BRAIN NATRIURETIC PEPTIDE: B Natriuretic Peptide: 303 pg/mL — ABNORMAL HIGH (ref 0.0–100.0)

## 2018-12-31 LAB — TSH: TSH: 1.865 u[IU]/mL (ref 0.350–4.500)

## 2018-12-31 LAB — TROPONIN I
Troponin I: <0.03 ng/mL (ref <0.03)
Troponin I: <0.03 ng/mL (ref <0.03)
Troponin I: <0.03 ng/mL (ref <0.03)

## 2018-12-31 LAB — D-DIMER, QUANTITATIVE: D-Dimer, Quant: 0.7 ug/mL-FEU — ABNORMAL HIGH (ref 0.00–0.50)

## 2018-12-31 MED ORDER — RANOLAZINE ER 500 MG PO TB12
500.0000 mg | ORAL_TABLET | Freq: Two times a day (BID) | ORAL | Status: DC
  Administered 2018-12-31 – 2019-01-02 (×4): 500 mg via ORAL
  Filled 2018-12-31 (×4): qty 1

## 2018-12-31 MED ORDER — ACETAMINOPHEN 500 MG PO TABS
1000.0000 mg | ORAL_TABLET | Freq: Once | ORAL | Status: AC
  Administered 2018-12-31: 1000 mg via ORAL
  Filled 2018-12-31: qty 2

## 2018-12-31 MED ORDER — OXYCODONE HCL 5 MG PO TABS
5.0000 mg | ORAL_TABLET | Freq: Once | ORAL | Status: AC
  Administered 2018-12-31: 5 mg via ORAL
  Filled 2018-12-31: qty 1

## 2018-12-31 MED ORDER — ALPRAZOLAM 0.5 MG PO TABS
0.5000 mg | ORAL_TABLET | Freq: Three times a day (TID) | ORAL | Status: DC | PRN
  Administered 2018-12-31 – 2019-01-02 (×3): 0.5 mg via ORAL
  Filled 2018-12-31 (×3): qty 1

## 2018-12-31 MED ORDER — ALUM & MAG HYDROXIDE-SIMETH 200-200-20 MG/5ML PO SUSP
30.0000 mL | Freq: Once | ORAL | Status: AC
  Administered 2018-12-31: 30 mL via ORAL
  Filled 2018-12-31: qty 30

## 2018-12-31 MED ORDER — ASPIRIN EC 81 MG PO TBEC
81.0000 mg | DELAYED_RELEASE_TABLET | Freq: Every day | ORAL | Status: DC
  Administered 2018-12-31 – 2019-01-02 (×3): 81 mg via ORAL
  Filled 2018-12-31 (×3): qty 1

## 2018-12-31 MED ORDER — ONDANSETRON HCL 4 MG/2ML IJ SOLN: 4.0000 mg | Freq: Four times a day (QID) | INTRAMUSCULAR | Status: DC | PRN

## 2018-12-31 MED ORDER — POTASSIUM CHLORIDE 20 MEQ PO PACK
20.0000 meq | PACK | Freq: Two times a day (BID) | ORAL | Status: DC
  Administered 2018-12-31 – 2019-01-02 (×4): 20 meq via ORAL
  Filled 2018-12-31 (×4): qty 1

## 2018-12-31 MED ORDER — LIDOCAINE VISCOUS HCL 2 % MT SOLN
15.0000 mL | Freq: Once | OROMUCOSAL | Status: AC
  Administered 2018-12-31: 15 mL via ORAL
  Filled 2018-12-31: qty 15

## 2018-12-31 MED ORDER — AMLODIPINE BESYLATE 5 MG PO TABS
10.0000 mg | ORAL_TABLET | Freq: Every day | ORAL | Status: DC
  Administered 2018-12-31: 10 mg via ORAL
  Filled 2018-12-31 (×3): qty 2

## 2018-12-31 MED ORDER — ZOLPIDEM TARTRATE 5 MG PO TABS
5.0000 mg | ORAL_TABLET | Freq: Every evening | ORAL | Status: DC | PRN
  Administered 2018-12-31: 5 mg via ORAL
  Filled 2018-12-31: qty 1

## 2018-12-31 MED ORDER — GABAPENTIN 300 MG PO CAPS
300.0000 mg | ORAL_CAPSULE | Freq: Four times a day (QID) | ORAL | Status: DC
  Administered 2018-12-31 – 2019-01-02 (×7): 300 mg via ORAL
  Filled 2018-12-31 (×8): qty 1

## 2018-12-31 MED ORDER — BUSPIRONE HCL 5 MG PO TABS
15.0000 mg | ORAL_TABLET | Freq: Three times a day (TID) | ORAL | Status: DC
  Administered 2018-12-31 – 2019-01-02 (×6): 15 mg via ORAL
  Filled 2018-12-31 (×6): qty 3

## 2018-12-31 MED ORDER — FLUOXETINE HCL 20 MG PO CAPS
80.0000 mg | ORAL_CAPSULE | Freq: Every day | ORAL | Status: DC
  Administered 2018-12-31 – 2019-01-02 (×3): 80 mg via ORAL
  Filled 2018-12-31 (×3): qty 4

## 2018-12-31 MED ORDER — ENOXAPARIN SODIUM 80 MG/0.8ML ~~LOC~~ SOLN
1.0000 mg/kg | Freq: Two times a day (BID) | SUBCUTANEOUS | Status: DC
  Administered 2018-12-31: 70 mg via SUBCUTANEOUS
  Filled 2018-12-31: qty 0.8

## 2018-12-31 MED ORDER — SODIUM CHLORIDE 0.9% FLUSH: 3.0000 mL | Freq: Once | INTRAVENOUS | Status: DC

## 2018-12-31 MED ORDER — PANTOPRAZOLE SODIUM 40 MG PO TBEC
40.0000 mg | DELAYED_RELEASE_TABLET | Freq: Every day | ORAL | Status: DC
  Administered 2018-12-31 – 2019-01-02 (×3): 40 mg via ORAL
  Filled 2018-12-31 (×3): qty 1

## 2018-12-31 MED ORDER — NITROGLYCERIN 0.4 MG SL SUBL: 0.4000 mg | SUBLINGUAL_TABLET | SUBLINGUAL | Status: DC | PRN

## 2018-12-31 MED ORDER — ARIPIPRAZOLE 10 MG PO TABS
10.0000 mg | ORAL_TABLET | Freq: Every day | ORAL | Status: DC
  Administered 2019-01-01 – 2019-01-02 (×2): 10 mg via ORAL
  Filled 2018-12-31 (×2): qty 1

## 2018-12-31 MED ORDER — PRAZOSIN HCL 2 MG PO CAPS
3.0000 mg | ORAL_CAPSULE | Freq: Every day | ORAL | Status: DC
  Administered 2018-12-31: 3 mg via ORAL
  Filled 2018-12-31 (×3): qty 1

## 2018-12-31 MED ORDER — ROSUVASTATIN CALCIUM 10 MG PO TABS
5.0000 mg | ORAL_TABLET | Freq: Every day | ORAL | Status: DC
  Administered 2018-12-31 – 2019-01-02 (×3): 5 mg via ORAL
  Filled 2018-12-31 (×3): qty 1

## 2018-12-31 MED ORDER — CYCLOBENZAPRINE HCL 10 MG PO TABS: 10.0000 mg | ORAL_TABLET | Freq: Two times a day (BID) | ORAL | Status: DC | PRN

## 2018-12-31 MED ORDER — MOMETASONE FURO-FORMOTEROL FUM 200-5 MCG/ACT IN AERO
2.0000 | INHALATION_SPRAY | Freq: Two times a day (BID) | RESPIRATORY_TRACT | Status: DC
  Administered 2018-12-31 – 2019-01-02 (×4): 2 via RESPIRATORY_TRACT
  Filled 2018-12-31 (×2): qty 8.8

## 2018-12-31 MED ORDER — ROPINIROLE HCL 1 MG PO TABS
1.0000 mg | ORAL_TABLET | Freq: Every evening | ORAL | Status: DC | PRN
  Administered 2018-12-31: 1 mg via ORAL

## 2018-12-31 MED ORDER — PRAZOSIN HCL 1 MG PO CAPS
ORAL_CAPSULE | ORAL | Status: AC
  Filled 2018-12-31: qty 3

## 2018-12-31 MED ORDER — TOPIRAMATE 25 MG PO TABS
50.0000 mg | ORAL_TABLET | Freq: Every day | ORAL | Status: DC
  Administered 2018-12-31 – 2019-01-01 (×2): 50 mg via ORAL
  Filled 2018-12-31: qty 2

## 2018-12-31 MED ORDER — FUROSEMIDE 10 MG/ML IJ SOLN
40.0000 mg | Freq: Two times a day (BID) | INTRAMUSCULAR | Status: DC
  Administered 2018-12-31 – 2019-01-01 (×3): 40 mg via INTRAVENOUS
  Filled 2018-12-31 (×4): qty 4

## 2018-12-31 MED ORDER — IPRATROPIUM-ALBUTEROL 0.5-2.5 (3) MG/3ML IN SOLN: 3.0000 mL | Freq: Four times a day (QID) | RESPIRATORY_TRACT | Status: DC | PRN

## 2018-12-31 MED ORDER — ACETAMINOPHEN 325 MG PO TABS
650.0000 mg | ORAL_TABLET | ORAL | Status: DC | PRN
  Administered 2019-01-01: 650 mg via ORAL
  Filled 2018-12-31: qty 2

## 2018-12-31 MED ORDER — NITROGLYCERIN 0.4 MG SL SUBL
0.4000 mg | SUBLINGUAL_TABLET | Freq: Once | SUBLINGUAL | Status: AC
  Administered 2018-12-31: 0.4 mg via SUBLINGUAL
  Filled 2018-12-31: qty 1

## 2018-12-31 MED ORDER — ASPIRIN 81 MG PO CHEW
324.0000 mg | CHEWABLE_TABLET | Freq: Once | ORAL | Status: AC
  Administered 2018-12-31: 324 mg via ORAL
  Filled 2018-12-31: qty 4

## 2018-12-31 MED ORDER — ENOXAPARIN SODIUM 40 MG/0.4ML ~~LOC~~ SOLN
40.0000 mg | SUBCUTANEOUS | Status: DC
  Filled 2018-12-31: qty 0.4

## 2018-12-31 MED ORDER — IOHEXOL 350 MG/ML SOLN
75.0000 mL | Freq: Once | INTRAVENOUS | Status: AC | PRN
  Administered 2018-12-31: 75 mL via INTRAVENOUS

## 2018-12-31 NOTE — ED Notes (Signed)
Pt complains of headache "it's the nitro"

## 2018-12-31 NOTE — ED Notes (Signed)
Pt reports eating a [employer] sandwich last night  Now with pain to chest R shoulder and thru to the back  Unsuccessful IV attempt

## 2018-12-31 NOTE — H&P (Signed)
History and Physical    [name] GLO:[id number] DOB: 1966/09/04 DOA: 12/31/2018  PCP: Samuel Jester, DO  Patient coming from: Home  I have personally briefly reviewed patient's old medical records in [facility]  Chief Complaint: Chest pain  HPI: [name] is a 53 y.o. female with medical history significant of coronary artery disease status post CABG, bipolar disorder, GERD, obstructive sleep apnea, emphysema, presents to the hospital with complaints of chest discomfort and shortness of breath.  Describes her discomfort as a pressure type of pain in the center of her chest which radiates to her back.  She has been having this pain intermittently for a few weeks now, current episode started yesterday.  She has associated shortness of breath which is mainly dyspnea on exertion.  She has diaphoresis.  She has trouble laying flat and endorses orthopnea.  She has felt that her lower extremities have been more edematous lately.  She reports compliance with her medications.  She denied any dizziness or lightheadedness, but does feel very fatigued lately.  She is not had any vomiting or diarrhea.  No fever or cough.  ED Course: EKG and troponin were unremarkable.  She was noted to have heart rate in the 40s, but reports that is been this way for the past few weeks.  Blood pressure appears stable.  Chest x-ray indicates evidence of volume overload.  BNP is elevated above baseline at 303.  Admission has been requested for further evaluation of chest discomfort  Review of Systems: As per HPI otherwise 10 point review of systems negative.    Past Medical History:  Diagnosis Date  . ADD (attention deficit disorder with hyperactivity)   . Anxiety   . Bipolar 1 disorder (HCC)   . Carotid artery disease (HCC)    Right 50-69% stenosis by doppler 2010  . CHF (congestive heart failure) (HCC)   . Coronary artery disease    s/p 3V CABG '09  . CVA (cerebral infarction) 12/2008   Reports a  neurologist told her she had a blood clot in her brain  . Depression   . Emphysema of lung (HCC) 06/19/2018  . Fibromyalgia   . GERD (gastroesophageal reflux disease)   . Hyperlipidemia   . Hypertension   . OSA on CPAP 12/31/2018  . Tobacco abuse     Past Surgical History:  Procedure Laterality Date  . CARDIAC CATHETERIZATION    . CORONARY ARTERY BYPASS GRAFT  2009   3V CABG LIMA --> LAD, SVG --> Ramus Intermediate,  SVG --> RCA  . TUBAL LIGATION  1990    Social History:  reports that she quit smoking about 2 weeks ago. Her smoking use included cigarettes. She started smoking about 37 years ago. She has a 17.50 pack-year smoking history. She has never used smokeless tobacco. She reports previous alcohol use. She reports that she does not use drugs.  No Known Allergies  Family History  Problem Relation Age of Onset  . Intellectual disability Maternal Aunt   . Intellectual disability Maternal Uncle   . Alcohol abuse Paternal Grandfather     Prior to Admission medications   Medication Sig Start Date End Date Taking? Authorizing Provider  ALPRAZolam Prudy Feeler) 0.5 MG tablet Take 0.5 mg by mouth 3 (three) times daily as needed. For anxiety  ** [employer] SCRIPTS  90 DAY SUPPLY**   Yes [provider]  amLODipine (NORVASC) 10 MG tablet Take 1 tablet (10 mg total) by mouth daily. 09/05/18  Yes  Laqueta LindenKoneswaran, Suresh A, MD  ARIPiprazole (ABILIFY) 10 MG tablet Take 1 tablet (10 mg total) by mouth daily. 09/20/18  Yes Neysa HotterHisada, Reina, MD  aspirin EC 81 MG tablet Take 81 mg by mouth daily.   Yes [provider]  benzonatate (TESSALON) 100 MG capsule Take 1 capsule (100 mg total) by mouth 3 (three) times daily as needed for cough. 10/23/18  Yes Loren RacerYelverton, David, MD  budesonide-formoterol [facility](SYMBICORT) 160-4.5 MCG/ACT inhaler Inhale 2 puffs into the lungs 2 (two) times daily.   Yes [provider]  busPIRone (BUSPAR) 15 MG tablet Take 1 tablet (15 mg total) by mouth 3 (three)  times daily. 09/20/18  Yes Neysa HotterHisada, Reina, MD  cyclobenzaprine (FLEXERIL) 10 MG tablet Take 10 mg by mouth 2 (two) times daily as needed for muscle spasms.    Yes [provider]  diphenoxylate-atropine (LOMOTIL) 2.5-0.025 MG tablet Take 2.5 mg by mouth daily as needed.    Yes [provider]  esomeprazole (NEXIUM) 40 MG capsule Take 40 mg by mouth every morning.   Yes [provider]  FLUoxetine (PROZAC) 40 MG capsule Take 2 capsules (80 mg total) by mouth daily. 09/20/18  Yes Neysa HotterHisada, Reina, MD  furosemide (LASIX) 20 MG tablet Take 1 tablet (20 mg total) by mouth 2 (two) times daily. 12/06/18  Yes Laqueta LindenKoneswaran, Suresh A, MD  gabapentin (NEURONTIN) 300 MG capsule Take 1 capsule by mouth 4 (four) times daily. 11/20/18  Yes [provider]  nitroGLYCERIN (NITROSTAT) 0.4 MG SL tablet Place 1 tablet (0.4 mg total) under the tongue every 5 (five) minutes x 3 doses as needed for chest pain (if no relief after 3rd dose, proceed to the ED for an evaluation). 11/21/17  Yes Laqueta LindenKoneswaran, Suresh A, MD  potassium chloride (KLOR-CON) 20 MEQ packet Take 20 mEq by mouth 2 (two) times daily. 12/06/18  Yes Laqueta LindenKoneswaran, Suresh A, MD  prazosin (MINIPRESS) 1 MG capsule Take 3 capsules (3 mg total) by mouth at bedtime. Patient taking differently: Take 1-3 mg by mouth at bedtime.  09/20/18  Yes Neysa HotterHisada, Reina, MD  PROAIR HFA 108 [phone](90 Base) MCG/ACT inhaler Inhale 2 puffs into the lungs every 6 (six) hours as needed for wheezing or shortness of breath.  08/22/15  Yes [provider]  ranolazine (RANEXA) 500 MG 12 hr tablet Take 1 tablet (500 mg total) by mouth 2 (two) times daily. 07/07/18  Yes Laqueta LindenKoneswaran, Suresh A, MD  rOPINIRole (REQUIP) 1 MG tablet Take 1 mg by mouth at bedtime as needed (for restless leg).    Yes [provider]  rosuvastatin (CRESTOR) 5 MG tablet Take 1 tablet (5 mg total) by mouth daily. 03/23/16  Yes Laqueta LindenKoneswaran, Suresh A, MD  topiramate (TOPAMAX) 50 MG tablet  Take 50 mg by mouth daily.    Yes [provider]  Vitamin D, Ergocalciferol, (DRISDOL) 1.25 MG (50000 UT) CAPS capsule Take 50,000 Units by mouth once a week. 10/23/18  Yes [provider]  zolpidem (AMBIEN) 10 MG tablet Take 1 tablet by mouth at bedtime as needed. 11/23/18  Yes [provider]    Physical Exam: Vitals:   12/31/18 1300 12/31/18 1330 12/31/18 1530 12/31/18 1643  BP: (!) 158/58 (!) 152/61 127/62 (!) 145/68  Pulse:      Resp: 18 19 18 16   Temp:    (!) 97.5 F (36.4 C)  TempSrc:    Oral  SpO2: 95% 95% 91% 98%  Weight:    70.1 kg  Height:  5\' 2"  (1.575 m)    Constitutional: NAD, calm, comfortable Eyes: PERRL, lids and conjunctivae normal ENMT: Mucous membranes are moist. Posterior pharynx clear of any exudate or lesions.Normal dentition.  Neck: normal, supple, no masses, no thyromegaly Respiratory: Crackles at bases bilaterally. Normal respiratory effort. No accessory muscle use.  Cardiovascular: Regular rate and rhythm, no murmurs / rubs / gallops. No extremity edema. 2+ pedal pulses. No carotid bruits.  Abdomen: no tenderness, no masses palpated. No hepatosplenomegaly. Bowel sounds positive.  Musculoskeletal: no clubbing / cyanosis. No joint deformity upper and lower extremities. Good ROM, no contractures. Normal muscle tone.  Skin: no rashes, lesions, ulcers. No induration Neurologic: CN 2-12 grossly intact. Sensation intact, DTR normal. Strength 5/5 in all 4.  Psychiatric: Normal judgment and insight. Alert and oriented x 3. Normal mood.    Labs on Admission: I have personally reviewed following labs and imaging studies  CBC: Recent Labs  Lab 12/31/18 1008  WBC 8.4  HGB 10.7*  HCT 34.5*  MCV 93.8  PLT 303   Basic Metabolic Panel: Recent Labs  Lab 12/31/18 1008  NA 139  K 3.7  CL 107  CO2 23  GLUCOSE 100*  BUN 12  CREATININE 0.78  CALCIUM 8.8*   GFR: Estimated Creatinine Clearance: 74.6 mL/min (by C-G formula based  on SCr of 0.78 mg/dL). Liver Function Tests: No results for input(s): AST, ALT, ALKPHOS, BILITOT, PROT, ALBUMIN in the last 168 hours. No results for input(s): LIPASE, AMYLASE in the last 168 hours. No results for input(s): AMMONIA in the last 168 hours. Coagulation Profile: No results for input(s): INR, PROTIME in the last 168 hours. Cardiac Enzymes: Recent Labs  Lab 12/31/18 1008 12/31/18 1703  TROPONINI <0.03 <0.03   BNP (last 3 results) No results for input(s): PROBNP in the last 8760 hours. HbA1C: No results for input(s): HGBA1C in the last 72 hours. CBG: No results for input(s): GLUCAP in the last 168 hours. Lipid Profile: No results for input(s): CHOL, HDL, LDLCALC, TRIG, CHOLHDL, LDLDIRECT in the last 72 hours. Thyroid Function Tests: Recent Labs    12/31/18 1008  TSH 1.865   Anemia Panel: No results for input(s): VITAMINB12, FOLATE, FERRITIN, TIBC, IRON, RETICCTPCT in the last 72 hours. Urine analysis:    Component Value Date/Time   COLORURINE YELLOW 12/03/2009 2300   APPEARANCEUR CLEAR 12/03/2009 2300   LABSPEC 1.024 12/03/2009 2300   PHURINE 6.0 12/03/2009 2300   GLUCOSEU NEGATIVE 12/03/2009 2300   HGBUR NEGATIVE 12/03/2009 2300   BILIRUBINUR NEGATIVE 12/03/2009 2300   KETONESUR 15 (A) 12/03/2009 2300   PROTEINUR NEGATIVE 12/03/2009 2300   UROBILINOGEN 1.0 12/03/2009 2300   NITRITE NEGATIVE 12/03/2009 2300   LEUKOCYTESUR  12/03/2009 2300    NEGATIVE MICROSCOPIC NOT DONE ON URINES WITH NEGATIVE PROTEIN, BLOOD, LEUKOCYTES, NITRITE, OR GLUCOSE <1000 mg/dL.    Radiological Exams on Admission: Dg Chest 2 View  Result Date: 12/31/2018 CLINICAL DATA:  Chest pain and shortness of breath since yesterday. EXAM: CHEST - 2 VIEW COMPARISON:  10/23/2018 FINDINGS: Mild hyperinflation. Prior median sternotomy. Midline trachea. Mild cardiomegaly. Atherosclerosis in the transverse aorta. Trace bilateral pleural effusions. Mild biapical pleural thickening. Pulmonary  interstitial thickening is mild and new since the prior. No lobar consolidation. IMPRESSION: 1. Mild pulmonary venous congestion. 2. Trace bilateral pleural effusions. 3.  Aortic Atherosclerosis (ICD10-I70.0). Electronically Signed   By: Jeronimo Greaves M.D.   On: 12/31/2018 10:54   Ct Angio Chest Pe W Or Wo Contrast  Result Date: 12/31/2018 CLINICAL DATA:  Shortness of breath EXAM: CT ANGIOGRAPHY CHEST WITH CONTRAST TECHNIQUE: Multidetector CT imaging of the chest was performed using the standard protocol during bolus administration of intravenous contrast. Multiplanar CT image reconstructions and MIPs were obtained to evaluate the vascular anatomy. CONTRAST:  23mL OMNIPAQUE IOHEXOL 350 MG/ML SOLN COMPARISON:  None. FINDINGS: Cardiovascular: Satisfactory opacification of the pulmonary arteries to the segmental level. No evidence of pulmonary embolism. Normal heart size. Status median sternotomy and CABG. No pericardial effusion. There is irregular mural thrombus of the distal aortic arch (series 5, image 81, series 8, image 81). Mediastinum/Nodes: No enlarged mediastinal, hilar, or axillary lymph nodes. Thyroid gland, trachea, and esophagus demonstrate no significant findings. Lungs/Pleura: Mild centrilobular emphysema. Small bilateral pleural effusions and associated atelectasis or consolidation. Mild diffuse interlobular septal thickening. Upper Abdomen: No acute abnormality. Musculoskeletal: No chest wall abnormality. No acute or significant osseous findings. Review of the MIP images confirms the above findings. IMPRESSION: 1. Negative examination for pulmonary embolism. 2. 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 3. Small bilateral pleural effusions and mild interstitial pulmonary edema. 4. Emphysema. Emphysema (ICD10-J43.9). Electronically Signed   By: Lauralyn Primes M.D.   On: 12/31/2018 17:59    EKG: Independently reviewed.  Sinus rhythm without any acute changes  Assessment/Plan Principal  Problem:   Chest pain Active Problems:   Essential hypertension   Tobacco abuse   CAD (coronary artery disease)   Emphysema of lung (HCC)   Anxiety   OSA on CPAP   Chronic diastolic CHF (congestive heart failure) (HCC)     1. Chest pain.  Possibly related to CHF exacerbation.  We will cycle cardiac markers.  Continue on aspirin.  She has not had any ischemic testing lately.  Will consult with cardiology to see if stress testing is indicated.  Keep n.p.o. after midnight in case test can be done tomorrow.  CT Angio of the chest negative for pulmonary embolus. 2. Acute on chronic diastolic congestive heart failure.  Started on intravenous Lasix.  Monitor intake and output. 3. Emphysema.  No wheezing at this time.  Continue bronchodilators as needed. 4. Hypertension.  Blood pressures currently stable.  Continue outpatient regimen. 5. Anxiety.  Continue on home dose of Xanax. 6. Obstructive sleep apnea.  Continue CPAP. 7. Mural thrombus of aortic arch.  Incidental finding on CT scan of chest.  Reviewed with Dr. Chestine Spore on-call for vascular surgery.  Since she does not have any evidence of ongoing thromboembolic disease, recommendations are to treat with a daily aspirin.  Repeat imaging can be pursued if she does develop any symptoms from this.  DVT prophylaxis: Lovenox Code Status: Full code Family Communication: Discussed with husband at the bedside Disposition Plan: Discharge home once improved Consults called: Cardiology Admission status: Observation, telemetry  Erick Blinks MD [facility]   If 7PM-7AM, please contact night-coverage [email or web address]   12/31/2018, 8:14 PM

## 2018-12-31 NOTE — ED Provider Notes (Signed)
[facility] Emergency Department Provider Note MRN:  [id number]  Arrival date & time: 12/31/18     Chief Complaint   Chest Pain   History of Present Illness   [name] is a 53 y.o. year-old female with a history of carotid artery disease, bipolar disorder presenting to the ED with chief complaint of chest pain.  Gradual onset chest pain that occurred last night, center of the chest, with radiation to the right shoulder and midthoracic back.  Described as a dull pressure-like pain.  Denies dizziness, no sweatiness, no nausea, no vomiting.  Associated with trouble breathing.  Pain is 8 out of 10 currently, made worse with laying flat.  Does not remember what her prior heart attacks felt like.  Review of Systems  A complete 10 system review of systems was obtained and all systems are negative except as noted in the HPI and PMH.   Patient's Health History    Past Medical History:  Diagnosis Date  . ADD (attention deficit disorder with hyperactivity)   . Anxiety   . Bipolar 1 disorder (HCC)   . Carotid artery disease (HCC)    Right 50-69% stenosis by doppler 2010  . CHF (congestive heart failure) (HCC)   . Coronary artery disease    s/p 3V CABG '09  . CVA (cerebral infarction) 12/2008   Reports a neurologist told her she had a blood clot in her brain  . Depression   . Emphysema of lung (HCC) 06/19/2018  . Fibromyalgia   . GERD (gastroesophageal reflux disease)   . Hyperlipidemia   . Hypertension   . OSA on CPAP 12/31/2018  . Tobacco abuse     Past Surgical History:  Procedure Laterality Date  . CARDIAC CATHETERIZATION    . CORONARY ARTERY BYPASS GRAFT  2009   3V CABG LIMA --> LAD, SVG --> Ramus Intermediate,  SVG --> RCA  . TUBAL LIGATION  1990    Family History  Problem Relation Age of Onset  . Intellectual disability Maternal Aunt   . Intellectual disability Maternal Uncle   . Alcohol abuse Paternal Grandfather     Social History    Socioeconomic History  . Marital status: Married    Spouse name: Not on file  . Number of children: Not on file  . Years of education: Not on file  . Highest education level: Not on file  Occupational History  . Occupation: Unemployed    Comment: Applicatin for disability pending, inadequate funds to afford medical care  Social Needs  . Financial resource strain: Not on file  . Food insecurity:    Worry: Not on file    Inability: Not on file  . Transportation needs:    Medical: Not on file    Non-medical: Not on file  Tobacco Use  . Smoking status: Former Smoker    Packs/day: 0.50    Years: 35.00    Pack years: 17.50    Types: Cigarettes    Start date: 12/14/1981    Last attempt to quit: 12/17/2018    Years since quitting: 0.0  . Smokeless tobacco: Never Used  Substance and Sexual Activity  . Alcohol use: Not Currently  . Drug use: No  . Sexual activity: Yes  Lifestyle  . Physical activity:    Days per week: Not on file    Minutes per session: Not on file  . Stress: Not on file  Relationships  . Social connections:    Talks on phone:  Not on file    Gets together: Not on file    Attends religious service: Not on file    Active member of club or organization: Not on file    Attends meetings of clubs or organizations: Not on file    Relationship status: Not on file  . Intimate partner violence:    Fear of current or ex partner: Not on file    Emotionally abused: Not on file    Physically abused: Not on file    Forced sexual activity: Not on file  Other Topics Concern  . Not on file  Social History Narrative   Divorced with 2 children     Physical Exam  Vital Signs and Nursing Notes reviewed Vitals:   12/31/18 1300 12/31/18 1330  BP: (!) 158/58 (!) 152/61  Pulse:    Resp: 18 19  Temp:    SpO2: 95% 95%    CONSTITUTIONAL: Well-appearing, NAD NEURO:  Alert and oriented x 3, no focal deficits EYES:  eyes equal and reactive ENT/NECK:  no LAD, no  JVD CARDIO: Bradycardic and irregular rate, well-perfused, normal S1 and S2 PULM:  CTAB no wheezing or rhonchi GI/GU:  normal bowel sounds, non-distended, non-tender MSK/SPINE:  No gross deformities, no edema SKIN:  no rash, atraumatic PSYCH:  Appropriate speech and behavior  Diagnostic and Interventional Summary    EKG Interpretation  Date/Time:  Sunday December 31 2018 09:54:37 EDT Ventricular Rate:  51 PR Interval:    QRS Duration: 105 QT Interval:  519 QTC Calculation: 478 R Axis:   86 Text Interpretation:  Sinus rhythm Multiple ventricular premature complexes Borderline repolarization abnormality Confirmed by Kennis Carina [phone]) on 12/31/2018 10:55:14 AM      Labs Reviewed  BASIC METABOLIC PANEL - Abnormal; Notable for the following components:      Result Value   Glucose, Bld 100 (*)    Calcium 8.8 (*)    All other components within normal limits  CBC - Abnormal; Notable for the following components:   RBC 3.68 (*)    Hemoglobin 10.7 (*)    HCT 34.5 (*)    All other components within normal limits  BRAIN NATRIURETIC PEPTIDE - Abnormal; Notable for the following components:   B Natriuretic Peptide 303.0 (*)    All other components within normal limits  D-DIMER, QUANTITATIVE (NOT AT [facility]) - Abnormal; Notable for the following components:   D-Dimer, Quant 0.70 (*)    All other components within normal limits  TROPONIN I    DG Chest 2 View  Final Result      Medications  oxyCODONE (Oxy IR/ROXICODONE) immediate release tablet 5 mg (5 mg Oral Given 12/31/18 1015)  aspirin chewable tablet 324 mg (324 mg Oral Given 12/31/18 1015)  nitroGLYCERIN (NITROSTAT) SL tablet 0.4 mg (0.4 mg Sublingual Given 12/31/18 1016)  acetaminophen (TYLENOL) tablet 1,000 mg (1,000 mg Oral Given 12/31/18 1209)  alum & mag hydroxide-simeth (MAALOX/MYLANTA) 200-200-20 MG/5ML suspension 30 mL (30 mLs Oral Given 12/31/18 1208)    And  lidocaine (XYLOCAINE) 2 % viscous mouth solution 15 mL (15 mLs Oral  Given 12/31/18 1208)     Procedures Critical Care  ED Course and Medical Decision Making  I have reviewed the triage vital signs and the nursing notes.  Pertinent labs & imaging results that were available during my care of the patient were reviewed by me and considered in my medical decision making (see below for details).  Concern for ACS given patient's history of CAD,  has not had ischemic evaluation of the heart in several years.  EKG is without acute ischemic concerns, troponin pending.  Troponin negative.  Labs and chest x-ray consistent with very mild pulmonary congestion.  Admitted to hospital service for chest pain rule out.  Elmer Sow. Pilar Plate, MD [facility] Emergency Medicine [facility] [email or web address]  Final Clinical Impressions(s) / ED Diagnoses     ICD-10-CM   1. Chest pain R07.9 CANCELED: DG Chest 2 View    CANCELED: DG Chest 2 View    ED Discharge Orders    None         Sabas Sous, MD 12/31/18 [phone]

## 2018-12-31 NOTE — ED Triage Notes (Signed)
Patient c/o left side chest pain that started last night. Patient states pain radiates into back. Per patient short of breath and generalized weakness. Patient has cardiac hx with MI and open-heart surgery in 2009. Patient also has a low heart rate (40s) for a few weeks per aunt in which she was seen by cardiologist and PCP, placed on "a vitamin."

## 2018-12-31 NOTE — ED Notes (Signed)
Meal provided 

## 2019-01-01 ENCOUNTER — Observation Stay (HOSPITAL_BASED_OUTPATIENT_CLINIC_OR_DEPARTMENT_OTHER): Payer: Managed Care, Other (non HMO)

## 2019-01-01 DIAGNOSIS — I110 Hypertensive heart disease with heart failure: Secondary | ICD-10-CM | POA: Diagnosis not present

## 2019-01-01 DIAGNOSIS — I5032 Chronic diastolic (congestive) heart failure: Secondary | ICD-10-CM | POA: Diagnosis not present

## 2019-01-01 DIAGNOSIS — I5031 Acute diastolic (congestive) heart failure: Secondary | ICD-10-CM | POA: Diagnosis not present

## 2019-01-01 DIAGNOSIS — R001 Bradycardia, unspecified: Secondary | ICD-10-CM

## 2019-01-01 DIAGNOSIS — I25708 Atherosclerosis of coronary artery bypass graft(s), unspecified, with other forms of angina pectoris: Secondary | ICD-10-CM

## 2019-01-01 DIAGNOSIS — I10 Essential (primary) hypertension: Secondary | ICD-10-CM | POA: Diagnosis not present

## 2019-01-01 DIAGNOSIS — R079 Chest pain, unspecified: Secondary | ICD-10-CM | POA: Diagnosis not present

## 2019-01-01 DIAGNOSIS — J439 Emphysema, unspecified: Secondary | ICD-10-CM | POA: Diagnosis not present

## 2019-01-01 LAB — ECHOCARDIOGRAM COMPLETE
Height: 62 in
Weight: 2472.68 oz

## 2019-01-01 LAB — BASIC METABOLIC PANEL
Anion gap: 10 (ref 5–15)
BUN: 15 mg/dL (ref 6–20)
CHLORIDE: 103 mmol/L (ref 98–111)
CO2: 26 mmol/L (ref 22–32)
Calcium: 9.2 mg/dL (ref 8.9–10.3)
Creatinine, Ser: 0.84 mg/dL (ref 0.44–1.00)
GFR calc Af Amer: >60 mL/min (ref >60)
GFR calc non Af Amer: >60 mL/min (ref >60)
Glucose, Bld: 115 mg/dL — ABNORMAL HIGH (ref 70–99)
Potassium: 3.7 mmol/L (ref 3.5–5.1)
Sodium: 139 mmol/L (ref 135–145)

## 2019-01-01 LAB — TROPONIN I: Troponin I: <0.03 ng/mL (ref <0.03)

## 2019-01-01 NOTE — Progress Notes (Signed)
PROGRESS NOTE    [name]  FSF:[id number] DOB: 03/17/1966 DOA: 12/31/2018 PCP: Samuel Jester, DO    Brief Narrative:  53 year old female with a history of coronary artery disease and coronary artery bypass graft in the past, admitted to the hospital with chest pain and shortness of breath.  She has a history of diastolic heart failure and was noted to be in decompensated CHF.  She was admitted for further work-up of chest pain as well as diuresis for CHF.   Assessment & Plan:   Principal Problem:   Chest pain Active Problems:   Essential hypertension   Tobacco abuse   CAD (coronary artery disease)   Emphysema of lung (HCC)   Anxiety   OSA on CPAP   Chronic diastolic CHF (congestive heart failure) (HCC)   1. Chest pain.  Atypical.  She is ruled out for ACS with negative cardiac markers.  No acute changes on EKG.  Seen by cardiology and she will be considered for outpatient nuclear stress test. 2. Acute on chronic diastolic congestive heart failure.  On intravenous Lasix.  She has been having good diuresis.  She still has some evidence of volume overload.  Continue IV Lasix for another 24 hours. 3. Emphysema.  No wheezing at this time.  Continue bronchodilators as needed. 4. Hypertension.  Blood pressure currently stable.  Continue outpatient regimen. 5. Anxiety.  Continue home dose of Xanax. 6. Obstructive sleep apnea.  Continue CPAP. 7. Mural thrombus of distal aortic arch.  Incidental finding on CT scan of chest.  Reviewed with vascular surgery.  Since there is no evidence of ongoing thromboembolic disease, recommendations are to treat with daily aspirin.   DVT prophylaxis: Lovenox Code Status: Full code Family Communication: No family present Disposition Plan: Discharge home once adequately diuresed, possibly in a.m.   Consultants:   Cardiology  Procedures:  Echo:1. The left ventricle has normal systolic function with an ejection fraction of 60-65%. The cavity  size was normal. Left ventricular diastolic parameters were normal No evidence of left ventricular regional wall motion abnormalities.  2. The mitral valve is normal in structure.  3. The tricuspid valve is normal in structure.  4. The aortic valve is tricuspid.  5. The aortic root is normal in size and structure.   6. No evidence of left ventricular regional wall motion abnormalities.  Antimicrobials:      Subjective: She is still short of breath on ambulation.  Still reports having some chest pressure.  Objective: Vitals:   01/01/19 0502 01/01/19 0819 01/01/19 1007 01/01/19 1500  BP: (!) 118/50  (!) 108/55 (!) 106/50  Pulse: (!) 57  (!) 54 (!) 57  Resp: 19  18 16   Temp: 97.7 F (36.5 C)     TempSrc: Oral     SpO2: 95% 95%  96%  Weight:      Height:        Intake/Output Summary (Last 24 hours) at 01/01/2019 1854 Last data filed at 01/01/2019 1832 Gross per 24 hour  Intake 1200 ml  Output 4400 ml  Net -3200 ml   Filed Weights   12/31/18 0937 12/31/18 1643  Weight: 67.6 kg 70.1 kg    Examination:  General exam: Appears calm and comfortable  Respiratory system: Crackles at bases. Respiratory effort normal. Cardiovascular system: S1 & S2 heard, RRR. No JVD, murmurs, rubs, gallops or clicks. No pedal edema. Gastrointestinal system: Abdomen is nondistended, soft and nontender. No organomegaly or masses felt. Normal bowel sounds heard. Central  nervous system: Alert and oriented. No focal neurological deficits. Extremities: Symmetric 5 x 5 power. Skin: No rashes, lesions or ulcers Psychiatry: Judgement and insight appear normal. Mood & affect appropriate.     Data Reviewed: I have personally reviewed following labs and imaging studies  CBC: Recent Labs  Lab 12/31/18 1008  WBC 8.4  HGB 10.7*  HCT 34.5*  MCV 93.8  PLT 303   Basic Metabolic Panel: Recent Labs  Lab 12/31/18 1008 01/01/19 0452  NA 139 139  K 3.7 3.7  CL 107 103  CO2 23 26  GLUCOSE 100* 115*   BUN 12 15  CREATININE 0.78 0.84  CALCIUM 8.8* 9.2   GFR: Estimated Creatinine Clearance: 71 mL/min (by C-G formula based on SCr of 0.84 mg/dL). Liver Function Tests: No results for input(s): AST, ALT, ALKPHOS, BILITOT, PROT, ALBUMIN in the last 168 hours. No results for input(s): LIPASE, AMYLASE in the last 168 hours. No results for input(s): AMMONIA in the last 168 hours. Coagulation Profile: No results for input(s): INR, PROTIME in the last 168 hours. Cardiac Enzymes: Recent Labs  Lab 12/31/18 1008 12/31/18 1703 12/31/18 2212 01/01/19 0452  TROPONINI <0.03 <0.03 <0.03 <0.03   BNP (last 3 results) No results for input(s): PROBNP in the last 8760 hours. HbA1C: No results for input(s): HGBA1C in the last 72 hours. CBG: No results for input(s): GLUCAP in the last 168 hours. Lipid Profile: No results for input(s): CHOL, HDL, LDLCALC, TRIG, CHOLHDL, LDLDIRECT in the last 72 hours. Thyroid Function Tests: Recent Labs    12/31/18 1008  TSH 1.865   Anemia Panel: No results for input(s): VITAMINB12, FOLATE, FERRITIN, TIBC, IRON, RETICCTPCT in the last 72 hours. Sepsis Labs: No results for input(s): PROCALCITON, LATICACIDVEN in the last 168 hours.  No results found for this or any previous visit (from the past 240 hour(s)).       Radiology Studies: Dg Chest 2 View  Result Date: 12/31/2018 CLINICAL DATA:  Chest pain and shortness of breath since yesterday. EXAM: CHEST - 2 VIEW COMPARISON:  10/23/2018 FINDINGS: Mild hyperinflation. Prior median sternotomy. Midline trachea. Mild cardiomegaly. Atherosclerosis in the transverse aorta. Trace bilateral pleural effusions. Mild biapical pleural thickening. Pulmonary interstitial thickening is mild and new since the prior. No lobar consolidation. IMPRESSION: 1. Mild pulmonary venous congestion. 2. Trace bilateral pleural effusions. 3.  Aortic Atherosclerosis (ICD10-I70.0). Electronically Signed   By: Jeronimo Greaves M.D.   On: 12/31/2018  10:54   Ct Angio Chest Pe W Or Wo Contrast  Result Date: 12/31/2018 CLINICAL DATA:  Shortness of breath EXAM: CT ANGIOGRAPHY CHEST WITH CONTRAST TECHNIQUE: Multidetector CT imaging of the chest was performed using the standard protocol during bolus administration of intravenous contrast. Multiplanar CT image reconstructions and MIPs were obtained to evaluate the vascular anatomy. CONTRAST:  14mL OMNIPAQUE IOHEXOL 350 MG/ML SOLN COMPARISON:  None. FINDINGS: Cardiovascular: Satisfactory opacification of the pulmonary arteries to the segmental level. No evidence of pulmonary embolism. Normal heart size. Status median sternotomy and CABG. No pericardial effusion. There is irregular mural thrombus of the distal aortic arch (series 5, image 81, series 8, image 81). Mediastinum/Nodes: No enlarged mediastinal, hilar, or axillary lymph nodes. Thyroid gland, trachea, and esophagus demonstrate no significant findings. Lungs/Pleura: Mild centrilobular emphysema. Small bilateral pleural effusions and associated atelectasis or consolidation. Mild diffuse interlobular septal thickening. Upper Abdomen: No acute abnormality. Musculoskeletal: No chest wall abnormality. No acute or significant osseous findings. Review of the MIP images confirms the above findings. IMPRESSION: 1. Negative  examination for pulmonary embolism. 2. 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 3. Small bilateral pleural effusions and mild interstitial pulmonary edema. 4. Emphysema. Emphysema (ICD10-J43.9). Electronically Signed   By: Lauralyn Primes M.D.   On: 12/31/2018 17:59        Scheduled Meds: . amLODipine  10 mg Oral Daily  . ARIPiprazole  10 mg Oral Daily  . aspirin EC  81 mg Oral Daily  . busPIRone  15 mg Oral TID  . enoxaparin (LOVENOX) injection  40 mg Subcutaneous Q24H  . FLUoxetine  80 mg Oral Daily  . furosemide  40 mg Intravenous BID  . gabapentin  300 mg Oral QID  . mometasone-formoterol  2 puff Inhalation BID  .  pantoprazole  40 mg Oral Daily  . potassium chloride  20 mEq Oral BID  . prazosin  3 mg Oral QHS  . ranolazine  500 mg Oral BID  . rosuvastatin  5 mg Oral Daily  . topiramate  50 mg Oral Daily   Continuous Infusions:   LOS: 0 days    Time spent:    Erick Blinks, MD [facility]   If 7PM-7AM, please contact night-coverage [email or web address]  01/01/2019, 6:54 PM

## 2019-01-01 NOTE — Progress Notes (Signed)
Pt states she does not want to wear CPAP machine tonight. Machine at bedside if patient changes her mind

## 2019-01-01 NOTE — Consult Note (Addendum)
Cardiology Consultation:   Patient ID: [name] MRN: [id number]; DOB: 1966/10/20  Admit date: 12/31/2018 Date of Consult: 01/01/2019  Primary Care Provider: Samuel JesterButler, Cynthia, DO Primary Cardiologist: Prentice DockerSuresh Jceon Alverio, MD  Primary Electrophysiologist:  None    Patient Profile:   [name] is a 53 y.o. female with a hx of CAD who is being seen today for the evaluation of chest pain ans SOB at the request of Dr Kerry HoughMemon.  History of Present Illness:   [name] is a pleasant 53 year old female with a history of prior CABG x3 in 2009.  Nuclear stress in January 2017 was low risk.  Echocardiogram January 2019 showed an ejection fraction of 60 to 65%.  Left ventricular diastolic parameters were normal.  She does have multiple other medical issues including COPD, hypertension, sleep apnea not on CPAP, dyslipidemia, and bipolar disorder.   She was just seen in the office by Dr. Josue Hectoronus Warren on December 06, 2018.  She has baseline bradycardia so she is not on a beta-blocker.  She complained of fatigue.  Dr. Ileene HutchinsonKona sworn felt some of her symptoms were secondary to sleep apnea but unfortunately she was unable to afford CPAP.  She then presented to the emergency room 12/31/2018 with complaints of chest tightness and shortness of breath.  From her history it sounds like her symptoms started somewhat abruptly.  When she got to the emergency room she was given nitroglycerin with some relief.  Her troponins have been negative.  Her BNP was slightly above her baseline, 303.  Chest x-ray suggested vascular congestion.  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   Past Medical History:  Diagnosis Date  . ADD (attention deficit disorder with hyperactivity)   . Anxiety   . Bipolar 1 disorder (HCC)   . Carotid artery disease (HCC)    Right 50-69% stenosis by doppler 2010  . CHF (congestive heart failure) (HCC)   . Coronary artery disease    s/p 3V CABG '09  . CVA  (cerebral infarction) 12/2008   Reports a neurologist told her she had a blood clot in her brain  . Depression   . Emphysema of lung (HCC) 06/19/2018  . Fibromyalgia   . GERD (gastroesophageal reflux disease)   . Hyperlipidemia   . Hypertension   . OSA on CPAP 12/31/2018  . Tobacco abuse     Past Surgical History:  Procedure Laterality Date  . CARDIAC CATHETERIZATION    . CORONARY ARTERY BYPASS GRAFT  2009   3V CABG LIMA --> LAD, SVG --> Ramus Intermediate,  SVG --> RCA  . TUBAL LIGATION  1990     Home Medications:  Prior to Admission medications   Medication Sig Start Date End Date Taking? Authorizing Provider  ALPRAZolam Prudy Feeler(XANAX) 0.5 MG tablet Take 0.5 mg by mouth 3 (three) times daily as needed. For anxiety  ** [employer] [facility]  90 DAY SUPPLY**   Yes [provider]  amLODipine (NORVASC) 10 MG tablet Take 1 tablet (10 mg total) by mouth daily. 09/05/18  Yes Laqueta LindenKoneswaran, Makelle Marrone A, MD  ARIPiprazole (ABILIFY) 10 MG tablet Take 1 tablet (10 mg total) by mouth daily. 09/20/18  Yes Neysa HotterHisada, Reina, MD  aspirin EC 81 MG tablet Take 81 mg by mouth daily.   Yes [provider]  benzonatate (TESSALON) 100 MG capsule Take 1 capsule (100 mg total) by mouth 3 (three) times daily as needed for cough. 10/23/18  Yes Loren RacerYelverton, David, MD  budesonide-formoterol (SYMBICORT) 160-4.5 MCG/ACT inhaler Inhale 2 puffs into the lungs 2 (two) times daily.   Yes [provider]  busPIRone (BUSPAR) 15 MG tablet Take 1 tablet (15 mg total) by mouth 3 (three) times daily. 09/20/18  Yes Neysa Hotter, MD  cyclobenzaprine (FLEXERIL) 10 MG tablet Take 10 mg by mouth 2 (two) times daily as needed for muscle spasms.    Yes [provider]  diphenoxylate-atropine (LOMOTIL) 2.5-0.025 MG tablet Take 2.5 mg by mouth daily as needed.    Yes [provider]  esomeprazole (NEXIUM) 40 MG capsule Take 40 mg by mouth every morning.   Yes [provider]  FLUoxetine (PROZAC) 40 MG  capsule Take 2 capsules (80 mg total) by mouth daily. 09/20/18  Yes Neysa Hotter, MD  furosemide (LASIX) 20 MG tablet Take 1 tablet (20 mg total) by mouth 2 (two) times daily. 12/06/18  Yes Laqueta Linden, MD  gabapentin (NEURONTIN) 300 MG capsule Take 1 capsule by mouth 4 (four) times daily. 11/20/18  Yes [provider]  nitroGLYCERIN (NITROSTAT) 0.4 MG SL tablet Place 1 tablet (0.4 mg total) under the tongue every 5 (five) minutes x 3 doses as needed for chest pain (if no relief after 3rd dose, proceed to the ED for an evaluation). 11/21/17  Yes Laqueta Linden, MD  potassium chloride (KLOR-CON) 20 MEQ packet Take 20 mEq by mouth 2 (two) times daily. 12/06/18  Yes Laqueta Linden, MD  prazosin (MINIPRESS) 1 MG capsule Take 3 capsules (3 mg total) by mouth at bedtime. Patient taking differently: Take 1-3 mg by mouth at bedtime.  09/20/18  Yes Neysa Hotter, MD  PROAIR HFA 108 [phone] Base) MCG/ACT inhaler Inhale 2 puffs into the lungs every 6 (six) hours as needed for wheezing or shortness of breath.  08/22/15  Yes [provider]  ranolazine (RANEXA) 500 MG 12 hr tablet Take 1 tablet (500 mg total) by mouth 2 (two) times daily. 07/07/18  Yes Laqueta Linden, MD  rOPINIRole (REQUIP) 1 MG tablet Take 1 mg by mouth at bedtime as needed (for restless leg).    Yes [provider]  rosuvastatin (CRESTOR) 5 MG tablet Take 1 tablet (5 mg total) by mouth daily. 03/23/16  Yes Laqueta Linden, MD  topiramate (TOPAMAX) 50 MG tablet Take 50 mg by mouth daily.    Yes [provider]  Vitamin D, Ergocalciferol, (DRISDOL) 1.25 MG (50000 UT) CAPS capsule Take 50,000 Units by mouth once a week. 10/23/18  Yes [provider]  zolpidem (AMBIEN) 10 MG tablet Take 1 tablet by mouth at bedtime as needed. 11/23/18  Yes [provider]    Inpatient Medications: Scheduled Meds: . amLODipine  10 mg Oral Daily  . ARIPiprazole  10 mg Oral Daily  .  aspirin EC  81 mg Oral Daily  . busPIRone  15 mg Oral TID  . enoxaparin (LOVENOX) injection  40 mg Subcutaneous Q24H  . FLUoxetine  80 mg Oral Daily  . furosemide  40 mg Intravenous BID  . gabapentin  300 mg Oral QID  . mometasone-formoterol  2 puff Inhalation BID  . pantoprazole  40 mg Oral Daily  . potassium chloride  20 mEq Oral BID  . prazosin  3 mg Oral QHS  . ranolazine  500 mg Oral BID  . rosuvastatin  5 mg Oral Daily  . topiramate  50 mg Oral Daily   Continuous Infusions:  PRN Meds: acetaminophen, ALPRAZolam, cyclobenzaprine, ipratropium-albuterol, nitroGLYCERIN, ondansetron (  ZOFRAN) IV, rOPINIRole, zolpidem  Allergies:   No Known Allergies  Social History:   Social History   Socioeconomic History  . Marital status: Married    Spouse name: Not on file  . Number of children: Not on file  . Years of education: Not on file  . Highest education level: Not on file  Occupational History  . Occupation: Unemployed    Comment: Applicatin for disability pending, inadequate funds to afford medical care  Social Needs  . Financial resource strain: Not on file  . Food insecurity:    Worry: Not on file    Inability: Not on file  . Transportation needs:    Medical: Not on file    Non-medical: Not on file  Tobacco Use  . Smoking status: Former Smoker    Packs/day: 0.50    Years: 35.00    Pack years: 17.50    Types: Cigarettes    Start date: 12/14/1981    Last attempt to quit: 12/17/2018    Years since quitting: 0.0  . Smokeless tobacco: Never Used  Substance and Sexual Activity  . Alcohol use: Not Currently  . Drug use: No  . Sexual activity: Yes  Lifestyle  . Physical activity:    Days per week: Not on file    Minutes per session: Not on file  . Stress: Not on file  Relationships  . Social connections:    Talks on phone: Not on file    Gets together: Not on file    Attends religious service: Not on file    Active member of club or organization: Not on file     Attends meetings of clubs or organizations: Not on file    Relationship status: Not on file  . Intimate partner violence:    Fear of current or ex partner: Not on file    Emotionally abused: Not on file    Physically abused: Not on file    Forced sexual activity: Not on file  Other Topics Concern  . Not on file  Social History Narrative   Divorced with 2 children    Family History:    Family History  Problem Relation Age of Onset  . Intellectual disability Maternal Aunt   . Intellectual disability Maternal Uncle   . Alcohol abuse Paternal Grandfather      ROS:  Please see the history of present illness.  All other ROS reviewed and negative.     Physical Exam/Data:   Vitals:   12/31/18 2000 12/31/18 2136 01/01/19 0502 01/01/19 0819  BP: (!) 117/53  (!) 118/50   Pulse: (!) 51  (!) 57   Resp: 20  19   Temp: 98.2 F (36.8 C)  97.7 F (36.5 C)   TempSrc: Oral  Oral   SpO2: 95% 94% 95% 95%  Weight:      Height:        Intake/Output Summary (Last 24 hours) at 01/01/2019 0908 Last data filed at 12/31/2018 2028 Gross per 24 hour  Intake 240 ml  Output 1000 ml  Net -760 ml   Last 3 Weights 12/31/2018 12/31/2018 12/06/2018  Weight (lbs) 154 lb 8.7 oz 149 lb 148 lb 3.2 oz  Weight (kg) 70.1 kg 67.586 kg 67.223 kg  Some encounter information is confidential and restricted. Go to Review Flowsheets activity to see all data.     Body mass index is 28.27 kg/m.  General:  Well nourished, well developed, in no acute distress HEENT: normal Lymph: no  adenopathy Neck: no JVD Endocrine:  No thryomegaly Vascular: No carotid bruits; FA pulses 2+ bilaterally without bruits  Cardiac:  normal S1, S2; RRR; no murmur  Lungs:  clear to auscultation bilaterally, no wheezing, rhonchi or rales  Abd: soft, nontender, no hepatomegaly  Ext: no edema Musculoskeletal:  No deformities, BUE and BLE strength normal and equal Skin: warm and dry  Neuro:  CNs 2-12 intact, no focal abnormalities  noted Psych:  Normal affect   EKG:  The EKG was personally reviewed and demonstrates:  NSR-51, PVCs Telemetry:  Telemetry was personally reviewed and demonstrates:  NSR-PVCs  Relevant CV Studies: Echo 11/23/2017- Study Conclusions  - Left ventricle: The cavity size was normal. Wall thickness was   normal. Systolic function was normal. The estimated ejection   fraction was in the range of 60% to 65%. Wall motion was normal;   there were no regional wall motion abnormalities. Left   ventricular diastolic function parameters were normal.    Laboratory Data:  Chemistry Recent Labs  Lab 12/31/18 1008 01/01/19 0452  NA 139 139  K 3.7 3.7  CL 107 103  CO2 23 26  GLUCOSE 100* 115*  BUN 12 15  CREATININE 0.78 0.84  CALCIUM 8.8* 9.2  GFRNONAA >60 >60  GFRAA >60 >60  ANIONGAP 9 10    No results for input(s): PROT, ALBUMIN, AST, ALT, ALKPHOS, BILITOT in the last 168 hours. Hematology Recent Labs  Lab 12/31/18 1008  WBC 8.4  RBC 3.68*  HGB 10.7*  HCT 34.5*  MCV 93.8  MCH 29.1  MCHC 31.0  RDW 13.2  PLT 303   Cardiac Enzymes Recent Labs  Lab 12/31/18 1008 12/31/18 1703 12/31/18 2212 01/01/19 0452  TROPONINI <0.03 <0.03 <0.03 <0.03   No results for input(s): TROPIPOC in the last 168 hours.  BNP Recent Labs  Lab 12/31/18 1008  BNP 303.0*    DDimer  Recent Labs  Lab 12/31/18 1008  DDIMER 0.70*    Radiology/Studies:  Dg Chest 2 View  Result Date: 12/31/2018 CLINICAL DATA:  Chest pain and shortness of breath since yesterday. EXAM: CHEST - 2 VIEW COMPARISON:  10/23/2018 FINDINGS: Mild hyperinflation. Prior median sternotomy. Midline trachea. Mild cardiomegaly. Atherosclerosis in the transverse aorta. Trace bilateral pleural effusions. Mild biapical pleural thickening. Pulmonary interstitial thickening is mild and new since the prior. No lobar consolidation. IMPRESSION: 1. Mild pulmonary venous congestion. 2. Trace bilateral pleural effusions. 3.  Aortic  Atherosclerosis (ICD10-I70.0). Electronically Signed   By: Jeronimo Greaves M.D.   On: 12/31/2018 10:54   Ct Angio Chest Pe W Or Wo Contrast  Result Date: 12/31/2018 CLINICAL DATA:  Shortness of breath EXAM: CT ANGIOGRAPHY CHEST WITH CONTRAST TECHNIQUE: Multidetector CT imaging of the chest was performed using the standard protocol during bolus administration of intravenous contrast. Multiplanar CT image reconstructions and MIPs were obtained to evaluate the vascular anatomy. CONTRAST:  75mL OMNIPAQUE IOHEXOL 350 MG/ML SOLN COMPARISON:  None. FINDINGS: Cardiovascular: Satisfactory opacification of the pulmonary arteries to the segmental level. No evidence of pulmonary embolism. Normal heart size. Status median sternotomy and CABG. No pericardial effusion. There is irregular mural thrombus of the distal aortic arch (series 5, image 81, series 8, image 81). Mediastinum/Nodes: No enlarged mediastinal, hilar, or axillary lymph nodes. Thyroid gland, trachea, and esophagus demonstrate no significant findings. Lungs/Pleura: Mild centrilobular emphysema. Small bilateral pleural effusions and associated atelectasis or consolidation. Mild diffuse interlobular septal thickening. Upper Abdomen: No acute abnormality. Musculoskeletal: No chest wall abnormality. No acute or significant  osseous findings. Review of the MIP images confirms the above findings. IMPRESSION: 1. Negative examination for pulmonary embolism. 2. 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 3. Small bilateral pleural effusions and mild interstitial pulmonary edema. 4. Emphysema. Emphysema (ICD10-J43.9). Electronically Signed   By: Lauralyn Primes M.D.   On: 12/31/2018 17:59    Assessment and Plan:   Chest pain and SOB- Her symptoms did not really sound like angina.  She may have acute on chronic diastolic CHF.  CAD-  History of CABG x3 in 2009.  Nuclear study in 2017 was low risk, we may want to consider repeating the study.  HTN- her blood  pressure was elevated in the first 24 hours.  Her heart rate was also in the 40s on admission.  Both her blood pressure and heart rate are under better control as of this morning.  COPD- She is followed by Dr. Juanetta Gosling.  She had no wheezing on exam today.   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 Chestine Spore who was on-call for vascular surgery.  It was felt she did not have any evidence of ongoing thromboembolic disease in the recommended treatment was daily aspirin.  Repeat imaging could be pursued if she develops any symptoms.  Plan- Dr Purvis Sheffield to see. She is currently on Lasix 40 mg IV BID. Consider OP Nuclear stress once stable.      For questions or updates, please contact [facility] Please consult [email or web address] for contact info under     Signed, Corine Shelter, PA-C  01/01/2019 9:08 AM   The patient was seen and examined, and I agree with the history, physical exam, assessment and plan as documented above, with modifications as noted below. I have also personally reviewed all relevant documentation, old records, labs, and both radiographic and cardiovascular studies. I have also independently interpreted old and new ECG's.  Mrs. Crader is well-known to me and I most recently saw her on 12/06/2018.  She has a history of coronary artery disease with three-vessel CABG (LIMA to LAD, SVG to ramus intermedius, SVG to RCA)  in 2009 as well as CVA and panic disorder.  She underwent a low risk nuclear stress test on 11/24/2015. Echocardiogramon 11/23/2017 was normal, LVEF 60 to 65%.  She started CPAP for sleep apnea about 2 weeks ago.  Her aunt, French Ana, was present at the time of my evaluation.  She had been shopping with her aunt this past Saturday.  She had some shortness of breath while walking.  Later that night she was unable to lie down flat.  She denied chest pain per se but had some tightness.  Troponins here have been normal.  Chest x-ray showed only  mild vascular congestion.  CT angiography of the chest showed distal aortic arch thrombus.  There was small bilateral pleural effusions and mild interstitial edema.  She is currently on IV Lasix 40 mg twice daily.  She has put out 760 cc in the past 24 hours.  Heart rate initially in the 40 bpm range but currently in the high 50 bpm range.  She is asymptomatic with respect to this.  I will order a 2-D echocardiogram with Doppler to evaluate cardiac structure, function, and regional wall motion.  I will arrange for outpatient nuclear stress test to assess for an ischemic etiology of her heart failure.  Given her bradycardia, I wonder about the SVG to RCA distribution.   Prentice Docker, MD, [facility]  01/01/2019 9:44 AM

## 2019-01-01 NOTE — Progress Notes (Signed)
*  PRELIMINARY RESULTS* Echocardiogram 2D Echocardiogram has been performed.  Stacey Drain 01/01/2019, 11:37 AM

## 2019-01-02 ENCOUNTER — Telehealth: Payer: Self-pay | Admitting: *Deleted

## 2019-01-02 DIAGNOSIS — R079 Chest pain, unspecified: Secondary | ICD-10-CM

## 2019-01-02 DIAGNOSIS — I7410 Embolism and thrombosis of unspecified parts of aorta: Secondary | ICD-10-CM

## 2019-01-02 DIAGNOSIS — I493 Ventricular premature depolarization: Secondary | ICD-10-CM

## 2019-01-02 DIAGNOSIS — I5031 Acute diastolic (congestive) heart failure: Secondary | ICD-10-CM | POA: Diagnosis not present

## 2019-01-02 DIAGNOSIS — F419 Anxiety disorder, unspecified: Secondary | ICD-10-CM | POA: Diagnosis not present

## 2019-01-02 DIAGNOSIS — J439 Emphysema, unspecified: Secondary | ICD-10-CM | POA: Diagnosis not present

## 2019-01-02 DIAGNOSIS — I5032 Chronic diastolic (congestive) heart failure: Secondary | ICD-10-CM | POA: Diagnosis not present

## 2019-01-02 DIAGNOSIS — I25708 Atherosclerosis of coronary artery bypass graft(s), unspecified, with other forms of angina pectoris: Secondary | ICD-10-CM | POA: Diagnosis not present

## 2019-01-02 LAB — BASIC METABOLIC PANEL
Anion gap: 9 (ref 5–15)
BUN: 21 mg/dL — ABNORMAL HIGH (ref 6–20)
CO2: 26 mmol/L (ref 22–32)
Calcium: 9.8 mg/dL (ref 8.9–10.3)
Chloride: 104 mmol/L (ref 98–111)
Creatinine, Ser: 1.05 mg/dL — ABNORMAL HIGH (ref 0.44–1.00)
GFR calc non Af Amer: >60 mL/min (ref >60)
Glucose, Bld: 114 mg/dL — ABNORMAL HIGH (ref 70–99)
Potassium: 4.2 mmol/L (ref 3.5–5.1)
Sodium: 139 mmol/L (ref 135–145)

## 2019-01-02 LAB — HIV ANTIBODY (ROUTINE TESTING W REFLEX): HIV Screen 4th Generation wRfx: NONREACTIVE

## 2019-01-02 LAB — MAGNESIUM: Magnesium: 2.1 mg/dL (ref 1.7–2.4)

## 2019-01-02 MED ORDER — FUROSEMIDE 40 MG PO TABS: 40.0000 mg | ORAL_TABLET | Freq: Every day | ORAL | 0 refills | Status: DC

## 2019-01-02 MED ORDER — FUROSEMIDE 40 MG PO TABS
40.0000 mg | ORAL_TABLET | Freq: Every day | ORAL | Status: DC
  Administered 2019-01-02: 40 mg via ORAL
  Filled 2019-01-02: qty 1

## 2019-01-02 NOTE — Discharge Instructions (Signed)
° °  You have a Stress Test scheduled at [facility]. Your doctor has ordered this test to check the blood flow in your heart arteries.  The whole test will take several hours. You may want to bring reading material to remain occupied while undergoing different parts of the test.  Instructions:  No food/drink after midnight the night before.  It is OK to take your morning meds with a sip of water EXCEPT for those types of medicines listed below or otherwise instructed.  No caffeine/decaf products 24 hours before, including medicines such as Excedrin or Goody Powders. Call if there are any questions.   Wear comfortable clothes and shoes.   Special Medication Instructions:  Beta blockers such as metoprolol (Lopressor/Toprol XL), atenolol (Tenormin), carvedilol (Coreg), nebivolol (Bystolic), bisoprolol (Zebeta), propranolol (Inderal) should not be taken for 24 hours before the test.  Calcium channel blockers such as diltiazem (Cardizem) or verapmil (Calan) should not be taken for 24 hours before the test.  Remove nitroglycerin patches and do not take nitrate preparations such as Imdur/isosorbide the day of your test.  No Persantine/Theophylline or Aggrenox medicines should be used within 24 hours of the test.   If you are diabetic, please ask which medications to hold the day of the test.  What To Expect: When you arrive in the lab, the technician will inject a small amount of radioactive tracer into your arm through an IV while you are resting quietly. This helps [place] to form pictures of your heart. You will likely only feel a sting from the IV. After a waiting period, resting pictures will be obtained under a big camera. These are the "before" pictures.  Next, you will be prepped for the stress portion of the test. This may include either walking on a treadmill or receiving a medicine that helps to dilate blood vessels in your heart to simulate the effect of exercise  on your heart. If you are walking on a treadmill, you will walk at different paces to try to get your heart rate to a goal number that is based on your age. If your doctor has chosen the pharmacologic test, then you will receive a medicine through your IV that may cause temporary nausea, flushing, shortness of breath and sometimes chest discomfort or vomiting. This is typically short-lived and usually resolves quickly. If you experience symptoms, that does not automatically mean the test is abnormal. Some patients do not experience any symptoms at all. Your blood pressure and heart rate will be monitored, and we will be watching your EKG on a computer screen for any changes. During this portion of the test, the radiologist will inject another small amount of radioactive tracer into your IV. After a waiting period, you will undergo a second set of pictures. These are the "after" pictures.  The doctor reading the test will compare the before-and-after images to look for evidence of heart blockages or heart weakness. The test usually takes 1 day to complete, but in certain instances (for example, if a patient is over a certain weight limit), the test may be done over the span of 2 days.

## 2019-01-02 NOTE — Telephone Encounter (Signed)
-----   Message from Ellsworth Lennox, [place] sent at 01/02/2019  9:14 AM EDT ----- Regarding: Outpatient Exercise Myoview Hi Kisha,   This patient needs an outpatient Exercise Myoview per Dr. Purvis Sheffield for chest pain. Should be discharged today. Will need a follow-up appointment after stress test complete Aurther Loft, I did not schedule her appointment yet as I did not know when the stress test would take place).   Thanks,  [place]

## 2019-01-02 NOTE — Progress Notes (Signed)
Progress Note  Patient Name: [name] Date of Encounter: 01/02/2019  Primary Cardiologist: Prentice Docker, MD   Subjective   She is doing well this morning.  She denies chest pain, palpitations, and shortness of breath.  She is ready to go home.  Inpatient Medications    Scheduled Meds: . amLODipine  10 mg Oral Daily  . ARIPiprazole  10 mg Oral Daily  . aspirin EC  81 mg Oral Daily  . busPIRone  15 mg Oral TID  . enoxaparin (LOVENOX) injection  40 mg Subcutaneous Q24H  . FLUoxetine  80 mg Oral Daily  . furosemide  40 mg Intravenous BID  . gabapentin  300 mg Oral QID  . mometasone-formoterol  2 puff Inhalation BID  . pantoprazole  40 mg Oral Daily  . potassium chloride  20 mEq Oral BID  . prazosin  3 mg Oral QHS  . ranolazine  500 mg Oral BID  . rosuvastatin  5 mg Oral Daily  . topiramate  50 mg Oral Daily   Continuous Infusions:  PRN Meds: acetaminophen, ALPRAZolam, cyclobenzaprine, ipratropium-albuterol, nitroGLYCERIN, ondansetron (ZOFRAN) IV, rOPINIRole, zolpidem   Vital Signs    Vitals:   01/01/19 2050 01/01/19 2134 01/02/19 0539 01/02/19 0733  BP:  (!) 98/41 (!) 116/52   Pulse:  (!) 58 (!) 49   Resp:  16 16   Temp:  97.8 F (36.6 C) 97.9 F (36.6 C)   TempSrc:   Oral   SpO2: 96% 96% 96% 94%  Weight:      Height:        Intake/Output Summary (Last 24 hours) at 01/02/2019 0828 Last data filed at 01/02/2019 0600 Gross per 24 hour  Intake 1440 ml  Output 3400 ml  Net -1960 ml   Filed Weights   12/31/18 0937 12/31/18 1643  Weight: 67.6 kg 70.1 kg    Telemetry    Sinus rhythm with frequent PVCs- Personally Reviewed   Physical Exam   GEN: No acute distress.   Neck: No JVD Cardiac: RRR, no murmurs, rubs, or gallops.  Respiratory:  Faint bibasilar rales. GI: Soft, nontender, non-distended  MS: No edema; No deformity. Neuro:  Nonfocal  Psych: Normal affect   Labs    Chemistry Recent Labs  Lab 12/31/18 1008 01/01/19 0452  01/02/19 0450  NA 139 139 139  K 3.7 3.7 4.2  CL 107 103 104  CO2 23 26 26   GLUCOSE 100* 115* 114*  BUN 12 15 21*  CREATININE 0.78 0.84 1.05*  CALCIUM 8.8* 9.2 9.8  GFRNONAA >60 >60 >60  GFRAA >60 >60 >60  ANIONGAP 9 10 9      Hematology Recent Labs  Lab 12/31/18 1008  WBC 8.4  RBC 3.68*  HGB 10.7*  HCT 34.5*  MCV 93.8  MCH 29.1  MCHC 31.0  RDW 13.2  PLT 303    Cardiac Enzymes Recent Labs  Lab 12/31/18 1008 12/31/18 1703 12/31/18 2212 01/01/19 0452  TROPONINI <0.03 <0.03 <0.03 <0.03   No results for input(s): TROPIPOC in the last 168 hours.   BNP Recent Labs  Lab 12/31/18 1008  BNP 303.0*     DDimer  Recent Labs  Lab 12/31/18 1008  DDIMER 0.70*     Radiology    Dg Chest 2 View  Result Date: 12/31/2018 CLINICAL DATA:  Chest pain and shortness of breath since yesterday. EXAM: CHEST - 2 VIEW COMPARISON:  10/23/2018 FINDINGS: Mild hyperinflation. Prior median sternotomy. Midline trachea. Mild cardiomegaly. Atherosclerosis in the  transverse aorta. Trace bilateral pleural effusions. Mild biapical pleural thickening. Pulmonary interstitial thickening is mild and new since the prior. No lobar consolidation. IMPRESSION: 1. Mild pulmonary venous congestion. 2. Trace bilateral pleural effusions. 3.  Aortic Atherosclerosis (ICD10-I70.0). Electronically Signed   By: Jeronimo Greaves M.D.   On: 12/31/2018 10:54   Ct Angio Chest Pe W Or Wo Contrast  Result Date: 12/31/2018 CLINICAL DATA:  Shortness of breath EXAM: CT ANGIOGRAPHY CHEST WITH CONTRAST TECHNIQUE: Multidetector CT imaging of the chest was performed using the standard protocol during bolus administration of intravenous contrast. Multiplanar CT image reconstructions and MIPs were obtained to evaluate the vascular anatomy. CONTRAST:  75mL OMNIPAQUE IOHEXOL 350 MG/ML SOLN COMPARISON:  None. FINDINGS: Cardiovascular: Satisfactory opacification of the pulmonary arteries to the segmental level. No evidence of pulmonary  embolism. Normal heart size. Status median sternotomy and CABG. No pericardial effusion. There is irregular mural thrombus of the distal aortic arch (series 5, image 81, series 8, image 81). Mediastinum/Nodes: No enlarged mediastinal, hilar, or axillary lymph nodes. Thyroid gland, trachea, and esophagus demonstrate no significant findings. Lungs/Pleura: Mild centrilobular emphysema. Small bilateral pleural effusions and associated atelectasis or consolidation. Mild diffuse interlobular septal thickening. Upper Abdomen: No acute abnormality. Musculoskeletal: No chest wall abnormality. No acute or significant osseous findings. Review of the MIP images confirms the above findings. IMPRESSION: 1. Negative examination for pulmonary embolism. 2. 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 3. Small bilateral pleural effusions and mild interstitial pulmonary edema. 4. Emphysema. Emphysema (ICD10-J43.9). Electronically Signed   By: Lauralyn Primes M.D.   On: 12/31/2018 17:59    Cardiac Studies   Echocardiogram 01/01/2019:   1. The left ventricle has normal systolic function with an ejection fraction of 60-65%. The cavity size was normal. Left ventricular diastolic parameters were normal No evidence of left ventricular regional wall motion abnormalities.  2. The mitral valve is normal in structure.  3. The tricuspid valve is normal in structure.  4. The aortic valve is tricuspid.  5. The aortic root is normal in size and structure.  6. No evidence of left ventricular regional wall motion abnormalities.  Patient Profile     53 y.o. female with a hx of CAD who is being seen today for the evaluation of chest pain ans SOB at the request of Dr Kerry Hough.  Assessment & Plan    1.  Acute diastolic heart failure: Symptomatically improved.  Faint bibasilar Rales.  Although left ventricular diastolic parameters were normal by echocardiogram, it is possible that she had transient diastolic dysfunction potentially due  to ischemia.  Regarding this, I plan to pursue an outpatient nuclear stress test.  -1960 cc in last 24 hours.  I will switch IV Lasix to oral 40 mg daily.  I asked her to take an extra 40 mg this evening and to always take supplemental potassium with Lasix.  I informed her going forward to take Lasix 40 mg daily and to weigh herself daily and should weight increased by 3 pounds in 24 hours or 5 pounds in a week, to take extra Lasix.    2.  Chest pain: History of coronary disease and three-vessel CABG in 2009.  Nuclear stress test in 2017 was low risk.  No recurrences.  Troponins were normal.  I will pursue an outpatient nuclear stress test.  Continue aspirin, ranolazine, and rosuvastatin.  3.  Hypertension: BP is normal.  4.  COPD: Symptoms are stable.  5.  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  More significant thrombus in the aortic arch just distal to the takeoff of the subclavian artery.  There is a smaller amount of thrombus distally.  Vascular surgery was previously contacted by the hospitalist and as there has been no ongoing thromboembolic disease, the plan is to continue aspirin daily.  6.  Obstructive sleep apnea: She started CPAP about 2 weeks ago but refused therapy last night.  This is contributing to bradycardia.  7.  Bradycardia: Sleep apnea likely contributing.  CPAP started about 2 weeks ago.  I will also arrange for an outpatient nuclear stress test as I wonder about the SVG to RCA distribution.  8.  Frequent PVCs: Potassium is normal.  I will check a magnesium level.  For questions or updates, please contact [facility] Please consult [email or web address] for contact info under Cardiology/STEMI.      Signed, Prentice Docker, MD  01/02/2019, 8:28 AM

## 2019-01-02 NOTE — Discharge Summary (Signed)
Physician Discharge Summary  [name] ZOX:[id number] DOB: 1966/09/15 DOA: 12/31/2018  PCP: Samuel Jester, DO  Admit date: 12/31/2018 Discharge date: 01/02/2019  Admitted From: Home Disposition: Home  Recommendations for Outpatient Follow-up:  1. Follow up with PCP in 1-2 weeks 2. Please obtain BMP/CBC in one week 3. Follow-up scheduled with cardiology.  Discharge Condition: Stable CODE STATUS: Full code Diet recommendation: Heart healthy  Brief/Interim Summary: 53 year old female admitted to the hospital with chest pain shortness of breath.  Has a history of coronary artery disease, COPD, chronic diastolic heart failure.  Patient ruled out for ACS with negative cardiac markers.  She did not have decompensated CHF and was diuresed with intravenous Lasix.  With significant urine output, her shortness of breath and chest pressure has improved.  She was seen by cardiology and underwent echocardiogram that showed preserved ejection fraction.  Plans are for outpatient stress testing.  Patient is feeling significantly improved and feels ready for discharge home.  Discharge Diagnoses:  Principal Problem:   Chest pain Active Problems:   Essential hypertension   Tobacco abuse   CAD (coronary artery disease)   Emphysema of lung (HCC)   Anxiety   OSA on CPAP   Chronic diastolic CHF (congestive heart failure) [facility])    Discharge Instructions  Discharge Instructions    Diet - low sodium heart healthy   Complete by:  As directed    Increase activity slowly   Complete by:  As directed      Allergies as of 01/02/2019   No Known Allergies     Medication List    TAKE these medications   ALPRAZolam 0.5 MG tablet Commonly known as:  XANAX Take 0.5 mg by mouth 3 (three) times daily as needed. For anxiety  ** [employer]  90 DAY SUPPLY**   amLODipine 10 MG tablet Commonly known as:  NORVASC Take 1 tablet (10 mg total) by mouth daily.   ARIPiprazole 10 MG tablet Commonly  known as:  ABILIFY Take 1 tablet (10 mg total) by mouth daily.   aspirin EC 81 MG tablet Take 81 mg by mouth daily.   benzonatate 100 MG capsule Commonly known as:  TESSALON Take 1 capsule (100 mg total) by mouth 3 (three) times daily as needed for cough.   budesonide-formoterol 160-4.5 MCG/ACT inhaler Commonly known as:  SYMBICORT Inhale 2 puffs into the lungs 2 (two) times daily.   busPIRone 15 MG tablet Commonly known as:  BUSPAR Take 1 tablet (15 mg total) by mouth 3 (three) times daily.   cyclobenzaprine 10 MG tablet Commonly known as:  FLEXERIL Take 10 mg by mouth 2 (two) times daily as needed for muscle spasms.   diphenoxylate-atropine 2.5-0.025 MG tablet Commonly known as:  LOMOTIL Take 2.5 mg by mouth daily as needed.   esomeprazole 40 MG capsule Commonly known as:  NEXIUM Take 40 mg by mouth every morning.   FLUoxetine 40 MG capsule Commonly known as:  PROZAC Take 2 capsules (80 mg total) by mouth daily.   furosemide 40 MG tablet Commonly known as:  LASIX Take 1 tablet (40 mg total) by mouth daily. What changed:    medication strength  how much to take  when to take this   gabapentin 300 MG capsule Commonly known as:  NEURONTIN Take 1 capsule by mouth 4 (four) times daily.   nitroGLYCERIN 0.4 MG SL tablet Commonly known as:  NITROSTAT Place 1 tablet (0.4 mg total) under the tongue every 5 (five) minutes x  3 doses as needed for chest pain (if no relief after 3rd dose, proceed to the ED for an evaluation).   potassium chloride 20 MEQ packet Commonly known as:  KLOR-CON Take 20 mEq by mouth 2 (two) times daily.   prazosin 1 MG capsule Commonly known as:  MINIPRESS Take 3 capsules (3 mg total) by mouth at bedtime. What changed:  how much to take   ProAir HFA 108 (90 Base) MCG/ACT inhaler Generic drug:  albuterol Inhale 2 puffs into the lungs every 6 (six) hours as needed for wheezing or shortness of breath.   ranolazine 500 MG 12 hr  tablet Commonly known as:  Ranexa Take 1 tablet (500 mg total) by mouth 2 (two) times daily.   rOPINIRole 1 MG tablet Commonly known as:  REQUIP Take 1 mg by mouth at bedtime as needed (for restless leg).   rosuvastatin 5 MG tablet Commonly known as:  CRESTOR Take 1 tablet (5 mg total) by mouth daily.   topiramate 50 MG tablet Commonly known as:  TOPAMAX Take 50 mg by mouth daily.   Vitamin D (Ergocalciferol) 1.25 MG (50000 UT) Caps capsule Commonly known as:  DRISDOL Take 50,000 Units by mouth once a week.   zolpidem 10 MG tablet Commonly known as:  AMBIEN Take 1 tablet by mouth at bedtime as needed.      Follow-up Information    [facility] On 01/09/2019.   Why:  Register at 9:15 am at the Radiology Dept for stress test. Do not eat or drink after midnight. Contact information: [address] [place] [phone]       Ellsworth Lennox, PA-C On 02/02/2019.   Specialties:  Physician Assistant, Cardiology Why:  at 1:00 pm Contact information: [address] [place] [address] [phone]          No Known Allergies  Consultations:  Cardiology   Procedures/Studies: Dg Chest 2 View  Result Date: 12/31/2018 CLINICAL DATA:  Chest pain and shortness of breath since yesterday. EXAM: CHEST - 2 VIEW COMPARISON:  10/23/2018 FINDINGS: Mild hyperinflation. Prior median sternotomy. Midline trachea. Mild cardiomegaly. Atherosclerosis in the transverse aorta. Trace bilateral pleural effusions. Mild biapical pleural thickening. Pulmonary interstitial thickening is mild and new since the prior. No lobar consolidation. IMPRESSION: 1. Mild pulmonary venous congestion. 2. Trace bilateral pleural effusions. 3.  Aortic Atherosclerosis (ICD10-I70.0). Electronically Signed   By: Jeronimo Greaves M.D.   On: 12/31/2018 10:54   Ct Angio Chest Pe W Or Wo Contrast  Result Date: 12/31/2018 CLINICAL DATA:  Shortness of breath EXAM: CT ANGIOGRAPHY CHEST  WITH CONTRAST TECHNIQUE: Multidetector CT imaging of the chest was performed using the standard protocol during bolus administration of intravenous contrast. Multiplanar CT image reconstructions and MIPs were obtained to evaluate the vascular anatomy. CONTRAST:  75mL OMNIPAQUE IOHEXOL 350 MG/ML SOLN COMPARISON:  None. FINDINGS: Cardiovascular: Satisfactory opacification of the pulmonary arteries to the segmental level. No evidence of pulmonary embolism. Normal heart size. Status median sternotomy and CABG. No pericardial effusion. There is irregular mural thrombus of the distal aortic arch (series 5, image 81, series 8, image 81). Mediastinum/Nodes: No enlarged mediastinal, hilar, or axillary lymph nodes. Thyroid gland, trachea, and esophagus demonstrate no significant findings. Lungs/Pleura: Mild centrilobular emphysema. Small bilateral pleural effusions and associated atelectasis or consolidation. Mild diffuse interlobular septal thickening. Upper Abdomen: No acute abnormality. Musculoskeletal: No chest wall abnormality. No acute or significant osseous findings. Review of the MIP images confirms the above  findings. IMPRESSION: 1. Negative examination for pulmonary embolism. 2. 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 3. Small bilateral pleural effusions and mild interstitial pulmonary edema. 4. Emphysema. Emphysema (ICD10-J43.9). Electronically Signed   By: Lauralyn Primes M.D.   On: 12/31/2018 17:59   Vas US Carotid  Result Date: 12/27/2018 Carotid Arterial Duplex Study Indications:       Carotid artery disease. Risk Factors:      Hypertension, current smoker, coronary artery disease. Comparison Study:  In 11/21/15, carotid Duplex showed 1-39% ICA stenosis,                    bilaterally. Patient denies any cerebrovasculaer symptoms. Performing Technologist: Jake Seats RDMS, RVT, RDCS  Examination Guidelines: A complete evaluation includes B-mode imaging, spectral Doppler, color Doppler, and power  Doppler as needed of all accessible portions of each vessel. Bilateral testing is considered an integral part of a complete examination. Limited examinations for reoccurring indications may be performed as noted.  Right Carotid Findings: +----------+--------+--------+--------+--------+--------+           PSV cm/sEDV cm/sStenosisDescribeComments +----------+--------+--------+--------+--------+--------+ CCA Prox  144     19                               +----------+--------+--------+--------+--------+--------+ CCA Distal121     34                               +----------+--------+--------+--------+--------+--------+ ICA Prox  112     30      1-39%   calcific         +----------+--------+--------+--------+--------+--------+ ICA Mid   119     36                               +----------+--------+--------+--------+--------+--------+ ICA Distal125     36                               +----------+--------+--------+--------+--------+--------+ ECA       127     26                               +----------+--------+--------+--------+--------+--------+ +----------+--------+-------+----------------+-------------------+           PSV cm/sEDV cmsDescribe        Arm Pressure (mmHG) +----------+--------+-------+----------------+-------------------+ [id number]     0      Multiphasic, [id number]                 +----------+--------+-------+----------------+-------------------+ +---------+--------+--+--------+--+---------+ VertebralPSV cm/s57EDV cm/s11Antegrade +---------+--------+--+--------+--+---------+  Left Carotid Findings: +----------+--------+--------+--------+--------+--------+           PSV cm/sEDV cm/sStenosisDescribeComments +----------+--------+--------+--------+--------+--------+ CCA Prox  131     20                               +----------+--------+--------+--------+--------+--------+ CCA Distal104     22                                +----------+--------+--------+--------+--------+--------+ ICA Prox  73      20      Normal                   +----------+--------+--------+--------+--------+--------+  ICA Mid   116     29                               +----------+--------+--------+--------+--------+--------+ ICA Distal111     31                               +----------+--------+--------+--------+--------+--------+ ECA       156     20                               +----------+--------+--------+--------+--------+--------+ +----------+--------+--------+----------------+-------------------+ SubclavianPSV cm/sEDV cm/sDescribe        Arm Pressure (mmHG) +----------+--------+--------+----------------+-------------------+           187     0       Multiphasic, [id number]                 +----------+--------+--------+----------------+-------------------+ +---------+--------+--+--------+--+---------+ VertebralPSV cm/s64EDV cm/s16Antegrade +---------+--------+--+--------+--+---------+  Summary: Right Carotid: Velocities in the right ICA are consistent with a 1-39% stenosis. Left Carotid: There is no evidence of stenosis in the left ICA. Vertebrals:  Bilateral vertebral arteries demonstrate antegrade flow. Subclavians: Normal flow hemodynamics were seen in bilateral subclavian              arteries. *See table(s) above for measurements and observations.  Electronically signed by Nanetta Batty MD on 12/27/2018 at 1:07:07 PM.    Final        Subjective: Feeling better.  Shortness of breath is improved.  Discharge Exam: Vitals:   01/02/19 0539 01/02/19 0733 01/02/19 0946 01/02/19 1403  BP: (!) 116/52  (!) 115/56 (!) 130/49  Pulse: (!) 49   (!) 58  Resp: 16   16  Temp: 97.9 F (36.6 C)   98.5 F (36.9 C)  TempSrc: Oral   Oral  SpO2: 96% 94%  96%  Weight:      Height:        General: Pt is alert, awake, not in acute distress Cardiovascular: RRR, S1/S2 +, no rubs, no gallops Respiratory: CTA  bilaterally, no wheezing, no rhonchi Abdominal: Soft, NT, ND, bowel sounds + Extremities: no edema, no cyanosis    The results of significant diagnostics from this hospitalization (including imaging, microbiology, ancillary and laboratory) are listed below for reference.     Microbiology: No results found for this or any previous visit (from the past 240 hour(s)).   Labs: BNP (last 3 results) Recent Labs    10/23/18 1322 12/31/18 1008  BNP 12.0 303.0*   Basic Metabolic Panel: Recent Labs  Lab 12/31/18 1008 01/01/19 0452 01/02/19 0450 01/02/19 0846  NA 139 139 139  --   K 3.7 3.7 4.2  --   CL 107 103 104  --   CO2 23 26 26   --   GLUCOSE 100* 115* 114*  --   BUN 12 15 21*  --   CREATININE 0.78 0.84 1.05*  --   CALCIUM 8.8* 9.2 9.8  --   MG  --   --   --  2.1   Liver Function Tests: No results for input(s): AST, ALT, ALKPHOS, BILITOT, PROT, ALBUMIN in the last 168 hours. No results for input(s): LIPASE, AMYLASE in the last 168 hours. No results for input(s): AMMONIA in the last 168 hours. CBC: Recent Labs  Lab 12/31/18 1008  WBC 8.4  HGB 10.7*  HCT 34.5*  MCV 93.8  PLT 303   Cardiac Enzymes: Recent Labs  Lab 12/31/18 1008 12/31/18 1703 12/31/18 2212 01/01/19 0452  TROPONINI <0.03 <0.03 <0.03 <0.03   BNP: Invalid input(s): POCBNP CBG: No results for input(s): GLUCAP in the last 168 hours. D-Dimer Recent Labs    12/31/18 1008  DDIMER 0.70*   Hgb A1c No results for input(s): HGBA1C in the last 72 hours. Lipid Profile No results for input(s): CHOL, HDL, LDLCALC, TRIG, CHOLHDL, LDLDIRECT in the last 72 hours. Thyroid function studies Recent Labs    12/31/18 1008  TSH 1.865   Anemia work up No results for input(s): VITAMINB12, FOLATE, FERRITIN, TIBC, IRON, RETICCTPCT in the last 72 hours. Urinalysis    Component Value Date/Time   COLORURINE YELLOW 12/03/2009 2300   APPEARANCEUR CLEAR 12/03/2009 2300   LABSPEC 1.024 12/03/2009 2300    PHURINE 6.0 12/03/2009 2300   GLUCOSEU NEGATIVE 12/03/2009 2300   HGBUR NEGATIVE 12/03/2009 2300   BILIRUBINUR NEGATIVE 12/03/2009 2300   KETONESUR 15 (A) 12/03/2009 2300   PROTEINUR NEGATIVE 12/03/2009 2300   UROBILINOGEN 1.0 12/03/2009 2300   NITRITE NEGATIVE 12/03/2009 2300   LEUKOCYTESUR  12/03/2009 2300    NEGATIVE MICROSCOPIC NOT DONE ON URINES WITH NEGATIVE PROTEIN, BLOOD, LEUKOCYTES, NITRITE, OR GLUCOSE <1000 mg/dL.   Sepsis Labs Invalid input(s): PROCALCITONIN,  WBC,  LACTICIDVEN Microbiology No results found for this or any previous visit (from the past 240 hour(s)).   Time coordinating discharge:  SIGNED:   Erick Blinks, MD  [facility] 01/02/2019, 9:07 PM   If 7PM-7AM, please contact night-coverage [email or web address]

## 2019-01-02 NOTE — Progress Notes (Signed)
Patient received discharge instructions including medications and follow up appointments. All questions were answered and no further questions at this time. Pt escorted by nurse tech

## 2019-01-03 ENCOUNTER — Telehealth: Payer: Self-pay | Admitting: *Deleted

## 2019-01-03 NOTE — Telephone Encounter (Signed)
Notes recorded by Lesle Chris, LPN on 5/00/3704 at 4:47 PM EDT Patient notified. Copy to pmd. Follow up scheduled for April with Randall An, PA in [place] office. ------  Notes recorded by Laqueta Linden, MD on 12/27/2018 at 9:50 AM EST Very mild right sided plaque. Can be repeated in 2 years.

## 2019-01-05 ENCOUNTER — Encounter: Payer: Self-pay | Admitting: *Deleted

## 2019-01-08 ENCOUNTER — Telehealth: Payer: Self-pay | Admitting: Cardiovascular Disease

## 2019-01-08 NOTE — Telephone Encounter (Signed)
Will forward to [place] office to advise.

## 2019-01-08 NOTE — Telephone Encounter (Signed)
Patient advised that she needed to keep her stress appointment since she was recently seen in the ED for chest pain and sob. Verbalized understanding.

## 2019-01-08 NOTE — Telephone Encounter (Signed)
Left voicemail wanting to know if she needs to r/s her testing for tomorrow. She's not sure since all the sickness is going around.   [phone]

## 2019-01-09 ENCOUNTER — Encounter (HOSPITAL_COMMUNITY): Payer: Self-pay

## 2019-01-09 ENCOUNTER — Other Ambulatory Visit: Payer: Self-pay

## 2019-01-09 ENCOUNTER — Encounter (HOSPITAL_BASED_OUTPATIENT_CLINIC_OR_DEPARTMENT_OTHER)
Admission: RE | Admit: 2019-01-09 | Discharge: 2019-01-09 | Disposition: A | Discharge status: Home / Self Care | Payer: Managed Care, Other (non HMO) | Source: Ambulatory Visit | Attending: Cardiovascular Disease | Admitting: Cardiovascular Disease

## 2019-01-09 ENCOUNTER — Ambulatory Visit (HOSPITAL_COMMUNITY)
Admission: RE | Admit: 2019-01-09 | Discharge: 2019-01-09 | Disposition: A | Discharge status: Home / Self Care | Payer: Managed Care, Other (non HMO) | Source: Ambulatory Visit | Attending: Cardiovascular Disease | Admitting: Cardiovascular Disease

## 2019-01-09 DIAGNOSIS — R079 Chest pain, unspecified: Secondary | ICD-10-CM | POA: Insufficient documentation

## 2019-01-09 LAB — NM MYOCAR MULTI W/SPECT W/WALL MOTION / EF
Estimated workload: 10.1 METS
Exercise duration (min): 8 min
Exercise duration (sec): 37 s
LHR: 0.35
LV dias vol: 55 mL (ref 46–106)
LV sys vol: 16 mL
MPHR: 167 {beats}/min
Peak HR: 146 {beats}/min
Percent HR: 87 %
RPE: 19
Rest HR: 48 {beats}/min
SDS: 0
SRS: 0
SSS: 0
TID: 1.1

## 2019-01-09 MED ORDER — TECHNETIUM TC 99M TETROFOSMIN IV KIT
30.0000 | PACK | Freq: Once | INTRAVENOUS | Status: AC | PRN
  Administered 2019-01-09: 29 via INTRAVENOUS

## 2019-01-09 MED ORDER — SODIUM CHLORIDE 0.9% FLUSH
INTRAVENOUS | Status: AC
  Administered 2019-01-09: 10 mL via INTRAVENOUS
  Filled 2019-01-09: qty 10

## 2019-01-09 MED ORDER — REGADENOSON 0.4 MG/5ML IV SOLN
INTRAVENOUS | Status: AC
  Filled 2019-01-09: qty 5

## 2019-01-09 MED ORDER — TECHNETIUM TC 99M TETROFOSMIN IV KIT
10.0000 | PACK | Freq: Once | INTRAVENOUS | Status: AC | PRN
  Administered 2019-01-09: 10.25 via INTRAVENOUS

## 2019-01-29 ENCOUNTER — Encounter: Payer: Self-pay | Admitting: *Deleted

## 2019-02-01 ENCOUNTER — Telehealth: Payer: Self-pay | Admitting: Student

## 2019-02-01 NOTE — Telephone Encounter (Signed)
Virtual Visit Pre-Appointment Phone Call  Steps For Call:  1. Confirm consent - "In the setting of the current Covid19 crisis, you are scheduled for a (phone or video) visit with your provider on (date) at (time).  Just as we do with many in-office visits, in order for you to participate in this visit, we must obtain consent.  If you'd like, I can send this to your mychart (if signed up) or email for you to review.  Otherwise, I can obtain your verbal consent now.  All virtual visits are billed to your insurance company just like a normal visit would be.  By agreeing to a virtual visit, we'd like you to understand that the technology does not allow for your provider to perform an examination, and thus may limit your provider's ability to fully assess your condition.  Finally, though the technology is pretty good, we cannot assure that it will always work on either your or our end, and in the setting of a video visit, we may have to convert it to a phone-only visit.  In either situation, we cannot ensure that we have a secure connection.  Are you willing to proceed?" YES (Verbal)  2. Give patient instructions for WebEx download to smartphone as below if video visit  3. Advise patient to be prepared with any vital sign or heart rhythm information, their current medicines, and a piece of paper and pen handy for any instructions they may receive the day of their visit  4. Inform patient they will receive a phone call 15 minutes prior to their appointment time (may be from unknown caller ID) so they should be prepared to answer  5. Confirm that appointment type is correct in Epic appointment notes (video vs telephone)    TELEPHONE CALL NOTE  [name] has been deemed a candidate for a follow-up tele-health visit to limit community [facility] during the Covid-19 pandemic. I spoke with the patient via phone to ensure availability of phone/video source, confirm preferred email & phone number,  and discuss instructions and expectations.  I reminded [name] to be prepared with any vital sign and/or heart rhythm information that could potentially be obtained via home monitoring, at the time of her visit. I reminded [name] to expect a phone call at the time of her visit if her visit.  Did the patient verbally acknowledge consent to treatment? Yes [name] 02/01/2019 9:14 AM   DOWNLOADING THE WEBEX SOFTWARE TO SMARTPHONE  - If Apple, go to [employer] Store and type in WebEx in the search bar. Download [employer] Meetings, the blue/green circle. The app is free but as with any other app downloads, their phone may require them to verify saved payment information or Apple password. The patient does NOT have to create an account.  - If Android, ask patient to go to [employer] Play Store and type in WebEx in the search bar. Download [employer] Meetings, the blue/green circle. The app is free but as with any other app downloads, their phone may require them to verify saved payment information or Android password. The patient does NOT have to create an account.   CONSENT FOR TELE-HEALTH VISIT - PLEASE REVIEW  I hereby voluntarily request, consent and authorize [facility] and its employed or contracted physicians, physician assistants, nurse practitioners or other licensed health care professionals (the Practitioner), to provide me with telemedicine health care services (the Services") as deemed necessary by the treating Practitioner.  I acknowledge and consent to receive the Services by the Practitioner via telemedicine. I understand that the telemedicine visit will involve communicating with the Practitioner through live audiovisual communication technology and the disclosure of certain medical information by electronic transmission. I acknowledge that I have been given the opportunity to request an in-person assessment or other available alternative prior to the telemedicine visit  and am voluntarily participating in the telemedicine visit.  I understand that I have the right to withhold or withdraw my consent to the use of telemedicine in the course of my care at any time, without affecting my right to future care or treatment, and that the Practitioner or I may terminate the telemedicine visit at any time. I understand that I have the right to inspect all information obtained and/or recorded in the course of the telemedicine visit and may receive copies of available information for a reasonable fee.  I understand that some of the potential risks of receiving the Services via telemedicine include:   Delay or interruption in medical evaluation due to technological equipment failure or disruption;  Information transmitted may not be sufficient (e.g. poor resolution of images) to allow for appropriate medical decision making by the Practitioner; and/or   In rare instances, security protocols could fail, causing a breach of personal health information.  Furthermore, I acknowledge that it is my responsibility to provide information about my medical history, conditions and care that is complete and accurate to the best of my ability. I acknowledge that Practitioner's advice, recommendations, and/or decision may be based on factors not within their control, such as incomplete or inaccurate data provided by me or distortions of diagnostic images or specimens that may result from electronic transmissions. I understand that the practice of medicine is not an exact science and that Practitioner makes no warranties or guarantees regarding treatment outcomes. I acknowledge that I will receive a copy of this consent concurrently upon execution via email to the email address I last provided but may also request a printed copy by calling the office of [facility].    I understand that my insurance will be billed for this visit.   I have read or had this consent read to me.  I understand  the contents of this consent, which adequately explains the benefits and risks of the Services being provided via telemedicine.   I have been provided ample opportunity to ask questions regarding this consent and the Services and have had my questions answered to my satisfaction.  I give my informed consent for the services to be provided through the use of telemedicine in my medical care  By participating in this telemedicine visit I agree to the above.

## 2019-02-02 ENCOUNTER — Encounter: Payer: Self-pay | Admitting: Student

## 2019-02-02 ENCOUNTER — Telehealth (INDEPENDENT_AMBULATORY_CARE_PROVIDER_SITE_OTHER): Payer: Managed Care, Other (non HMO) | Admitting: Student

## 2019-02-02 VITALS — BP 148/82 | HR 62 | Wt 149.0 lb

## 2019-02-02 DIAGNOSIS — I25708 Atherosclerosis of coronary artery bypass graft(s), unspecified, with other forms of angina pectoris: Secondary | ICD-10-CM | POA: Diagnosis not present

## 2019-02-02 DIAGNOSIS — I10 Essential (primary) hypertension: Secondary | ICD-10-CM

## 2019-02-02 DIAGNOSIS — E785 Hyperlipidemia, unspecified: Secondary | ICD-10-CM | POA: Diagnosis not present

## 2019-02-02 DIAGNOSIS — Z79899 Other long term (current) drug therapy: Secondary | ICD-10-CM

## 2019-02-02 DIAGNOSIS — I7410 Embolism and thrombosis of unspecified parts of aorta: Secondary | ICD-10-CM

## 2019-02-02 DIAGNOSIS — G4733 Obstructive sleep apnea (adult) (pediatric): Secondary | ICD-10-CM

## 2019-02-02 DIAGNOSIS — I5032 Chronic diastolic (congestive) heart failure: Secondary | ICD-10-CM | POA: Diagnosis not present

## 2019-02-02 DIAGNOSIS — I7419 Embolism and thrombosis of other parts of aorta: Secondary | ICD-10-CM

## 2019-02-02 DIAGNOSIS — Z7982 Long term (current) use of aspirin: Secondary | ICD-10-CM

## 2019-02-02 NOTE — Progress Notes (Signed)
Virtual Visit via Telephone Note   This visit type was conducted due to national recommendations for restrictions regarding the COVID-19 Pandemic (e.g. social distancing) in an effort to limit this patient's exposure and mitigate transmission in our community.  Due to her co-morbid illnesses, this patient is at least at moderate risk for complications without adequate follow up.  This format is felt to be most appropriate for this patient at this time.  The patient did not have access to video technology/had technical difficulties with video requiring transitioning to audio format only (telephone).  All issues noted in this document were discussed and addressed. No physical exam could be performed with this format.  Please refer to the patient's chart for her consent to telehealth for [facility]   Evaluation Performed:  Follow-up visit  Date:  02/02/2019   ID:  [name], DOB 06-17-1966, MRN [id number]  Patient Location: Home  Provider Location: Home  PCP:  Samuel Jester, DO  Cardiologist:  Prentice Docker, MD  Electrophysiologist:  None   Chief Complaint: Hospital Follow-Up  History of Present Illness:    [name] is a 53 y.o. female who presents via audio/video conferencing for a telehealth visit today. Past medical history includes CAD (s/p CABG in 2009 with LIMA-LAD, SVG-RI, and SVG-RCA, low-risk NST in 10/2015), COPD, HTN, HLD, OSA (on CPAP) and Bipolar Disorder.  She was recently admitted to [facility] in 12/2018 for evaluation of chest pain and dyspnea for the past 2 days leading up to admission. Reported associated orthopnea and was started in IV Lasix while in the ED. She was admitted for chest pain rule-out and cyclic enzymes remained negative. 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 This was reviewed with Vascular Surgery and it was recommended to treat with ASA given no evidence of  ongoing thromboembolic disease. The following morning, she felt back to baseline and IV Lasix was transitioned to PO Lasix  daily. An outpatient Treadmill Myoview was recommended for ischemic evaluation. This was performed on 3/17 and showed no significant perfusion defects, overall being a low-risk study.   In talking with the patient today, she reports her breathing continues to improve from hospital discharge. She has been checking her weight daily and this has declined since discharge, now at 149 lbs. Denies any new dyspnea or lower extremity edema. Has baseline 2 pillow orthopnea which is unchanged. She has a CPAP but is unable to use it as a part is missing and she is unable to afford this currently.   Does have intermittent chest pain which only lasts for a few minutes then resolves. She does utilize SL NTG about once per week. Previously intolerant to Imdur (added to allergy list) and remains on Ranexa. This is not covered well under her insurance and is $60 per month with [facility].   The patient does not have symptoms concerning for COVID-19 infection (fever, chills, cough, or new shortness of breath).    Past Medical History:  Diagnosis Date  . ADD (attention deficit disorder with hyperactivity)   . Anxiety   . Bipolar 1 disorder (HCC)   . Carotid artery disease (HCC)    Right 50-69% stenosis by doppler 2010  . Chronic diastolic (congestive) heart failure (HCC)   . Coronary artery disease    a. s/p CABG in 2009 with LIMA-LAD, SVG-RI, and SVG-RCA b. low-risk NST in 10/2015 c. low-risk NST in 12/2018.  Marland Kitchen  CVA (cerebral infarction) 12/2008   Reports a neurologist told her she had a blood clot in her brain  . Depression   . Emphysema of lung (HCC) 06/19/2018  . Fibromyalgia   . GERD (gastroesophageal reflux disease)   . Hyperlipidemia   . Hypertension   . OSA on CPAP 12/31/2018  . Tobacco abuse    Past Surgical History:  Procedure Laterality Date  . CARDIAC CATHETERIZATION    .  CORONARY ARTERY BYPASS GRAFT  2009   3V CABG LIMA --> LAD, SVG --> Ramus Intermediate,  SVG --> RCA  . TUBAL LIGATION  1990     Current Meds  Medication Sig  . ALPRAZolam (XANAX) 0.5 MG tablet Take 0.5 mg by mouth 3 (three) times daily as needed. For anxiety  ** EXPRESS SCRIPTS  90 DAY SUPPLY**  . amLODipine (NORVASC) 10 MG tablet Take 1 tablet (10 mg total) by mouth daily.  . ARIPiprazole (ABILIFY) 10 MG tablet Take 1 tablet (10 mg total) by mouth daily.  Marland Kitchen aspirin EC 81 MG tablet Take 81 mg by mouth daily.  . benzonatate (TESSALON) 100 MG capsule Take 1 capsule (100 mg total) by mouth 3 (three) times daily as needed for cough.  . budesonide-formoterol (SYMBICORT) 160-4.5 MCG/ACT inhaler Inhale 2 puffs into the lungs 2 (two) times daily.  . busPIRone (BUSPAR) 15 MG tablet Take 1 tablet (15 mg total) by mouth 3 (three) times daily.  . cyclobenzaprine (FLEXERIL) 10 MG tablet Take 10 mg by mouth 2 (two) times daily as needed for muscle spasms.   . diphenoxylate-atropine (LOMOTIL) 2.5-0.025 MG tablet Take 2.5 mg by mouth daily as needed.   Marland Kitchen esomeprazole (NEXIUM) 40 MG capsule Take 40 mg by mouth every morning.  Marland Kitchen FLUoxetine (PROZAC) 40 MG capsule Take 2 capsules (80 mg total) by mouth daily.  . furosemide (LASIX) 40 MG tablet Take 1 tablet (40 mg total) by mouth daily.  Marland Kitchen gabapentin (NEURONTIN) 300 MG capsule Take 1 capsule by mouth 4 (four) times daily.  . nitroGLYCERIN (NITROSTAT) 0.4 MG SL tablet Place 1 tablet (0.4 mg total) under the tongue every 5 (five) minutes x 3 doses as needed for chest pain (if no relief after 3rd dose, proceed to the ED for an evaluation).  . potassium chloride (KLOR-CON) 20 MEQ packet Take 20 mEq by mouth 2 (two) times daily.  . prazosin (MINIPRESS) 1 MG capsule Take 3 capsules (3 mg total) by mouth at bedtime. (Patient taking differently: Take 1-3 mg by mouth at bedtime. )  . PROAIR HFA 108 (90 Base) MCG/ACT inhaler Inhale 2 puffs into the lungs every 6 (six)  hours as needed for wheezing or shortness of breath.   . ranolazine (RANEXA) 500 MG 12 hr tablet Take 1 tablet (500 mg total) by mouth 2 (two) times daily.  Marland Kitchen rOPINIRole (REQUIP) 1 MG tablet Take 1 mg by mouth at bedtime as needed (for restless leg).   . rosuvastatin (CRESTOR) 5 MG tablet Take 1 tablet (5 mg total) by mouth daily.  Marland Kitchen topiramate (TOPAMAX) 50 MG tablet Take 50 mg by mouth daily.   . Vitamin D, Ergocalciferol, (DRISDOL) 1.25 MG (50000 UT) CAPS capsule Take 50,000 Units by mouth once a week.  . zolpidem (AMBIEN) 10 MG tablet Take 1 tablet by mouth at bedtime as needed.     Allergies:   Imdur [isosorbide nitrate]   Social History   Tobacco Use  . Smoking status: Former Smoker    Packs/day: 0.50  Years: 35.00    Pack years: 17.50    Types: Cigarettes    Start date: 12/14/1981    Last attempt to quit: 12/17/2018    Years since quitting: 0.1  . Smokeless tobacco: Never Used  Substance Use Topics  . Alcohol use: Not Currently  . Drug use: No     Family Hx: The patient's family history includes Alcohol abuse in her paternal grandfather; Intellectual disability in her maternal aunt and maternal uncle.  ROS:   Please see the history of present illness.     All other systems reviewed and are negative.   Prior CV studies:   The following studies were reviewed today:  Echocardiogram: 12/2018  IMPRESSIONS    1. The left ventricle has normal systolic function with an ejection fraction of 60-65%. The cavity size was normal. Left ventricular diastolic parameters were normal No evidence of left ventricular regional wall motion abnormalities.  2. The mitral valve is normal in structure.  3. The tricuspid valve is normal in structure.  4. The aortic valve is tricuspid.  5. The aortic root is normal in size and structure.  6. No evidence of left ventricular regional wall motion abnormalities.  NST: 01/09/2019  Blood pressure demonstrated a hypertensive response to  exercise.  Horizontal ST segment depression ST segment depression of 0.5 mm was noted during recovery in the II, III, aVF, V5 and V6 leads. The patient was asymptomatic.  The study is normal. No perfusion defects consistent with myocardial ischemia or scar.  This is a low risk study.  Nuclear stress EF: 71%.  Labs/Other Tests and Data Reviewed:    EKG:  An ECG dated 12/31/2018 was personally reviewed today and demonstrated:  Sinus bradycardia, HR 54, with Ventricular Trigeminy.   Recent Labs: 12/31/2018: B Natriuretic Peptide 303.0; Hemoglobin 10.7; Platelets 303; TSH 1.865 01/02/2019: BUN 21; Creatinine, Ser 1.05; Magnesium 2.1; Potassium 4.2; Sodium 139   Recent Lipid Panel Lab Results  Component Value Date/Time   CHOL 178 09/29/2009 08:37 PM   TRIG 185 (H) 09/29/2009 08:37 PM   HDL 43 09/29/2009 08:37 PM   CHOLHDL 4.1 Ratio 09/29/2009 08:37 PM   LDLCALC 98 09/29/2009 08:37 PM    Wt Readings from Last 3 Encounters:  02/02/19 149 lb (67.6 kg)  12/31/18 154 lb 8.7 oz (70.1 kg)  12/06/18 148 lb 3.2 oz (67.2 kg)     Objective:    Vital Signs:  BP (!) 148/82   Pulse 62   Wt 149 lb (67.6 kg)   LMP 10/22/2015   BMI 27.25 kg/m    General: Pleasant female sounding in NAD. Psych: Normal affect. Neuro: Alert and oriented X 3.  Lungs:  Respirations do not sound labored while talking on the phone.   ASSESSMENT & PLAN:    1. CAD - s/p CABG in 2009 with LIMA-LAD, SVG-RI, and SVG-RCA. Had low-risk NST in 10/2015 and recent NST in 12/2018 showed no significant perfusion defects and was a low-risk study as well.  - she does have brief episodes of chest pain which spontaneously resolve and occur at rest or with exertion. Has been occurring for a long time per her report.  - continue current medication regimen with ASA, Amlodipine, Ranexa, and statin therapy. Not on BB therapy given baseline bradycardia and intolerant to Imdur. We reviewed that Ranexa could be further titrated to   BID if needed in the future. She will call to update on her symptoms or having to use SL NTG more  frequently, as her dose could be titrated at that time.   2. Chronic Diastolic CHF - reports her dyspnea has improved since hospital discharge. Has baseline 2-pillow orthopnea which is unchanged. Weight down to 149 lbs on her home scales.  - continue with Lasix 40mg  daily. Encouraged to continue to follow daily weights and monitor sodium intake.    3. HTN - BP was slightly elevated at 148/82 when checked today but this is the first time she has checked it since hospital discharge. Was encouraged to check BP a few times per week and report back if readings remain elevated.  - continue Amlodipine and Prazosin.   4. HLD - followed by PCP. Goal LDL is less than 70 given known CAD. Remains on Crestor 5mg  daily.   5. 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 This was reviewed with Vascular Surgery and it was recommended to treat with ASA given no evidence of ongoing thromboembolic disease. - continue ASA 81mg  daily.   6. OSA - currently not using CPAP due to needing new parts. Encouraged her to reach out to her insurance to see if this could be covered.  - followed by Dr. Juanetta GoslingHawkins.    COVID-19 Education: The signs and symptoms of COVID-19 were discussed with the patient and how to seek care for testing (follow up with PCP or arrange E-visit).  The importance of social distancing was discussed today.  Time:   Today, I have spent 19 minutes with the patient with telehealth technology discussing the above problems.     Medication Adjustments/Labs and Tests Ordered: Current medicines are reviewed at length with the patient today.  Concerns regarding medicines are outlined above.  Tests Ordered: No orders of the defined types were placed in this encounter.  Medication Changes: No orders of the defined types were placed in this encounter.    Disposition:  Follow up with Dr. Purvis SheffieldKoneswaran in 6 months.   Signed, Ellsworth LennoxBrittany M Adonna Horsley, PA-C  02/02/2019 1:48 PM    [facility]

## 2019-02-02 NOTE — Patient Instructions (Addendum)
Medication Instructions:  Continue all current medications.  Labwork: None  Testing/Procedures: None  Follow-Up: Your physician wants you to follow up in: 6 months.  You will receive a reminder letter in the mail one-two months in advance.  If you don't receive a letter, please call our office to schedule the follow up appointment.  ([facility] OFFICE)  Any Other Special Instructions Will Be Listed Below (If Applicable).  If you need a refill on your cardiac medications before your next appointment, please call your pharmacy.

## 2019-02-14 ENCOUNTER — Telehealth: Payer: Self-pay | Admitting: *Deleted

## 2019-02-14 NOTE — Telephone Encounter (Signed)
Lipids not done.  Call placed to patient to inquire if done some place else.  Initially requested in February.   Left message to return call.

## 2019-03-08 ENCOUNTER — Telehealth (HOSPITAL_COMMUNITY): Payer: Self-pay | Admitting: Psychiatry

## 2019-03-08 NOTE — Telephone Encounter (Signed)
Received paperwork to apply for long term disability and visit note this year. Could you notify the patient that we would not be able to provide the form/notes as she has not been seen since last November. I would advise her to have follow up if she would like to discuss this further.

## 2019-03-08 NOTE — Telephone Encounter (Signed)
Appointment - Telephone call with patient to inform disability papers from her life insurance company had came in but that Dr.Hisada would need to be able to see her again before completing due to patient last seen 09/20/18.  Patient stated understanding and agreed to set up an appointment.  Informed patient this would be a virtual visit and patient agreed with plan to schedule.  Attempted to transfer to reception to schedule but patient disconnected.  Left several messages for patient to call us back to assist with setting up virtual visit with Dr. Vanetta Shawl.

## 2019-03-21 ENCOUNTER — Telehealth: Payer: Managed Care, Other (non HMO) | Admitting: Cardiovascular Disease

## 2019-10-07 ENCOUNTER — Other Ambulatory Visit: Payer: Self-pay

## 2019-10-07 ENCOUNTER — Encounter (HOSPITAL_COMMUNITY): Payer: Self-pay | Admitting: Emergency Medicine

## 2019-10-07 ENCOUNTER — Emergency Department (HOSPITAL_COMMUNITY): Payer: Managed Care, Other (non HMO)

## 2019-10-07 ENCOUNTER — Emergency Department (HOSPITAL_COMMUNITY)
Admission: EM | Admit: 2019-10-07 | Discharge: 2019-10-07 | Disposition: A | Discharge status: Home / Self Care | Payer: Managed Care, Other (non HMO) | Attending: Emergency Medicine | Admitting: Emergency Medicine

## 2019-10-07 DIAGNOSIS — Z951 Presence of aortocoronary bypass graft: Secondary | ICD-10-CM | POA: Insufficient documentation

## 2019-10-07 DIAGNOSIS — I110 Hypertensive heart disease with heart failure: Secondary | ICD-10-CM | POA: Diagnosis not present

## 2019-10-07 DIAGNOSIS — R079 Chest pain, unspecified: Secondary | ICD-10-CM | POA: Diagnosis present

## 2019-10-07 DIAGNOSIS — I5032 Chronic diastolic (congestive) heart failure: Secondary | ICD-10-CM | POA: Insufficient documentation

## 2019-10-07 DIAGNOSIS — R0602 Shortness of breath: Secondary | ICD-10-CM | POA: Insufficient documentation

## 2019-10-07 DIAGNOSIS — I2510 Atherosclerotic heart disease of native coronary artery without angina pectoris: Secondary | ICD-10-CM | POA: Insufficient documentation

## 2019-10-07 DIAGNOSIS — Z7982 Long term (current) use of aspirin: Secondary | ICD-10-CM | POA: Insufficient documentation

## 2019-10-07 DIAGNOSIS — Z79899 Other long term (current) drug therapy: Secondary | ICD-10-CM | POA: Insufficient documentation

## 2019-10-07 DIAGNOSIS — Z87891 Personal history of nicotine dependence: Secondary | ICD-10-CM | POA: Insufficient documentation

## 2019-10-07 DIAGNOSIS — Z20828 Contact with and (suspected) exposure to other viral communicable diseases: Secondary | ICD-10-CM | POA: Diagnosis not present

## 2019-10-07 LAB — TROPONIN I (HIGH SENSITIVITY)
Troponin I (High Sensitivity): 4 ng/L (ref <18)
Troponin I (High Sensitivity): 6 ng/L (ref <18)

## 2019-10-07 LAB — CBC
HCT: 37.6 % (ref 36.0–46.0)
Hemoglobin: 12.1 g/dL (ref 12.0–15.0)
MCH: 28.5 pg (ref 26.0–34.0)
MCHC: 32.2 g/dL (ref 30.0–36.0)
MCV: 88.7 fL (ref 80.0–100.0)
Platelets: 525 10*3/uL — ABNORMAL HIGH (ref 150–400)
RBC: 4.24 MIL/uL (ref 3.87–5.11)
RDW: 14.8 % (ref 11.5–15.5)
WBC: 13.9 10*3/uL — ABNORMAL HIGH (ref 4.0–10.5)
nRBC: 0 % (ref 0.0–0.2)

## 2019-10-07 LAB — BASIC METABOLIC PANEL
Anion gap: 12 (ref 5–15)
BUN: 13 mg/dL (ref 6–20)
CO2: 24 mmol/L (ref 22–32)
Calcium: 9.5 mg/dL (ref 8.9–10.3)
Chloride: 102 mmol/L (ref 98–111)
Creatinine, Ser: 0.59 mg/dL (ref 0.44–1.00)
GFR calc Af Amer: >60 mL/min (ref >60)
GFR calc non Af Amer: >60 mL/min (ref >60)
Glucose, Bld: 110 mg/dL — ABNORMAL HIGH (ref 70–99)
Potassium: 3.5 mmol/L (ref 3.5–5.1)
Sodium: 138 mmol/L (ref 135–145)

## 2019-10-07 LAB — BRAIN NATRIURETIC PEPTIDE: B Natriuretic Peptide: 85 pg/mL (ref 0.0–100.0)

## 2019-10-07 LAB — D-DIMER, QUANTITATIVE: D-Dimer, Quant: 0.61 ug/mL-FEU — ABNORMAL HIGH (ref 0.00–0.50)

## 2019-10-07 LAB — POC SARS CORONAVIRUS 2 AG -  ED: SARS Coronavirus 2 Ag: NEGATIVE

## 2019-10-07 MED ORDER — FUROSEMIDE 40 MG PO TABS
40.0000 mg | ORAL_TABLET | Freq: Once | ORAL | Status: AC
  Administered 2019-10-07: 40 mg via ORAL
  Filled 2019-10-07: qty 1

## 2019-10-07 MED ORDER — IOHEXOL 350 MG/ML SOLN
100.0000 mL | Freq: Once | INTRAVENOUS | Status: AC | PRN
  Administered 2019-10-07: 100 mL via INTRAVENOUS

## 2019-10-07 MED ORDER — SODIUM CHLORIDE 0.9% FLUSH: 3.0000 mL | Freq: Once | INTRAVENOUS | Status: DC

## 2019-10-07 NOTE — ED Notes (Signed)
IV removed to right AC, catheter intact and site WNL, dsg applied to site.

## 2019-10-07 NOTE — ED Provider Notes (Signed)
[facility] EMERGENCY DEPARTMENT Provider Note   CSN: [id number] Arrival date & time: 10/07/19  1022     History Chief Complaint  Patient presents with  . Chest Pain    [name] is a 53 y.o. female.  HPI      [name] is a 53 y.o. female with PMH of CAD, CHF, CVA and fibromyalgia who presents to the Emergency Department complaining of generalized pressure to her chest with shortness of breath.  Symptoms have been gradually worsening for 3 to 4 days.  She reports a history of CHF and feels her current symptoms are related.  She has been increasing her Lasix dose without relief.  She reports feeling "fullness" throughout her chest and notes swelling to her fingers and ankles. She has not seen her cardiologist in some time.  Shortness of breath is aggravated by exertion and associated with a cough.  She denies fever, chills, hemoptysis, no known COVID exposures recently.    Last echocardiogram was 01/01/2019 that showed normal systolic function of the left ventricle with an EF of 60 to 65%   Past Medical History:  Diagnosis Date  . ADD (attention deficit disorder with hyperactivity)   . Anxiety   . Bipolar 1 disorder (HCC)   . Carotid artery disease (HCC)    Right 50-69% stenosis by doppler 2010  . Chronic diastolic (congestive) heart failure (HCC)   . Coronary artery disease    a. s/p CABG in 2009 with LIMA-LAD, SVG-RI, and SVG-RCA b. low-risk NST in 10/2015 c. low-risk NST in 12/2018.  Marland Kitchen CVA (cerebral infarction) 12/2008   Reports a neurologist told her she had a blood clot in her brain  . Depression   . Emphysema of lung (HCC) 06/19/2018  . Fibromyalgia   . GERD (gastroesophageal reflux disease)   . Hyperlipidemia   . Hypertension   . OSA on CPAP 12/31/2018  . Tobacco abuse     Patient Active Problem List   Diagnosis Date Noted  . Chest pain 12/31/2018  . Anxiety 12/31/2018  . OSA on CPAP 12/31/2018  . Chronic diastolic CHF (congestive heart failure)  (HCC) 12/31/2018  . PTSD (post-traumatic stress disorder) 07/21/2018  . Emphysema of lung (HCC) 06/19/2018  . GAD (generalized anxiety disorder) 06/19/2018  . Panic disorder 06/19/2018  . CAD (coronary artery disease) 03/01/2012  . Chest pain 09/18/2011  . Tobacco abuse 09/18/2011  . CEREBROVASCULAR DISEASE 10/07/2009  . Mood disorder in conditions classified elsewhere 09/23/2009  . TOBACCO ABUSE 09/23/2009  . RESTLESS LEG SYNDROME 09/23/2009  . Essential hypertension 09/23/2009  . GERD 09/23/2009  . FIBROMYALGIA 09/23/2009    Past Surgical History:  Procedure Laterality Date  . CARDIAC CATHETERIZATION    . CORONARY ARTERY BYPASS GRAFT  2009   3V CABG LIMA --> LAD, SVG --> Ramus Intermediate,  SVG --> RCA  . TUBAL LIGATION  1990     OB History    Gravida  2   Para  2   Term  2   Preterm      AB      Living  2     SAB      TAB      Ectopic      Multiple      Live Births              Family History  Problem Relation Age of Onset  . Intellectual disability Maternal Aunt   . Intellectual disability Maternal Uncle   .  Alcohol abuse Paternal Grandfather     Social History   Tobacco Use  . Smoking status: Former Smoker    Packs/day: 0.50    Years: 35.00    Pack years: 17.50    Types: Cigarettes    Start date: 12/14/1981    Quit date: 12/17/2018    Years since quitting: 0.8  . Smokeless tobacco: Never Used  Substance Use Topics  . Alcohol use: Not Currently  . Drug use: No    Home Medications Prior to Admission medications   Medication Sig Start Date End Date Taking? Authorizing Provider  ALPRAZolam Prudy Feeler) 0.5 MG tablet Take 0.5 mg by mouth 3 (three) times daily as needed. For anxiety  ** [employer]  90 DAY SUPPLY**    [provider]  amLODipine (NORVASC) 10 MG tablet Take 1 tablet (10 mg total) by mouth daily. 09/05/18   Laqueta Linden, MD  ARIPiprazole (ABILIFY) 10 MG tablet Take 1 tablet (10 mg total) by mouth daily.  09/20/18   Neysa Hotter, MD  aspirin EC 81 MG tablet Take 81 mg by mouth daily.    [provider]  benzonatate (TESSALON) 100 MG capsule Take 1 capsule (100 mg total) by mouth 3 (three) times daily as needed for cough. 10/23/18   Loren Racer, MD  budesonide-formoterol [facility]) 160-4.5 MCG/ACT inhaler Inhale 2 puffs into the lungs 2 (two) times daily.    [provider]  busPIRone (BUSPAR) 15 MG tablet Take 1 tablet (15 mg total) by mouth 3 (three) times daily. 09/20/18   Neysa Hotter, MD  cyclobenzaprine (FLEXERIL) 10 MG tablet Take 10 mg by mouth 2 (two) times daily as needed for muscle spasms.     [provider]  diphenoxylate-atropine (LOMOTIL) 2.5-0.025 MG tablet Take 2.5 mg by mouth daily as needed.     [provider]  esomeprazole (NEXIUM) 40 MG capsule Take 40 mg by mouth every morning.    [provider]  FLUoxetine (PROZAC) 40 MG capsule Take 2 capsules (80 mg total) by mouth daily. 09/20/18   Neysa Hotter, MD  furosemide (LASIX) 40 MG tablet Take 1 tablet (40 mg total) by mouth daily. 01/02/19   Erick Blinks, MD  gabapentin (NEURONTIN) 300 MG capsule Take 1 capsule by mouth 4 (four) times daily. 11/20/18   [provider]  nitroGLYCERIN (NITROSTAT) 0.4 MG SL tablet Place 1 tablet (0.4 mg total) under the tongue every 5 (five) minutes x 3 doses as needed for chest pain (if no relief after 3rd dose, proceed to the ED for an evaluation). 11/21/17   Laqueta Linden, MD  potassium chloride (KLOR-CON) 20 MEQ packet Take 20 mEq by mouth 2 (two) times daily. 12/06/18   Laqueta Linden, MD  prazosin (MINIPRESS) 1 MG capsule Take 3 capsules (3 mg total) by mouth at bedtime. Patient taking differently: Take 1-3 mg by mouth at bedtime.  09/20/18   Neysa Hotter, MD  PROAIR HFA 108 [phone] Base) MCG/ACT inhaler Inhale 2 puffs into the lungs every 6 (six) hours as needed for wheezing or shortness of breath.  08/22/15   [provider]  ranolazine (RANEXA) 500 MG 12 hr tablet Take 1 tablet (500 mg total) by mouth 2 (two) times daily. 07/07/18   Laqueta Linden, MD  rOPINIRole (REQUIP) 1 MG tablet Take 1 mg by mouth at bedtime as needed (for restless leg).     [provider]  rosuvastatin (CRESTOR) 5 MG tablet Take 1 tablet (5  mg total) by mouth daily. 03/23/16   Laqueta LindenKoneswaran, Suresh A, MD  topiramate (TOPAMAX) 50 MG tablet Take 50 mg by mouth daily.     [provider]  Vitamin D, Ergocalciferol, (DRISDOL) 1.25 MG (50000 UT) CAPS capsule Take 50,000 Units by mouth once a week. 10/23/18   [provider]  zolpidem (AMBIEN) 10 MG tablet Take 1 tablet by mouth at bedtime as needed. 11/23/18   [provider]    Allergies    Imdur [isosorbide nitrate]  Review of Systems   Review of Systems  Constitutional: Negative for chills and fever.  HENT: Negative for congestion, sore throat and trouble swallowing.   Respiratory: Positive for shortness of breath.   Cardiovascular: Positive for leg swelling. Negative for chest pain.  Gastrointestinal: Negative for abdominal pain, diarrhea, nausea and vomiting.  Genitourinary: Negative for difficulty urinating, dysuria, flank pain and frequency.  Musculoskeletal: Negative for arthralgias.  Skin: Negative for color change.  Neurological: Negative for dizziness, weakness, numbness and headaches.    Physical Exam Updated Vital Signs BP (!) 170/80   Pulse 69   Temp 98.2 F (36.8 C)   Resp 18   Ht 5\' 2"  (1.575 m)   Wt 72.6 kg   LMP 10/22/2015   SpO2 99%   BMI 29.26 kg/m   Physical Exam Vitals and nursing note reviewed.  Constitutional:      General: She is not in acute distress.    Appearance: Normal appearance. She is well-developed.  HENT:     Head: Normocephalic and atraumatic.     Mouth/Throat:     Mouth: Mucous membranes are moist.     Pharynx: Uvula midline. No oropharyngeal exudate.  Neck:     Trachea: Phonation  normal.  Cardiovascular:     Rate and Rhythm: Normal rate and regular rhythm.     Pulses: Normal pulses.     Heart sounds: No murmur.  Pulmonary:     Effort: Pulmonary effort is normal. No respiratory distress.     Breath sounds: Normal breath sounds. No stridor. No rales.     Comments: Lung sounds are clear to auscultation bilaterally. Chest:     Chest wall: No tenderness.  Abdominal:     General: There is no distension.     Palpations: Abdomen is soft.     Tenderness: There is no abdominal tenderness.  Musculoskeletal:        General: No swelling.     Cervical back: Full passive range of motion without pain, normal range of motion and neck supple.     Right lower leg: No edema.     Left lower leg: No edema.     Comments: No appreciable edema of the bilateral lower extremities.  Lymphadenopathy:     Cervical: No cervical adenopathy.  Skin:    General: Skin is warm and dry.     Capillary Refill: Capillary refill takes less than 2 seconds.  Neurological:     Mental Status: She is alert and oriented to person, place, and time.     Motor: No abnormal muscle tone.     Coordination: Coordination normal.     ED Results / Procedures / Treatments   Labs (all labs ordered are listed, but only abnormal results are displayed) Labs Reviewed  BASIC METABOLIC PANEL - Abnormal; Notable for the following components:      Result Value   Glucose, Bld 110 (*)    All other components within normal limits  CBC - Abnormal;  Notable for the following components:   WBC 13.9 (*)    Platelets 525 (*)    All other components within normal limits  D-DIMER, QUANTITATIVE (NOT AT [facility]) - Abnormal; Notable for the following components:   D-Dimer, Quant 0.61 (*)    All other components within normal limits  NOVEL CORONAVIRUS, NAA (HOSP ORDER, SEND-OUT TO REF LAB; TAT 18-24 HRS)  BRAIN NATRIURETIC PEPTIDE  POC SARS CORONAVIRUS 2 AG -  ED  TROPONIN I (HIGH SENSITIVITY)  TROPONIN I (HIGH SENSITIVITY)      EKG EKG Interpretation  Date/Time:  Sunday October 07 2019 10:33:47 EST Ventricular Rate:  69 PR Interval:    QRS Duration: 94 QT Interval:  445 QTC Calculation: 477 R Axis:   88 Text Interpretation: Sinus rhythm Abnrm T, consider ischemia, anterolateral lds Confirmed by Bethann Berkshire [id number]) on 10/07/2019 10:39:51 AM Also confirmed by Bethann Berkshire [phone])  on 10/07/2019 3:21:16 PM   Radiology CT Angio Chest PE W and/or Wo Contrast  Result Date: 10/07/2019 CLINICAL DATA:  Per ED note. Pt reports central chest pressure with radiation to back and SHOB. Hx of triple bypass,CHF. Hx of smoking,HTN,HLD. Family Hx of early MI. EXAM: CT ANGIOGRAPHY CHEST WITH CONTRAST TECHNIQUE: Multidetector CT imaging of the chest was performed using the standard protocol during bolus administration of intravenous contrast. Multiplanar CT image reconstructions and MIPs were obtained to evaluate the vascular anatomy. CONTRAST:  OMNIPAQUE IOHEXOL 350 MG/ML SOLN COMPARISON:  12/31/2018 FINDINGS: Cardiovascular: There is satisfactory opacification of the pulmonary arteries to the segmental level. There is no evidence of a pulmonary embolism. Heart is normal in size. No pericardial effusion. There are changes from prior CABG surgery as well as coronary artery calcifications. Thoracic aorta is normal in caliber. There is a focus of protruding plaque or mural thrombus along the superior aortic arch just distal to the left subclavian origin, which is stable compared to the prior CTA. No penetrating ulcer. No aortic dissection. Mediastinum/Nodes: Subcentimeter thyroid nodules. No neck base, axillary, mediastinal or hilar masses or enlarged lymph nodes. Trachea and esophagus are unremarkable. Lungs/Pleura: Mild centrilobular emphysema. Mild pleuroparenchymal scarring at the apices. Lungs otherwise clear. No pleural effusion or pneumothorax. Upper Abdomen: No acute abnormality. Musculoskeletal: No fracture or acute  finding.  No bone lesion. Review of the MIP images confirms the above findings. IMPRESSION: 1. No evidence of a pulmonary embolism. 2. No acute findings. 3. Coronary artery disease and aortic atherosclerosis. 4. Mild centrilobular emphysema. Aortic Atherosclerosis (ICD10-I70.0) and Emphysema (ICD10-J43.9). Electronically Signed   By: Amie Portland M.D.   On: 10/07/2019 14:46   DG Chest Port 1 View  Result Date: 10/07/2019 CLINICAL DATA:  Pt reports central chest pressure with radiation to back and SOB. Hx of triple bypass,CHF EXAM: PORTABLE CHEST 1 VIEW COMPARISON:  12/31/2018 FINDINGS: Stable changes from prior CABG surgery. Cardiac silhouette top-normal in size. No mediastinal or hilar masses. No evidence of adenopathy. Lungs are hyperexpanded, but clear. No pleural effusion or pneumothorax. Skeletal structures are grossly intact. IMPRESSION: No acute cardiopulmonary disease. Electronically Signed   By: Amie Portland M.D.   On: 10/07/2019 11:32    Procedures Procedures (including critical care time)  Medications Ordered in ED Medications - No data to display  ED Course  I have reviewed the triage vital signs and the nursing notes.  Pertinent labs & imaging results that were available during my care of the patient were reviewed by me and considered in my medical decision making (see chart  for details).    MDM Rules/Calculators/A&P     CHA2DS2/VAS Stroke Risk Points      N/A >= 2 Points: High Risk  1 - 1.99 Points: Medium Risk  0 Points: Low Risk    A final score could not be computed because of missing components.: Last  Change: N/A     This score determines the patient's risk of having a stroke if the  patient has atrial fibrillation.      This score is not applicable to this patient. Components are not  calculated.                     Pt also seen by Dr. Roderic Palau and care plan discussed.  Pt does have a cardiac hx, work up today is reassuring.  Source of her dyspnea is unclear.   She has not seen her cardiologist since her echo back in March.  Pt was offered admission today, but declines due to COVID concerns.  She agrees to close cards f/u tomorrow.  Strict return precautions given.     Final Clinical Impression(s) / ED Diagnoses Final diagnoses:  Shortness of breath    Rx / DC Orders ED Discharge Orders    None       Kem Parkinson, PA-C 10/07/19 1603    Milton Ferguson, MD 10/09/19 1031

## 2019-10-07 NOTE — Discharge Instructions (Addendum)
Call your cardiologist tomorrow to arrange a follow-up appt.  Return to ER for any worsening symptoms

## 2019-10-07 NOTE — ED Notes (Signed)
Patient transported to CT 

## 2019-10-07 NOTE — ED Triage Notes (Signed)
Pt reports central chest pressure with radiation to back and SHOB. Hx of triple bypass,CHF. Hx of smoking,HTN,HLD. Family Hx of early MI. 7/10 pain

## 2019-10-08 ENCOUNTER — Telehealth: Payer: Self-pay | Admitting: Licensed Clinical Social Worker

## 2019-10-08 ENCOUNTER — Telehealth (INDEPENDENT_AMBULATORY_CARE_PROVIDER_SITE_OTHER): Payer: Managed Care, Other (non HMO) | Admitting: Cardiovascular Disease

## 2019-10-08 ENCOUNTER — Telehealth: Payer: Self-pay | Admitting: Cardiovascular Disease

## 2019-10-08 ENCOUNTER — Encounter: Payer: Self-pay | Admitting: Cardiovascular Disease

## 2019-10-08 VITALS — Ht 62.0 in | Wt 154.0 lb

## 2019-10-08 DIAGNOSIS — I5032 Chronic diastolic (congestive) heart failure: Secondary | ICD-10-CM

## 2019-10-08 DIAGNOSIS — R0789 Other chest pain: Secondary | ICD-10-CM

## 2019-10-08 DIAGNOSIS — I25708 Atherosclerosis of coronary artery bypass graft(s), unspecified, with other forms of angina pectoris: Secondary | ICD-10-CM

## 2019-10-08 DIAGNOSIS — G4733 Obstructive sleep apnea (adult) (pediatric): Secondary | ICD-10-CM

## 2019-10-08 DIAGNOSIS — I7410 Embolism and thrombosis of unspecified parts of aorta: Secondary | ICD-10-CM

## 2019-10-08 DIAGNOSIS — I10 Essential (primary) hypertension: Secondary | ICD-10-CM

## 2019-10-08 DIAGNOSIS — R0600 Dyspnea, unspecified: Secondary | ICD-10-CM

## 2019-10-08 DIAGNOSIS — E785 Hyperlipidemia, unspecified: Secondary | ICD-10-CM

## 2019-10-08 DIAGNOSIS — R0609 Other forms of dyspnea: Secondary | ICD-10-CM

## 2019-10-08 LAB — NOVEL CORONAVIRUS, NAA (HOSP ORDER, SEND-OUT TO REF LAB; TAT 18-24 HRS): SARS-CoV-2, NAA: NOT DETECTED

## 2019-10-08 MED ORDER — RANOLAZINE ER 1000 MG PO TB12: 1000.0000 mg | ORAL_TABLET | Freq: Two times a day (BID) | ORAL | 3 refills | Status: DC

## 2019-10-08 NOTE — Addendum Note (Signed)
Addended by: Barbarann Ehlers A on: 10/08/2019 10:07 AM   Modules accepted: Orders

## 2019-10-08 NOTE — Patient Instructions (Signed)
Medication Instructions:  INCREASE Ranexa to 1,000 mg twice a day  *If you need a refill on your cardiac medications before your next appointment, please call your pharmacy*  Lab Work: None today If you have labs (blood work) drawn today and your tests are completely normal, you will receive your results only by: Marland Kitchen MyChart Message (if you have MyChart) OR . A paper copy in the mail If you have any lab test that is abnormal or we need to change your treatment, we will call you to review the results.  Testing/Procedures: Your physician has requested that you have an echocardiogram. Echocardiography is a painless test that uses sound waves to create images of your heart. It provides your doctor with information about the size and shape of your heart and how well your heart's chambers and valves are working. This procedure takes approximately one hour. There are no restrictions for this procedure.    Follow-Up: At [facility], you and your health needs are our priority.  As part of our continuing mission to provide you with exceptional heart care, we have created designated Provider Care Teams.  These Care Teams include your primary Cardiologist (physician) and Advanced Practice Providers (APPs -  Physician Assistants and Nurse Practitioners) who all work together to provide you with the care you need, when you need it.  Your next appointment:   1 month(s)  The format for your next appointment:   Virtual Visit   Provider:   Bernerd Pho, PA-C  Other Instructions None     Thank you for choosing [facility] !

## 2019-10-08 NOTE — Telephone Encounter (Signed)
CSW referred to assist patient with obtaining a scale and BP cuff. CSW contacted patient to inform scale and cuff will be delivered to home. Patient grateful for support and assistance. CSW available as needed. Jackie Chazz Philson, LCSW, CCSW-MCS [phone]  

## 2019-10-08 NOTE — Progress Notes (Signed)
Virtual Visit via Telephone Note   This visit type was conducted due to national recommendations for restrictions regarding the COVID-19 Pandemic (e.g. social distancing) in an effort to limit this patient's exposure and mitigate transmission in our community.  Due to her co-morbid illnesses, this patient is at least at moderate risk for complications without adequate follow up.  This format is felt to be most appropriate for this patient at this time.  The patient did not have access to video technology/had technical difficulties with video requiring transitioning to audio format only (telephone).  All issues noted in this document were discussed and addressed.  No physical exam could be performed with this format.  Please refer to the patient's chart for her  consent to telehealth for [facility].   Date:  10/08/2019   ID:  [name], DOB [birth year], MRN [id number]  Patient Location: Home Provider Location: Office  PCP:  [facility]  Cardiologist:  Kate Sable, MD  Electrophysiologist:  None   Evaluation Performed:  Follow-Up Visit  Chief Complaint:  Chest pain  History of Present Illness:    [name] is a 53 y.o. female with coronary artery disease and chest pain.  She was evaluated in the ED for chest pain yesterday.  I reviewed all relevant documentation, labs and studies.  BP was 170/80 with a pulse of 69.  CT angiography showed no evidence of pulmonary embolism.  Chest x-ray showed no acute cardiopulmonary disease.  2 high-sensitivity troponins were normal.  White blood cells and D-dimer were mildly elevated.  BNP was normal.  Impression reviewed ECG performed yesterday which demonstrated sinus rhythm with no acute ST segment or T wave abnormalities.  Past medical history includes CAD (s/p CABG in 2009 with LIMA-LAD, SVG-RI, and SVG-RCA, low-risk NST in 10/2015), COPD, HTN, HLD, OSA (on CPAP) and Bipolar Disorder.  She has been  experiencing sensation of chest fullness and dyspnea with exertion over the past week.  She has had some occasional ankle swelling.  Symptoms prior to CABG were also that of chest fullness.  She describes exertional fatigue.  She does not have a blood pressure cuff at home.    Past Medical History:  Diagnosis Date  . ADD (attention deficit disorder with hyperactivity)   . Anxiety   . Bipolar 1 disorder ([place])   . Carotid artery disease ([place])    Right 50-69% stenosis by doppler 2010  . Chronic diastolic (congestive) heart failure ([place])   . Coronary artery disease    a. s/p CABG in 2009 with LIMA-LAD, SVG-RI, and SVG-RCA b. low-risk NST in 10/2015 c. low-risk NST in 12/2018.  Marland Kitchen CVA (cerebral infarction) 12/2008   Reports a neurologist told her she had a blood clot in her brain  . Depression   . Emphysema of lung ([place]) 06/19/2018  . Fibromyalgia   . GERD (gastroesophageal reflux disease)   . Hyperlipidemia   . Hypertension   . OSA on CPAP 12/31/2018  . Tobacco abuse    Past Surgical History:  Procedure Laterality Date  . CARDIAC CATHETERIZATION    . CORONARY ARTERY BYPASS GRAFT  2009   3V CABG LIMA --> LAD, SVG --> [place] Intermediate,  SVG --> RCA  . TUBAL LIGATION  1990     Current Meds  Medication Sig  . ALPRAZolam (XANAX) 0.5 MG tablet Take 0.5 mg by mouth 3 (three) times daily as needed. For anxiety  ** EXPRESS SCRIPTS  90 DAY SUPPLY**  . amLODipine (  NORVASC) 10 MG tablet Take 1 tablet (10 mg total) by mouth daily.  Marland Kitchen aspirin EC 81 MG tablet Take 81 mg by mouth daily.  . budesonide-formoterol (SYMBICORT) 160-4.5 MCG/ACT inhaler Inhale 2 puffs into the lungs 2 (two) times daily.  . cyclobenzaprine (FLEXERIL) 10 MG tablet Take 10 mg by mouth 2 (two) times daily as needed for muscle spasms.   . diphenoxylate-atropine (LOMOTIL) 2.5-0.025 MG tablet Take 2.5 mg by mouth daily as needed.   Marland Kitchen esomeprazole (NEXIUM) 40 MG capsule Take 40 mg by mouth every morning.  Marland Kitchen FLUoxetine  (PROZAC) 40 MG capsule Take 2 capsules (80 mg total) by mouth daily.  . furosemide (LASIX) 40 MG tablet Take 1 tablet (40 mg total) by mouth daily.  Marland Kitchen gabapentin (NEURONTIN) 300 MG capsule Take 1 capsule by mouth 4 (four) times daily.  . nitroGLYCERIN (NITROSTAT) 0.4 MG SL tablet Place 1 tablet (0.4 mg total) under the tongue every 5 (five) minutes x 3 doses as needed for chest pain (if no relief after 3rd dose, proceed to the ED for an evaluation).  . potassium chloride (KLOR-CON) 20 MEQ packet Take 20 mEq by mouth 2 (two) times daily.  Marland Kitchen PROAIR HFA 108 (90 Base) MCG/ACT inhaler Inhale 2 puffs into the lungs every 6 (six) hours as needed for wheezing or shortness of breath.   . ranolazine (RANEXA) 500 MG 12 hr tablet Take 1 tablet (500 mg total) by mouth 2 (two) times daily.  Marland Kitchen rOPINIRole (REQUIP) 1 MG tablet Take 1 mg by mouth at bedtime as needed (for restless leg).   . rosuvastatin (CRESTOR) 5 MG tablet Take 1 tablet (5 mg total) by mouth daily.  . Vitamin D, Ergocalciferol, (DRISDOL) 1.25 MG (50000 UT) CAPS capsule Take 50,000 Units by mouth once a week.  . zolpidem (AMBIEN) 10 MG tablet Take 1 tablet by mouth at bedtime as needed.     Allergies:   Imdur [isosorbide nitrate]   Social History   Tobacco Use  . Smoking status: Former Smoker    Packs/day: 0.50    Years: 35.00    Pack years: 17.50    Types: Cigarettes    Start date: 12/14/1981    Quit date: 12/17/2018    Years since quitting: 0.8  . Smokeless tobacco: Never Used  Substance Use Topics  . Alcohol use: Not Currently  . Drug use: No     Family Hx: The patient's family history includes Alcohol abuse in her paternal grandfather; CAD in her mother; Intellectual disability in her maternal aunt and maternal uncle.  ROS:   Please see the history of present illness.     All other systems reviewed and are negative.   Prior CV studies:   The following studies were reviewed today:  Echocardiogram: 12/2018  IMPRESSIONS    1. The left ventricle has normal systolic function with an ejection fraction of 60-65%. The cavity size was normal. Left ventricular diastolic parameters were normal No evidence of left ventricular regional wall motion abnormalities. 2. The mitral valve is normal in structure. 3. The tricuspid valve is normal in structure. 4. The aortic valve is tricuspid. 5. The aortic root is normal in size and structure. 6. No evidence of left ventricular regional wall motion abnormalities.  NST: 01/09/2019  Blood pressure demonstrated a hypertensive response to exercise.  Horizontal ST segment depression ST segment depression of 0.5 mm was noted during recovery in the II, III, aVF, V5 and V6 leads. The patient was asymptomatic.  The  study is normal. No perfusion defects consistent with myocardial ischemia or scar.  This is a low risk study.  Nuclear stress EF: 71%.  Labs/Other Tests and Data Reviewed:    EKG:  ECG reviewed above  Recent Labs: 12/31/2018: TSH 1.865 01/02/2019: Magnesium 2.1 10/07/2019: B Natriuretic Peptide 85.0; BUN 13; Creatinine, Ser 0.59; Hemoglobin 12.1; Platelets 525; Potassium 3.5; Sodium 138   Recent Lipid Panel Lab Results  Component Value Date/Time   CHOL 178 09/29/2009 08:37 PM   TRIG 185 (H) 09/29/2009 08:37 PM   HDL 43 09/29/2009 08:37 PM   CHOLHDL 4.1 Ratio 09/29/2009 08:37 PM   LDLCALC 98 09/29/2009 08:37 PM    Wt Readings from Last 3 Encounters:  10/08/19 154 lb (69.9 kg)  10/07/19 160 lb (72.6 kg)  02/02/19 149 lb (67.6 kg)     Objective:    Vital Signs:  Ht 5\' 2"  (1.575 m)   Wt 154 lb (69.9 kg)   LMP 10/22/2015   BMI 28.17 kg/m    VITAL SIGNS:  reviewed  ASSESSMENT & PLAN:    1.  Coronary artery disease: She complains of sensations of chest fullness and exertional dyspnea.  Low risk nuclear stress test in March 2020.  No significant findings by testing performed in the ED.  Continue current medical therapy with aspirin, amlodipine,  Ranexa (I will increase to 1000 mg twice daily), and statin therapy.  Not on beta-blockers due to history of bradycardia.  Intolerant to Imdur. I will obtain an a follow-up echocardiogram to assess for interval changes in cardiac structure and function. If symptoms persist in spite of adequately controlled blood pressure, I would not rule out the possibility of right and left heart catheterization/coronary angiography.  2.  Chronic diastolic heart failure: Continue Lasix 40 mg daily.  Chest x-ray and BNP were normal on 10/07/2019.  3.  Hypertension: She does not have a blood pressure cuff at home.  It was elevated yesterday.  We will have a blood pressure cuff sent to her.  Continue present therapy.  4.  Hyperlipidemia: Continue rosuvastatin.  5.  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  There is no evidence of penetrating ulcer or dissection.  Continue aspirin.  6.  Obstructive sleep apnea: Encouraged to use CPAP.  She uses it intermittently.   COVID-19 Education: The signs and symptoms of COVID-19 were discussed with the patient and how to seek care for testing (follow up with PCP or arrange E-visit).  The importance of social distancing was discussed today.  Time:   Today, I have spent 25 minutes with the patient with telehealth technology discussing the above problems.     Medication Adjustments/Labs and Tests Ordered: Current medicines are reviewed at length with the patient today.  Concerns regarding medicines are outlined above.   Tests Ordered: No orders of the defined types were placed in this encounter.   Medication Changes: No orders of the defined types were placed in this encounter.   Follow Up:  Virtual Visit  in 1 month(s)  Signed, Prentice DockerSuresh Marya Lowden, MD  10/08/2019 9:39 AM     [facility]

## 2019-10-08 NOTE — Telephone Encounter (Signed)
  Precert needed for: Echo   Location: [facility]   Date: Dec 18,2020

## 2019-10-12 ENCOUNTER — Ambulatory Visit (HOSPITAL_COMMUNITY): Admission: RE | Admit: 2019-10-12 | Payer: Managed Care, Other (non HMO) | Source: Ambulatory Visit

## 2019-10-17 ENCOUNTER — Telehealth: Payer: Self-pay | Admitting: Licensed Clinical Social Worker

## 2019-10-17 NOTE — Telephone Encounter (Signed)
CSW received message that cuff and scale ordered last week were undeliverable. CSW contacted patient to confirm address and resend. Message left for return call. Raquel Sarna, [place], [place]

## 2019-10-22 ENCOUNTER — Telehealth: Payer: Self-pay | Admitting: Licensed Clinical Social Worker

## 2019-10-22 NOTE — Telephone Encounter (Signed)
CSW attempted again to reach patient to confirm address with message left. Raquel Sarna, [place], [place]

## 2019-10-25 ENCOUNTER — Telehealth: Payer: Self-pay | Admitting: Licensed Clinical Social Worker

## 2019-10-25 NOTE — Telephone Encounter (Signed)
CSW referred to assist patient with obtaining a scale and a BP cuff which initially was undelieverable. Patient returned call confirming address and informed scale and cuff will be delivered to home. Patient grateful for support and assistance. CSW available as needed. Raquel Sarna, [place], [place]

## 2019-11-08 ENCOUNTER — Telehealth: Payer: Managed Care, Other (non HMO) | Admitting: Student

## 2020-01-02 DIAGNOSIS — F4310 Post-traumatic stress disorder, unspecified: Secondary | ICD-10-CM

## 2020-03-17 ENCOUNTER — Ambulatory Visit (INDEPENDENT_AMBULATORY_CARE_PROVIDER_SITE_OTHER): Payer: BC Managed Care – PPO | Admitting: Adult Health

## 2020-03-17 ENCOUNTER — Other Ambulatory Visit: Payer: Self-pay

## 2020-03-17 ENCOUNTER — Encounter: Payer: Self-pay | Admitting: Adult Health

## 2020-03-17 VITALS — BP 144/74 | HR 61 | Ht 62.0 in | Wt 146.0 lb

## 2020-03-17 DIAGNOSIS — F4310 Post-traumatic stress disorder, unspecified: Secondary | ICD-10-CM

## 2020-03-17 DIAGNOSIS — F331 Major depressive disorder, recurrent, moderate: Secondary | ICD-10-CM

## 2020-03-17 DIAGNOSIS — F411 Generalized anxiety disorder: Secondary | ICD-10-CM | POA: Diagnosis not present

## 2020-03-17 DIAGNOSIS — F909 Attention-deficit hyperactivity disorder, unspecified type: Secondary | ICD-10-CM

## 2020-03-17 DIAGNOSIS — G4700 Insomnia, unspecified: Secondary | ICD-10-CM

## 2020-03-17 DIAGNOSIS — F319 Bipolar disorder, unspecified: Secondary | ICD-10-CM

## 2020-03-17 MED ORDER — ALPRAZOLAM 0.5 MG PO TABS: 0.5000 mg | ORAL_TABLET | Freq: Three times a day (TID) | ORAL | 2 refills | Status: DC | PRN

## 2020-03-17 MED ORDER — ARIPIPRAZOLE 10 MG PO TABS: 10.0000 mg | ORAL_TABLET | Freq: Every day | ORAL | 1 refills | Status: DC

## 2020-03-17 MED ORDER — FLUOXETINE HCL 40 MG PO CAPS: 80.0000 mg | ORAL_CAPSULE | Freq: Every day | ORAL | 1 refills | Status: DC

## 2020-03-17 MED ORDER — BUSPIRONE HCL 15 MG PO TABS: 15.0000 mg | ORAL_TABLET | Freq: Three times a day (TID) | ORAL | 1 refills | Status: DC

## 2020-03-17 NOTE — Progress Notes (Signed)
[name] [id number] 05-Apr-1966 54 y.o.  Subjective:   Patient ID:  [name] is a 54 y.o. (DOB 04-11-1966) female.  Chief Complaint: No chief complaint on file.   HPI [name] presents to the office today for follow-up of Bipolar 1 disorder, ADHD, MDD, GAD, PTSD, and insomnia.  Describes mood today as "ok". Pleasant. Mood symptoms - reports depression, anxiety, and irritability - "most of the time". Stating "some days are worse than others". Also stating "I just want to be normal". Has been on same medication regimen for a "long time". Stating "I feel like they have worked well for a while, but now I feel blah". Decreased interest and motivation - used to love to do crafts - "now I don't have any interests". Does like to "spend money". Husband currently takes care of finances. Diagnosed with Bipolar by previous provider. Multiple medical issues. Taking medications as prescribed.  Energy levels "low". Some days doesn't feel like doing anything". Active, does not have a regular exercise routine.  Enjoys some usual interests and activities. Married. Lives with husband of 6 years and 2 dogs 12 and 2. Has 2 grown children. Parents in [place]. Aunt local - "a second momma". Spending time with family. Appetite adequate. Weight gain - 146 pounds. Sleeps well most nights with Ambien - "I have to take that". Averages 4 to 5 hours. Napping sometimes during the day. Focus and concentration difficulties. Stating "I have always been that way". Completing tasks. Managing aspects of household. Working on disability benefits. Has applied for benefits on and off since age 54.  Denies SI or HI. Denies AH or VH. Stating "I hear things" every now and then.  Past trials of medication:Sertraline, Paxil, Fluoxetine, Lexapro, Wellbutrin, Abilify, Buspar  Review of Systems:  Review of Systems  Musculoskeletal: Negative for gait problem.  Neurological: Negative for tremors.  Psychiatric/Behavioral:        Please refer to HPI    Medications: I have reviewed the patient's current medications.  Current Outpatient Medications  Medication Sig Dispense Refill  . ALPRAZolam (XANAX) 0.5 MG tablet Take 1 tablet (0.5 mg total) by mouth 3 (three) times daily as needed. 90 tablet 2  . amLODipine (NORVASC) 10 MG tablet Take 1 tablet (10 mg total) by mouth daily. 90 tablet 3  . ARIPiprazole (ABILIFY) 10 MG tablet Take 1 tablet (10 mg total) by mouth daily. 90 tablet 1  . aspirin EC 81 MG tablet Take 81 mg by mouth daily.    . budesonide-formoterol (SYMBICORT) 160-4.5 MCG/ACT inhaler Inhale 2 puffs into the lungs 2 (two) times daily.    . busPIRone (BUSPAR) 15 MG tablet Take 1 tablet (15 mg total) by mouth 3 (three) times daily. 270 tablet 1  . cyclobenzaprine (FLEXERIL) 10 MG tablet Take 10 mg by mouth 2 (two) times daily as needed for muscle spasms.     . diphenoxylate-atropine (LOMOTIL) 2.5-0.025 MG tablet Take 2.5 mg by mouth daily as needed.     Marland Kitchen esomeprazole (NEXIUM) 40 MG capsule Take 40 mg by mouth every morning.    Marland Kitchen FLUoxetine (PROZAC) 40 MG capsule Take 2 capsules (80 mg total) by mouth daily. 180 capsule 1  . furosemide (LASIX) 40 MG tablet Take 1 tablet (40 mg total) by mouth daily. 30 tablet 0  . gabapentin (NEURONTIN) 300 MG capsule Take 1 capsule by mouth 4 (four) times daily.    Marland Kitchen ibuprofen (ADVIL) 800 MG tablet Take 800 mg by mouth every 8 (  eight) hours as needed.    . nitroGLYCERIN (NITROSTAT) 0.4 MG SL tablet Place 1 tablet (0.4 mg total) under the tongue every 5 (five) minutes x 3 doses as needed for chest pain (if no relief after 3rd dose, proceed to the ED for an evaluation). 25 tablet 3  . ondansetron (ZOFRAN) 4 MG tablet Take 4 mg by mouth every 8 (eight) hours as needed.    . potassium chloride (KLOR-CON) 20 MEQ packet Take 20 mEq by mouth 2 (two) times daily. 60 tablet 6  . PROAIR HFA 108 (90 Base) MCG/ACT inhaler Inhale 2 puffs into the lungs every 6 (six) hours as needed  for wheezing or shortness of breath.   3  . ranolazine (RANEXA) 1000 MG SR tablet Take 1 tablet (1,000 mg total) by mouth 2 (two) times daily. 180 tablet 3  . rOPINIRole (REQUIP) 1 MG tablet Take 1 mg by mouth at bedtime as needed (for restless leg).     . rosuvastatin (CRESTOR) 5 MG tablet Take 1 tablet (5 mg total) by mouth daily. 90 tablet 3  . Vitamin D, Ergocalciferol, (DRISDOL) 1.25 MG (50000 UT) CAPS capsule Take 50,000 Units by mouth once a week.    . zolpidem (AMBIEN) 10 MG tablet Take 1 tablet by mouth at bedtime as needed.     No current facility-administered medications for this visit.    Medication Side Effects: None  Allergies:  Allergies  Allergen Reactions  . Imdur [Isosorbide Nitrate] Other (See Comments)    Headaches; Worsening Chest Pain    Past Medical History:  Diagnosis Date  . ADD (attention deficit disorder with hyperactivity)   . Anxiety   . Bipolar 1 disorder ([facility])   . Carotid artery disease ([facility])    Right 50-69% stenosis by doppler 2010  . Chronic diastolic (congestive) heart failure ([facility])   . Coronary artery disease    a. s/p CABG in 2009 with LIMA-LAD, SVG-RI, and SVG-RCA b. low-risk NST in 10/2015 c. low-risk NST in 12/2018.  Marland Kitchen CVA (cerebral infarction) 12/2008   Reports a neurologist told her she had a blood clot in her brain  . Depression   . Emphysema of lung ([facility]) 06/19/2018  . Fibromyalgia   . GERD (gastroesophageal reflux disease)   . Hyperlipidemia   . Hypertension   . OSA on CPAP 12/31/2018  . Tobacco abuse     Family History  Problem Relation Age of Onset  . CAD Mother   . Intellectual disability Maternal Aunt   . Intellectual disability Maternal Uncle   . Alcohol abuse Paternal Grandfather     Social History   Socioeconomic History  . Marital status: Married    Spouse name: Not on file  . Number of children: Not on file  . Years of education: Not on file  . Highest education level: Not on file  Occupational History  .  Occupation: Unemployed    Comment: Applicatin for disability pending, inadequate funds to afford medical care  Tobacco Use  . Smoking status: Former Smoker    Packs/day: 0.50    Years: 35.00    Pack years: 17.50    Types: Cigarettes    Start date: 12/14/1981    Quit date: 12/17/2018    Years since quitting: 1.2  . Smokeless tobacco: Never Used  Substance and Sexual Activity  . Alcohol use: Not Currently  . Drug use: No  . Sexual activity: Yes  Other Topics Concern  . Not on file  Social History  Narrative   Divorced with 2 children   Social Determinants of Health   Financial Resource Strain:   . Difficulty of Paying Living Expenses:   Food Insecurity:   . Worried About Charity fundraiser in the Last Year:   . Arboriculturist in the Last Year:   Transportation Needs:   . Film/video editor (Medical):   Marland Kitchen Lack of Transportation (Non-Medical):   Physical Activity:   . Days of Exercise per Week:   . Minutes of Exercise per Session:   Stress:   . Feeling of Stress :   Social Connections:   . Frequency of Communication with Friends and Family:   . Frequency of Social Gatherings with Friends and Family:   . Attends Religious Services:   . Active Member of Clubs or Organizations:   . Attends Archivist Meetings:   Marland Kitchen Marital Status:   Intimate Partner Violence:   . Fear of Current or Ex-Partner:   . Emotionally Abused:   Marland Kitchen Physically Abused:   . Sexually Abused:     Past Medical History, Surgical history, Social history, and Family history were reviewed and updated as appropriate.   Please see review of systems for further details on the patient's review from today.   Objective:   Physical Exam:  BP (!) 144/74   Pulse 61   Ht 5\' 2"  (1.575 m)   Wt 146 lb (66.2 kg)   LMP 10/22/2015   BMI 26.70 kg/m   Physical Exam Constitutional:      General: She is not in acute distress. Musculoskeletal:        General: No deformity.  Neurological:     Mental  Status: She is alert and oriented to person, place, and time.     Coordination: Coordination normal.  Psychiatric:        Attention and Perception: Attention and perception normal. She does not perceive auditory or visual hallucinations.        Mood and Affect: Mood normal. Mood is not anxious or depressed. Affect is not labile, blunt, angry or inappropriate.        Speech: Speech normal.        Behavior: Behavior normal.        Thought Content: Thought content normal. Thought content is not paranoid or delusional. Thought content does not include homicidal or suicidal ideation. Thought content does not include homicidal or suicidal plan.        Cognition and Memory: Cognition and memory normal.        Judgment: Judgment normal.     Comments: Insight intact     Lab Review:     Component Value Date/Time   NA 138 10/07/2019 1040   K 3.5 10/07/2019 1040   CL 102 10/07/2019 1040   CO2 24 10/07/2019 1040   GLUCOSE 110 (H) 10/07/2019 1040   BUN 13 10/07/2019 1040   CREATININE 0.59 10/07/2019 1040   CALCIUM 9.5 10/07/2019 1040   PROT 6.7 03/01/2012 0450   ALBUMIN 2.9 (L) 03/01/2012 0450   AST 15 03/01/2012 0450   ALT 11 03/01/2012 0450   ALKPHOS 90 03/01/2012 0450   BILITOT 0.2 (L) 03/01/2012 0450   GFRNONAA >60 10/07/2019 1040   GFRAA >60 10/07/2019 1040       Component Value Date/Time   WBC 13.9 (H) 10/07/2019 1040   RBC 4.24 10/07/2019 1040   HGB 12.1 10/07/2019 1040   HCT 37.6 10/07/2019 1040   PLT 525 (H)  10/07/2019 1040   MCV 88.7 10/07/2019 1040   MCH 28.5 10/07/2019 1040   MCHC 32.2 10/07/2019 1040   RDW 14.8 10/07/2019 1040   LYMPHSABS 2.3 10/23/2018 1322   MONOABS 0.3 10/23/2018 1322   EOSABS 0.0 10/23/2018 1322   BASOSABS 0.0 10/23/2018 1322    No results found for: POCLITH, LITHIUM   No results found for: PHENYTOIN, PHENOBARB, VALPROATE, CBMZ   .res Assessment: Plan:    Plan:  PDMP reviewed  1. Continue Fluoxetine 80 mg daily  2. Continue Abilify  10 mg daily 3. Continue Buspar 15 mg three times a day  4. Continue Xanax 0.5mg  TID prn anxiety  Has seen a therapist in the past  RTC 4 weeks  Patient advised to contact office with any questions, adverse effects, or acute worsening in signs and symptoms.  Discussed potential benefits, risk, and side effects of benzodiazepines to include potential risk of tolerance and dependence, as well as possible drowsiness.  Advised patient not to drive if experiencing drowsiness and to take lowest possible effective dose to minimize risk of dependence and tolerance.  Discussed potential metabolic side effects associated with atypical antipsychotics, as well as potential risk for movement side effects. Advised pt to contact office if movement side effects occur.    Diagnoses and all orders for this visit:  Bipolar I disorder (HCC) -     ARIPiprazole (ABILIFY) 10 MG tablet; Take 1 tablet (10 mg total) by mouth daily.  Attention deficit hyperactivity disorder (ADHD), unspecified ADHD type  PTSD (post-traumatic stress disorder) -     FLUoxetine (PROZAC) 40 MG capsule; Take 2 capsules (80 mg total) by mouth daily.  Generalized anxiety disorder -     FLUoxetine (PROZAC) 40 MG capsule; Take 2 capsules (80 mg total) by mouth daily. -     ALPRAZolam (XANAX) 0.5 MG tablet; Take 1 tablet (0.5 mg total) by mouth 3 (three) times daily as needed. -     busPIRone (BUSPAR) 15 MG tablet; Take 1 tablet (15 mg total) by mouth 3 (three) times daily.  Major depressive disorder, recurrent episode, moderate (HCC) -     FLUoxetine (PROZAC) 40 MG capsule; Take 2 capsules (80 mg total) by mouth daily.  Insomnia, unspecified type     Please see After Visit Summary for patient specific instructions.  No future appointments.  No orders of the defined types were placed in this encounter.   -------------------------------

## 2020-04-14 ENCOUNTER — Ambulatory Visit: Payer: BC Managed Care – PPO | Admitting: Adult Health

## 2020-04-17 ENCOUNTER — Ambulatory Visit: Payer: BC Managed Care – PPO | Admitting: Psychiatry

## 2020-04-22 ENCOUNTER — Other Ambulatory Visit: Payer: Self-pay

## 2020-04-22 ENCOUNTER — Ambulatory Visit (INDEPENDENT_AMBULATORY_CARE_PROVIDER_SITE_OTHER): Payer: BC Managed Care – PPO | Admitting: Adult Health

## 2020-04-22 ENCOUNTER — Encounter: Payer: Self-pay | Admitting: Adult Health

## 2020-04-22 DIAGNOSIS — F411 Generalized anxiety disorder: Secondary | ICD-10-CM

## 2020-04-22 DIAGNOSIS — F319 Bipolar disorder, unspecified: Secondary | ICD-10-CM | POA: Diagnosis not present

## 2020-04-22 DIAGNOSIS — G4700 Insomnia, unspecified: Secondary | ICD-10-CM | POA: Diagnosis not present

## 2020-04-22 DIAGNOSIS — F331 Major depressive disorder, recurrent, moderate: Secondary | ICD-10-CM | POA: Diagnosis not present

## 2020-04-22 NOTE — Progress Notes (Signed)
[name] [id number] 08-18-1966 54 y.o.  Subjective:   Patient ID:  [name] is a 54 y.o. (DOB February 09, 1966) female.  Chief Complaint: No chief complaint on file.   HPI [name] presents to the office today for follow-up of Bipolar 1 disorder, ADHD, MDD, GAD, PTSD, and insomnia.  Describes mood today as "so-so". Pleasant. Mood symptoms - reports depression, anxiety, and irritability - "here lately right often". Having a lot of trouble with muscle cramps. Acid reflux worse. Stating "I can't even rest at night". Feels "wiped out and tired all the time". Plans to call PCP this morning for appointment. Decreased interest and motivation - "I have none". Does not want tot make any medication changes this visit. Taking medications as prescribed.  Energy levels "low". Active, does not have a regular exercise routine.  Enjoys some usual interests and activities. Married. Lives with husband of 6 years and 2 dogs 12 and 2. Has 2 grown children. Spending time with family. Appetite adequate. Weight gain - 146 pounds. Has not been able to sleep well for the past "couple" of weeks - "cramping and acid reflux". Averages 3 to 4 hours - "max". Trying to nap during the day.  Focus and concentration difficulties. Completing tasks. Managing aspects of household. Has applied for disability  Benefits. Denies SI or HI. Denies AH or VH. Stating "I think I see things".  Past trials of medication:Sertraline, Paxil, Fluoxetine, Lexapro, Wellbutrin, Abilify, Buspar    Review of Systems:  Review of Systems  Musculoskeletal: Negative for gait problem.  Neurological: Negative for tremors.  Psychiatric/Behavioral:       Please refer to HPI    Medications: I have reviewed the patient's current medications.  Current Outpatient Medications  Medication Sig Dispense Refill  . ALPRAZolam (XANAX) 0.5 MG tablet Take 1 tablet (0.5 mg total) by mouth 3 (three) times daily as needed. 90 tablet 2  .  amLODipine (NORVASC) 10 MG tablet Take 1 tablet (10 mg total) by mouth daily. 90 tablet 3  . ARIPiprazole (ABILIFY) 10 MG tablet Take 1 tablet (10 mg total) by mouth daily. 90 tablet 1  . aspirin EC 81 MG tablet Take 81 mg by mouth daily.    . budesonide-formoterol (SYMBICORT) 160-4.5 MCG/ACT inhaler Inhale 2 puffs into the lungs 2 (two) times daily.    . busPIRone (BUSPAR) 15 MG tablet Take 1 tablet (15 mg total) by mouth 3 (three) times daily. 270 tablet 1  . cyclobenzaprine (FLEXERIL) 10 MG tablet Take 10 mg by mouth 2 (two) times daily as needed for muscle spasms.     . diphenoxylate-atropine (LOMOTIL) 2.5-0.025 MG tablet Take 2.5 mg by mouth daily as needed.     Marland Kitchen esomeprazole (NEXIUM) 40 MG capsule Take 40 mg by mouth every morning.    Marland Kitchen FLUoxetine (PROZAC) 40 MG capsule Take 2 capsules (80 mg total) by mouth daily. 180 capsule 1  . furosemide (LASIX) 40 MG tablet Take 1 tablet (40 mg total) by mouth daily. 30 tablet 0  . gabapentin (NEURONTIN) 300 MG capsule Take 1 capsule by mouth 4 (four) times daily.    Marland Kitchen ibuprofen (ADVIL) 800 MG tablet Take 800 mg by mouth every 8 (eight) hours as needed.    . nitroGLYCERIN (NITROSTAT) 0.4 MG SL tablet Place 1 tablet (0.4 mg total) under the tongue every 5 (five) minutes x 3 doses as needed for chest pain (if no relief after 3rd dose, proceed to the ED for an evaluation). 25  tablet 3  . ondansetron (ZOFRAN) 4 MG tablet Take 4 mg by mouth every 8 (eight) hours as needed.    . potassium chloride (KLOR-CON) 20 MEQ packet Take 20 mEq by mouth 2 (two) times daily. 60 tablet 6  . PROAIR HFA 108 (90 Base) MCG/ACT inhaler Inhale 2 puffs into the lungs every 6 (six) hours as needed for wheezing or shortness of breath.   3  . ranolazine (RANEXA) 1000 MG SR tablet Take 1 tablet (1,000 mg total) by mouth 2 (two) times daily. 180 tablet 3  . rOPINIRole (REQUIP) 1 MG tablet Take 1 mg by mouth at bedtime as needed (for restless leg).     . rosuvastatin (CRESTOR) 5 MG  tablet Take 1 tablet (5 mg total) by mouth daily. 90 tablet 3  . Vitamin D, Ergocalciferol, (DRISDOL) 1.25 MG (50000 UT) CAPS capsule Take 50,000 Units by mouth once a week.    . zolpidem (AMBIEN) 10 MG tablet Take 1 tablet by mouth at bedtime as needed.     No current facility-administered medications for this visit.    Medication Side Effects: None  Allergies:  Allergies  Allergen Reactions  . Imdur [Isosorbide Nitrate] Other (See Comments)    Headaches; Worsening Chest Pain    Past Medical History:  Diagnosis Date  . ADD (attention deficit disorder with hyperactivity)   . Anxiety   . Bipolar 1 disorder (HCC)   . Carotid artery disease (HCC)    Right 50-69% stenosis by doppler 2010  . Chronic diastolic (congestive) heart failure (HCC)   . Coronary artery disease    a. s/p CABG in 2009 with LIMA-LAD, SVG-RI, and SVG-RCA b. low-risk NST in 10/2015 c. low-risk NST in 12/2018.  Marland Kitchen. CVA (cerebral infarction) 12/2008   Reports a neurologist told her she had a blood clot in her brain  . Depression   . Emphysema of lung (HCC) 06/19/2018  . Fibromyalgia   . GERD (gastroesophageal reflux disease)   . Hyperlipidemia   . Hypertension   . OSA on CPAP 12/31/2018  . Tobacco abuse     Family History  Problem Relation Age of Onset  . CAD Mother   . Intellectual disability Maternal Aunt   . Intellectual disability Maternal Uncle   . Alcohol abuse Paternal Grandfather     Social History   Socioeconomic History  . Marital status: Married    Spouse name: Not on file  . Number of children: Not on file  . Years of education: Not on file  . Highest education level: Not on file  Occupational History  . Occupation: Unemployed    Comment: Applicatin for disability pending, inadequate funds to afford medical care  Tobacco Use  . Smoking status: Former Smoker    Packs/day: 0.50    Years: 35.00    Pack years: 17.50    Types: Cigarettes    Start date: 12/14/1981    Quit date: 12/17/2018     Years since quitting: 1.3  . Smokeless tobacco: Never Used  Vaping Use  . Vaping Use: Former  . Substances: Flavoring  Substance and Sexual Activity  . Alcohol use: Not Currently  . Drug use: No  . Sexual activity: Yes  Other Topics Concern  . Not on file  Social History Narrative   Divorced with 2 children   Social Determinants of Health   Financial Resource Strain:   . Difficulty of Paying Living Expenses:   Food Insecurity:   . Worried About Radiation protection practitionerunning Out  of Food in the Last Year:   . Ran Out of Food in the Last Year:   Transportation Needs:   . Lack of Transportation (Medical):   Marland Kitchen Lack of Transportation (Non-Medical):   Physical Activity:   . Days of Exercise per Week:   . Minutes of Exercise per Session:   Stress:   . Feeling of Stress :   Social Connections:   . Frequency of Communication with Friends and Family:   . Frequency of Social Gatherings with Friends and Family:   . Attends Religious Services:   . Active Member of Clubs or Organizations:   . Attends Banker Meetings:   Marland Kitchen Marital Status:   Intimate Partner Violence:   . Fear of Current or Ex-Partner:   . Emotionally Abused:   Marland Kitchen Physically Abused:   . Sexually Abused:     Past Medical History, Surgical history, Social history, and Family history were reviewed and updated as appropriate.   Please see review of systems for further details on the patient's review from today.   Objective:   Physical Exam:  LMP 10/22/2015   Physical Exam Constitutional:      General: She is not in acute distress. Musculoskeletal:        General: No deformity.  Neurological:     Mental Status: She is alert and oriented to person, place, and time.     Coordination: Coordination normal.  Psychiatric:        Attention and Perception: Attention and perception normal. She does not perceive auditory or visual hallucinations.        Mood and Affect: Mood normal. Mood is not anxious or depressed. Affect is  not labile, blunt, angry or inappropriate.        Speech: Speech normal.        Behavior: Behavior normal.        Thought Content: Thought content normal. Thought content is not paranoid or delusional. Thought content does not include homicidal or suicidal ideation. Thought content does not include homicidal or suicidal plan.        Cognition and Memory: Cognition and memory normal.        Judgment: Judgment normal.     Comments: Insight intact     Lab Review:     Component Value Date/Time   NA 138 10/07/2019 1040   K 3.5 10/07/2019 1040   CL 102 10/07/2019 1040   CO2 24 10/07/2019 1040   GLUCOSE 110 (H) 10/07/2019 1040   BUN 13 10/07/2019 1040   CREATININE 0.59 10/07/2019 1040   CALCIUM 9.5 10/07/2019 1040   PROT 6.7 03/01/2012 0450   ALBUMIN 2.9 (L) 03/01/2012 0450   AST 15 03/01/2012 0450   ALT 11 03/01/2012 0450   ALKPHOS 90 03/01/2012 0450   BILITOT 0.2 (L) 03/01/2012 0450   GFRNONAA >60 10/07/2019 1040   GFRAA >60 10/07/2019 1040       Component Value Date/Time   WBC 13.9 (H) 10/07/2019 1040   RBC 4.24 10/07/2019 1040   HGB 12.1 10/07/2019 1040   HCT 37.6 10/07/2019 1040   PLT 525 (H) 10/07/2019 1040   MCV 88.7 10/07/2019 1040   MCH 28.5 10/07/2019 1040   MCHC 32.2 10/07/2019 1040   RDW 14.8 10/07/2019 1040   LYMPHSABS 2.3 10/23/2018 1322   MONOABS 0.3 10/23/2018 1322   EOSABS 0.0 10/23/2018 1322   BASOSABS 0.0 10/23/2018 1322    No results found for: POCLITH, LITHIUM   No results found for:  PHENYTOIN, PHENOBARB, VALPROATE, CBMZ   .res Assessment: Plan:    Plan:  PDMP reviewed  1. Continue Fluoxetine 80 mg daily  2. Continue Abilify 10 mg daily 3. Continue Buspar 15 mg three times a day  4. Continue Xanax 0.5mg  TID prn anxiety - not taking 3 times a day  Has seen a therapist in the past  RTC 4 weeks  Patient advised to contact office with any questions, adverse effects, or acute worsening in signs and symptoms.  Discussed potential  benefits, risk, and side effects of benzodiazepines to include potential risk of tolerance and dependence, as well as possible drowsiness.  Advised patient not to drive if experiencing drowsiness and to take lowest possible effective dose to minimize risk of dependence and tolerance.  Discussed potential metabolic side effects associated with atypical antipsychotics, as well as potential risk for movement side effects. Advised pt to contact office if movement side effects occur.    Diagnoses and all orders for this visit:  Bipolar I disorder (HCC)  Generalized anxiety disorder  Insomnia, unspecified type  Major depressive disorder, recurrent episode, moderate (HCC)     Please see After Visit Summary for patient specific instructions.  Future Appointments  Date Time Provider Department Center  04/30/2020  1:00 PM Mathis Fare, LCSW [facility] None  05/20/2020  1:20 PM Evangeline Utley, Thereasa Solo, NP [facility] None    No orders of the defined types were placed in this encounter.   -------------------------------

## 2020-04-29 ENCOUNTER — Ambulatory Visit (INDEPENDENT_AMBULATORY_CARE_PROVIDER_SITE_OTHER): Payer: BC Managed Care – PPO | Admitting: Cardiology

## 2020-04-29 ENCOUNTER — Other Ambulatory Visit: Payer: Self-pay

## 2020-04-29 ENCOUNTER — Encounter: Payer: Self-pay | Admitting: Cardiology

## 2020-04-29 VITALS — BP 132/74 | HR 55 | Ht 62.0 in | Wt 148.2 lb

## 2020-04-29 DIAGNOSIS — I2510 Atherosclerotic heart disease of native coronary artery without angina pectoris: Secondary | ICD-10-CM

## 2020-04-29 DIAGNOSIS — M79604 Pain in right leg: Secondary | ICD-10-CM | POA: Diagnosis not present

## 2020-04-29 DIAGNOSIS — R0602 Shortness of breath: Secondary | ICD-10-CM

## 2020-04-29 DIAGNOSIS — M79605 Pain in left leg: Secondary | ICD-10-CM

## 2020-04-29 NOTE — Patient Instructions (Signed)
Your physician recommends that you schedule a follow-up appointment in: 6 WEEKS WITH DR [facility]  Your physician recommends that you continue on your current medications as directed. Please refer to the Current Medication list given to you today.  Your physician has requested that you have an echocardiogram. Echocardiography is a painless test that uses sound waves to create images of your heart. It provides your doctor with information about the size and shape of your heart and how well your heart's chambers and valves are working. This procedure takes approximately one hour. There are no restrictions for this procedure.  Your physician has requested that you have an ankle brachial index (ABI). During this test an ultrasound and blood pressure cuff are used to evaluate the arteries that supply the arms and legs with blood. Allow thirty minutes for this exam. There are no restrictions or special instructions.   Thank you for choosing [facility]!!

## 2020-04-29 NOTE — Progress Notes (Signed)
Clinical Summary Ms. Piccirilli is a 54 y.o.female  1. CAD - prior CABG in 2009 with LIMA-LAD, SVG-RI, SVG-RCA - 12/2018 echo LVEF 60-65%, no significant abnormalities - 12/2018 nuclear stress: no ischemia -not on beta blockers due to bradycardia. Intolerant to imdur  - at last visit Dr Kirtland Bouchard had planned for echo due to DOE, does not appear this was completed.   - breathing worst since last visit. DOE just walking from waiting room - mild wheezing at times. No cough. Occasional LE edema - nonspecific chest fullness, no exertional chest pains     2. Leg pains - has bilateral leg pains at night, being treated for restless leg  - also has bilateral calf and thigh pains with walking   3. Chronic diastolic HF - occasioanal LE edema, compliant with diuretic  4. COPD - compliant with inhalers  5. HTN - she is compliant with bp meds  6. Hyperlipidemia - she is on statin  7. OSA on cpap   Past Medical History:  Diagnosis Date  . ADD (attention deficit disorder with hyperactivity)   . Anxiety   . Bipolar 1 disorder (HCC)   . Carotid artery disease (HCC)    Right 50-69% stenosis by doppler 2010  . Chronic diastolic (congestive) heart failure (HCC)   . Coronary artery disease    a. s/p CABG in 2009 with LIMA-LAD, SVG-RI, and SVG-RCA b. low-risk NST in 10/2015 c. low-risk NST in 12/2018.  Marland Kitchen CVA (cerebral infarction) 12/2008   Reports a neurologist told her she had a blood clot in her brain  . Depression   . Emphysema of lung (HCC) 06/19/2018  . Fibromyalgia   . GERD (gastroesophageal reflux disease)   . Hyperlipidemia   . Hypertension   . OSA on CPAP 12/31/2018  . Tobacco abuse      Allergies  Allergen Reactions  . Imdur [Isosorbide Nitrate] Other (See Comments)    Headaches; Worsening Chest Pain     Current Outpatient Medications  Medication Sig Dispense Refill  . ALPRAZolam (XANAX) 0.5 MG tablet Take 1 tablet (0.5 mg total) by mouth 3 (three) times daily as  needed. 90 tablet 2  . amLODipine (NORVASC) 10 MG tablet Take 1 tablet (10 mg total) by mouth daily. 90 tablet 3  . ARIPiprazole (ABILIFY) 10 MG tablet Take 1 tablet (10 mg total) by mouth daily. 90 tablet 1  . aspirin EC 81 MG tablet Take 81 mg by mouth daily.    . budesonide-formoterol (SYMBICORT) 160-4.5 MCG/ACT inhaler Inhale 2 puffs into the lungs 2 (two) times daily.    . busPIRone (BUSPAR) 15 MG tablet Take 1 tablet (15 mg total) by mouth 3 (three) times daily. 270 tablet 1  . cyclobenzaprine (FLEXERIL) 10 MG tablet Take 10 mg by mouth 2 (two) times daily as needed for muscle spasms.     . diphenoxylate-atropine (LOMOTIL) 2.5-0.025 MG tablet Take 2.5 mg by mouth daily as needed.     Marland Kitchen esomeprazole (NEXIUM) 40 MG capsule Take 40 mg by mouth every morning.    Marland Kitchen FLUoxetine (PROZAC) 40 MG capsule Take 2 capsules (80 mg total) by mouth daily. 180 capsule 1  . furosemide (LASIX) 40 MG tablet Take 1 tablet (40 mg total) by mouth daily. 30 tablet 0  . gabapentin (NEURONTIN) 300 MG capsule Take 1 capsule by mouth 4 (four) times daily.    Marland Kitchen ibuprofen (ADVIL) 800 MG tablet Take 800 mg by mouth every 8 (eight) hours as needed.    Marland Kitchen  nitroGLYCERIN (NITROSTAT) 0.4 MG SL tablet Place 1 tablet (0.4 mg total) under the tongue every 5 (five) minutes x 3 doses as needed for chest pain (if no relief after 3rd dose, proceed to the ED for an evaluation). 25 tablet 3  . ondansetron (ZOFRAN) 4 MG tablet Take 4 mg by mouth every 8 (eight) hours as needed.    . potassium chloride (KLOR-CON) 20 MEQ packet Take 20 mEq by mouth 2 (two) times daily. 60 tablet 6  . PROAIR HFA 108 (90 Base) MCG/ACT inhaler Inhale 2 puffs into the lungs every 6 (six) hours as needed for wheezing or shortness of breath.   3  . ranolazine (RANEXA) 1000 MG SR tablet Take 1 tablet (1,000 mg total) by mouth 2 (two) times daily. 180 tablet 3  . rOPINIRole (REQUIP) 1 MG tablet Take 1 mg by mouth at bedtime as needed (for restless leg).     .  rosuvastatin (CRESTOR) 5 MG tablet Take 1 tablet (5 mg total) by mouth daily. 90 tablet 3  . Vitamin D, Ergocalciferol, (DRISDOL) 1.25 MG (50000 UT) CAPS capsule Take 50,000 Units by mouth once a week.    . zolpidem (AMBIEN) 10 MG tablet Take 1 tablet by mouth at bedtime as needed.     No current facility-administered medications for this visit.     Past Surgical History:  Procedure Laterality Date  . CARDIAC CATHETERIZATION    . CORONARY ARTERY BYPASS GRAFT  2009   3V CABG LIMA --> LAD, SVG --> Ramus Intermediate,  SVG --> RCA  . TUBAL LIGATION  1990     Allergies  Allergen Reactions  . Imdur [Isosorbide Nitrate] Other (See Comments)    Headaches; Worsening Chest Pain      Family History  Problem Relation Age of Onset  . CAD Mother   . Intellectual disability Maternal Aunt   . Intellectual disability Maternal Uncle   . Alcohol abuse Paternal Grandfather      Social History Ms. Chrismer reports that she quit smoking about 16 months ago. Her smoking use included cigarettes. She started smoking about 38 years ago. She has a 17.50 pack-year smoking history. She has never used smokeless tobacco. Ms. Scheuermann reports previous alcohol use.   Review of Systems CONSTITUTIONAL: No weight loss, fever, chills, weakness or fatigue.  HEENT: Eyes: No visual loss, blurred vision, double vision or yellow sclerae.No hearing loss, sneezing, congestion, runny nose or sore throat.  SKIN: No rash or itching.  CARDIOVASCULAR: per hpi RESPIRATORY: No shortness of breath, cough or sputum.  GASTROINTESTINAL: No anorexia, nausea, vomiting or diarrhea. No abdominal pain or blood.  GENITOURINARY: No burning on urination, no polyuria NEUROLOGICAL: No headache, dizziness, syncope, paralysis, ataxia, numbness or tingling in the extremities. No change in bowel or bladder control.  MUSCULOSKELETAL: No muscle, back pain, joint pain or stiffness.  LYMPHATICS: No enlarged nodes. No history of splenectomy.   PSYCHIATRIC: No history of depression or anxiety.  ENDOCRINOLOGIC: No reports of sweating, cold or heat intolerance. No polyuria or polydipsia.  Marland Kitchen   Physical Examination Today's Vitals   04/29/20 1549  BP: 132/74  Pulse: (!) 55  SpO2: 98%  Weight: 148 lb 3.2 oz (67.2 kg)  Height: 5\' 2"  (1.575 m)   Body mass index is 27.11 kg/m.  Gen: resting comfortably, no acute distress HEENT: no scleral icterus, pupils equal round and reactive, no palptable cervical adenopathy,  CV: RRR, no m/r/g, no jvd Resp: Clear to auscultation bilaterally GI: abdomen is soft,  non-tender, non-distended, normal bowel sounds, no hepatosplenomegaly MSK: extremities are warm, no edema.  Skin: warm, no rash Neuro:  no focal deficits Psych: appropriate affect   Diagnostic Studies Echocardiogram: 12/2018 IMPRESSIONS   1. The left ventricle has normal systolic function with an ejection fraction of 60-65%. The cavity size was normal. Left ventricular diastolic parameters were normal No evidence of left ventricular regional wall motion abnormalities. 2. The mitral valve is normal in structure. 3. The tricuspid valve is normal in structure. 4. The aortic valve is tricuspid. 5. The aortic root is normal in size and structure. 6. No evidence of left ventricular regional wall motion abnormalities.  NST: 01/09/2019  Blood pressure demonstrated a hypertensive response to exercise.  Horizontal ST segment depression ST segment depression of 0.5 mm was noted during recovery in the II, III, aVF, V5 and V6 leads. The patient was asymptomatic.  The study is normal. No perfusion defects consistent with myocardial ischemia or scar.  This is a low risk study.  Nuclear stress EF: 71%.    Assessment and Plan  1. CAD - no chest pains - continue current meds  2. DOE - progressing symptoms. Unclear etiology - I agree with Dr Charm Rings plan about a repeat echo - pending results may consider defintive  LHC/RHC given negative workup thus far  3. HTN - at goal, continue current meds  4. Leg pains - suffers from restless leg at night, but also reports some leg cramping with walking bilateral calves/thighs - high risk for PAD given her CAD, will obtain ABIs   F/u 6 weeks. May consider RHC/LHC pending echo and symptoms.       Antoine Poche, M.D.

## 2020-04-30 ENCOUNTER — Ambulatory Visit (INDEPENDENT_AMBULATORY_CARE_PROVIDER_SITE_OTHER): Payer: BC Managed Care – PPO | Admitting: Psychiatry

## 2020-04-30 DIAGNOSIS — F319 Bipolar disorder, unspecified: Secondary | ICD-10-CM | POA: Diagnosis not present

## 2020-04-30 NOTE — Progress Notes (Signed)
[facility] Initial Adult Exam  Name: [name] Date: 04/30/2020 MRN: [id number] DOB: 1965/11/09 PCP: Ila Mcgill, PA  Time spent: 60 minutes  1:00pm to 2:00pm  Guardian/Payee:  patient  Paperwork requested:  No   Reason for Visit /Presenting Problem / Symptoms:  Mood swings, Anxiety, depression, panic attacks, sadness, states the depression is her stronger symptom, ADHD and on meds, Reports having had 2 strokes (2010, 2005) and a heart attack in additon to triple bypass surgery in 2009.  Mental Status Exam:   Appearance:   Casual     Behavior:  Appropriate, Sharing and Motivated  Motor:  Normal  Speech/Language:   Normal Rate  Affect:  anxious, depressed  Mood:  anxious, depressed and sad  Thought process:  normal  Thought content:    WNL  Sensory/Perceptual disturbances:    WNL  Orientation:  oriented to person, place, time/date, situation, day of week, month of year and year  Attention:  Fair  Concentration:  Fair  Memory:  forgetfulness  Fund of knowledge:   Good  Insight:    Good  Judgment:   Good  Impulse Control:  Poor and "poor when it comes to shopping"   Reported Symptoms:  See symptoms above.  Risk Assessment: Danger to Self:  No Self-injurious Behavior: No Danger to Others: No Duty to Warn:no Physical Aggression / Violence:No  Access to Firearms a concern: No  Gang Involvement:No  Patient / guardian was educated about steps to take if suicide or homicide risk level increases between visits: Patient denies any SI or HI. While future psychiatric events cannot be accurately predicted, the patient does not currently require acute inpatient psychiatric care and does not currently meet [facility] involuntary commitment criteria.  Substance Abuse History: Current substance abuse: No     Past Psychiatric History:   Previous psychological history is significant for depression Outpatient Providers:names not known, but it was in [place],  [place] and [place], [place] History of Psych Hospitalization: No  Psychological Testing: n/a   Abuse History: Victim of No., n/a   Report needed: No. Victim of Neglect:No. Perpetrator of n/a  Witness / Exposure to Domestic Violence: No   Protective Services Involvement: No  Witness to [employer] Violence:  No   Family History: Reviewed with patient and she confirms info below. Family History  Problem Relation Age of Onset  . CAD Mother   . Intellectual disability Maternal Aunt   . Intellectual disability Maternal Uncle   . Alcohol abuse Paternal Grandfather     Living situation: the patient lives with their spouse. Married (4th marriage) or 6 yrs. No children. Husband is an Personnel officer.   Sexual Orientation:  Straight  Relationship Status: married  Name of spouse / other:n/a             If a parent, number of children / ages-  2 adult children by different husbands. Adult kids are 73 and 20 son and daughter.  Support Systems; spouse friends Mom  (lives in [place], [place])  Financial Stress:  Yes   Income/Employment/Disability: not employed.  Husband works full time.  Military Service: No   Educational History: Education: high school diploma/GED  Religion/Sprituality/World View:   Protestant  Any cultural differences that may affect / interfere with treatment:  not applicable   Recreation/Hobbies: fishing and spending time with her dog  Stressors:Health problems Other: health problems, some finiancial stress,   Strengths:  Supportive Relationships, Friends, Spirituality, Hopefulness and Able to Communicate Effectively  Barriers:  Myself- staying on meds,  Would need to really work on getting better. Need a lot of effort.  Legal History: Pending legal issue / charges: The patient has no significant history of legal issues. History of legal issue / charges: n/a  Medical History/Surgical History:Reviewed with  Patient an she confirms info below. Past Medical History:   Diagnosis Date  . ADD (attention deficit disorder with hyperactivity)   . Anxiety   . Bipolar 1 disorder (HCC)   . Carotid artery disease (HCC)    Right 50-69% stenosis by doppler 2010  . Chronic diastolic (congestive) heart failure (HCC)   . Coronary artery disease    a. s/p CABG in 2009 with LIMA-LAD, SVG-RI, and SVG-RCA b. low-risk NST in 10/2015 c. low-risk NST in 12/2018.  Marland Kitchen CVA (cerebral infarction) 12/2008   Reports a neurologist told her she had a blood clot in her brain  . Depression   . Emphysema of lung (HCC) 06/19/2018  . Fibromyalgia   . GERD (gastroesophageal reflux disease)   . Hyperlipidemia   . Hypertension   . OSA on CPAP 12/31/2018  . Tobacco abuse     Past Surgical History:  Procedure Laterality Date  . CARDIAC CATHETERIZATION    . CORONARY ARTERY BYPASS GRAFT  2009   3V CABG LIMA --> LAD, SVG --> Ramus Intermediate,  SVG --> RCA  . TUBAL LIGATION  1990    Medications: Current Outpatient Medications  Medication Sig Dispense Refill  . ALPRAZolam (XANAX) 0.5 MG tablet Take 1 tablet (0.5 mg total) by mouth 3 (three) times daily as needed. 90 tablet 2  . amLODipine (NORVASC) 10 MG tablet Take 1 tablet (10 mg total) by mouth daily. 90 tablet 3  . ARIPiprazole (ABILIFY) 10 MG tablet Take 1 tablet (10 mg total) by mouth daily. 90 tablet 1  . aspirin EC 81 MG tablet Take 81 mg by mouth daily.    . budesonide-formoterol (SYMBICORT) 160-4.5 MCG/ACT inhaler Inhale 2 puffs into the lungs 2 (two) times daily.    . busPIRone (BUSPAR) 15 MG tablet Take 1 tablet (15 mg total) by mouth 3 (three) times daily. 270 tablet 1  . cyclobenzaprine (FLEXERIL) 10 MG tablet Take 10 mg by mouth 2 (two) times daily as needed for muscle spasms.     . diphenoxylate-atropine (LOMOTIL) 2.5-0.025 MG tablet Take 2.5 mg by mouth daily as needed.     Marland Kitchen esomeprazole (NEXIUM) 40 MG capsule Take 40 mg by mouth every morning.    Marland Kitchen FLUoxetine (PROZAC) 40 MG capsule Take 40 mg by mouth 2 (two) times  daily.    . furosemide (LASIX) 40 MG tablet Take 1 tablet (40 mg total) by mouth daily. 30 tablet 0  . gabapentin (NEURONTIN) 300 MG capsule Take 1 capsule by mouth 4 (four) times daily.    Marland Kitchen ibuprofen (ADVIL) 800 MG tablet Take 800 mg by mouth every 8 (eight) hours as needed.    . nitroGLYCERIN (NITROSTAT) 0.4 MG SL tablet Place 1 tablet (0.4 mg total) under the tongue every 5 (five) minutes x 3 doses as needed for chest pain (if no relief after 3rd dose, proceed to the ED for an evaluation). 25 tablet 3  . ondansetron (ZOFRAN) 4 MG tablet Take 4 mg by mouth every 8 (eight) hours as needed.    . potassium chloride (KLOR-CON) 20 MEQ packet Take 20 mEq by mouth 2 (two) times daily. 60 tablet 6  . PROAIR HFA 108 (90 Base) MCG/ACT inhaler Inhale 2  puffs into the lungs every 6 (six) hours as needed for wheezing or shortness of breath.   3  . ranolazine (RANEXA) 1000 MG SR tablet Take 1 tablet (1,000 mg total) by mouth 2 (two) times daily. 180 tablet 3  . rOPINIRole (REQUIP) 1 MG tablet Take 1 mg by mouth at bedtime as needed (for restless leg).     . rosuvastatin (CRESTOR) 5 MG tablet Take 1 tablet (5 mg total) by mouth daily. 90 tablet 3  . Vitamin D, Ergocalciferol, (DRISDOL) 1.25 MG (50000 UT) CAPS capsule Take 50,000 Units by mouth once a week.    . zolpidem (AMBIEN) 10 MG tablet Take 1 tablet by mouth at bedtime as needed.     No current facility-administered medications for this visit.    Allergies  Allergen Reactions  . Imdur [Isosorbide Nitrate] Other (See Comments)    Headaches; Worsening Chest Pain    Diagnoses:    ICD-10-CM   1. Bipolar I disorder (HCC)  F31.9     Plan of Care:  Patient not signing treatment plan on computer screen due to Covid.  Treatment Goals: Goals remain on treatment plan as patient works with strategies to meet her treatment goals.  Progress is noted each session in the "Progress" section of treatment plan.  Long term goal: Develop healthy cognitive  patterns and beliefs about self and the world that lead to alleviation and help prevent relapse of depression symptoms.  Short term goal: Identify and replace depressive thinking that leads to depressive feelings and actions.   Strategy: Replace negative self-defeating self-talk with verbalization of realistic and positive cognitive messages.  Progress: Today is patient's first therapy session and we completed her initial evaluation and then worked together to formulate her treatment goal plan.  Patient is a 54 yr old married female with 2 adult children, ages 46 and 65 (son and daughter by different fathers).  She is currently married to her 4th husband and they've been married 6 yrs. They live in their own home.  Patient feels husband is supportive of her. She is not currently working. Patient is to go ahead in preliminary goal work and begin monitoring her anxious/depressive/negative thoughts and will be doing some written homework with those thoughts prior to next session.  Goals reviewed and patient motivated.  Next appt within 2 weeks.   Mathis Fare, LCSW

## 2020-05-02 ENCOUNTER — Encounter: Payer: Self-pay | Admitting: Internal Medicine

## 2020-05-06 ENCOUNTER — Other Ambulatory Visit: Payer: Self-pay | Admitting: Cardiology

## 2020-05-06 DIAGNOSIS — I739 Peripheral vascular disease, unspecified: Secondary | ICD-10-CM

## 2020-05-20 ENCOUNTER — Ambulatory Visit: Payer: BC Managed Care – PPO | Admitting: Adult Health

## 2020-05-20 ENCOUNTER — Other Ambulatory Visit: Payer: BC Managed Care – PPO

## 2020-05-27 ENCOUNTER — Ambulatory Visit: Payer: BC Managed Care – PPO | Admitting: Psychiatry

## 2020-06-04 ENCOUNTER — Ambulatory Visit: Payer: BC Managed Care – PPO | Admitting: Internal Medicine

## 2020-06-12 ENCOUNTER — Ambulatory Visit: Payer: BC Managed Care – PPO | Admitting: Cardiology

## 2020-06-17 ENCOUNTER — Ambulatory Visit: Payer: BC Managed Care – PPO | Admitting: Psychiatry

## 2020-12-01 ENCOUNTER — Other Ambulatory Visit: Payer: Self-pay

## 2020-12-01 ENCOUNTER — Emergency Department (HOSPITAL_COMMUNITY): Payer: BC Managed Care – PPO

## 2020-12-01 ENCOUNTER — Encounter (HOSPITAL_COMMUNITY): Payer: Self-pay

## 2020-12-01 ENCOUNTER — Emergency Department (HOSPITAL_COMMUNITY)
Admission: EM | Admit: 2020-12-01 | Discharge: 2020-12-01 | Disposition: A | Discharge status: Home / Self Care | Payer: BC Managed Care – PPO | Attending: Emergency Medicine | Admitting: Emergency Medicine

## 2020-12-01 DIAGNOSIS — M797 Fibromyalgia: Secondary | ICD-10-CM | POA: Diagnosis not present

## 2020-12-01 DIAGNOSIS — R0602 Shortness of breath: Secondary | ICD-10-CM | POA: Diagnosis present

## 2020-12-01 DIAGNOSIS — Z951 Presence of aortocoronary bypass graft: Secondary | ICD-10-CM | POA: Insufficient documentation

## 2020-12-01 DIAGNOSIS — Z8673 Personal history of transient ischemic attack (TIA), and cerebral infarction without residual deficits: Secondary | ICD-10-CM | POA: Insufficient documentation

## 2020-12-01 DIAGNOSIS — I5032 Chronic diastolic (congestive) heart failure: Secondary | ICD-10-CM | POA: Insufficient documentation

## 2020-12-01 DIAGNOSIS — I2510 Atherosclerotic heart disease of native coronary artery without angina pectoris: Secondary | ICD-10-CM | POA: Insufficient documentation

## 2020-12-01 DIAGNOSIS — Z79899 Other long term (current) drug therapy: Secondary | ICD-10-CM | POA: Insufficient documentation

## 2020-12-01 DIAGNOSIS — I110 Hypertensive heart disease with heart failure: Secondary | ICD-10-CM | POA: Insufficient documentation

## 2020-12-01 DIAGNOSIS — R0789 Other chest pain: Secondary | ICD-10-CM | POA: Diagnosis not present

## 2020-12-01 DIAGNOSIS — Z87891 Personal history of nicotine dependence: Secondary | ICD-10-CM | POA: Diagnosis not present

## 2020-12-01 DIAGNOSIS — U071 COVID-19: Secondary | ICD-10-CM | POA: Diagnosis not present

## 2020-12-01 LAB — CBC WITH DIFFERENTIAL/PLATELET
Abs Immature Granulocytes: 0.02 10*3/uL (ref 0.00–0.07)
Basophils Absolute: 0 10*3/uL (ref 0.0–0.1)
Basophils Relative: 1 %
Eosinophils Absolute: 0 10*3/uL (ref 0.0–0.5)
Eosinophils Relative: 1 %
HCT: 38.4 % (ref 36.0–46.0)
Hemoglobin: 12 g/dL (ref 12.0–15.0)
Immature Granulocytes: 0 %
Lymphocytes Relative: 37 %
Lymphs Abs: 2.3 10*3/uL (ref 0.7–4.0)
MCH: 28 pg (ref 26.0–34.0)
MCHC: 31.3 g/dL (ref 30.0–36.0)
MCV: 89.7 fL (ref 80.0–100.0)
Monocytes Absolute: 0.4 10*3/uL (ref 0.1–1.0)
Monocytes Relative: 6 %
Neutro Abs: 3.6 10*3/uL (ref 1.7–7.7)
Neutrophils Relative %: 55 %
Platelets: 333 10*3/uL (ref 150–400)
RBC: 4.28 MIL/uL (ref 3.87–5.11)
RDW: 14.5 % (ref 11.5–15.5)
WBC: 6.3 10*3/uL (ref 4.0–10.5)
nRBC: 0 % (ref 0.0–0.2)

## 2020-12-01 LAB — BASIC METABOLIC PANEL
Anion gap: 8 (ref 5–15)
BUN: 12 mg/dL (ref 6–20)
CO2: 24 mmol/L (ref 22–32)
Calcium: 9.2 mg/dL (ref 8.9–10.3)
Chloride: 103 mmol/L (ref 98–111)
Creatinine, Ser: 0.5 mg/dL (ref 0.44–1.00)
GFR, Estimated: >60 mL/min (ref >60)
Glucose, Bld: 86 mg/dL (ref 70–99)
Potassium: 3.7 mmol/L (ref 3.5–5.1)
Sodium: 135 mmol/L (ref 135–145)

## 2020-12-01 LAB — TROPONIN I (HIGH SENSITIVITY): Troponin I (High Sensitivity): <2 ng/L (ref <18)

## 2020-12-01 LAB — POC SARS CORONAVIRUS 2 AG -  ED: SARS Coronavirus 2 Ag: POSITIVE — AB

## 2020-12-01 MED ORDER — BENZONATATE 100 MG PO CAPS: 100.0000 mg | ORAL_CAPSULE | Freq: Three times a day (TID) | ORAL | 0 refills | Status: DC

## 2020-12-01 MED ORDER — SODIUM CHLORIDE 0.9 % IV SOLN
100.0000 mg | INTRAVENOUS | Status: AC
  Administered 2020-12-01 (×2): 100 mg via INTRAVENOUS
  Filled 2020-12-01 (×2): qty 20

## 2020-12-01 NOTE — ED Triage Notes (Signed)
Pt to er, pt states that she has had shortness of breath for the past two days.  Pt states that she has been around someone with covid, states that she has been vaccinated and tested twice and negative, but she just isn't getting any better.

## 2020-12-01 NOTE — ED Provider Notes (Signed)
[facility] EMERGENCY DEPARTMENT Provider Note   CSN: [id number] Arrival date & time: 12/01/20  1028     History Chief Complaint  Patient presents with  . Shortness of Breath    [name] is a 55 y.o. female with PMHx HTN, HLD, GERD, Fibromyalgia, CVA, CAD s/p CABG, CHf who presents to the ED today with COVID like symptoms for the past 6 days. Pt reports she watched her nephew 6 days ago; that night she began having headache, nausea, cough, diarrhea, chest tightness, SOB, and subjective fevers. Her nephew tested positive for COVID 19 the next day. Pt is fully vaccinated with booster. She has had 2 negative tests since then however was not getting any better and felt much more short of breath today prompting her to come to the ED for further evaluation. She has been taking Nyquil without much relief.   The history is provided by the patient and medical records.       Past Medical History:  Diagnosis Date  . ADD (attention deficit disorder with hyperactivity)   . Anxiety   . Bipolar 1 disorder (HCC)   . Carotid artery disease (HCC)    Right 50-69% stenosis by doppler 2010  . Chronic diastolic (congestive) heart failure (HCC)   . Coronary artery disease    a. s/p CABG in 2009 with LIMA-LAD, SVG-RI, and SVG-RCA b. low-risk NST in 10/2015 c. low-risk NST in 12/2018.  Marland Kitchen CVA (cerebral infarction) 12/2008   Reports a neurologist told her she had a blood clot in her brain  . Depression   . Emphysema of lung (HCC) 06/19/2018  . Fibromyalgia   . GERD (gastroesophageal reflux disease)   . Hyperlipidemia   . Hypertension   . OSA on CPAP 12/31/2018  . Tobacco abuse     Patient Active Problem List   Diagnosis Date Noted  . Chest pain 12/31/2018  . Anxiety 12/31/2018  . OSA on CPAP 12/31/2018  . Chronic diastolic CHF (congestive heart failure) (HCC) 12/31/2018  . PTSD (post-traumatic stress disorder) 07/21/2018  . Emphysema of lung (HCC) 06/19/2018  . GAD (generalized anxiety  disorder) 06/19/2018  . Panic disorder 06/19/2018  . Colon cancer screening 04/24/2018  . Laryngopharyngeal reflux (LPR) 01/26/2018  . S/P CABG x 3 05/13/2014  . CAD (coronary artery disease) 03/01/2012  . Chest pain 09/18/2011  . Tobacco abuse 09/18/2011  . Stroke (HCC) 10/07/2009  . Mood disorder in conditions classified elsewhere 09/23/2009  . Smokes 09/23/2009  . RESTLESS LEG SYNDROME 09/23/2009  . Essential hypertension 09/23/2009  . GERD 09/23/2009  . FIBROMYALGIA 09/23/2009    Past Surgical History:  Procedure Laterality Date  . CARDIAC CATHETERIZATION    . CORONARY ARTERY BYPASS GRAFT  2009   3V CABG LIMA --> LAD, SVG --> Ramus Intermediate,  SVG --> RCA  . TUBAL LIGATION  1990     OB History    Gravida  2   Para  2   Term  2   Preterm      AB      Living  2     SAB      IAB      Ectopic      Multiple      Live Births              Family History  Problem Relation Age of Onset  . CAD Mother   . Intellectual disability Maternal Aunt   . Intellectual disability Maternal Uncle   .  Alcohol abuse Paternal Grandfather     Social History   Tobacco Use  . Smoking status: Former Smoker    Packs/day: 0.50    Years: 35.00    Pack years: 17.50    Types: Cigarettes    Start date: 12/14/1981    Quit date: 12/17/2018    Years since quitting: 1.9  . Smokeless tobacco: Never Used  Vaping Use  . Vaping Use: Former  . Substances: Flavoring  Substance Use Topics  . Alcohol use: Not Currently  . Drug use: No    Home Medications Prior to Admission medications   Medication Sig Start Date End Date Taking? Authorizing Provider  benzonatate (TESSALON) 100 MG capsule Take 1 capsule (100 mg total) by mouth every 8 (eight) hours. 12/01/20  Yes Venter, Margaux, PA-C  ALPRAZolam Prudy Feeler) 0.5 MG tablet Take 1 tablet (0.5 mg total) by mouth 3 (three) times daily as needed. 03/17/20   Mozingo, Thereasa Solo, NP  amLODipine (NORVASC) 10 MG tablet Take 1 tablet  (10 mg total) by mouth daily. 09/05/18   Laqueta Linden, MD  ARIPiprazole (ABILIFY) 10 MG tablet Take 1 tablet (10 mg total) by mouth daily. 03/17/20   Mozingo, Thereasa Solo, NP  aspirin EC 81 MG tablet Take 81 mg by mouth daily.    [provider]  budesonide-formoterol (SYMBICORT) 160-4.5 MCG/ACT inhaler Inhale 2 puffs into the lungs 2 (two) times daily.    [provider]  busPIRone (BUSPAR) 15 MG tablet Take 1 tablet (15 mg total) by mouth 3 (three) times daily. 03/17/20   Mozingo, Thereasa Solo, NP  cyclobenzaprine (FLEXERIL) 10 MG tablet Take 10 mg by mouth 2 (two) times daily as needed for muscle spasms.     [provider]  diphenoxylate-atropine (LOMOTIL) 2.5-0.025 MG tablet Take 2.5 mg by mouth daily as needed.     [provider]  esomeprazole (NEXIUM) 40 MG capsule Take 40 mg by mouth every morning.    [provider]  FLUoxetine (PROZAC) 40 MG capsule Take 40 mg by mouth 2 (two) times daily.    [provider]  furosemide (LASIX) 40 MG tablet Take 1 tablet (40 mg total) by mouth daily. 01/02/19   Erick Blinks, MD  gabapentin (NEURONTIN) 300 MG capsule Take 1 capsule by mouth 4 (four) times daily. 11/20/18   [provider]  ibuprofen (ADVIL) 800 MG tablet Take 800 mg by mouth every 8 (eight) hours as needed. 01/12/20   [provider]  nitroGLYCERIN (NITROSTAT) 0.4 MG SL tablet Place 1 tablet (0.4 mg total) under the tongue every 5 (five) minutes x 3 doses as needed for chest pain (if no relief after 3rd dose, proceed to the ED for an evaluation). 11/21/17   Laqueta Linden, MD  ondansetron (ZOFRAN) 4 MG tablet Take 4 mg by mouth every 8 (eight) hours as needed. 11/01/19   [provider]  potassium chloride (KLOR-CON) 20 MEQ packet Take 20 mEq by mouth 2 (two) times daily. 12/06/18   Laqueta Linden, MD  PROAIR HFA 108 [phone] Base) MCG/ACT inhaler Inhale 2 puffs into the lungs every 6 (six) hours  as needed for wheezing or shortness of breath.  08/22/15   [provider]  ranolazine (RANEXA) 1000 MG SR tablet Take 1 tablet (1,000 mg total) by mouth 2 (two) times daily. 10/08/19   Laqueta Linden, MD  rOPINIRole (REQUIP) 1 MG tablet Take 1 mg by mouth at bedtime as needed (for restless leg).  [provider]  rosuvastatin (CRESTOR) 5 MG tablet Take 1 tablet (5 mg total) by mouth daily. 03/23/16   Laqueta Linden, MD  Vitamin D, Ergocalciferol, (DRISDOL) 1.25 MG (50000 UT) CAPS capsule Take 50,000 Units by mouth once a week. 10/23/18   [provider]  zolpidem (AMBIEN) 10 MG tablet Take 1 tablet by mouth at bedtime as needed. 11/23/18   [provider]    Allergies    Imdur [isosorbide nitrate]  Review of Systems   Review of Systems  Constitutional: Positive for fatigue and fever (subjective). Negative for chills.  Respiratory: Positive for cough, chest tightness and shortness of breath.   Gastrointestinal: Positive for diarrhea and nausea. Negative for abdominal pain and vomiting.  Musculoskeletal: Positive for myalgias.  Neurological: Positive for headaches.  All other systems reviewed and are negative.   Physical Exam Updated Vital Signs BP (!) 155/77   Pulse (!) 55   Temp 98.2 F (36.8 C) (Oral)   Resp (!) 21   Ht 5\' 2"  (1.575 m)   Wt 65.8 kg   LMP 10/22/2015   SpO2 99%   BMI 26.52 kg/m   Physical Exam Vitals and nursing note reviewed.  Constitutional:      Appearance: She is ill-appearing. She is not diaphoretic.  HENT:     Head: Normocephalic and atraumatic.  Eyes:     Conjunctiva/sclera: Conjunctivae normal.  Cardiovascular:     Rate and Rhythm: Normal rate and regular rhythm.     Pulses: Normal pulses.  Pulmonary:     Effort: Pulmonary effort is normal.     Breath sounds: Normal breath sounds. No decreased breath sounds, wheezing, rhonchi or rales.     Comments: Able to speak in full sentences without  difficulty. Satting 96% on RA. LCTAB.  Chest:     Comments: Sternotomy scar Abdominal:     Palpations: Abdomen is soft.     Tenderness: There is no abdominal tenderness. There is no guarding or rebound.  Musculoskeletal:     Cervical back: Neck supple.     Right lower leg: No edema.     Left lower leg: No edema.  Skin:    General: Skin is warm and dry.  Neurological:     Mental Status: She is alert.     ED Results / Procedures / Treatments   Labs (all labs ordered are listed, but only abnormal results are displayed) Labs Reviewed  POC SARS CORONAVIRUS 2 AG -  ED - Abnormal; Notable for the following components:      Result Value   SARS Coronavirus 2 Ag POSITIVE (*)    All other components within normal limits  BASIC METABOLIC PANEL  CBC WITH DIFFERENTIAL/PLATELET  TROPONIN I (HIGH SENSITIVITY)    EKG EKG Interpretation  Date/Time:  Monday December 01 2020 10:55:12 EST Ventricular Rate:  66 PR Interval:  126 QRS Duration: 92 QT Interval:  412 QTC Calculation: 431 R Axis:   89 Text Interpretation: Normal sinus rhythm Nonspecific ST abnormality Abnormal ECG Confirmed by 03-09-1997 [phone]) on 12/01/2020 3:32:15 PM   Radiology DG Chest Port 1 View  Result Date: 12/01/2020 CLINICAL DATA:  Cough and positive test for COVID-19 today. EXAM: PORTABLE CHEST 1 VIEW COMPARISON:  10/07/2019 FINDINGS: Stable heart size after prior CABG. Lungs demonstrate mild bronchial and interstitial prominence throughout both lungs which may reflect some component of bronchitis/interstitial pneumonia. No focal airspace opacity, pleural fluid or pneumothorax identified. IMPRESSION: Mild bronchial and interstitial prominence throughout both  lungs which may reflect some component of bronchitis/interstitial pneumonia. Electronically Signed   By: Irish Lack M.D.   On: 12/01/2020 12:27    Procedures Procedures   Medications Ordered in ED Medications  remdesivir 100 mg in sodium chloride  0.9 % 100 mL IVPB (100 mg Intravenous New Bag/Given 12/01/20 1715)    ED Course  I have reviewed the triage vital signs and the nursing notes.  Pertinent labs & imaging results that were available during my care of the patient were reviewed by me and considered in my medical decision making (see chart for details).    MDM Rules/Calculators/A&P                          55 year old female who presents to the ED for COVID like symptoms for the past 6 days. She is vaccinated x 3. She did have positive exposure with her nephew who tested positive the day after she saw him.  On arrival vitals are stable.  Patient is afebrile, nontachycardic and nontachypneic.  Blood pressure initially elevated 133/73 however with recheck more normotensive at 155/77.  Does appear ill today and fatigued.  She does not appear to be working hard to breathe, she is able speak in full sentences without difficulty despite complaining of shortness of breath.  She is satting anywhere from 96 to 100% on room air.  Her lungs are clear to auscultation bilaterally.  She did have a point-of-care Covid test while in the waiting room which has returned positive.  Patient is also complaining of some chest tightness.  She does have a history of ACS.  She unfortunately has very high risk for complications from her Covid however does not appear to require admission at this time.  We will plan for lab work to assess electrolyte abnormalities as patient has been having some diarrhea as well as a troponin.  If no acute findings have discussed remdesivir here with outpatient follow-up in the remdesivir infusion clinic.   CBC without leukocytosis. Hgb stable at 12.0 BMP without electrolyte abnormalities CXR IMPRESSION:  Mild bronchial and interstitial prominence throughout both lungs  which may reflect some component of bronchitis/interstitial  pneumonia.   Labwork reassuring at this time. Pt received 1st dose of remdesivir here. She has been  scheduled for tomorrow and Wednesday at the infusion clinic. Will discharge home at this time. Pt instructed to follow up with PCP regarding ED visit and to return for any worsening symptoms. She is in agreement with plan and stable for discharge home.   This note was prepared using Dragon voice recognition software and may include unintentional dictation errors due to the inherent limitations of voice recognition software.  Final Clinical Impression(s) / ED Diagnoses Final diagnoses:  COVID-19    Rx / DC Orders ED Discharge Orders         Ordered    benzonatate (TESSALON) 100 MG capsule  Every 8 hours        12/01/20 1749           Discharge Instructions     Patient scheduled for outpatient Remdesivir infusions at 3:30pm on Tuesday 2/8 and Wednesday 2/9 at [facility]. Please inform the patient to park at [address] [place], [place], as staff will be escorting the patient through the east entrance of the hospital. Appointments take approximately 45 minutes.    There is a wave flag banner located near the entrance on [address]. Turn  into this entrance and immediately turn left or right and park in 1 of the 10 designated Covid Infusion Parking spots. There is a phone number on the sign, please call and let the staff know what spot you are in and we will come out and get you. For questions call [phone].  Thanks.  Follow up with your PCP regarding your ED visit today. Pick up cough medication and take as prescribed. Continue taking the rest of your home medications and use your inhalers as needed.   I would recommend buying a pulse oximeter (can be bought off [employer] or other local stores including [facility] or [facility]) to check your oxygen levels. If your oxygen level is persistently < 90% specifically at rest then you need to come back to the ED IMMEDIATELY for reevaluation.   Return to the ED for any worsening symptoms including worsening shortness of breath, severe chest  pain, passing out, fingers/lips turning blue, low oxygen levels, inability to awaken easily, new onset confusion, or any other new/concerning symptoms       Tanda RockersVenter, Margaux, PA-C 12/01/20 Reece [id number]    Pickering, Nathan, MD 12/02/20 1013

## 2020-12-01 NOTE — Progress Notes (Signed)
Patient scheduled for outpatient Remdesivir infusions at 3:30pm on Tuesday 2/8 and Wednesday 2/9 at [facility]. Please inform the patient to park at [address] [place], [place], as staff will be escorting the patient through the east entrance of the hospital. Appointments take approximately 45 minutes.    There is a wave flag banner located near the entrance on [address]. Turn into this entrance and immediately turn left or right and park in 1 of the 10 designated Covid Infusion Parking spots. There is a phone number on the sign, please call and let the staff know what spot you are in and we will come out and get you. For questions call [phone].  Thanks.

## 2020-12-01 NOTE — ED Notes (Signed)
Pt. Walked back and forth in room for 3 minutes oxygen stayed at 100%

## 2020-12-01 NOTE — Discharge Instructions (Addendum)
Patient scheduled for outpatient Remdesivir infusions at 3:30pm on Tuesday 2/8 and Wednesday 2/9 at [facility]. Please inform the patient to park at [address] [place], [place], as staff will be escorting the patient through the east entrance of the hospital. Appointments take approximately 45 minutes.    There is a wave flag banner located near the entrance on [address]. Turn into this entrance and immediately turn left or right and park in 1 of the 10 designated Covid Infusion Parking spots. There is a phone number on the sign, please call and let the staff know what spot you are in and we will come out and get you. For questions call [phone].  Thanks.  Follow up with your PCP regarding your ED visit today. Pick up cough medication and take as prescribed. Continue taking the rest of your home medications and use your inhalers as needed.   I would recommend buying a pulse oximeter (can be bought off [employer] or other local stores including [facility] or [facility]) to check your oxygen levels. If your oxygen level is persistently < 90% specifically at rest then you need to come back to the ED IMMEDIATELY for reevaluation.   Return to the ED for any worsening symptoms including worsening shortness of breath, severe chest pain, passing out, fingers/lips turning blue, low oxygen levels, inability to awaken easily, new onset confusion, or any other new/concerning symptoms

## 2020-12-02 ENCOUNTER — Ambulatory Visit (HOSPITAL_COMMUNITY)
Admission: RE | Admit: 2020-12-02 | Discharge: 2020-12-02 | Disposition: A | Discharge status: Home / Self Care | Payer: BC Managed Care – PPO | Source: Ambulatory Visit | Attending: Pulmonary Disease | Admitting: Pulmonary Disease

## 2020-12-02 DIAGNOSIS — U071 COVID-19: Secondary | ICD-10-CM | POA: Diagnosis present

## 2020-12-02 DIAGNOSIS — J1282 Pneumonia due to coronavirus disease 2019: Secondary | ICD-10-CM | POA: Insufficient documentation

## 2020-12-02 MED ORDER — FAMOTIDINE IN NACL 20-0.9 MG/50ML-% IV SOLN: 20.0000 mg | Freq: Once | INTRAVENOUS | Status: DC | PRN

## 2020-12-02 MED ORDER — EPINEPHRINE 0.3 MG/0.3ML IJ SOAJ: 0.3000 mg | Freq: Once | INTRAMUSCULAR | Status: DC | PRN

## 2020-12-02 MED ORDER — SODIUM CHLORIDE 0.9 % IV SOLN
100.0000 mg | Freq: Once | INTRAVENOUS | Status: AC
  Administered 2020-12-02: 100 mg via INTRAVENOUS

## 2020-12-02 MED ORDER — METHYLPREDNISOLONE SODIUM SUCC 125 MG IJ SOLR: 125.0000 mg | Freq: Once | INTRAMUSCULAR | Status: DC | PRN

## 2020-12-02 MED ORDER — ALBUTEROL SULFATE HFA 108 (90 BASE) MCG/ACT IN AERS: 2.0000 | INHALATION_SPRAY | Freq: Once | RESPIRATORY_TRACT | Status: DC | PRN

## 2020-12-02 MED ORDER — SODIUM CHLORIDE 0.9 % IV SOLN: INTRAVENOUS | Status: DC | PRN

## 2020-12-02 MED ORDER — DIPHENHYDRAMINE HCL 50 MG/ML IJ SOLN: 50.0000 mg | Freq: Once | INTRAMUSCULAR | Status: DC | PRN

## 2020-12-02 NOTE — Progress Notes (Addendum)
  Diagnosis: COVID-19  Physician: Dr. Delford Field   Procedure: [facility] Infusion Clinic Med: remdesivir infusion - Provided patient with remdesivir fact sheet for patients, parents and caregivers prior to infusion.  Complications: No immediate complications noted.  Discharge: Discharged home   Nat Math 12/02/2020

## 2020-12-02 NOTE — Discharge Instructions (Signed)
10 Things You Can Do to Manage Your COVID-19 Symptoms at Home °If you have possible or confirmed COVID-19: °1. Stay home except to get medical care. °2. Monitor your symptoms carefully. If your symptoms get worse, call your healthcare provider immediately. °3. Get rest and stay hydrated. °4. If you have a medical appointment, call the healthcare provider ahead of time and tell them that you have or may have COVID-19. °5. For medical emergencies, call 911 and notify the dispatch personnel that you have or may have COVID-19. °6. Cover your cough and sneezes with a tissue or use the inside of your elbow. °7. Wash your hands often with soap and water for at least 20 seconds or clean your hands with an alcohol-based hand sanitizer that contains at least 60% alcohol. °8. As much as possible, stay in a specific room and away from other people in your home. Also, you should use a separate bathroom, if available. If you need to be around other people in or outside of the home, wear a mask. °9. Avoid sharing personal items with other people in your household, like dishes, towels, and bedding. °10. Clean all surfaces that are touched often, like counters, tabletops, and doorknobs. Use household cleaning sprays or wipes according to the label instructions. °cdc.gov/coronavirus °05/09/2020 °This information is not intended to replace advice given to you by your health care provider. Make sure you discuss any questions you have with your health care provider. °Document Revised: 08/25/2020 Document Reviewed: 08/25/2020 °Elsevier Patient Education © 2021 Elsevier Inc. °If you have any questions or concerns after the infusion please call the Advanced Practice Provider on call at [phone]. This number is ONLY intended for your use regarding questions or concerns about the infusion post-treatment side-effects.  Please do not provide this number to others for use. For return to work notes please contact your primary care provider.   ° °If someone you know is interested in receiving treatment please have them contact their MD for a referral or visit [email or web address] ° ° ° °

## 2020-12-02 NOTE — Progress Notes (Signed)
Patient reviewed Fact Sheet for Patients, Parents, and Caregivers for Emergency Use Authorization (EUA) of remdesivir for the Treatment of Coronavirus. Patient also reviewed and is agreeable to the estimated cost of treatment. Patient is agreeable to proceed.    

## 2020-12-03 ENCOUNTER — Ambulatory Visit (HOSPITAL_COMMUNITY)
Admit: 2020-12-03 | Discharge: 2020-12-03 | Disposition: A | Discharge status: Home / Self Care | Payer: BC Managed Care – PPO | Source: Ambulatory Visit | Attending: Pulmonary Disease | Admitting: Pulmonary Disease

## 2020-12-03 DIAGNOSIS — J1282 Pneumonia due to coronavirus disease 2019: Secondary | ICD-10-CM | POA: Diagnosis not present

## 2020-12-03 DIAGNOSIS — U071 COVID-19: Secondary | ICD-10-CM | POA: Diagnosis present

## 2020-12-03 MED ORDER — ALBUTEROL SULFATE HFA 108 (90 BASE) MCG/ACT IN AERS: 2.0000 | INHALATION_SPRAY | Freq: Once | RESPIRATORY_TRACT | Status: DC | PRN

## 2020-12-03 MED ORDER — DIPHENHYDRAMINE HCL 50 MG/ML IJ SOLN: 50.0000 mg | Freq: Once | INTRAMUSCULAR | Status: DC | PRN

## 2020-12-03 MED ORDER — METHYLPREDNISOLONE SODIUM SUCC 125 MG IJ SOLR: 125.0000 mg | Freq: Once | INTRAMUSCULAR | Status: DC | PRN

## 2020-12-03 MED ORDER — SODIUM CHLORIDE 0.9 % IV SOLN: INTRAVENOUS | Status: DC | PRN

## 2020-12-03 MED ORDER — SODIUM CHLORIDE 0.9 % IV SOLN
100.0000 mg | Freq: Once | INTRAVENOUS | Status: AC
  Administered 2020-12-03: 100 mg via INTRAVENOUS

## 2020-12-03 MED ORDER — EPINEPHRINE 0.3 MG/0.3ML IJ SOAJ: 0.3000 mg | Freq: Once | INTRAMUSCULAR | Status: DC | PRN

## 2020-12-03 MED ORDER — FAMOTIDINE IN NACL 20-0.9 MG/50ML-% IV SOLN: 20.0000 mg | Freq: Once | INTRAVENOUS | Status: DC | PRN

## 2020-12-03 NOTE — Progress Notes (Signed)
  Diagnosis: COVID-19  Physician: Dr. Shan Levans  Procedure: Cov[facility] Infusion Clinic Med: remdesivir infusion - Provided patient with remdesivir fact sheet for patients, parents and caregivers prior to infusion.  Complications: No immediate complications noted.  Discharge: Discharged home   [name] 12/03/2020

## 2020-12-03 NOTE — Discharge Instructions (Signed)
10 Things You Can Do to Manage Your COVID-19 Symptoms at Home If you have possible or confirmed COVID-19: 1. Stay home except to get medical care. 2. Monitor your symptoms carefully. If your symptoms get worse, call your healthcare provider immediately. 3. Get rest and stay hydrated. 4. If you have a medical appointment, call the healthcare provider ahead of time and tell them that you have or may have COVID-19. 5. For medical emergencies, call 911 and notify the dispatch personnel that you have or may have COVID-19. 6. Cover your cough and sneezes with a tissue or use the inside of your elbow. 7. Wash your hands often with soap and water for at least 20 seconds or clean your hands with an alcohol-based hand sanitizer that contains at least 60% alcohol. 8. As much as possible, stay in a specific room and away from other people in your home. Also, you should use a separate bathroom, if available. If you need to be around other people in or outside of the home, wear a mask. 9. Avoid sharing personal items with other people in your household, like dishes, towels, and bedding. 10. Clean all surfaces that are touched often, like counters, tabletops, and doorknobs. Use household cleaning sprays or wipes according to the label instructions.  If you have any questions or concerns after the infusion please call the Advanced Practice Provider on call at [phone]. This number is ONLY intended for your use regarding questions or concerns about the infusion post-treatment side-effects.  Please do not provide this number to others for use. For return to work notes please contact your primary care provider.   If someone you know is interested in receiving treatment please have them contact their MD for a referral or visit [email or web address]   [email or web address] 05/09/2020 This information is not intended to replace advice given to you by your health care provider. Make sure you discuss any  questions you have with your health care provider. Document Revised: 08/25/2020 Document Reviewed: 08/25/2020 Elsevier Patient Education  2021 [employer].

## 2020-12-03 NOTE — Progress Notes (Signed)
Patient reviewed Fact Sheet for Patients, Parents, and Caregivers for Emergency Use Authorization (EUA) of sotrovimab for the Treatment of Coronavirus. Patient also reviewed and is agreeable to the estimated cost of treatment. Patient is agreeable to proceed.   

## 2020-12-08 ENCOUNTER — Ambulatory Visit: Payer: Self-pay | Admitting: Family Medicine

## 2020-12-09 ENCOUNTER — Ambulatory Visit: Payer: Self-pay | Admitting: Nurse Practitioner

## 2020-12-09 ENCOUNTER — Other Ambulatory Visit: Payer: BC Managed Care – PPO

## 2020-12-18 ENCOUNTER — Ambulatory Visit: Payer: BC Managed Care – PPO | Admitting: Nurse Practitioner

## 2020-12-18 ENCOUNTER — Other Ambulatory Visit: Payer: Self-pay

## 2020-12-18 ENCOUNTER — Encounter: Payer: Self-pay | Admitting: Nurse Practitioner

## 2020-12-18 VITALS — BP 162/61 | HR 60 | Temp 98.0°F | Resp 18 | Ht 62.0 in | Wt 140.0 lb

## 2020-12-18 DIAGNOSIS — Z7689 Persons encountering health services in other specified circumstances: Secondary | ICD-10-CM

## 2020-12-18 DIAGNOSIS — J439 Emphysema, unspecified: Secondary | ICD-10-CM

## 2020-12-18 DIAGNOSIS — F5102 Adjustment insomnia: Secondary | ICD-10-CM

## 2020-12-18 DIAGNOSIS — K219 Gastro-esophageal reflux disease without esophagitis: Secondary | ICD-10-CM | POA: Diagnosis not present

## 2020-12-18 DIAGNOSIS — M797 Fibromyalgia: Secondary | ICD-10-CM

## 2020-12-18 DIAGNOSIS — I639 Cerebral infarction, unspecified: Secondary | ICD-10-CM | POA: Diagnosis not present

## 2020-12-18 DIAGNOSIS — F411 Generalized anxiety disorder: Secondary | ICD-10-CM

## 2020-12-18 DIAGNOSIS — Z9989 Dependence on other enabling machines and devices: Secondary | ICD-10-CM

## 2020-12-18 DIAGNOSIS — I10 Essential (primary) hypertension: Secondary | ICD-10-CM

## 2020-12-18 DIAGNOSIS — I25708 Atherosclerosis of coronary artery bypass graft(s), unspecified, with other forms of angina pectoris: Secondary | ICD-10-CM

## 2020-12-18 DIAGNOSIS — F319 Bipolar disorder, unspecified: Secondary | ICD-10-CM | POA: Insufficient documentation

## 2020-12-18 DIAGNOSIS — G4733 Obstructive sleep apnea (adult) (pediatric): Secondary | ICD-10-CM

## 2020-12-18 DIAGNOSIS — G4700 Insomnia, unspecified: Secondary | ICD-10-CM | POA: Insufficient documentation

## 2020-12-18 DIAGNOSIS — Z139 Encounter for screening, unspecified: Secondary | ICD-10-CM

## 2020-12-18 NOTE — Assessment & Plan Note (Addendum)
-  takes prozac and xanax -referred to psych

## 2020-12-18 NOTE — Assessment & Plan Note (Signed)
-  had 3 vessel CABG previously -on ASA -imdur causes headaches

## 2020-12-18 NOTE — Assessment & Plan Note (Signed)
-  she states her memory is impaired after CVA

## 2020-12-18 NOTE — Progress Notes (Signed)
New Patient Office Visit  Subjective:  Patient ID: [name], female    DOB: [birth year]  Age: 55 y.o. MRN: [id number]  CC:  Chief Complaint  Patient presents with  . New Patient (Initial Visit)    HPI [name] presents for new patient visit. Transferring care from [place]. Last physical was last year some time. Last labs were drawn several weeks ago, but she is unsure of the dates.  No acute concerns.  She was taking abilify previously, and she stopped this about a year ago.  She states that she feels more depressed and more anxious now than when she was on abilify.  Past Medical History:  Diagnosis Date  . ADD (attention deficit disorder with hyperactivity)   . Anxiety   . Bipolar 1 disorder ([place])   . Carotid artery disease ([place])    Right 50-69% stenosis by doppler 2010  . Chronic diastolic (congestive) heart failure ([place])   . COPD (chronic obstructive pulmonary disease) ([place])    Phreesia 12/15/2020  . Coronary artery disease    a. s/p CABG in 2009 with LIMA-LAD, SVG-RI, and SVG-RCA b. low-risk NST in 10/2015 c. low-risk NST in 12/2018.  Marland Kitchen CVA (cerebral infarction) 12/2008   Reports a neurologist told her she had a blood clot in her brain  . Depression   . Depression    Phreesia 12/15/2020  . Emphysema of lung ([place]) 06/19/2018  . Fibromyalgia   . GERD (gastroesophageal reflux disease)   . Hyperlipidemia   . Hypertension   . Myocardial infarction ([place]) 09/18/2011   Phreesia 12/15/2020  . OSA on CPAP 12/31/2018  . Sleep apnea    Phreesia 12/15/2020  . Stroke [facility])    Phreesia 12/15/2020  . Tobacco abuse     Past Surgical History:  Procedure Laterality Date  . CARDIAC CATHETERIZATION    . CORONARY ARTERY BYPASS GRAFT  2009   3V CABG LIMA --> LAD, SVG --> [place] Intermediate,  SVG --> RCA  . TUBAL LIGATION  1990    Family History  Problem Relation Age of Onset  . CAD Mother   . Intellectual disability Maternal Aunt   . Intellectual disability  Maternal Uncle   . Alcohol abuse Paternal Grandfather     Social History   Socioeconomic History  . Marital status: Married    Spouse name: Not on file  . Number of children: Not on file  . Years of education: Not on file  . Highest education level: Not on file  Occupational History  . Occupation: [employer]    Comment: Dentist- in Psychologist, educational  Tobacco Use  . Smoking status: Former Smoker    Packs/day: 0.50    Years: 35.00    Pack years: 17.50    Types: Cigarettes    Start date: 12/14/1981    Quit date: 12/17/2018    Years since quitting: 2.0  . Smokeless tobacco: Never Used  Vaping Use  . Vaping Use: Every day  . Substances: Nicotine, Flavoring  Substance and Sexual Activity  . Alcohol use: Not Currently    Comment: rarely- had a margarita on birthday  . Drug use: No  . Sexual activity: Yes    Birth control/protection: Post-menopausal  Other Topics Concern  . Not on file  Social History Narrative   Married with 2 children who are grown; has pets   Social Determinants of Radio broadcast assistant Strain: Not on file  Food Insecurity: Not on file  Transportation Needs: Not on file  Physical Activity: Not on file  Stress: Not on file  Social Connections: Not on file  Intimate Partner Violence: Not on file    ROS Review of Systems  Constitutional: Negative.   Respiratory: Negative.   Cardiovascular: Negative.   Psychiatric/Behavioral: Positive for sleep disturbance. Negative for self-injury and suicidal ideas. The patient is nervous/anxious.     Objective:   Today's Vitals: BP (!) 162/61   Pulse 60   Temp 98 F (36.7 C)   Resp 18   Ht 5\' 2"  (1.575 m)   Wt 140 lb (63.5 kg)   LMP 10/22/2015   SpO2 96%   BMI 25.61 kg/m   Physical Exam Constitutional:      Appearance: Normal appearance.  Cardiovascular:     Rate and Rhythm: Normal rate and regular rhythm.     Pulses: Normal pulses.     Heart sounds: Normal heart sounds.   Pulmonary:     Effort: Pulmonary effort is normal.     Breath sounds: Normal breath sounds.  Neurological:     Mental Status: She is alert.  Psychiatric:        Mood and Affect: Mood normal.        Behavior: Behavior normal.        Thought Content: Thought content normal.        Judgment: Judgment normal.     Assessment & Plan:   Problem List Items Addressed This Visit      Cardiovascular and Mediastinum   Essential hypertension    -BP elevated today -takes amlodipine and lasix      Stroke [facility])    -she states her memory is impaired after CVA      CAD (coronary artery disease)    -had 3 vessel CABG previously -on ASA -imdur causes headaches        Respiratory   Emphysema of lung (HCC)    -she stated that she liked the albuterol better than the symbicort -we discussed that albuterol is a rescue inhaler, whereas symbicort is for maintenance therapy -she states she will use symbicort BID as prescribed      OSA on CPAP    -she no longer wears CPAP, likely contributory to her elevated BP        Digestive   GERD    -takes esomeprazole -no issues today        Other   Fibromyalgia    -uses flexeril and gabapentin PRN      GAD (generalized anxiety disorder)    -takes prozac and xanax -referred to psych      Bipolar 1 disorder (HCC)    -was taking abilify previously, but stopped that about a year ago -she saw a counselor up to last summer; unable to review notes d/t confidentiality issues  -referral to psych for mood disorders       Insomnia    -related to shift work; her shifts swap each week from night/day -takes [facility] when she works nights -discussed dangers of mixing this with xanax -defer treatment to psych       Other Visit Diagnoses    Encounter to establish care    -  Primary   Relevant Orders   Ambulatory referral to Psychiatry   CBC with Differential/Platelet   CMP14+EGFR   Lipid Panel With LDL/HDL Ratio   HCV Ab w/Rflx to  Verification   Screening due       Relevant Orders   HCV Ab  w/Rflx to Verification      Outpatient Encounter Medications as of 12/18/2020  Medication Sig  . ALPRAZolam (XANAX) 0.5 MG tablet Take 1 tablet (0.5 mg total) by mouth 3 (three) times daily as needed.  Marland Kitchen amLODipine (NORVASC) 10 MG tablet Take 1 tablet (10 mg total) by mouth daily.  Marland Kitchen aspirin EC 81 MG tablet Take 81 mg by mouth daily.  . budesonide-formoterol (SYMBICORT) 160-4.5 MCG/ACT inhaler Inhale 2 puffs into the lungs 2 (two) times daily.  . cyclobenzaprine (FLEXERIL) 10 MG tablet Take 10 mg by mouth 2 (two) times daily as needed for muscle spasms.   . diphenoxylate-atropine (LOMOTIL) 2.5-0.025 MG tablet Take 2.5 mg by mouth daily as needed.   Marland Kitchen esomeprazole (NEXIUM) 40 MG capsule Take 40 mg by mouth every morning.  Marland Kitchen FLUoxetine (PROZAC) 40 MG capsule Take 40 mg by mouth 2 (two) times daily.  . furosemide (LASIX) 40 MG tablet Take 1 tablet (40 mg total) by mouth daily.  Marland Kitchen gabapentin (NEURONTIN) 300 MG capsule Take 1 capsule by mouth 4 (four) times daily.  Marland Kitchen ibuprofen (ADVIL) 800 MG tablet Take 800 mg by mouth every 8 (eight) hours as needed.  . nitroGLYCERIN (NITROSTAT) 0.4 MG SL tablet Place 1 tablet (0.4 mg total) under the tongue every 5 (five) minutes x 3 doses as needed for chest pain (if no relief after 3rd dose, proceed to the ED for an evaluation).  . ondansetron (ZOFRAN) 4 MG tablet Take 4 mg by mouth every 8 (eight) hours as needed.  . potassium chloride (KLOR-CON) 20 MEQ packet Take 20 mEq by mouth 2 (two) times daily.  Marland Kitchen PROAIR HFA 108 (90 Base) MCG/ACT inhaler Inhale 2 puffs into the lungs every 6 (six) hours as needed for wheezing or shortness of breath.   . ranolazine (RANEXA) 1000 MG SR tablet Take 1 tablet (1,000 mg total) by mouth 2 (two) times daily.  Marland Kitchen rOPINIRole (REQUIP) 1 MG tablet Take 1 mg by mouth at bedtime as needed (for restless leg).  . rosuvastatin (CRESTOR) 5 MG tablet Take 1 tablet (5 mg total)  by mouth daily.  Marland Kitchen zolpidem (AMBIEN) 10 MG tablet Take 1 tablet by mouth at bedtime as needed.  . [DISCONTINUED] ARIPiprazole (ABILIFY) 10 MG tablet Take 1 tablet (10 mg total) by mouth daily. (Patient not taking: Reported on 12/18/2020)  . [DISCONTINUED] benzonatate (TESSALON) 100 MG capsule Take 1 capsule (100 mg total) by mouth every 8 (eight) hours. (Patient not taking: Reported on 12/18/2020)  . [DISCONTINUED] busPIRone (BUSPAR) 15 MG tablet Take 1 tablet (15 mg total) by mouth 3 (three) times daily. (Patient not taking: Reported on 12/18/2020)  . [DISCONTINUED] Vitamin D, Ergocalciferol, (DRISDOL) 1.25 MG (50000 UT) CAPS capsule Take 50,000 Units by mouth once a week. (Patient not taking: Reported on 12/18/2020)   No facility-administered encounter medications on file as of 12/18/2020.    Follow-up: Return in about 2 months (around 02/15/2021) for Physical Exam (no pap).   Noreene Larsson, NP

## 2020-12-18 NOTE — Assessment & Plan Note (Signed)
-  was taking abilify previously, but stopped that about a year ago -she saw a counselor up to last summer; unable to review notes d/t confidentiality issues  -referral to psych for mood disorders

## 2020-12-18 NOTE — Assessment & Plan Note (Signed)
-  BP elevated today -takes amlodipine and lasix

## 2020-12-18 NOTE — Assessment & Plan Note (Signed)
-  she stated that she liked the albuterol better than the symbicort -we discussed that albuterol is a rescue inhaler, whereas symbicort is for maintenance therapy -she states she will use symbicort BID as prescribed

## 2020-12-18 NOTE — Assessment & Plan Note (Signed)
-  takes esomeprazole -no issues today

## 2020-12-18 NOTE — Assessment & Plan Note (Signed)
-  she no longer wears CPAP, likely contributory to her elevated BP

## 2020-12-18 NOTE — Assessment & Plan Note (Signed)
-  related to shift work; her shifts swap each week from night/day -takes [place] when she works nights -discussed dangers of mixing this with xanax -defer treatment to psych

## 2020-12-18 NOTE — Assessment & Plan Note (Signed)
-  uses flexeril and gabapentin PRN

## 2020-12-31 ENCOUNTER — Ambulatory Visit: Payer: BC Managed Care – PPO | Admitting: Neurology

## 2021-01-05 ENCOUNTER — Telehealth: Payer: Self-pay | Admitting: Adult Health

## 2021-01-05 ENCOUNTER — Other Ambulatory Visit: Payer: Self-pay | Admitting: Nurse Practitioner

## 2021-01-05 ENCOUNTER — Other Ambulatory Visit: Payer: Self-pay | Admitting: Adult Health

## 2021-01-05 DIAGNOSIS — F411 Generalized anxiety disorder: Secondary | ICD-10-CM

## 2021-01-05 MED ORDER — ALPRAZOLAM 0.5 MG PO TABS: 0.5000 mg | ORAL_TABLET | Freq: Three times a day (TID) | ORAL | 2 refills | Status: DC | PRN

## 2021-01-05 NOTE — Telephone Encounter (Signed)
Error

## 2021-01-05 NOTE — Telephone Encounter (Signed)
Next visit is 01/23/21. Requesting refill on Xanax 0.5 mg called to:  [facility]. - [place], [place] - [address] Phone:  [phone]  Fax:  [phone]

## 2021-01-08 ENCOUNTER — Other Ambulatory Visit: Payer: Self-pay | Admitting: Nurse Practitioner

## 2021-01-08 MED ORDER — OMEPRAZOLE 40 MG PO CPDR: 40.0000 mg | DELAYED_RELEASE_CAPSULE | Freq: Every day | ORAL | 1 refills | Status: DC

## 2021-01-15 ENCOUNTER — Other Ambulatory Visit: Payer: Self-pay | Admitting: Nurse Practitioner

## 2021-01-15 MED ORDER — PANTOPRAZOLE SODIUM 40 MG PO TBEC: 40.0000 mg | DELAYED_RELEASE_TABLET | Freq: Every day | ORAL | 3 refills | Status: DC

## 2021-01-23 ENCOUNTER — Encounter: Payer: Self-pay | Admitting: Adult Health

## 2021-01-23 ENCOUNTER — Ambulatory Visit (INDEPENDENT_AMBULATORY_CARE_PROVIDER_SITE_OTHER): Payer: BC Managed Care – PPO | Admitting: Adult Health

## 2021-01-23 ENCOUNTER — Other Ambulatory Visit: Payer: Self-pay

## 2021-01-23 DIAGNOSIS — G4700 Insomnia, unspecified: Secondary | ICD-10-CM | POA: Diagnosis not present

## 2021-01-23 DIAGNOSIS — F909 Attention-deficit hyperactivity disorder, unspecified type: Secondary | ICD-10-CM

## 2021-01-23 DIAGNOSIS — F331 Major depressive disorder, recurrent, moderate: Secondary | ICD-10-CM | POA: Diagnosis not present

## 2021-01-23 DIAGNOSIS — F319 Bipolar disorder, unspecified: Secondary | ICD-10-CM | POA: Diagnosis not present

## 2021-01-23 DIAGNOSIS — F4310 Post-traumatic stress disorder, unspecified: Secondary | ICD-10-CM

## 2021-01-23 DIAGNOSIS — F411 Generalized anxiety disorder: Secondary | ICD-10-CM

## 2021-01-23 NOTE — Progress Notes (Signed)
[name] [id number] 23-Jan-1966 55 y.o.  Subjective:   Patient ID:  [name] is a 55 y.o. (DOB 1965/11/24) female.  Chief Complaint: No chief complaint on file.   HPI [name] presents to the office today for follow-up of Bipolar 1 disorder, ADHD, MDD, GAD, PTSD, and insomnia.  Describes mood today as "ok". Pleasant. Mood symptoms - reports decreased depression, anxiety, and irritability. Stating "I am doing better". Decided to return to work. Improved interest and motivation. Feels like current medications are working well for her. Taking medications as prescribed.  Energy levels "low". Active, does not have a regular exercise routine.  Enjoys some usual interests and activities. Married. Lives with husband and dog - oldest dog passed away - plans to get a new dog. Has 2 grown children. Spending time with family. Appetite adequate. Weight loss - 140 pounds. Sleeping better some nights than others - working a swing shift schedule. 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 Focus and concentration difficulties in both work and home setting - "it has been going on for years, but worse lately" Completing tasks. Managing aspects of household. Working 3 - 12 hour shifts. Denies SI or HI.  Denies AH or VH.   Past trials of medication:Sertraline, Paxil, Fluoxetine, Lexapro, Wellbutrin, Abilify, Buspar      PHQ2-9   Flowsheet Row Office Visit from 12/18/2020 in [facility]  PHQ-2 Total Score 6  PHQ-9 Total Score 11    Flowsheet Row ED from 12/01/[facility] in [facility] EMERGENCY DEPARTMENT  C-SSRS RISK CATEGORY Error: Question 6 not populated       Review of Systems:  Review of Systems  Musculoskeletal: Negative for gait problem.  Neurological: Negative for tremors.  Psychiatric/Behavioral:       Please refer to HPI    Medications: I have reviewed the patient's current medications.  Current Outpatient Medications  Medication Sig Dispense Refill  .  ALPRAZolam (XANAX) 0.5 MG tablet Take 1 tablet (0.5 mg total) by mouth 3 (three) times daily as needed. 90 tablet 2  . amLODipine (NORVASC) 10 MG tablet Take 1 tablet (10 mg total) by mouth daily. 90 tablet 3  . aspirin EC 81 MG tablet Take 81 mg by mouth daily.    . budesonide-formoterol (SYMBICORT) 160-4.5 MCG/ACT inhaler Inhale 2 puffs into the lungs 2 (two) times daily.    . cyclobenzaprine (FLEXERIL) 10 MG tablet Take 10 mg by mouth 2 (two) times daily as needed for muscle spasms.     . diphenoxylate-atropine (LOMOTIL) 2.5-0.025 MG tablet Take 2.5 mg by mouth daily as needed.     Marland Kitchen FLUoxetine (PROZAC) 40 MG capsule TAKE TWO CAPSULES BY MOUTH ONCE DAILY 180 capsule 2  . furosemide (LASIX) 40 MG tablet Take 1 tablet (40 mg total) by mouth daily. 30 tablet 0  . gabapentin (NEURONTIN) 300 MG capsule Take 1 capsule by mouth 4 (four) times daily.    Marland Kitchen ibuprofen (ADVIL) 800 MG tablet Take 800 mg by mouth every 8 (eight) hours as needed.    . nitroGLYCERIN (NITROSTAT) 0.4 MG SL tablet Place 1 tablet (0.4 mg total) under the tongue every 5 (five) minutes x 3 doses as needed for chest pain (if no relief after 3rd dose, proceed to the ED for an evaluation). 25 tablet 3  . ondansetron (ZOFRAN) 4 MG tablet Take 4 mg by mouth every 8 (eight) hours as needed.    . pantoprazole (PROTONIX) 40 MG tablet Take 1 tablet (  40 mg total) by mouth daily. 30 tablet 3  . potassium chloride (KLOR-CON) 20 MEQ packet Take 20 mEq by mouth 2 (two) times daily. 60 tablet 6  . PROAIR HFA 108 (90 Base) MCG/ACT inhaler Inhale 2 puffs into the lungs every 6 (six) hours as needed for wheezing or shortness of breath.   3  . ranolazine (RANEXA) 1000 MG SR tablet Take 1 tablet (1,000 mg total) by mouth 2 (two) times daily. 180 tablet 3  . rOPINIRole (REQUIP) 1 MG tablet Take 1 mg by mouth at bedtime as needed (for restless leg).    . rosuvastatin (CRESTOR) 5 MG tablet Take 1 tablet (5 mg total) by mouth daily. 90 tablet 3  . zolpidem  (AMBIEN) 10 MG tablet TAKE 1 TABLET BY MOUTH EVERY EVENING AT BEDTIME 90 tablet 0   No current facility-administered medications for this visit.    Medication Side Effects: None  Allergies:  Allergies  Allergen Reactions  . Other   . Imdur [Isosorbide Nitrate] Other (See Comments)    Headaches; Worsening Chest Pain    Past Medical History:  Diagnosis Date  . ADD (attention deficit disorder with hyperactivity)   . Anxiety   . Bipolar 1 disorder (HCC)   . Carotid artery disease (HCC)    Right 50-69% stenosis by doppler 2010  . Chronic diastolic (congestive) heart failure (HCC)   . COPD (chronic obstructive pulmonary disease) (HCC)    Phreesia 12/15/2020  . Coronary artery disease    a. s/p CABG in 2009 with LIMA-LAD, SVG-RI, and SVG-RCA b. low-risk NST in 10/2015 c. low-risk NST in 12/2018.  Marland Kitchen CVA (cerebral infarction) 12/2008   Reports a neurologist told her she had a blood clot in her brain  . Depression   . Depression    Phreesia 12/15/2020  . Emphysema of lung (HCC) 06/19/2018  . Fibromyalgia   . GERD (gastroesophageal reflux disease)   . Hyperlipidemia   . Hypertension   . Myocardial infarction (HCC) 09/18/2011   Phreesia 12/15/2020  . OSA on CPAP 12/31/2018  . Sleep apnea    Phreesia 12/15/2020  . Stroke [facility])    Phreesia 12/15/2020  . Tobacco abuse     Family History  Problem Relation Age of Onset  . CAD Mother   . Intellectual disability Maternal Aunt   . Intellectual disability Maternal Uncle   . Alcohol abuse Paternal Grandfather     Social History   Socioeconomic History  . Marital status: Married    Spouse name: Not on file  . Number of children: Not on file  . Years of education: Not on file  . Highest education level: Not on file  Occupational History  . Occupation: [employer]    Comment: Investment banker, operational- in Set designer  Tobacco Use  . Smoking status: Former Smoker    Packs/day: 0.50    Years: 35.00    Pack years: 17.50    Types:  Cigarettes    Start date: 12/14/1981    Quit date: 12/17/2018    Years since quitting: 2.1  . Smokeless tobacco: Never Used  Vaping Use  . Vaping Use: Every day  . Substances: Nicotine, Flavoring  Substance and Sexual Activity  . Alcohol use: Not Currently    Comment: rarely- had a margarita on birthday  . Drug use: No  . Sexual activity: Yes    Birth control/protection: Post-menopausal  Other Topics Concern  . Not on file  Social History Narrative   Married with  2 children who are grown; has pets   Social Determinants of Corporate investment banker Strain: Not on file  Food Insecurity: Not on file  Transportation Needs: Not on file  Physical Activity: Not on file  Stress: Not on file  Social Connections: Not on file  Intimate Partner Violence: Not on file    Past Medical History, Surgical history, Social history, and Family history were reviewed and updated as appropriate.   Please see review of systems for further details on the patient's review from today.   Objective:   Physical Exam:  LMP 10/22/2015   Physical Exam Constitutional:      General: She is not in acute distress. Musculoskeletal:        General: No deformity.  Neurological:     Mental Status: She is alert and oriented to person, place, and time.     Coordination: Coordination normal.  Psychiatric:        Attention and Perception: Attention and perception normal. She does not perceive auditory or visual hallucinations.        Mood and Affect: Mood normal. Mood is not anxious or depressed. Affect is not labile, blunt, angry or inappropriate.        Speech: Speech normal.        Behavior: Behavior normal.        Thought Content: Thought content normal. Thought content is not paranoid or delusional. Thought content does not include homicidal or suicidal ideation. Thought content does not include homicidal or suicidal plan.        Cognition and Memory: Cognition and memory normal.        Judgment:  Judgment normal.     Comments: Insight intact     Lab Review:     Component Value Date/Time   NA 135 12/01/2020 1324   K 3.7 12/01/2020 1324   CL 103 12/01/2020 1324   CO2 24 12/01/2020 1324   GLUCOSE 86 12/01/2020 1324   BUN 12 12/01/2020 1324   CREATININE 0.50 12/01/2020 1324   CALCIUM 9.2 12/01/2020 1324   PROT 6.7 03/01/2012 0450   ALBUMIN 2.9 (L) 03/01/2012 0450   AST 15 03/01/2012 0450   ALT 11 03/01/2012 0450   ALKPHOS 90 03/01/2012 0450   BILITOT 0.2 (L) 03/01/2012 0450   GFRNONAA >60 12/01/2020 1324   GFRAA >60 10/07/2019 1040       Component Value Date/Time   WBC 6.3 12/01/2020 1324   RBC 4.28 12/01/2020 1324   HGB 12.0 12/01/2020 1324   HCT 38.4 12/01/2020 1324   PLT 333 12/01/2020 1324   MCV 89.7 12/01/2020 1324   MCH 28.0 12/01/2020 1324   MCHC 31.3 12/01/2020 1324   RDW 14.5 12/01/2020 1324   LYMPHSABS 2.3 12/01/2020 1324   MONOABS 0.4 12/01/2020 1324   EOSABS 0.0 12/01/2020 1324   BASOSABS 0.0 12/01/2020 1324    No results found for: POCLITH, LITHIUM   No results found for: PHENYTOIN, PHENOBARB, VALPROATE, CBMZ   .res Assessment: Plan:    Plan:  PDMP reviewed  1. Continue Fluoxetine 80 mg daily  2. Discontinue Abilify 10 mg daily- not taking 3. Continue Buspar 15 mg three times a day  4. Continue Xanax 0.5mg  TID prn anxiety - not taking 3 times a day  Ambien as needed  Has seen a therapist in the past  RTC 3 months  Patient advised to contact office with any questions, adverse effects, or acute worsening in signs and symptoms.  Discussed potential  benefits, risk, and side effects of benzodiazepines to include potential risk of tolerance and dependence, as well as possible drowsiness.  Advised patient not to drive if experiencing drowsiness and to take lowest possible effective dose to minimize risk of dependence and tolerance.  Discussed potential metabolic side effects associated with atypical antipsychotics, as well as potential  risk for movement side effects. Advised pt to contact office if movement side effects occur.      Diagnoses and all orders for this visit:  Generalized anxiety disorder  Bipolar I disorder (HCC)  Insomnia, unspecified type  Major depressive disorder, recurrent episode, moderate (HCC)  Attention deficit hyperactivity disorder (ADHD), unspecified ADHD type  PTSD (post-traumatic stress disorder)     Please see After Visit Summary for patient specific instructions.  Future Appointments  Date Time Provider Department Center  02/03/2021  8:40 AM Heather RobertsGray, Joseph M, NP [facility]  04/16/2021 10:20 AM Jasalyn Frysinger, Thereasa Soloegina Nattalie, NP [facility] None    No orders of the defined types were placed in this encounter.   -------------------------------

## 2021-01-29 DIAGNOSIS — Z7689 Persons encountering health services in other specified circumstances: Secondary | ICD-10-CM | POA: Diagnosis not present

## 2021-01-29 DIAGNOSIS — Z139 Encounter for screening, unspecified: Secondary | ICD-10-CM | POA: Diagnosis not present

## 2021-01-29 DIAGNOSIS — E559 Vitamin D deficiency, unspecified: Secondary | ICD-10-CM | POA: Diagnosis not present

## 2021-01-30 LAB — CMP14+EGFR
ALT: 13 IU/L (ref 0–32)
AST: 21 IU/L (ref 0–40)
Albumin/Globulin Ratio: 1.5 (ref 1.2–2.2)
Albumin: 4.3 g/dL (ref 3.8–4.9)
Alkaline Phosphatase: 106 IU/L (ref 44–121)
BUN/Creatinine Ratio: 19 (ref 9–23)
BUN: 14 mg/dL (ref 6–24)
Bilirubin Total: 0.3 mg/dL (ref 0.0–1.2)
CO2: 22 mmol/L (ref 20–29)
Calcium: 9.4 mg/dL (ref 8.7–10.2)
Chloride: 103 mmol/L (ref 96–106)
Creatinine, Ser: 0.73 mg/dL (ref 0.57–1.00)
Globulin, Total: 2.9 g/dL (ref 1.5–4.5)
Glucose: 90 mg/dL (ref 65–99)
Potassium: 4.1 mmol/L (ref 3.5–5.2)
Sodium: 139 mmol/L (ref 134–144)
Total Protein: 7.2 g/dL (ref 6.0–8.5)
eGFR: 97 mL/min/{1.73_m2} (ref >59)

## 2021-01-30 LAB — CBC WITH DIFFERENTIAL/PLATELET
Basophils Absolute: 0 10*3/uL (ref 0.0–0.2)
Basos: 1 %
EOS (ABSOLUTE): 0.1 10*3/uL (ref 0.0–0.4)
Eos: 2 %
Hematocrit: 33.2 % — ABNORMAL LOW (ref 34.0–46.6)
Hemoglobin: 11 g/dL — ABNORMAL LOW (ref 11.1–15.9)
Immature Grans (Abs): 0 10*3/uL (ref 0.0–0.1)
Immature Granulocytes: 0 %
Lymphocytes Absolute: 3.1 10*3/uL (ref 0.7–3.1)
Lymphs: 49 %
MCH: 28.3 pg (ref 26.6–33.0)
MCHC: 33.1 g/dL (ref 31.5–35.7)
MCV: 85 fL (ref 79–97)
Monocytes Absolute: 0.3 10*3/uL (ref 0.1–0.9)
Monocytes: 5 %
Neutrophils Absolute: 2.6 10*3/uL (ref 1.4–7.0)
Neutrophils: 43 %
Platelets: 334 10*3/uL (ref 150–450)
RBC: 3.89 x10E6/uL (ref 3.77–5.28)
RDW: 12.9 % (ref 11.7–15.4)
WBC: 6.1 10*3/uL (ref 3.4–10.8)

## 2021-01-30 LAB — LIPID PANEL WITH LDL/HDL RATIO
Cholesterol, Total: 214 mg/dL — ABNORMAL HIGH (ref 100–199)
HDL: 57 mg/dL (ref >39)
LDL Chol Calc (NIH): 138 mg/dL — ABNORMAL HIGH (ref 0–99)
LDL/HDL Ratio: 2.4 ratio (ref 0.0–3.2)
Triglycerides: 108 mg/dL (ref 0–149)
VLDL Cholesterol Cal: 19 mg/dL (ref 5–40)

## 2021-01-30 LAB — HCV INTERPRETATION

## 2021-01-30 LAB — HCV AB W/RFLX TO VERIFICATION: HCV Ab: <0.1 s/co ratio (ref 0.0–0.9)

## 2021-01-30 NOTE — Progress Notes (Signed)
Her cholesterol is a little high, and her Hemoglobin is a tenth of a point low. We will talk about these at her appt on 4/12. Has she noticed any bleeding or dark, tarry stools?

## 2021-01-30 NOTE — Progress Notes (Signed)
I'll get [place] to add it. I don't always add those with routine labs because they are expensive, and if they are taking a daily supplement, it is probably normal.

## 2021-02-02 LAB — CALCITRIOL (1,25 DI-OH VIT D): Vit D, 1,25-Dihydroxy: 43.9 pg/mL (ref 19.9–79.3)

## 2021-02-02 LAB — SPECIMEN STATUS REPORT

## 2021-02-02 NOTE — Progress Notes (Signed)
Vitamin D is in the normal range

## 2021-02-03 ENCOUNTER — Ambulatory Visit: Payer: BC Managed Care – PPO | Admitting: Nurse Practitioner

## 2021-02-12 ENCOUNTER — Ambulatory Visit: Payer: BC Managed Care – PPO | Admitting: Nurse Practitioner

## 2021-03-19 ENCOUNTER — Telehealth: Payer: Self-pay

## 2021-03-19 ENCOUNTER — Other Ambulatory Visit: Payer: Self-pay

## 2021-03-19 DIAGNOSIS — F5102 Adjustment insomnia: Secondary | ICD-10-CM

## 2021-03-19 DIAGNOSIS — G2581 Restless legs syndrome: Secondary | ICD-10-CM

## 2021-03-19 DIAGNOSIS — I10 Essential (primary) hypertension: Secondary | ICD-10-CM

## 2021-03-19 DIAGNOSIS — F419 Anxiety disorder, unspecified: Secondary | ICD-10-CM

## 2021-03-19 MED ORDER — AMLODIPINE BESYLATE 10 MG PO TABS: 10.0000 mg | ORAL_TABLET | Freq: Every day | ORAL | 3 refills | Status: DC

## 2021-03-19 MED ORDER — FLUOXETINE HCL 40 MG PO CAPS: ORAL_CAPSULE | ORAL | 2 refills | Status: DC

## 2021-03-19 MED ORDER — ROPINIROLE HCL 1 MG PO TABS: 1.0000 mg | ORAL_TABLET | Freq: Every evening | ORAL | 2 refills | Status: DC | PRN

## 2021-03-19 MED ORDER — ZOLPIDEM TARTRATE 10 MG PO TABS: ORAL_TABLET | ORAL | 0 refills | Status: DC

## 2021-03-19 NOTE — Telephone Encounter (Signed)
She gets alprazolam from psychiatry. We don't fill that.

## 2021-03-19 NOTE — Telephone Encounter (Signed)
Patient called need med refills.   zolpidem (AMBIEN) 10 MG tablet FLUoxetine (PROZAC) 40 MG  Esomeprazole 40 mg rOPINIRole (REQUIP) 1 MG tablet Amlodipine 5 mg Alprazolam 0.5 mg  [facility] [place] [facility]  [phone]  Patient would like to change her pharmacy to [facility] instead of [facility]

## 2021-03-19 NOTE — Telephone Encounter (Signed)
I sent everything except the Alprazolam it will not let me authorize.

## 2021-03-20 ENCOUNTER — Telehealth: Payer: Self-pay

## 2021-03-20 NOTE — Telephone Encounter (Signed)
Patient called need this one medicine refill called into [facility].   rOPINIRole (REQUIP) 1 MG tablet

## 2021-03-24 ENCOUNTER — Other Ambulatory Visit: Payer: Self-pay

## 2021-03-24 DIAGNOSIS — G2581 Restless legs syndrome: Secondary | ICD-10-CM

## 2021-03-24 MED ORDER — ROPINIROLE HCL 1 MG PO TABS: 1.0000 mg | ORAL_TABLET | Freq: Every evening | ORAL | 2 refills | Status: DC | PRN

## 2021-03-24 NOTE — Telephone Encounter (Signed)
Patient needs this medicine Ropinrole 1 mg to be sent to [facility]

## 2021-03-24 NOTE — Telephone Encounter (Signed)
Rx refilled.

## 2021-03-24 NOTE — Telephone Encounter (Signed)
Re-sent to [facility].

## 2021-03-31 ENCOUNTER — Ambulatory Visit: Payer: BC Managed Care – PPO | Admitting: Nurse Practitioner

## 2021-03-31 ENCOUNTER — Other Ambulatory Visit: Payer: Self-pay

## 2021-03-31 ENCOUNTER — Telehealth: Payer: Self-pay

## 2021-03-31 ENCOUNTER — Encounter: Payer: Self-pay | Admitting: Nurse Practitioner

## 2021-03-31 VITALS — BP 135/75 | HR 54 | Temp 98.9°F | Resp 18 | Ht 62.0 in | Wt 140.0 lb

## 2021-03-31 DIAGNOSIS — Z0000 Encounter for general adult medical examination without abnormal findings: Secondary | ICD-10-CM

## 2021-03-31 DIAGNOSIS — J019 Acute sinusitis, unspecified: Secondary | ICD-10-CM

## 2021-03-31 DIAGNOSIS — J329 Chronic sinusitis, unspecified: Secondary | ICD-10-CM | POA: Insufficient documentation

## 2021-03-31 DIAGNOSIS — F319 Bipolar disorder, unspecified: Secondary | ICD-10-CM

## 2021-03-31 DIAGNOSIS — H6123 Impacted cerumen, bilateral: Secondary | ICD-10-CM | POA: Diagnosis not present

## 2021-03-31 DIAGNOSIS — F419 Anxiety disorder, unspecified: Secondary | ICD-10-CM

## 2021-03-31 DIAGNOSIS — H6120 Impacted cerumen, unspecified ear: Secondary | ICD-10-CM | POA: Insufficient documentation

## 2021-03-31 DIAGNOSIS — F410 Panic disorder [episodic paroxysmal anxiety] without agoraphobia: Secondary | ICD-10-CM

## 2021-03-31 DIAGNOSIS — Z0001 Encounter for general adult medical examination with abnormal findings: Secondary | ICD-10-CM

## 2021-03-31 DIAGNOSIS — J439 Emphysema, unspecified: Secondary | ICD-10-CM

## 2021-03-31 MED ORDER — AZITHROMYCIN 250 MG PO TABS: ORAL_TABLET | ORAL | 0 refills | Status: DC

## 2021-03-31 MED ORDER — PROAIR RESPICLICK 108 (90 BASE) MCG/ACT IN AEPB: 2.0000 | INHALATION_SPRAY | Freq: Four times a day (QID) | RESPIRATORY_TRACT | 5 refills | Status: DC | PRN

## 2021-03-31 NOTE — Assessment & Plan Note (Signed)
-  Rx. Azithromycin -no evidence of ear infection

## 2021-03-31 NOTE — Telephone Encounter (Signed)
Medication was [facility] to [facility] in [place] && please send to [facility] IN [place]

## 2021-03-31 NOTE — Assessment & Plan Note (Signed)
refilled albuterol inhaler

## 2021-03-31 NOTE — Assessment & Plan Note (Signed)
-  managed by psychiatry -psych stopped abilify d/t not taking it at previous visit

## 2021-03-31 NOTE — Telephone Encounter (Signed)
Rx for albuterol re-sent to [facility].

## 2021-03-31 NOTE — Progress Notes (Signed)
Established Patient Office Visit  Subjective:  Patient ID: [name], female    DOB: 1966-08-29  Age: 55 y.o. MRN: [id number]  CC:  Chief Complaint  Patient presents with  . Otalgia    L ear pain x 4 days   . Follow-up    Go over lab work     HPI [name] presents for left ear pain.  This started on 03/26/21. She took some old amoxicillin. She is unsure if this is ear pain or throat pain.  She wears earplugs at work.  She recently had diarrhea, but this resolved Sunday.     Past Medical History:  Diagnosis Date  . ADD (attention deficit disorder with hyperactivity)   . Anxiety   . Bipolar 1 disorder (HCC)   . Carotid artery disease (HCC)    Right 50-69% stenosis by doppler 2010  . Chronic diastolic (congestive) heart failure (HCC)   . COPD (chronic obstructive pulmonary disease) (HCC)    Phreesia 12/15/2020  . Coronary artery disease    a. s/p CABG in 2009 with LIMA-LAD, SVG-RI, and SVG-RCA b. low-risk NST in 10/2015 c. low-risk NST in 12/2018.  Marland Kitchen CVA (cerebral infarction) 12/2008   Reports a neurologist told her she had a blood clot in her brain  . Depression   . Depression    Phreesia 12/15/2020  . Emphysema of lung (HCC) 06/19/2018  . Fibromyalgia   . GERD (gastroesophageal reflux disease)   . Hyperlipidemia   . Hypertension   . Myocardial infarction (HCC) 09/18/2011   Phreesia 12/15/2020  . OSA on CPAP 12/31/2018  . Sleep apnea    Phreesia 12/15/2020  . Stroke [facility])    Phreesia 12/15/2020  . Tobacco abuse     Past Surgical History:  Procedure Laterality Date  . CARDIAC CATHETERIZATION    . CORONARY ARTERY BYPASS GRAFT  2009   3V CABG LIMA --> LAD, SVG --> Ramus Intermediate,  SVG --> RCA  . TUBAL LIGATION  1990    Family History  Problem Relation Age of Onset  . CAD Mother   . Intellectual disability Maternal Aunt   . Intellectual disability Maternal Uncle   . Alcohol abuse Paternal Grandfather     Social History   Socioeconomic  History  . Marital status: Married    Spouse name: Not on file  . Number of children: Not on file  . Years of education: Not on file  . Highest education level: Not on file  Occupational History  . Occupation: [employer]    Comment: Investment banker, operational- in Set designer  Tobacco Use  . Smoking status: Former Smoker    Packs/day: 0.50    Years: 35.00    Pack years: 17.50    Types: Cigarettes    Start date: 12/14/1981    Quit date: 12/17/2018    Years since quitting: 2.2  . Smokeless tobacco: Never Used  Vaping Use  . Vaping Use: Every day  . Substances: Nicotine, Flavoring  Substance and Sexual Activity  . Alcohol use: Not Currently    Comment: rarely- had a margarita on birthday  . Drug use: No  . Sexual activity: Yes    Birth control/protection: Post-menopausal  Other Topics Concern  . Not on file  Social History Narrative   Married with 2 children who are grown; has pets   Social Determinants of Corporate investment banker Strain: Not on file  Food Insecurity: Not on file  Transportation Needs: Not on  file  Physical Activity: Not on file  Stress: Not on file  Social Connections: Not on file  Intimate Partner Violence: Not on file    Outpatient Medications Prior to Visit  Medication Sig Dispense Refill  . ALPRAZolam (XANAX) 0.5 MG tablet Take 1 tablet (0.5 mg total) by mouth 3 (three) times daily as needed. 90 tablet 2  . amLODipine (NORVASC) 10 MG tablet Take 1 tablet (10 mg total) by mouth daily. 90 tablet 3  . aspirin EC 81 MG tablet Take 81 mg by mouth daily.    . budesonide-formoterol (SYMBICORT) 160-4.5 MCG/ACT inhaler Inhale 2 puffs into the lungs 2 (two) times daily.    . cyclobenzaprine (FLEXERIL) 10 MG tablet Take 10 mg by mouth 2 (two) times daily as needed for muscle spasms.     . diphenoxylate-atropine (LOMOTIL) 2.5-0.025 MG tablet Take 2.5 mg by mouth daily as needed.     Marland Kitchen FLUoxetine (PROZAC) 40 MG capsule TAKE TWO CAPSULES BY MOUTH ONCE DAILY 180  capsule 2  . furosemide (LASIX) 40 MG tablet Take 1 tablet (40 mg total) by mouth daily. 30 tablet 0  . gabapentin (NEURONTIN) 300 MG capsule Take 1 capsule by mouth 4 (four) times daily.    Marland Kitchen ibuprofen (ADVIL) 800 MG tablet Take 800 mg by mouth every 8 (eight) hours as needed.    . nitroGLYCERIN (NITROSTAT) 0.4 MG SL tablet Place 1 tablet (0.4 mg total) under the tongue every 5 (five) minutes x 3 doses as needed for chest pain (if no relief after 3rd dose, proceed to the ED for an evaluation). 25 tablet 3  . ondansetron (ZOFRAN) 4 MG tablet Take 4 mg by mouth every 8 (eight) hours as needed.    . pantoprazole (PROTONIX) 40 MG tablet Take 1 tablet (40 mg total) by mouth daily. 30 tablet 3  . potassium chloride (KLOR-CON) 20 MEQ packet Take 20 mEq by mouth 2 (two) times daily. 60 tablet 6  . ranolazine (RANEXA) 1000 MG SR tablet Take 1 tablet (1,000 mg total) by mouth 2 (two) times daily. 180 tablet 3  . rOPINIRole (REQUIP) 1 MG tablet Take 1 tablet (1 mg total) by mouth at bedtime as needed (for restless leg). 30 tablet 2  . rosuvastatin (CRESTOR) 5 MG tablet Take 1 tablet (5 mg total) by mouth daily. 90 tablet 3  . zolpidem (AMBIEN) 10 MG tablet TAKE 1 TABLET BY MOUTH EVERY EVENING AT BEDTIME 90 tablet 0  . PROAIR HFA 108 (90 Base) MCG/ACT inhaler Inhale 2 puffs into the lungs every 6 (six) hours as needed for wheezing or shortness of breath.   3   No facility-administered medications prior to visit.    Allergies  Allergen Reactions  . Other   . Imdur [Isosorbide Nitrate] Other (See Comments)    Headaches; Worsening Chest Pain    ROS Review of Systems  Constitutional: Negative.   HENT: Positive for ear pain, sinus pressure, sinus pain and sore throat. Negative for congestion, postnasal drip and rhinorrhea.   Respiratory: Negative.   Cardiovascular: Negative.       Objective:    Physical Exam Constitutional:      Appearance: Normal appearance.  HENT:     Right Ear: External ear  normal. There is impacted cerumen.     Left Ear: External ear normal. There is impacted cerumen.  Neurological:     Mental Status: She is alert.  Psychiatric:        Mood and Affect: Mood normal.  Behavior: Behavior normal.        Thought Content: Thought content normal.        Judgment: Judgment normal.     BP 135/75   Pulse (!) 54   Temp 98.9 F (37.2 C)   Resp 18   Ht $R'5\' 2"'ZZ$  (1.575 m)   Wt 140 lb (63.5 kg)   LMP 10/22/2015   SpO2 96%   BMI 25.61 kg/m  Wt Readings from Last 3 Encounters:  03/31/21 140 lb (63.5 kg)  12/18/20 140 lb (63.5 kg)  12/01/20 145 lb (65.8 kg)     Health Maintenance Due  Topic Date Due  . COVID-19 Vaccine (1) Never done  . Pneumococcal Vaccine 24-33 Years old (1 of 2 - PPSV23) Never done  . TETANUS/TDAP  Never done  . PAP SMEAR-Modifier  Never done  . COLONOSCOPY (Pts 45-73yrs Insurance coverage will need to be confirmed)  Never done  . MAMMOGRAM  Never done  . Zoster Vaccines- Shingrix (1 of 2) Never done    There are no preventive care reminders to display for this patient.  Lab Results  Component Value Date   TSH 1.865 12/31/2018   Lab Results  Component Value Date   WBC 6.1 01/29/2021   HGB 11.0 (L) 01/29/2021   HCT 33.2 (L) 01/29/2021   MCV 85 01/29/2021   PLT 334 01/29/2021   Lab Results  Component Value Date   NA 139 01/29/2021   K 4.1 01/29/2021   CO2 22 01/29/2021   GLUCOSE 90 01/29/2021   BUN 14 01/29/2021   CREATININE 0.73 01/29/2021   BILITOT 0.3 01/29/2021   ALKPHOS 106 01/29/2021   AST 21 01/29/2021   ALT 13 01/29/2021   PROT 7.2 01/29/2021   ALBUMIN 4.3 01/29/2021   CALCIUM 9.4 01/29/2021   ANIONGAP 8 12/01/2020   EGFR 97 01/29/2021   Lab Results  Component Value Date   CHOL 214 (H) 01/29/2021   Lab Results  Component Value Date   HDL 57 01/29/2021   Lab Results  Component Value Date   LDLCALC 138 (H) 01/29/2021   Lab Results  Component Value Date   TRIG 108 01/29/2021   Lab Results   Component Value Date   CHOLHDL 4.1 Ratio 09/29/2009   Lab Results  Component Value Date   HGBA1C  10/08/2008    5.8 (NOTE)   The ADA recommends the following therapeutic goal for glycemic   control related to Hgb A1C measurement:   Goal of Therapy:   < 7.0% Hgb A1C   Reference: American Diabetes Association: Clinical Practice   Recommendations 2008, Diabetes Care,  2008, 31:(Suppl 1).      Assessment & Plan:   Problem List Items Addressed This Visit      Respiratory   Emphysema of lung (HCC)   Relevant Medications   Albuterol Sulfate (PROAIR RESPICLICK) 774 (90 Base) MCG/ACT AEPB   azithromycin (ZITHROMAX) 250 MG tablet   Sinusitis    -Rx. Azithromycin -no evidence of ear infection      Relevant Medications   azithromycin (ZITHROMAX) 250 MG tablet     Nervous and Auditory   Cerumen impaction - Primary    -irrigated today -right ear more packed with wax than left, but left ear has more pain -after irrigation, TMs and bony landmarks visible without sign of infeciton         Other   Panic disorder    -managed by psych -reviewed notes today  Anxiety    -refilled albuterol inhaler      Bipolar 1 disorder ([place])    -managed by psychiatry -psych stopped abilify d/t not taking it at previous visit       Other Visit Diagnoses    Annual physical exam       Relevant Orders   CBC with Differential/Platelet   CMP14+EGFR   Lipid Panel With LDL/HDL Ratio      Meds ordered this encounter  Medications  . Albuterol Sulfate (PROAIR RESPICLICK) 997 (90 Base) MCG/ACT AEPB    Sig: Inhale 2 puffs into the lungs every 6 (six) hours as needed.    Dispense:  1 each    Refill:  5  . azithromycin (ZITHROMAX) 250 MG tablet    Sig: Take 2 tablets on day 1, then 1 tablet daily on days 2 through 5    Dispense:  6 tablet    Refill:  0    Follow-up: Return in about 3 months (around 07/01/2021) for Physical Exam (No PAP).    Noreene Larsson, NP

## 2021-03-31 NOTE — Assessment & Plan Note (Signed)
-  managed by psych -reviewed notes today

## 2021-03-31 NOTE — Assessment & Plan Note (Addendum)
-  irrigated today -right ear more packed with wax than left, but left ear has more pain -after irrigation, TMs and bony landmarks visible without sign of infeciton

## 2021-04-01 ENCOUNTER — Other Ambulatory Visit: Payer: Self-pay

## 2021-04-01 ENCOUNTER — Telehealth: Payer: Self-pay

## 2021-04-01 DIAGNOSIS — J019 Acute sinusitis, unspecified: Secondary | ICD-10-CM

## 2021-04-01 MED ORDER — AZITHROMYCIN 250 MG PO TABS: ORAL_TABLET | ORAL | 0 refills | Status: AC

## 2021-04-01 NOTE — Telephone Encounter (Signed)
Rx sent. [facility] removed from chart.

## 2021-04-01 NOTE — Telephone Encounter (Signed)
Patient called can this medicine azithromycin (ZITHROMAX) 250 MG tablet be sent to [facility] instead of [facility].  Patient asked if we would change pharmacy from [facility] to [facility] for future prescriptions.

## 2021-04-16 ENCOUNTER — Ambulatory Visit: Payer: BC Managed Care – PPO | Admitting: Adult Health

## 2021-04-23 ENCOUNTER — Ambulatory Visit: Payer: BC Managed Care – PPO | Admitting: Nurse Practitioner

## 2021-04-23 ENCOUNTER — Other Ambulatory Visit: Payer: Self-pay

## 2021-04-23 ENCOUNTER — Observation Stay (HOSPITAL_COMMUNITY)
Admission: EM | Admit: 2021-04-23 | Discharge: 2021-04-25 | DRG: 287 | Disposition: A | Discharge status: Home / Self Care | Payer: BC Managed Care – PPO | Attending: Cardiology | Admitting: Cardiology

## 2021-04-23 ENCOUNTER — Telehealth: Payer: Self-pay | Admitting: Cardiology

## 2021-04-23 ENCOUNTER — Emergency Department (HOSPITAL_COMMUNITY): Payer: BC Managed Care – PPO

## 2021-04-23 DIAGNOSIS — R072 Precordial pain: Secondary | ICD-10-CM | POA: Diagnosis not present

## 2021-04-23 DIAGNOSIS — Z79899 Other long term (current) drug therapy: Secondary | ICD-10-CM | POA: Insufficient documentation

## 2021-04-23 DIAGNOSIS — I5032 Chronic diastolic (congestive) heart failure: Secondary | ICD-10-CM | POA: Diagnosis present

## 2021-04-23 DIAGNOSIS — R079 Chest pain, unspecified: Secondary | ICD-10-CM | POA: Diagnosis not present

## 2021-04-23 DIAGNOSIS — R0602 Shortness of breath: Secondary | ICD-10-CM | POA: Diagnosis not present

## 2021-04-23 DIAGNOSIS — Z7982 Long term (current) use of aspirin: Secondary | ICD-10-CM | POA: Insufficient documentation

## 2021-04-23 DIAGNOSIS — R110 Nausea: Secondary | ICD-10-CM | POA: Diagnosis not present

## 2021-04-23 DIAGNOSIS — G2581 Restless legs syndrome: Secondary | ICD-10-CM | POA: Diagnosis present

## 2021-04-23 DIAGNOSIS — I200 Unstable angina: Secondary | ICD-10-CM | POA: Diagnosis present

## 2021-04-23 DIAGNOSIS — R0789 Other chest pain: Secondary | ICD-10-CM | POA: Diagnosis not present

## 2021-04-23 DIAGNOSIS — Z8673 Personal history of transient ischemic attack (TIA), and cerebral infarction without residual deficits: Secondary | ICD-10-CM | POA: Insufficient documentation

## 2021-04-23 DIAGNOSIS — I252 Old myocardial infarction: Secondary | ICD-10-CM | POA: Diagnosis not present

## 2021-04-23 DIAGNOSIS — I2582 Chronic total occlusion of coronary artery: Secondary | ICD-10-CM | POA: Diagnosis not present

## 2021-04-23 DIAGNOSIS — T82857A Stenosis of cardiac prosthetic devices, implants and grafts, initial encounter: Secondary | ICD-10-CM | POA: Insufficient documentation

## 2021-04-23 DIAGNOSIS — I2510 Atherosclerotic heart disease of native coronary artery without angina pectoris: Secondary | ICD-10-CM | POA: Diagnosis not present

## 2021-04-23 DIAGNOSIS — Z20822 Contact with and (suspected) exposure to covid-19: Secondary | ICD-10-CM | POA: Diagnosis not present

## 2021-04-23 DIAGNOSIS — Z87891 Personal history of nicotine dependence: Secondary | ICD-10-CM | POA: Diagnosis not present

## 2021-04-23 DIAGNOSIS — I10 Essential (primary) hypertension: Secondary | ICD-10-CM | POA: Diagnosis not present

## 2021-04-23 DIAGNOSIS — I25110 Atherosclerotic heart disease of native coronary artery with unstable angina pectoris: Principal | ICD-10-CM | POA: Insufficient documentation

## 2021-04-23 DIAGNOSIS — I25810 Atherosclerosis of coronary artery bypass graft(s) without angina pectoris: Secondary | ICD-10-CM | POA: Diagnosis not present

## 2021-04-23 DIAGNOSIS — Z8249 Family history of ischemic heart disease and other diseases of the circulatory system: Secondary | ICD-10-CM | POA: Diagnosis not present

## 2021-04-23 DIAGNOSIS — F319 Bipolar disorder, unspecified: Secondary | ICD-10-CM | POA: Diagnosis not present

## 2021-04-23 DIAGNOSIS — F909 Attention-deficit hyperactivity disorder, unspecified type: Secondary | ICD-10-CM | POA: Insufficient documentation

## 2021-04-23 DIAGNOSIS — I110 Hypertensive heart disease with heart failure: Secondary | ICD-10-CM | POA: Diagnosis not present

## 2021-04-23 DIAGNOSIS — Z810 Family history of intellectual disabilities: Secondary | ICD-10-CM | POA: Diagnosis not present

## 2021-04-23 DIAGNOSIS — J449 Chronic obstructive pulmonary disease, unspecified: Secondary | ICD-10-CM | POA: Diagnosis not present

## 2021-04-23 DIAGNOSIS — Z951 Presence of aortocoronary bypass graft: Secondary | ICD-10-CM

## 2021-04-23 DIAGNOSIS — M797 Fibromyalgia: Secondary | ICD-10-CM | POA: Diagnosis not present

## 2021-04-23 DIAGNOSIS — E785 Hyperlipidemia, unspecified: Secondary | ICD-10-CM | POA: Diagnosis not present

## 2021-04-23 HISTORY — DX: Chest pain, unspecified: R07.9

## 2021-04-23 LAB — BASIC METABOLIC PANEL
Anion gap: 10 (ref 5–15)
BUN: 11 mg/dL (ref 6–20)
CO2: 26 mmol/L (ref 22–32)
Calcium: 9.5 mg/dL (ref 8.9–10.3)
Chloride: 102 mmol/L (ref 98–111)
Creatinine, Ser: 0.62 mg/dL (ref 0.44–1.00)
GFR, Estimated: >60 mL/min (ref >60)
Glucose, Bld: 97 mg/dL (ref 70–99)
Potassium: 3.7 mmol/L (ref 3.5–5.1)
Sodium: 138 mmol/L (ref 135–145)

## 2021-04-23 LAB — CBC
HCT: 36.8 % (ref 36.0–46.0)
Hemoglobin: 11.6 g/dL — ABNORMAL LOW (ref 12.0–15.0)
MCH: 28.4 pg (ref 26.0–34.0)
MCHC: 31.5 g/dL (ref 30.0–36.0)
MCV: 90.2 fL (ref 80.0–100.0)
Platelets: 377 10*3/uL (ref 150–400)
RBC: 4.08 MIL/uL (ref 3.87–5.11)
RDW: 13.8 % (ref 11.5–15.5)
WBC: 7 10*3/uL (ref 4.0–10.5)
nRBC: 0 % (ref 0.0–0.2)

## 2021-04-23 LAB — RESP PANEL BY RT-PCR (FLU A&B, COVID) ARPGX2
Influenza A by PCR: NEGATIVE
Influenza B by PCR: NEGATIVE
SARS Coronavirus 2 by RT PCR: NEGATIVE

## 2021-04-23 LAB — TROPONIN I (HIGH SENSITIVITY)
Troponin I (High Sensitivity): 3 ng/L (ref <18)
Troponin I (High Sensitivity): 3 ng/L (ref <18)

## 2021-04-23 MED ORDER — ACETAMINOPHEN 325 MG PO TABS
650.0000 mg | ORAL_TABLET | Freq: Four times a day (QID) | ORAL | Status: DC | PRN
  Filled 2021-04-23: qty 2

## 2021-04-23 MED ORDER — ONDANSETRON HCL 4 MG PO TABS: 4.0000 mg | ORAL_TABLET | Freq: Four times a day (QID) | ORAL | Status: DC | PRN

## 2021-04-23 MED ORDER — AMLODIPINE BESYLATE 10 MG PO TABS
10.0000 mg | ORAL_TABLET | Freq: Every day | ORAL | Status: DC
  Administered 2021-04-24 – 2021-04-25 (×2): 10 mg via ORAL
  Filled 2021-04-23 (×2): qty 1

## 2021-04-23 MED ORDER — ENOXAPARIN SODIUM 40 MG/0.4ML IJ SOSY
40.0000 mg | PREFILLED_SYRINGE | INTRAMUSCULAR | Status: DC
  Administered 2021-04-24 – 2021-04-25 (×2): 40 mg via SUBCUTANEOUS
  Filled 2021-04-23 (×2): qty 0.4

## 2021-04-23 MED ORDER — PANTOPRAZOLE SODIUM 40 MG PO TBEC
40.0000 mg | DELAYED_RELEASE_TABLET | Freq: Every day | ORAL | Status: DC
  Administered 2021-04-24 – 2021-04-25 (×2): 40 mg via ORAL
  Filled 2021-04-23 (×2): qty 1

## 2021-04-23 MED ORDER — FLUOXETINE HCL 20 MG PO CAPS
40.0000 mg | ORAL_CAPSULE | Freq: Two times a day (BID) | ORAL | Status: DC
  Administered 2021-04-24 – 2021-04-25 (×3): 40 mg via ORAL
  Filled 2021-04-23 (×3): qty 2

## 2021-04-23 MED ORDER — ROPINIROLE HCL 0.5 MG PO TABS
1.0000 mg | ORAL_TABLET | Freq: Every evening | ORAL | Status: DC | PRN
  Administered 2021-04-24: 1 mg via ORAL
  Filled 2021-04-23: qty 2

## 2021-04-23 MED ORDER — ACETAMINOPHEN 650 MG RE SUPP: 650.0000 mg | Freq: Four times a day (QID) | RECTAL | Status: DC | PRN

## 2021-04-23 MED ORDER — POLYETHYLENE GLYCOL 3350 17 G PO PACK: 17.0000 g | PACK | Freq: Every day | ORAL | Status: DC | PRN

## 2021-04-23 MED ORDER — ONDANSETRON HCL 4 MG/2ML IJ SOLN: 4.0000 mg | Freq: Four times a day (QID) | INTRAMUSCULAR | Status: DC | PRN

## 2021-04-23 MED ORDER — FUROSEMIDE 40 MG PO TABS: 40.0000 mg | ORAL_TABLET | Freq: Every day | ORAL | Status: DC

## 2021-04-23 MED ORDER — RANOLAZINE ER 500 MG PO TB12
1000.0000 mg | ORAL_TABLET | Freq: Two times a day (BID) | ORAL | Status: DC
  Administered 2021-04-24 – 2021-04-25 (×3): 1000 mg via ORAL
  Filled 2021-04-23 (×3): qty 2

## 2021-04-23 MED ORDER — CYCLOBENZAPRINE HCL 10 MG PO TABS
10.0000 mg | ORAL_TABLET | Freq: Once | ORAL | Status: AC
  Administered 2021-04-23: 10 mg via ORAL
  Filled 2021-04-23: qty 1

## 2021-04-23 MED ORDER — ALPRAZOLAM 0.5 MG PO TABS
0.5000 mg | ORAL_TABLET | Freq: Three times a day (TID) | ORAL | Status: DC | PRN
  Administered 2021-04-24 – 2021-04-25 (×2): 0.5 mg via ORAL
  Filled 2021-04-23 (×3): qty 1

## 2021-04-23 MED ORDER — ROSUVASTATIN CALCIUM 5 MG PO TABS: 5.0000 mg | ORAL_TABLET | Freq: Every day | ORAL | Status: DC

## 2021-04-23 MED ORDER — ASPIRIN EC 81 MG PO TBEC
81.0000 mg | DELAYED_RELEASE_TABLET | Freq: Every day | ORAL | Status: DC
  Administered 2021-04-25: 81 mg via ORAL
  Filled 2021-04-23: qty 1

## 2021-04-23 MED ORDER — POTASSIUM CHLORIDE 20 MEQ PO PACK
20.0000 meq | PACK | Freq: Two times a day (BID) | ORAL | Status: DC
  Administered 2021-04-24 (×2): 20 meq via ORAL
  Filled 2021-04-23 (×3): qty 1

## 2021-04-23 MED ORDER — ASPIRIN 325 MG PO TABS
325.0000 mg | ORAL_TABLET | Freq: Once | ORAL | Status: AC
  Administered 2021-04-23: 325 mg via ORAL
  Filled 2021-04-23: qty 1

## 2021-04-23 MED ORDER — MOMETASONE FURO-FORMOTEROL FUM 200-5 MCG/ACT IN AERO
2.0000 | INHALATION_SPRAY | Freq: Two times a day (BID) | RESPIRATORY_TRACT | Status: DC
  Administered 2021-04-24 (×2): 2 via RESPIRATORY_TRACT
  Filled 2021-04-23 (×2): qty 8.8

## 2021-04-23 NOTE — ED Notes (Signed)
Lab reported that second trop has been drawn.

## 2021-04-23 NOTE — Telephone Encounter (Signed)
New message     Pt c/o of Chest Pain: STAT if CP now or developed within 24 hours  1. Are you having CP right now   2. Are you experiencing any other symptoms (ex. SOB, nausea, vomiting, sweating)? Left shoulder pain, left chest pain , sob, nausea  , sweating   3. How long have you been experiencing CP? Since last night   4. Is your CP continuous or coming and going continuous   5. Have you taken Nitroglycerin yes its calmed down but it hasnt gone away ?

## 2021-04-23 NOTE — Telephone Encounter (Signed)
Spoke with pt who states she stated to have chest and Lt shoulder pain this morning at 0400. Pt also c/o SOB and nausea on and off. Pt did take 2 nitro with some relief. Encouraged pt to be seen in the ER. Pt voiced understanding and states that she has someone that can take her to the ER. Please advise.

## 2021-04-23 NOTE — H&P (Signed)
History and Physical    [name] TDS:[id number] DOB: 07-21-1966 DOA: 04/23/2021  PCP: Heather Roberts, NP   Patient coming from: Home  I have personally briefly reviewed patient's old medical records in [facility]  Chief Complaint: Chest Pain  HPI: [name] is a 55 y.o. female with medical history significant for  CABG x 3 2009, diastolic CHF, COPD, hypertension, stroke. Patient presented to the ED with complaints of chest pain that started about 4 AM this morning.  Chest pain was in the left side of her chest, sharp, radiated to her left shoulder with associated difficulty breathing and nausea without vomiting.  She reports she was watching TV when chest pains started. Chest pain improved but did not completely go away after she took 2 nitros.  She reports that she easily gets winded with activity and this resolves with rest.  ED Course: Temperature 98.1.  Heart rate 40s, respiratory rate 14 - 20.  Blood pressure systolic 130s to 262M.  O2 sats greater than 98% on room air.  Troponin 3 X 2.  Chest x-ray without acute abnormality.  EKG shows sinus bradycardia rate 50, without significant change. EDP talked to Dr. Wyline Mood, patient will be seen in the morning.  Aspirin 325 given.  Review of Systems: As per HPI all other systems reviewed and negative.  Past Medical History:  Diagnosis Date   ADD (attention deficit disorder with hyperactivity)    Anxiety    Bipolar 1 disorder (HCC)    Carotid artery disease (HCC)    Right 50-69% stenosis by doppler 2010   Chronic diastolic (congestive) heart failure (HCC)    COPD (chronic obstructive pulmonary disease) (HCC)    Phreesia 12/15/2020   Coronary artery disease    a. s/p CABG in 2009 with LIMA-LAD, SVG-RI, and SVG-RCA b. low-risk NST in 10/2015 c. low-risk NST in 12/2018.   CVA (cerebral infarction) 12/2008   Reports a neurologist told her she had a blood clot in her brain   Depression    Depression    Phreesia  12/15/2020   Emphysema of lung (HCC) 06/19/2018   Fibromyalgia    GERD (gastroesophageal reflux disease)    Hyperlipidemia    Hypertension    Myocardial infarction (HCC) 09/18/2011   Phreesia 12/15/2020   OSA on CPAP 12/31/2018   Sleep apnea    Phreesia 12/15/2020   Stroke (HCC)    Phreesia 12/15/2020   Tobacco abuse     Past Surgical History:  Procedure Laterality Date   CARDIAC CATHETERIZATION     CORONARY ARTERY BYPASS GRAFT  2009   3V CABG LIMA --> LAD, SVG --> Ramus Intermediate,  SVG --> RCA   TUBAL LIGATION  1990     reports that she quit smoking about 2 years ago. Her smoking use included cigarettes. She started smoking about 39 years ago. She has a 17.50 pack-year smoking history. She has never used smokeless tobacco. She reports previous alcohol use. She reports that she does not use drugs.  Allergies  Allergen Reactions   Other    Imdur [Isosorbide Nitrate] Other (See Comments)    Headaches; Worsening Chest Pain    Family History  Problem Relation Age of Onset   CAD Mother    Intellectual disability Maternal Aunt    Intellectual disability Maternal Uncle    Alcohol abuse Paternal Grandfather    Prior to Admission medications   Medication Sig Start Date End Date Taking? Authorizing Provider  Albuterol Sulfate (  PROAIR RESPICLICK) 108 (90 Base) MCG/ACT AEPB Inhale 2 puffs into the lungs every 6 (six) hours as needed. 03/31/21  Yes Heather Roberts, NP  ALPRAZolam Prudy Feeler) 0.5 MG tablet Take 1 tablet (0.5 mg total) by mouth 3 (three) times daily as needed. 01/05/21  Yes Mozingo, Thereasa Solo, NP  amLODipine (NORVASC) 10 MG tablet Take 1 tablet (10 mg total) by mouth daily. 03/19/21  Yes Heather Roberts, NP  aspirin EC 81 MG tablet Take 81 mg by mouth daily.   Yes [provider]  budesonide-formoterol (SYMBICORT) 160-4.5 MCG/ACT inhaler Inhale 2 puffs into the lungs 2 (two) times daily.   Yes [provider]  Cholecalciferol (VITAMIN D3 PO) Take 1  tablet by mouth daily.   Yes [provider]  cyclobenzaprine (FLEXERIL) 10 MG tablet Take 10 mg by mouth 2 (two) times daily as needed for muscle spasms.    Yes [provider]  diphenoxylate-atropine (LOMOTIL) 2.5-0.025 MG tablet Take 2.5 mg by mouth daily as needed.    Yes [provider]  esomeprazole (NEXIUM) 40 MG capsule Take 40 mg by mouth daily. 03/20/21  Yes [provider]  FLUoxetine (PROZAC) 40 MG capsule TAKE TWO CAPSULES BY MOUTH ONCE DAILY Patient taking differently: Take 40 mg by mouth 2 (two) times daily. 03/19/21  Yes Heather Roberts, NP  furosemide (LASIX) 40 MG tablet Take 1 tablet (40 mg total) by mouth daily. 01/02/19  Yes Erick Blinks, MD  ibuprofen (ADVIL) 800 MG tablet Take 800 mg by mouth every 8 (eight) hours as needed. 01/12/20  Yes [provider]  Multiple Vitamins-Minerals (CENTRUM SILVER PO) Take 1 tablet by mouth daily.   Yes [provider]  nitroGLYCERIN (NITROSTAT) 0.4 MG SL tablet Place 1 tablet (0.4 mg total) under the tongue every 5 (five) minutes x 3 doses as needed for chest pain (if no relief after 3rd dose, proceed to the ED for an evaluation). 11/21/17  Yes Laqueta Linden, MD  ondansetron (ZOFRAN) 4 MG tablet Take 4 mg by mouth every 8 (eight) hours as needed. 11/01/19  Yes [provider]  potassium chloride (KLOR-CON) 20 MEQ packet Take 20 mEq by mouth 2 (two) times daily. 12/06/18  Yes Laqueta Linden, MD  ranolazine (RANEXA) 1000 MG SR tablet Take 1 tablet (1,000 mg total) by mouth 2 (two) times daily. 10/08/19  Yes Laqueta Linden, MD  rOPINIRole (REQUIP) 1 MG tablet Take 1 tablet (1 mg total) by mouth at bedtime as needed (for restless leg). 03/24/21  Yes Heather Roberts, NP  rosuvastatin (CRESTOR) 5 MG tablet Take 1 tablet (5 mg total) by mouth daily. 03/23/16  Yes Laqueta Linden, MD  vitamin B-12 (CYANOCOBALAMIN) 100 MCG tablet Take 100 mcg by mouth daily.   Yes [provider]  zolpidem (AMBIEN) 10 MG tablet TAKE 1 TABLET BY MOUTH EVERY EVENING AT BEDTIME 03/19/21  Yes Heather Roberts, NP  gabapentin (NEURONTIN) 300 MG capsule Take 1 capsule by mouth 4 (four) times daily. Patient not taking: Reported on 04/23/2021 11/20/18   [provider]  pantoprazole (PROTONIX) 40 MG tablet Take 1 tablet (40 mg total) by mouth daily. Patient not taking: No sig reported 01/15/21   Heather Roberts, NP    Physical Exam: Vitals:   04/23/21 1800 04/23/21 1830 04/23/21 1930 04/23/21 2000  BP: (!) 143/64 (!) 148/69 (!) 146/67 136/65  Pulse: (!) 42 (!) 45 (!) 52 (!) 43  Resp: 14 16 18  17  Temp:      TempSrc:      SpO2: 97% 98% 99% 98%  Weight:      Height:        Constitutional: NAD, calm, comfortable Vitals:   04/23/21 1800 04/23/21 1830 04/23/21 1930 04/23/21 2000  BP: (!) 143/64 (!) 148/69 (!) 146/67 136/65  Pulse: (!) 42 (!) 45 (!) 52 (!) 43  Resp: 14 16 18 17   Temp:      TempSrc:      SpO2: 97% 98% 99% 98%  Weight:      Height:       Eyes: PERRL, lids and conjunctivae normal ENMT: Mucous membranes are moist.  Neck: normal, supple, no masses, no thyromegaly Respiratory: clear to auscultation bilaterally, no wheezing, no crackles. Normal respiratory effort. No accessory muscle use.  Cardiovascular: Regular rate and rhythm, no murmurs / rubs / gallops. No extremity edema. 2+ pedal pulses.   Abdomen: no tenderness, no masses palpated. No hepatosplenomegaly. Bowel sounds positive.  Musculoskeletal: no clubbing / cyanosis. No joint deformity upper and lower extremities. Good ROM, no contractures. Normal muscle tone.  Skin: no rashes, lesions, ulcers. No induration Neurologic: No apparent cranial nerve abnormality, moving extremities spontaneously. Psychiatric: Normal judgment and insight. Alert and oriented x 3. Normal mood.   Labs on Admission: I have personally reviewed following labs and imaging studies  CBC: Recent Labs  Lab 04/23/21 1523   WBC 7.0  HGB 11.6*  HCT 36.8  MCV 90.2  PLT 377   Basic Metabolic Panel: Recent Labs  Lab 04/23/21 1523  NA 138  K 3.7  CL 102  CO2 26  GLUCOSE 97  BUN 11  CREATININE 0.62  CALCIUM 9.5    Radiological Exams on Admission: DG Chest 2 View  Result Date: 04/23/2021 CLINICAL DATA:  Chest pain, sob, nausea since 4 am, COPD Pos covid 12/01/20 chest pain EXAM: CHEST - 2 VIEW COMPARISON:  None. FINDINGS: Sternotomy wires overlie normal cardiac silhouette. Normal pulmonary vasculature. No effusion, infiltrate, or pneumothorax. No acute osseous abnormality. IMPRESSION: No active cardiopulmonary disease. Electronically Signed   By: 01/29/21 M.D.   On: 04/23/2021 15:14    EKG: Independently reviewed.  Sinus bradycardia rate 50, QTc 455.  No significant change from prior.  Assessment/Plan Principal Problem:   Chest pain Active Problems:   RESTLESS LEG SYNDROME   Essential hypertension   Chronic diastolic CHF (congestive heart failure) (HCC)   S/P CABG x 3   Bipolar 1 disorder (HCC)   Chest pain with CABG X 3 Hx-atypical chest pain, some improvement with 2 nitro.  Troponins, unremarkable.  EKG showing sinus bradycardia rates 50s-about her baseline.  Follows with Dr. 04/25/2021.  Prior CABG in 2009 with LIMA-LAD, SVG-RI, SVG-RCA -N.p.o. midnight - aspirin 325 mg given, continue 81 mg daily -Resume statin, ranolazine - EDP Dr. 2010, patient will be seen in the morning - ECHO - EKG a.m  HTN-stable -Resume Norvasc  Diastolic CHF-stable and compensated, last echo 2020 EF 60 to 65%.  -Resume Lasix  Restless leg syndrome-  -Resume ropinirole  Bipolar disorder -Resume Prozac Xanax as needed,  DVT prophylaxis: [place] Code Status:  Full Family Communication: None at bedside Disposition Plan: ~ 1- 2 days Consults called:  Cards Admission status:  Obs, Tele   Wyline Mood MD [facility]  04/23/2021, 10:21 PM

## 2021-04-23 NOTE — ED Provider Notes (Signed)
[facility] EMERGENCY DEPARTMENT Provider Note   CSN: [id number] Arrival date & time: 04/23/21  1424     History Chief Complaint  Patient presents with   Chest Pain    [name] is a 55 y.o. female.  HPI  Patient with significant medical history of CAD CABG 2009, CHF with preserved ejection fraction, COPD presents to the emergency department with chief complaint of left-sided chest pain.  Patient states this started around 4:00 this morning, states it came on suddenly, she was watching TV and then started to have this chest pain, she states it radiates into her left neck/arm, she became slightly more short of breath but this since resolved, she states she felt warm and sweaty but thinks this is from menopause.  She states that she continues to have left-sided chest pain, worsen when she moves her arm, states she chronically feels short of breath on exertion does not feel any worse than usual, she denies worsening leg swelling, states that laying back tends to make the pain worse.  She states this feels different from her previous heart attacks, she has history of GERD but does not think it is this.  She denies alleviating factors.  She denies fevers, chills, abdominal pain, nausea vomiting.  Past Medical History:  Diagnosis Date   ADD (attention deficit disorder with hyperactivity)    Anxiety    Bipolar 1 disorder (HCC)    Carotid artery disease (HCC)    Right 50-69% stenosis by doppler 2010   Chronic diastolic (congestive) heart failure (HCC)    COPD (chronic obstructive pulmonary disease) (HCC)    Phreesia 12/15/2020   Coronary artery disease    a. s/p CABG in 2009 with LIMA-LAD, SVG-RI, and SVG-RCA b. low-risk NST in 10/2015 c. low-risk NST in 12/2018.   CVA (cerebral infarction) 12/2008   Reports a neurologist told her she had a blood clot in her brain   Depression    Depression    Phreesia 12/15/2020   Emphysema of lung (HCC) 06/19/2018   Fibromyalgia    GERD  (gastroesophageal reflux disease)    Hyperlipidemia    Hypertension    Myocardial infarction (HCC) 09/18/2011   Phreesia 12/15/2020   OSA on CPAP 12/31/2018   Sleep apnea    Phreesia 12/15/2020   Stroke (HCC)    Phreesia 12/15/2020   Tobacco abuse     Patient Active Problem List   Diagnosis Date Noted   Cerumen impaction 03/31/2021   Sinusitis 03/31/2021   Bipolar 1 disorder (HCC) 12/18/2020   Insomnia 12/18/2020   Anxiety 12/31/2018   OSA on CPAP 12/31/2018   Chronic diastolic CHF (congestive heart failure) (HCC) 12/31/2018   PTSD (post-traumatic stress disorder) 07/21/2018   Emphysema of lung (HCC) 06/19/2018   GAD (generalized anxiety disorder) 06/19/2018   Panic disorder 06/19/2018   Colon cancer screening 04/24/2018   Laryngopharyngeal reflux (LPR) 01/26/2018   S/P CABG x 3 05/13/2014   CAD (coronary artery disease) 03/01/2012   Tobacco abuse 09/18/2011   Stroke (HCC) 10/07/2009   Mood disorder in conditions classified elsewhere 09/23/2009   RESTLESS LEG SYNDROME 09/23/2009   Essential hypertension 09/23/2009   GERD 09/23/2009   Fibromyalgia 09/23/2009    Past Surgical History:  Procedure Laterality Date   CARDIAC CATHETERIZATION     CORONARY ARTERY BYPASS GRAFT  2009   3V CABG LIMA --> LAD, SVG --> Ramus Intermediate,  SVG --> RCA   TUBAL LIGATION  1990     OB History  Gravida  2   Para  2   Term  2   Preterm      AB      Living  2      SAB      IAB      Ectopic      Multiple      Live Births              Family History  Problem Relation Age of Onset   CAD Mother    Intellectual disability Maternal Aunt    Intellectual disability Maternal Uncle    Alcohol abuse Paternal Grandfather     Social History   Tobacco Use   Smoking status: Former    Packs/day: 0.50    Years: 35.00    Pack years: 17.50    Types: Cigarettes    Start date: 12/14/1981    Quit date: 12/17/2018    Years since quitting: 2.3   Smokeless tobacco:  Never  Vaping Use   Vaping Use: Every day   Substances: Nicotine, Flavoring  Substance Use Topics   Alcohol use: Not Currently    Comment: rarely- had a margarita on birthday   Drug use: No    Home Medications Prior to Admission medications   Medication Sig Start Date End Date Taking? Authorizing Provider  Albuterol Sulfate (PROAIR RESPICLICK) 108 (90 Base) MCG/ACT AEPB Inhale 2 puffs into the lungs every 6 (six) hours as needed. 03/31/21  Yes Heather Roberts, NP  ALPRAZolam Prudy Feeler) 0.5 MG tablet Take 1 tablet (0.5 mg total) by mouth 3 (three) times daily as needed. 01/05/21  Yes Mozingo, Thereasa Solo, NP  amLODipine (NORVASC) 10 MG tablet Take 1 tablet (10 mg total) by mouth daily. 03/19/21  Yes Heather Roberts, NP  aspirin EC 81 MG tablet Take 81 mg by mouth daily.   Yes [provider]  budesonide-formoterol (SYMBICORT) 160-4.5 MCG/ACT inhaler Inhale 2 puffs into the lungs 2 (two) times daily.   Yes [provider]  Cholecalciferol (VITAMIN D3 PO) Take 1 tablet by mouth daily.   Yes [provider]  cyclobenzaprine (FLEXERIL) 10 MG tablet Take 10 mg by mouth 2 (two) times daily as needed for muscle spasms.    Yes [provider]  diphenoxylate-atropine (LOMOTIL) 2.5-0.025 MG tablet Take 2.5 mg by mouth daily as needed.    Yes [provider]  esomeprazole (NEXIUM) 40 MG capsule Take 40 mg by mouth daily. 03/20/21  Yes [provider]  FLUoxetine (PROZAC) 40 MG capsule TAKE TWO CAPSULES BY MOUTH ONCE DAILY Patient taking differently: Take 40 mg by mouth 2 (two) times daily. 03/19/21  Yes Heather Roberts, NP  furosemide (LASIX) 40 MG tablet Take 1 tablet (40 mg total) by mouth daily. 01/02/19  Yes Erick Blinks, MD  ibuprofen (ADVIL) 800 MG tablet Take 800 mg by mouth every 8 (eight) hours as needed. 01/12/20  Yes [provider]  Multiple Vitamins-Minerals (CENTRUM SILVER PO) Take 1 tablet by mouth daily.   Yes [provider]  nitroGLYCERIN (NITROSTAT) 0.4 MG SL tablet Place 1 tablet (0.4 mg total) under the tongue every 5 (five) minutes x 3 doses as needed for chest pain (if no relief after 3rd dose, proceed to the ED for an evaluation). 11/21/17  Yes Laqueta Linden, MD  ondansetron (ZOFRAN) 4 MG tablet Take 4 mg by mouth every 8 (eight) hours as needed. 11/01/19  Yes [provider]  potassium chloride (KLOR-CON) 20 MEQ  packet Take 20 mEq by mouth 2 (two) times daily. 12/06/18  Yes Laqueta LindenKoneswaran, Suresh A, MD  ranolazine (RANEXA) 1000 MG SR tablet Take 1 tablet (1,000 mg total) by mouth 2 (two) times daily. 10/08/19  Yes Laqueta LindenKoneswaran, Suresh A, MD  rOPINIRole (REQUIP) 1 MG tablet Take 1 tablet (1 mg total) by mouth at bedtime as needed (for restless leg). 03/24/21  Yes Heather RobertsGray, Joseph M, NP  rosuvastatin (CRESTOR) 5 MG tablet Take 1 tablet (5 mg total) by mouth daily. 03/23/16  Yes Laqueta LindenKoneswaran, Suresh A, MD  vitamin B-12 (CYANOCOBALAMIN) 100 MCG tablet Take 100 mcg by mouth daily.   Yes [provider]  zolpidem (AMBIEN) 10 MG tablet TAKE 1 TABLET BY MOUTH EVERY EVENING AT BEDTIME 03/19/21  Yes Heather RobertsGray, Joseph M, NP  gabapentin (NEURONTIN) 300 MG capsule Take 1 capsule by mouth 4 (four) times daily. Patient not taking: Reported on 04/23/2021 11/20/18   [provider]  pantoprazole (PROTONIX) 40 MG tablet Take 1 tablet (40 mg total) by mouth daily. Patient not taking: No sig reported 01/15/21   Heather RobertsGray, Joseph M, NP    Allergies    Other and Imdur [isosorbide nitrate]  Review of Systems   Review of Systems  Constitutional:  Negative for chills and fever.  HENT:  Negative for congestion.   Respiratory:  Negative for shortness of breath.   Cardiovascular:  Positive for chest pain.  Gastrointestinal:  Negative for abdominal pain, diarrhea and vomiting.  Genitourinary:  Negative for enuresis.  Musculoskeletal:  Negative for back pain.  Skin:  Negative for rash.  Neurological:  Negative for headaches.   Hematological:  Does not bruise/bleed easily.   Physical Exam Updated Vital Signs BP (!) 148/69   Pulse (!) 45   Temp 98.1 F (36.7 C) (Oral)   Resp 16   Ht 5\' 2"  (1.575 m)   Wt 65.8 kg   LMP 10/22/2015   SpO2 98%   BMI 26.52 kg/m   Physical Exam Vitals and nursing note reviewed.  Constitutional:      General: She is not in acute distress.    Appearance: She is not ill-appearing.  HENT:     Head: Normocephalic and atraumatic.     Nose: No congestion.  Eyes:     Conjunctiva/sclera: Conjunctivae normal.  Cardiovascular:     Rate and Rhythm: Regular rhythm. Bradycardia present.     Pulses: Normal pulses.     Heart sounds: No murmur heard.   No friction rub. No gallop.     Comments: Chest was palpated could reproduce chest pain.  Pulmonary:     Effort: No respiratory distress.     Breath sounds: No wheezing, rhonchi or rales.  Abdominal:     Palpations: Abdomen is soft.     Tenderness: There is no abdominal tenderness.  Musculoskeletal:     Right lower leg: No edema.     Left lower leg: No edema.  Skin:    General: Skin is warm and dry.  Neurological:     Mental Status: She is alert.  Psychiatric:        Mood and Affect: Mood normal.    ED Results / Procedures / Treatments   Labs (all labs ordered are listed, but only abnormal results are displayed) Labs Reviewed  CBC - Abnormal; Notable for the following components:      Result Value   Hemoglobin 11.6 (*)    All other components within normal limits  RESP PANEL BY RT-PCR (FLU A&B,  COVID) ARPGX2  BASIC METABOLIC PANEL  POC URINE PREG, ED  TROPONIN I (HIGH SENSITIVITY)  TROPONIN I (HIGH SENSITIVITY)    EKG EKG Interpretation  Date/Time:  Thursday April 23 2021 14:35:17 EDT Ventricular Rate:  50 PR Interval:  126 QRS Duration: 96 QT Interval:  500 QTC Calculation: 455 R Axis:   85 Text Interpretation: Sinus bradycardia Cannot rule out Anterior infarct , age undetermined Abnormal ECG Confirmed by  Bethann Berkshire [phone]) on 04/23/2021 3:50:52 PM  Radiology DG Chest 2 View  Result Date: 04/23/2021 CLINICAL DATA:  Chest pain, sob, nausea since 4 am, COPD Pos covid 12/01/20 chest pain EXAM: CHEST - 2 VIEW COMPARISON:  None. FINDINGS: Sternotomy wires overlie normal cardiac silhouette. Normal pulmonary vasculature. No effusion, infiltrate, or pneumothorax. No acute osseous abnormality. IMPRESSION: No active cardiopulmonary disease. Electronically Signed   By: Genevive Bi M.D.   On: 04/23/2021 15:14    Procedures Procedures   Medications Ordered in ED Medications  cyclobenzaprine (FLEXERIL) tablet 10 mg (10 mg Oral Given 04/23/21 1551)  aspirin tablet 325 mg (325 mg Oral Given 04/23/21 1746)    ED Course  I have reviewed the triage vital signs and the nursing notes.  Pertinent labs & imaging results that were available during my care of the patient were reviewed by me and considered in my medical decision making (see chart for details).    MDM Rules/Calculators/A&P                         Initial impression-patient presents with chest pain she is alert, does not appear in acute stress, vital signs reassuring.  Will obtain chest pain work-up and reassess.  Work-up-CBC shows normocytic anemia with hemoglobin 0.6, BMP unremarkable, first troponin is 3, second trop is 3, chest x-ray unremarkable, EKG sinus without signs of ischemia.  Reassessment-patient was assessed, continues have pain despite muscle relaxers.  Initial troponins are unremarkable, without EKG changes, patient does have concerning history will consult cardiology for further recommendations.  Updated patient on recommendations from cardiology, she is in agreement this plan.  will consult with hospitalist for admission.  Consult-Spoke with Dr. Milta Deiters of cardiology, he recommend trending troponins, if unremarkable but patient continues to have chest pain recommends admission for observation and his team will be  available for consultation.  Spoke with Dr. Mariea Clonts who will admit the patient.   Rule out- I have low suspicion for ACS as history is atypical, patient has no cardiac history, EKG was sinus rhythm without signs of ischemia, patient had a delta troponin.  Low suspicion for PE as patient denies pleuritic chest pain, patient denies leg pain, no pedal edema noted on exam, vital signs reassuring.  Low suspicion for AAA or aortic dissection as history is atypical, patient has low risk factors.  Low suspicion for heart block as there is no noted block seen on EKG, patient history of bradycardia.  Low suspicion for systemic infection as patient is nontoxic-appearing, vital signs reassuring, no obvious source infection noted on exam.   Plan-admit to medicine for cardiac rule out.  Cardiology eval for consultation tomorrow.  Final Clinical Impression(s) / ED Diagnoses Final diagnoses:  Nonspecific chest pain    Rx / DC Orders ED Discharge Orders     None        Barnie Del 04/23/21 1913    Bethann Berkshire, MD 04/24/21 1036

## 2021-04-23 NOTE — Telephone Encounter (Signed)
Agree with ER evaluation   J Monica Mccammon MD 

## 2021-04-23 NOTE — ED Triage Notes (Signed)
C/o chest pain onset this 4 am with nausea and shortness of breath

## 2021-04-24 ENCOUNTER — Other Ambulatory Visit: Payer: Self-pay

## 2021-04-24 ENCOUNTER — Inpatient Hospital Stay (HOSPITAL_COMMUNITY): Payer: BC Managed Care – PPO

## 2021-04-24 ENCOUNTER — Encounter (HOSPITAL_COMMUNITY)
Admission: EM | Disposition: A | Discharge status: Home / Self Care | Payer: Self-pay | Source: Home / Self Care | Attending: Emergency Medicine

## 2021-04-24 ENCOUNTER — Encounter (HOSPITAL_COMMUNITY): Payer: Self-pay | Admitting: Cardiology

## 2021-04-24 DIAGNOSIS — I10 Essential (primary) hypertension: Secondary | ICD-10-CM | POA: Diagnosis not present

## 2021-04-24 DIAGNOSIS — I200 Unstable angina: Secondary | ICD-10-CM | POA: Diagnosis not present

## 2021-04-24 DIAGNOSIS — I25810 Atherosclerosis of coronary artery bypass graft(s) without angina pectoris: Secondary | ICD-10-CM

## 2021-04-24 DIAGNOSIS — R079 Chest pain, unspecified: Secondary | ICD-10-CM | POA: Diagnosis not present

## 2021-04-24 DIAGNOSIS — R072 Precordial pain: Secondary | ICD-10-CM

## 2021-04-24 DIAGNOSIS — F319 Bipolar disorder, unspecified: Secondary | ICD-10-CM

## 2021-04-24 DIAGNOSIS — I2510 Atherosclerotic heart disease of native coronary artery without angina pectoris: Secondary | ICD-10-CM

## 2021-04-24 DIAGNOSIS — E785 Hyperlipidemia, unspecified: Secondary | ICD-10-CM | POA: Diagnosis not present

## 2021-04-24 DIAGNOSIS — I5032 Chronic diastolic (congestive) heart failure: Secondary | ICD-10-CM | POA: Diagnosis not present

## 2021-04-24 HISTORY — PX: LEFT HEART CATH AND CORS/GRAFTS ANGIOGRAPHY: CATH118250

## 2021-04-24 LAB — BASIC METABOLIC PANEL
Anion gap: 12 (ref 5–15)
BUN: 13 mg/dL (ref 6–20)
CO2: 24 mmol/L (ref 22–32)
Calcium: 9.5 mg/dL (ref 8.9–10.3)
Chloride: 102 mmol/L (ref 98–111)
Creatinine, Ser: 0.7 mg/dL (ref 0.44–1.00)
GFR, Estimated: >60 mL/min (ref >60)
Glucose, Bld: 97 mg/dL (ref 70–99)
Potassium: 4.6 mmol/L (ref 3.5–5.1)
Sodium: 138 mmol/L (ref 135–145)

## 2021-04-24 LAB — ECHOCARDIOGRAM COMPLETE
AR max vel: 2.54 cm2
AV Area VTI: 2.26 cm2
AV Area mean vel: 2.56 cm2
AV Mean grad: 4 mmHg
AV Peak grad: 9.7 mmHg
Ao pk vel: 1.56 m/s
Area-P 1/2: 2.73 cm2
Height: 62 in
MV VTI: 2.01 cm2
S' Lateral: 2.77 cm
Weight: 2320 oz

## 2021-04-24 LAB — CBC
HCT: 37.5 % (ref 36.0–46.0)
Hemoglobin: 11.5 g/dL — ABNORMAL LOW (ref 12.0–15.0)
MCH: 28.5 pg (ref 26.0–34.0)
MCHC: 30.7 g/dL (ref 30.0–36.0)
MCV: 93.1 fL (ref 80.0–100.0)
Platelets: 363 10*3/uL (ref 150–400)
RBC: 4.03 MIL/uL (ref 3.87–5.11)
RDW: 14 % (ref 11.5–15.5)
WBC: 6.2 10*3/uL (ref 4.0–10.5)
nRBC: 0 % (ref 0.0–0.2)

## 2021-04-24 LAB — HIV ANTIBODY (ROUTINE TESTING W REFLEX): HIV Screen 4th Generation wRfx: NONREACTIVE

## 2021-04-24 SURGERY — LEFT HEART CATH AND CORS/GRAFTS ANGIOGRAPHY
Anesthesia: LOCAL

## 2021-04-24 MED ORDER — SODIUM CHLORIDE 0.9% FLUSH: 3.0000 mL | INTRAVENOUS | Status: DC | PRN

## 2021-04-24 MED ORDER — SODIUM CHLORIDE 0.9 % IV SOLN: 250.0000 mL | INTRAVENOUS | Status: DC | PRN

## 2021-04-24 MED ORDER — VERAPAMIL HCL 2.5 MG/ML IV SOLN
INTRAVENOUS | Status: DC | PRN
  Administered 2021-04-24: 10 mL via INTRA_ARTERIAL

## 2021-04-24 MED ORDER — HEPARIN SODIUM (PORCINE) 1000 UNIT/ML IJ SOLN
INTRAMUSCULAR | Status: DC | PRN
  Administered 2021-04-24: 3500 [IU] via INTRAVENOUS

## 2021-04-24 MED ORDER — MIDAZOLAM HCL 2 MG/2ML IJ SOLN
INTRAMUSCULAR | Status: AC
  Filled 2021-04-24: qty 2

## 2021-04-24 MED ORDER — HEPARIN (PORCINE) IN NACL 1000-0.9 UT/500ML-% IV SOLN
INTRAVENOUS | Status: AC
  Filled 2021-04-24: qty 500

## 2021-04-24 MED ORDER — SODIUM CHLORIDE 0.9 % WEIGHT BASED INFUSION
3.0000 mL/kg/h | INTRAVENOUS | Status: AC
  Administered 2021-04-24: 3 mL/kg/h via INTRAVENOUS

## 2021-04-24 MED ORDER — LABETALOL HCL 5 MG/ML IV SOLN: 10.0000 mg | INTRAVENOUS | Status: AC | PRN

## 2021-04-24 MED ORDER — SODIUM CHLORIDE 0.9 % WEIGHT BASED INFUSION
1.0000 mL/kg/h | INTRAVENOUS | Status: DC
  Administered 2021-04-24 (×2): 1 mL/kg/h via INTRAVENOUS

## 2021-04-24 MED ORDER — HYDRALAZINE HCL 20 MG/ML IJ SOLN: 10.0000 mg | INTRAMUSCULAR | Status: AC | PRN

## 2021-04-24 MED ORDER — LIDOCAINE HCL (PF) 1 % IJ SOLN
INTRAMUSCULAR | Status: DC | PRN
  Administered 2021-04-24: 2 mL

## 2021-04-24 MED ORDER — IOHEXOL 350 MG/ML SOLN
INTRAVENOUS | Status: DC | PRN
  Administered 2021-04-24: 75 mL

## 2021-04-24 MED ORDER — FENTANYL CITRATE (PF) 100 MCG/2ML IJ SOLN
INTRAMUSCULAR | Status: AC
  Filled 2021-04-24: qty 2

## 2021-04-24 MED ORDER — SODIUM CHLORIDE 0.9 % IV SOLN: INTRAVENOUS | Status: AC

## 2021-04-24 MED ORDER — FENTANYL CITRATE (PF) 100 MCG/2ML IJ SOLN
INTRAMUSCULAR | Status: DC | PRN
  Administered 2021-04-24: 50 ug via INTRAVENOUS

## 2021-04-24 MED ORDER — ROSUVASTATIN CALCIUM 20 MG PO TABS
20.0000 mg | ORAL_TABLET | Freq: Every day | ORAL | Status: DC
  Administered 2021-04-24: 20 mg via ORAL
  Filled 2021-04-24: qty 1

## 2021-04-24 MED ORDER — SODIUM CHLORIDE 0.9% FLUSH
3.0000 mL | Freq: Two times a day (BID) | INTRAVENOUS | Status: DC
  Administered 2021-04-24: 3 mL via INTRAVENOUS

## 2021-04-24 MED ORDER — HEPARIN SODIUM (PORCINE) 1000 UNIT/ML IJ SOLN
INTRAMUSCULAR | Status: AC
  Filled 2021-04-24: qty 1

## 2021-04-24 MED ORDER — MIDAZOLAM HCL 2 MG/2ML IJ SOLN
INTRAMUSCULAR | Status: DC | PRN
  Administered 2021-04-24: 2 mg via INTRAVENOUS

## 2021-04-24 MED ORDER — VERAPAMIL HCL 2.5 MG/ML IV SOLN
INTRAVENOUS | Status: AC
  Filled 2021-04-24: qty 2

## 2021-04-24 MED ORDER — HEPARIN (PORCINE) IN NACL 1000-0.9 UT/500ML-% IV SOLN
INTRAVENOUS | Status: DC | PRN
  Administered 2021-04-24 (×2): 500 mL

## 2021-04-24 SURGICAL SUPPLY — 9 items
CATH INFINITI 5FR MULTPACK ANG (CATHETERS) ×1 IMPLANT
DEVICE RAD TR BAND REGULAR (VASCULAR PRODUCTS) ×1 IMPLANT
GLIDESHEATH SLEND SS 6F .021 (SHEATH) ×1 IMPLANT
KIT HEART LEFT (KITS) ×2 IMPLANT
PACK CARDIAC CATHETERIZATION (CUSTOM PROCEDURE TRAY) ×2 IMPLANT
TRANSDUCER W/STOPCOCK (MISCELLANEOUS) ×2 IMPLANT
TUBING CIL FLEX 10 FLL-RA (TUBING) ×3 IMPLANT
WIRE EMERALD 3MM-J .035X150CM (WIRE) ×1 IMPLANT
WIRE HI TORQ VERSACORE J 260CM (WIRE) ×1 IMPLANT

## 2021-04-24 NOTE — Consult Note (Addendum)
Cardiology Consultation:   Patient ID: [name] MRN: [id number]; DOB: 06/02/1966  Admit date: 04/23/2021 Date of Consult: 04/24/2021  PCP:  Gray, Joseph M, NP   [facility] Providers Cardiologist:  Branch, Jonathan, MD        Patient Profile:   [name] is a 55 y.o. female with past medical history of CAD (s/p CABG in 2009 with LIMA-LAD, SVG-RI and SVG-RCA with low-risk NST in 12/2018), HFpEF, HTN, HLD, COPD and OSA (on CPAP) who is being seen 04/24/2021 for the evaluation of chest pain at the request of Dr. Tat.  History of Present Illness:   Ms. Padget was last examined by Dr. Branch in 04/2020 and reported having worsening dyspnea on exertion with associated wheezing at times and chest fullness but denied any exertional chest pain.  It was recommended that she have a repeat echocardiogram and pending results may consider a R/LHC for definitive evaluation. It appears that her echocardiogram was ordered but never performed and she has not been evaluated by Cardiology since.  She called the office yesterday morning reporting chest discomfort which radiated to her shoulder since 0400 with associated dyspnea and nausea. She was advised to go to the ED for further evaluation.   In talking with the patient today, she reports having episodes of chest pressure and worsening dyspnea over the past 2-3 weeks. Could occur at rest or with activity but episodes typically lasted for less than 10 minutes then resolved. Yesterday morning, she developed pressure along her left pectoral region which radiated into her left shoulder and down her left arm. Reports associated nausea and diaphoresis. She took 2 SL NTG at home with some improvement in symptoms but her pain persisted which prompted her to come to the ED. Her pain lasted throughout most of the day and resolved yesterday evening. She denies any recurrent pain this morning.  Initial labs showed WBC 7.0, Hgb 11.6, platelets 377, Na+  138, K+ 3.7 and creatinine 0.62. Initial Hs Troponin 3 with repeat of 3. COVID negative. CXR with no active cardiopulmonary disease. EKG shows sinus bradycardia, HR 50 with no acute ST changes when compared to prior tracings.   Past Medical History:  Diagnosis Date   ADD (attention deficit disorder with hyperactivity)    Anxiety    Bipolar 1 disorder (HCC)    Carotid artery disease (HCC)    Right 50-69% stenosis by doppler 2010   Chronic diastolic (congestive) heart failure (HCC)    COPD (chronic obstructive pulmonary disease) (HCC)    [employer]reesia 12/15/2020   Coronary artery disease    a. s/p CABG in 2009 with LIMA-LAD, SVG-RI, and SVG-RCA b. low-risk NST in 10/2015 c. low-risk NST in 12/2018.   CVA (cerebral infarction) 12/2008   Reports a neurologist told her she had a blood clot in her brain   Depression    Depression    [employer]reesia 12/15/2020   Emphysema of lung (HCC) 06/19/2018   Fibromyalgia    GERD (gastroesophageal reflux disease)    Hyperlipidemia    Hypertension    Myocardial infarction (HCC) 09/18/2011   [employer]reesia 12/15/2020   OSA on CPAP 12/31/2018   Sleep apnea    [employer]reesia 12/15/2020   Stroke (HCC)    [employer]reesia 12/15/2020   Tobacco abuse     Past Surgical History:  Procedure Laterality Date   CARDIAC CATHETERIZATION     CORONARY ARTERY BYPASS GRAFT  2009   3V CABG LIMA --> LAD, SVG --> Ramus Intermediate,  SVG -->   RCA   TUBAL LIGATION  1990     Home Medications:  Prior to Admission medications   Medication Sig Start Date End Date Taking? Authorizing Provider  Albuterol Sulfate (PROAIR RESPICLICK) 108 (90 Base) MCG/ACT AEPB Inhale 2 puffs into the lungs every 6 (six) hours as needed. 03/31/21  Yes Heather Roberts, NP  ALPRAZolam Prudy Feeler) 0.5 MG tablet Take 1 tablet (0.5 mg total) by mouth 3 (three) times daily as needed. 01/05/21  Yes Mozingo, Thereasa Solo, NP  amLODipine (NORVASC) 10 MG tablet Take 1 tablet (10 mg total) by mouth daily. 03/19/21  Yes Heather Roberts, NP   aspirin EC 81 MG tablet Take 81 mg by mouth daily.   Yes [provider]  budesonide-formoterol (SYMBICORT) 160-4.5 MCG/ACT inhaler Inhale 2 puffs into the lungs 2 (two) times daily.   Yes [provider]  Cholecalciferol (VITAMIN D3 PO) Take 1 tablet by mouth daily.   Yes [provider]  cyclobenzaprine (FLEXERIL) 10 MG tablet Take 10 mg by mouth 2 (two) times daily as needed for muscle spasms.    Yes [provider]  diphenoxylate-atropine (LOMOTIL) 2.5-0.025 MG tablet Take 2.5 mg by mouth daily as needed.    Yes [provider]  esomeprazole (NEXIUM) 40 MG capsule Take 40 mg by mouth daily. 03/20/21  Yes [provider]  FLUoxetine (PROZAC) 40 MG capsule TAKE TWO CAPSULES BY MOUTH ONCE DAILY Patient taking differently: Take 40 mg by mouth 2 (two) times daily. 03/19/21  Yes Heather Roberts, NP  furosemide (LASIX) 40 MG tablet Take 1 tablet (40 mg total) by mouth daily. 01/02/19  Yes Erick Blinks, MD  ibuprofen (ADVIL) 800 MG tablet Take 800 mg by mouth every 8 (eight) hours as needed. 01/12/20  Yes [provider]  Multiple Vitamins-Minerals (CENTRUM SILVER PO) Take 1 tablet by mouth daily.   Yes [provider]  nitroGLYCERIN (NITROSTAT) 0.4 MG SL tablet Place 1 tablet (0.4 mg total) under the tongue every 5 (five) minutes x 3 doses as needed for chest pain (if no relief after 3rd dose, proceed to the ED for an evaluation). 11/21/17  Yes Laqueta Linden, MD  ondansetron (ZOFRAN) 4 MG tablet Take 4 mg by mouth every 8 (eight) hours as needed. 11/01/19  Yes [provider]  potassium chloride (KLOR-CON) 20 MEQ packet Take 20 mEq by mouth 2 (two) times daily. 12/06/18  Yes Laqueta Linden, MD  ranolazine (RANEXA) 1000 MG SR tablet Take 1 tablet (1,000 mg total) by mouth 2 (two) times daily. 10/08/19  Yes Laqueta Linden, MD  rOPINIRole (REQUIP) 1 MG tablet Take 1 tablet (1 mg total) by mouth at bedtime as  needed (for restless leg). 03/24/21  Yes Heather Roberts, NP  rosuvastatin (CRESTOR) 5 MG tablet Take 1 tablet (5 mg total) by mouth daily. 03/23/16  Yes Laqueta Linden, MD  vitamin B-12 (CYANOCOBALAMIN) 100 MCG tablet Take 100 mcg by mouth daily.   Yes [provider]  zolpidem (AMBIEN) 10 MG tablet TAKE 1 TABLET BY MOUTH EVERY EVENING AT BEDTIME 03/19/21  Yes Heather Roberts, NP  gabapentin (NEURONTIN) 300 MG capsule Take 1 capsule by mouth 4 (four) times daily. Patient not taking: Reported on 04/23/2021 11/20/18   [provider]  pantoprazole (PROTONIX) 40 MG tablet Take 1 tablet (40 mg total) by mouth daily. Patient not taking: No sig reported 01/15/21   Heather Roberts, NP    Inpatient Medications: Scheduled Meds:  amLODipine  10 mg Oral Daily   aspirin EC  81 mg Oral Daily   enoxaparin (LOVENOX) injection  40 mg Subcutaneous Q24H   FLUoxetine  40 mg Oral BID   furosemide  40 mg Oral Daily   mometasone-formoterol  2 puff Inhalation BID   pantoprazole  40 mg Oral Daily   potassium chloride  20 mEq Oral BID   ranolazine  1,000 mg Oral BID   rosuvastatin  5 mg Oral Daily   Continuous Infusions:  PRN Meds: acetaminophen **OR** acetaminophen, ALPRAZolam, ondansetron **OR** ondansetron (ZOFRAN) IV, polyethylene glycol, rOPINIRole  Allergies:    Allergies  Allergen Reactions   Other    Imdur [Isosorbide Nitrate] Other (See Comments)    Headaches; Worsening Chest Pain    Social History:   Social History   Socioeconomic History   Marital status: Married    Spouse name: Not on file   Number of children: Not on file   Years of education: Not on file   Highest education level: Not on file  Occupational History   Occupation: [employer]    Comment: Investment banker, operational- in Set designer  Tobacco Use   Smoking status: Former    Packs/day: 0.50    Years: 35.00    Pack years: 17.50    Types: Cigarettes    Start date: 12/14/1981    Quit date: 12/17/2018     Years since quitting: 2.3   Smokeless tobacco: Never  Vaping Use   Vaping Use: Every day   Substances: Nicotine, Flavoring  Substance and Sexual Activity   Alcohol use: Not Currently    Comment: rarely- had a margarita on birthday   Drug use: No   Sexual activity: Yes    Birth control/protection: Post-menopausal  Other Topics Concern   Not on file  Social History Narrative   Married with 2 children who are grown; has pets   Social Determinants of Corporate investment banker Strain: Not on file  Food Insecurity: Not on file  Transportation Needs: Not on file  Physical Activity: Not on file  Stress: Not on file  Social Connections: Not on file  Intimate Partner Violence: Not on file    Family History:    Family History  Problem Relation Age of Onset   CAD Mother    Intellectual disability Maternal Aunt    Intellectual disability Maternal Uncle    Alcohol abuse Paternal Grandfather      ROS:  Please see the history of present illness.   All other ROS reviewed and negative.     Physical Exam/Data:   Vitals:   04/24/21 0700 04/24/21 0730 04/24/21 0800 04/24/21 0830  BP: 132/65 130/64 134/60 133/66  Pulse: (!) 46 (!) 51 (!) 47 (!) 49  Resp: 19 (!) 23 18 19   Temp:      TempSrc:      SpO2: 97% 97% 95% 99%  Weight:      Height:       No intake or output data in the 24 hours ending 04/24/21 0848 Last 3 Weights 04/23/2021 03/31/2021 12/18/2020  Weight (lbs) 145 lb 140 lb 140 lb  Weight (kg) 65.772 kg 63.504 kg 63.504 kg  Some encounter information is confidential and restricted. Go to Review Flowsheets activity to see all data.     Body mass index is 26.52 kg/m.  General:  Well nourished, well developed female appearing in no acute distress HEENT: normal Lymph: no adenopathy Neck: no JVD Endocrine:  No thryomegaly  Vascular: No carotid bruits; FA pulses 2+ bilaterally without bruits  Cardiac:  normal S1, S2; RRR; no murmur Lungs:  clear to auscultation  bilaterally, no wheezing, rhonchi or rales  Abd: soft, nontender, no hepatomegaly  Ext: no edema Musculoskeletal:  No deformities, BUE and BLE strength normal and equal Skin: warm and dry  Neuro:  CNs 2-12 intact, no focal abnormalities noted Psych:  Normal affect   EKG:  The EKG was personally reviewed and demonstrates: Sinus bradycardia, HR 50 with no acute ST changes when compared to prior tracings.   Telemetry:  Telemetry was personally reviewed and demonstrates: Sinus bradycardia, HR in 40's to 60's.   Relevant CV Studies:  NST: 12/2018 Blood pressure demonstrated a hypertensive response to exercise. Horizontal ST segment depression ST segment depression of 0.5 mm was noted during recovery in the II, III, aVF, V5 and V6 leads. The patient was asymptomatic. The study is normal. No perfusion defects consistent with myocardial ischemia or scar. This is a low risk study. Nuclear stress EF: 71%.  Laboratory Data:  High Sensitivity Troponin:   Recent Labs  Lab 04/23/21 1523 04/23/21 1651  TROPONINIHS 3 3     Chemistry Recent Labs  Lab 04/23/21 1523 04/24/21 0340  NA 138 138  K 3.7 4.6  CL 102 102  CO2 26 24  GLUCOSE 97 97  BUN 11 13  CREATININE 0.62 0.70  CALCIUM 9.5 9.5  GFRNONAA >60 >60  ANIONGAP 10 12    No results for input(s): PROT, ALBUMIN, AST, ALT, ALKPHOS, BILITOT in the last 168 hours. Hematology Recent Labs  Lab 04/23/21 1523 04/24/21 0340  WBC 7.0 6.2  RBC 4.08 4.03  HGB 11.6* 11.5*  HCT 36.8 37.5  MCV 90.2 93.1  MCH 28.4 28.5  MCHC 31.5 30.7  RDW 13.8 14.0  PLT 377 363   BNPNo results for input(s): BNP, PROBNP in the last 168 hours.  DDimer No results for input(s): DDIMER in the last 168 hours.   Radiology/Studies:  DG Chest 2 View  Result Date: 04/23/2021 CLINICAL DATA:  Chest pain, sob, nausea since 4 am, COPD Pos covid 12/01/20 chest pain EXAM: CHEST - 2 VIEW COMPARISON:  None. FINDINGS: Sternotomy wires overlie normal cardiac  silhouette. Normal pulmonary vasculature. No effusion, infiltrate, or pneumothorax. No acute osseous abnormality. IMPRESSION: No active cardiopulmonary disease. Electronically Signed   By: Genevive Bi M.D.   On: 04/23/2021 15:14     Assessment and Plan:   1. Chest Pain concerning for Accelerating Angina - She reports a 2-3 week history of intermittent chest pressure and dyspnea at rest and with activity but more persistent symptoms yesterday. - Hs Troponin values have been negative and her EKG is without acute ST changes. Echocardiogram is pending to assess for any structural abnormalities.  - Reviewed with Dr. Diona Browner and will plan for a cardiac catheterization today for definitive evaluation. The patient is in agreement as well.  - The patient understands that risks include but are not limited to stroke (1 in 1000), death (1 in 1000), kidney failure [usually temporary] (1 in 500), bleeding (1 in 200), allergic reaction [possibly serious] (1 in 200).   - Continue ASA, Ranexa and statin therapy. No BB due to baseline bradycardia and previously intolerant to Imdur. Would not start Heparin unless she has recurrent chest pain.  2. CAD - She is s/p CABG in 2009 with LIMA-LAD, SVG-RI and SVG-RCA with low-risk NST in 12/2018. Will plan for LHC as outlined above given her  recent symptoms.  - Continue ASA 81mg  daily, Ranexa 1000mg  BID and Crestor (will titrate from 5mg  daily to 20mg  daily as outlined below).  3. Sinus Bradycardia - She has known baseline bradycardia and her HR has been in the 40's to 50's. No evidence of high-grade block or significant pauses. Continue to avoid AV nodal blocking agents.   4. HTN - BP was initially elevated, improved to 133/66 on most recent check. She has been continued on PTA Amlodipine 10mg  daily.   5. HLD - FLP in 01/2021 showed total cholesterol of 214, triglycerides 108, HDL 57 and LDL 138. She has been continued on PTA Crestor 5mg  daily. Will titrate  Crestor to 20mg  daily and recheck FLP and LFT's in 8 weeks. If unable to tolerate, would recommend referral to the [facility] as an outpatient.   Risk Assessment/Risk Scores:     HEAR Score (for undifferentiated chest pain):  HEAR Score: 5  For questions or updates, please contact [facility] Please consult [email or web address] for contact info under    Signed, Ellsworth LennoxBrittany M Strader, PA-C  04/24/2021 8:48 AM   Attending note:  Patient seen and examined.  I reviewed her records and discussed the case with Ms. Patrick JupiterStrader PA-C, I agree with her above findings.  Ms. [name] is a patient of Dr. Wyline MoodBranch, last seen in July 2021.  She has a history of CAD status post CABG in 2009, now presents with recurring episodes of chest tightness and shortness of breath, typical and atypical features, reported response to nitroglycerin a few days ago, prolonged episode prior to ER visit with normal high-sensitivity troponin I levels.  Last ischemic assessment was in 2020, low risk at that time.  On examination in the ER she is in no acute distress.  Currently afebrile, heart rate 50 and sinus bradycardia by telemetry which I personally reviewed.  Systolic blood pressure 130s.  Lungs are clear.  Cardiac exam with RRR no gallop or rub.  Pertinent lab work includes potassium 4.6, BUN 13, creatinine 0.7, high-sensitivity troponin I levels of 3, hemoglobin 11.5, platelets 363.  Chest x-ray reports no acute process.  I personally reviewed her ECG which shows sinus bradycardia, no acute ST segment changes over baseline.  Patient presents with recurring chest discomfort and shortness of breath concerning for unstable angina.  Diagnostic cardiac catheterization discussed with patient and after reviewing the risks and benefits she is in agreement to proceed.  [facility] today.  Echocardiogram will be obtained as well.  Continue aspirin, Ranexa, Norvasc and Crestor which will  be uptitrated, not on beta-blocker given resting bradycardia.  Jonelle SidleSamuel G. Aidan Moten, M.D., F.A.C.C.

## 2021-04-24 NOTE — H&P (View-Only) (Signed)
Cardiology Consultation:   Patient ID: [name] MRN: [id number]; DOB: 1966/07/24  Admit date: 04/23/2021 Date of Consult: 04/24/2021  PCP:  Heather Roberts, NP   [facility] Providers Cardiologist:  Dina Rich, MD        Patient Profile:   [name] is a 55 y.o. female with past medical history of CAD (s/p CABG in 2009 with LIMA-LAD, SVG-RI and SVG-RCA with low-risk NST in 12/2018), HFpEF, HTN, HLD, COPD and OSA (on CPAP) who is being seen 04/24/2021 for the evaluation of chest pain at the request of Dr. Arbutus Leas.  History of Present Illness:   [name] was last examined by Dr. Wyline Mood in 04/2020 and reported having worsening dyspnea on exertion with associated wheezing at times and chest fullness but denied any exertional chest pain.  It was recommended that she have a repeat echocardiogram and pending results may consider a R/LHC for definitive evaluation. It appears that her echocardiogram was ordered but never performed and she has not been evaluated by Cardiology since.  She called the office yesterday morning reporting chest discomfort which radiated to her shoulder since 0400 with associated dyspnea and nausea. She was advised to go to the ED for further evaluation.   In talking with the patient today, she reports having episodes of chest pressure and worsening dyspnea over the past 2-3 weeks. Could occur at rest or with activity but episodes typically lasted for less than 10 minutes then resolved. Yesterday morning, she developed pressure along her left pectoral region which radiated into her left shoulder and down her left arm. Reports associated nausea and diaphoresis. She took 2 SL NTG at home with some improvement in symptoms but her pain persisted which prompted her to come to the ED. Her pain lasted throughout most of the day and resolved yesterday evening. She denies any recurrent pain this morning.  Initial labs showed WBC 7.0, Hgb 11.6, platelets 377, Na+  138, K+ 3.7 and creatinine 0.62. Initial Hs Troponin 3 with repeat of 3. COVID negative. CXR with no active cardiopulmonary disease. EKG shows sinus bradycardia, HR 50 with no acute ST changes when compared to prior tracings.   Past Medical History:  Diagnosis Date   ADD (attention deficit disorder with hyperactivity)    Anxiety    Bipolar 1 disorder (HCC)    Carotid artery disease (HCC)    Right 50-69% stenosis by doppler 2010   Chronic diastolic (congestive) heart failure (HCC)    COPD (chronic obstructive pulmonary disease) (HCC)    [employer]reesia 12/15/2020   Coronary artery disease    a. s/p CABG in 2009 with LIMA-LAD, SVG-RI, and SVG-RCA b. low-risk NST in 10/2015 c. low-risk NST in 12/2018.   CVA (cerebral infarction) 12/2008   Reports a neurologist told her she had a blood clot in her brain   Depression    Depression    [employer]reesia 12/15/2020   Emphysema of lung (HCC) 06/19/2018   Fibromyalgia    GERD (gastroesophageal reflux disease)    Hyperlipidemia    Hypertension    Myocardial infarction (HCC) 09/18/2011   [employer]reesia 12/15/2020   OSA on CPAP 12/31/2018   Sleep apnea    [employer]reesia 12/15/2020   Stroke (HCC)    [employer]reesia 12/15/2020   Tobacco abuse     Past Surgical History:  Procedure Laterality Date   CARDIAC CATHETERIZATION     CORONARY ARTERY BYPASS GRAFT  2009   3V CABG LIMA --> LAD, SVG --> Ramus Intermediate,  SVG -->  RCA   TUBAL LIGATION  1990     Home Medications:  Prior to Admission medications   Medication Sig Start Date End Date Taking? Authorizing Provider  Albuterol Sulfate (PROAIR RESPICLICK) 108 (90 Base) MCG/ACT AEPB Inhale 2 puffs into the lungs every 6 (six) hours as needed. 03/31/21  Yes Heather Roberts, NP  ALPRAZolam Prudy Feeler) 0.5 MG tablet Take 1 tablet (0.5 mg total) by mouth 3 (three) times daily as needed. 01/05/21  Yes Mozingo, Thereasa Solo, NP  amLODipine (NORVASC) 10 MG tablet Take 1 tablet (10 mg total) by mouth daily. 03/19/21  Yes Heather Roberts, NP   aspirin EC 81 MG tablet Take 81 mg by mouth daily.   Yes [provider]  budesonide-formoterol (SYMBICORT) 160-4.5 MCG/ACT inhaler Inhale 2 puffs into the lungs 2 (two) times daily.   Yes [provider]  Cholecalciferol (VITAMIN D3 PO) Take 1 tablet by mouth daily.   Yes [provider]  cyclobenzaprine (FLEXERIL) 10 MG tablet Take 10 mg by mouth 2 (two) times daily as needed for muscle spasms.    Yes [provider]  diphenoxylate-atropine (LOMOTIL) 2.5-0.025 MG tablet Take 2.5 mg by mouth daily as needed.    Yes [provider]  esomeprazole (NEXIUM) 40 MG capsule Take 40 mg by mouth daily. 03/20/21  Yes [provider]  FLUoxetine (PROZAC) 40 MG capsule TAKE TWO CAPSULES BY MOUTH ONCE DAILY Patient taking differently: Take 40 mg by mouth 2 (two) times daily. 03/19/21  Yes Heather Roberts, NP  furosemide (LASIX) 40 MG tablet Take 1 tablet (40 mg total) by mouth daily. 01/02/19  Yes Erick Blinks, MD  ibuprofen (ADVIL) 800 MG tablet Take 800 mg by mouth every 8 (eight) hours as needed. 01/12/20  Yes [provider]  Multiple Vitamins-Minerals (CENTRUM SILVER PO) Take 1 tablet by mouth daily.   Yes [provider]  nitroGLYCERIN (NITROSTAT) 0.4 MG SL tablet Place 1 tablet (0.4 mg total) under the tongue every 5 (five) minutes x 3 doses as needed for chest pain (if no relief after 3rd dose, proceed to the ED for an evaluation). 11/21/17  Yes Laqueta Linden, MD  ondansetron (ZOFRAN) 4 MG tablet Take 4 mg by mouth every 8 (eight) hours as needed. 11/01/19  Yes [provider]  potassium chloride (KLOR-CON) 20 MEQ packet Take 20 mEq by mouth 2 (two) times daily. 12/06/18  Yes Laqueta Linden, MD  ranolazine (RANEXA) 1000 MG SR tablet Take 1 tablet (1,000 mg total) by mouth 2 (two) times daily. 10/08/19  Yes Laqueta Linden, MD  rOPINIRole (REQUIP) 1 MG tablet Take 1 tablet (1 mg total) by mouth at bedtime as  needed (for restless leg). 03/24/21  Yes Heather Roberts, NP  rosuvastatin (CRESTOR) 5 MG tablet Take 1 tablet (5 mg total) by mouth daily. 03/23/16  Yes Laqueta Linden, MD  vitamin B-12 (CYANOCOBALAMIN) 100 MCG tablet Take 100 mcg by mouth daily.   Yes [provider]  zolpidem (AMBIEN) 10 MG tablet TAKE 1 TABLET BY MOUTH EVERY EVENING AT BEDTIME 03/19/21  Yes Heather Roberts, NP  gabapentin (NEURONTIN) 300 MG capsule Take 1 capsule by mouth 4 (four) times daily. Patient not taking: Reported on 04/23/2021 11/20/18   [provider]  pantoprazole (PROTONIX) 40 MG tablet Take 1 tablet (40 mg total) by mouth daily. Patient not taking: No sig reported 01/15/21   Heather Roberts, NP    Inpatient Medications: Scheduled Meds:  amLODipine  10 mg Oral Daily   aspirin EC  81 mg Oral Daily   enoxaparin (LOVENOX) injection  40 mg Subcutaneous Q24H   FLUoxetine  40 mg Oral BID   furosemide  40 mg Oral Daily   mometasone-formoterol  2 puff Inhalation BID   pantoprazole  40 mg Oral Daily   potassium chloride  20 mEq Oral BID   ranolazine  1,000 mg Oral BID   rosuvastatin  5 mg Oral Daily   Continuous Infusions:  PRN Meds: acetaminophen **OR** acetaminophen, ALPRAZolam, ondansetron **OR** ondansetron (ZOFRAN) IV, polyethylene glycol, rOPINIRole  Allergies:    Allergies  Allergen Reactions   Other    Imdur [Isosorbide Nitrate] Other (See Comments)    Headaches; Worsening Chest Pain    Social History:   Social History   Socioeconomic History   Marital status: Married    Spouse name: Not on file   Number of children: Not on file   Years of education: Not on file   Highest education level: Not on file  Occupational History   Occupation: [employer]    Comment: Investment banker, operational- in Set designer  Tobacco Use   Smoking status: Former    Packs/day: 0.50    Years: 35.00    Pack years: 17.50    Types: Cigarettes    Start date: 12/14/1981    Quit date: 12/17/2018     Years since quitting: 2.3   Smokeless tobacco: Never  Vaping Use   Vaping Use: Every day   Substances: Nicotine, Flavoring  Substance and Sexual Activity   Alcohol use: Not Currently    Comment: rarely- had a margarita on birthday   Drug use: No   Sexual activity: Yes    Birth control/protection: Post-menopausal  Other Topics Concern   Not on file  Social History Narrative   Married with 2 children who are grown; has pets   Social Determinants of Corporate investment banker Strain: Not on file  Food Insecurity: Not on file  Transportation Needs: Not on file  Physical Activity: Not on file  Stress: Not on file  Social Connections: Not on file  Intimate Partner Violence: Not on file    Family History:    Family History  Problem Relation Age of Onset   CAD Mother    Intellectual disability Maternal Aunt    Intellectual disability Maternal Uncle    Alcohol abuse Paternal Grandfather      ROS:  Please see the history of present illness.   All other ROS reviewed and negative.     Physical Exam/Data:   Vitals:   04/24/21 0700 04/24/21 0730 04/24/21 0800 04/24/21 0830  BP: 132/65 130/64 134/60 133/66  Pulse: (!) 46 (!) 51 (!) 47 (!) 49  Resp: 19 (!) 23 18 19   Temp:      TempSrc:      SpO2: 97% 97% 95% 99%  Weight:      Height:       No intake or output data in the 24 hours ending 04/24/21 0848 Last 3 Weights 04/23/2021 03/31/2021 12/18/2020  Weight (lbs) 145 lb 140 lb 140 lb  Weight (kg) 65.772 kg 63.504 kg 63.504 kg  Some encounter information is confidential and restricted. Go to Review Flowsheets activity to see all data.     Body mass index is 26.52 kg/m.  General:  Well nourished, well developed female appearing in no acute distress HEENT: normal Lymph: no adenopathy Neck: no JVD Endocrine:  No thryomegaly  Vascular: No carotid bruits; FA pulses 2+ bilaterally without bruits  Cardiac:  normal S1, S2; RRR; no murmur Lungs:  clear to auscultation  bilaterally, no wheezing, rhonchi or rales  Abd: soft, nontender, no hepatomegaly  Ext: no edema Musculoskeletal:  No deformities, BUE and BLE strength normal and equal Skin: warm and dry  Neuro:  CNs 2-12 intact, no focal abnormalities noted Psych:  Normal affect   EKG:  The EKG was personally reviewed and demonstrates: Sinus bradycardia, HR 50 with no acute ST changes when compared to prior tracings.   Telemetry:  Telemetry was personally reviewed and demonstrates: Sinus bradycardia, HR in 40's to 60's.   Relevant CV Studies:  NST: 12/2018 Blood pressure demonstrated a hypertensive response to exercise. Horizontal ST segment depression ST segment depression of 0.5 mm was noted during recovery in the II, III, aVF, V5 and V6 leads. The patient was asymptomatic. The study is normal. No perfusion defects consistent with myocardial ischemia or scar. This is a low risk study. Nuclear stress EF: 71%.  Laboratory Data:  High Sensitivity Troponin:   Recent Labs  Lab 04/23/21 1523 04/23/21 1651  TROPONINIHS 3 3     Chemistry Recent Labs  Lab 04/23/21 1523 04/24/21 0340  NA 138 138  K 3.7 4.6  CL 102 102  CO2 26 24  GLUCOSE 97 97  BUN 11 13  CREATININE 0.62 0.70  CALCIUM 9.5 9.5  GFRNONAA >60 >60  ANIONGAP 10 12    No results for input(s): PROT, ALBUMIN, AST, ALT, ALKPHOS, BILITOT in the last 168 hours. Hematology Recent Labs  Lab 04/23/21 1523 04/24/21 0340  WBC 7.0 6.2  RBC 4.08 4.03  HGB 11.6* 11.5*  HCT 36.8 37.5  MCV 90.2 93.1  MCH 28.4 28.5  MCHC 31.5 30.7  RDW 13.8 14.0  PLT 377 363   BNPNo results for input(s): BNP, PROBNP in the last 168 hours.  DDimer No results for input(s): DDIMER in the last 168 hours.   Radiology/Studies:  DG Chest 2 View  Result Date: 04/23/2021 CLINICAL DATA:  Chest pain, sob, nausea since 4 am, COPD Pos covid 12/01/20 chest pain EXAM: CHEST - 2 VIEW COMPARISON:  None. FINDINGS: Sternotomy wires overlie normal cardiac  silhouette. Normal pulmonary vasculature. No effusion, infiltrate, or pneumothorax. No acute osseous abnormality. IMPRESSION: No active cardiopulmonary disease. Electronically Signed   By: Genevive Bi M.D.   On: 04/23/2021 15:14     Assessment and Plan:   1. Chest Pain concerning for Accelerating Angina - She reports a 2-3 week history of intermittent chest pressure and dyspnea at rest and with activity but more persistent symptoms yesterday. - Hs Troponin values have been negative and her EKG is without acute ST changes. Echocardiogram is pending to assess for any structural abnormalities.  - Reviewed with Dr. Diona Browner and will plan for a cardiac catheterization today for definitive evaluation. The patient is in agreement as well.  - The patient understands that risks include but are not limited to stroke (1 in 1000), death (1 in 1000), kidney failure [usually temporary] (1 in 500), bleeding (1 in 200), allergic reaction [possibly serious] (1 in 200).   - Continue ASA, Ranexa and statin therapy. No BB due to baseline bradycardia and previously intolerant to Imdur. Would not start Heparin unless she has recurrent chest pain.  2. CAD - She is s/p CABG in 2009 with LIMA-LAD, SVG-RI and SVG-RCA with low-risk NST in 12/2018. Will plan for LHC as outlined above given her  recent symptoms.  - Continue ASA 81mg  daily, Ranexa 1000mg  BID and Crestor (will titrate from 5mg  daily to 20mg  daily as outlined below).  3. Sinus Bradycardia - She has known baseline bradycardia and her HR has been in the 40's to 50's. No evidence of high-grade block or significant pauses. Continue to avoid AV nodal blocking agents.   4. HTN - BP was initially elevated, improved to 133/66 on most recent check. She has been continued on PTA Amlodipine 10mg  daily.   5. HLD - FLP in 01/2021 showed total cholesterol of 214, triglycerides 108, HDL 57 and LDL 138. She has been continued on PTA Crestor 5mg  daily. Will titrate  Crestor to 20mg  daily and recheck FLP and LFT's in 8 weeks. If unable to tolerate, would recommend referral to the [facility] as an outpatient.   Risk Assessment/Risk Scores:     HEAR Score (for undifferentiated chest pain):  HEAR Score: 5  For questions or updates, please contact [facility] Please consult [email or web address] for contact info under    Signed, Ellsworth LennoxBrittany M Strader, PA-C  04/24/2021 8:48 AM   Attending note:  Patient seen and examined.  I reviewed her records and discussed the case with Ms. Patrick JupiterStrader PA-C, I agree with her above findings.  Ms. [name] is a patient of Dr. Wyline MoodBranch, last seen in July 2021.  She has a history of CAD status post CABG in 2009, now presents with recurring episodes of chest tightness and shortness of breath, typical and atypical features, reported response to nitroglycerin a few days ago, prolonged episode prior to ER visit with normal high-sensitivity troponin I levels.  Last ischemic assessment was in 2020, low risk at that time.  On examination in the ER she is in no acute distress.  Currently afebrile, heart rate 50 and sinus bradycardia by telemetry which I personally reviewed.  Systolic blood pressure 130s.  Lungs are clear.  Cardiac exam with RRR no gallop or rub.  Pertinent lab work includes potassium 4.6, BUN 13, creatinine 0.7, high-sensitivity troponin I levels of 3, hemoglobin 11.5, platelets 363.  Chest x-ray reports no acute process.  I personally reviewed her ECG which shows sinus bradycardia, no acute ST segment changes over baseline.  Patient presents with recurring chest discomfort and shortness of breath concerning for unstable angina.  Diagnostic cardiac catheterization discussed with patient and after reviewing the risks and benefits she is in agreement to proceed.  [facility] later today.  Echocardiogram will be obtained as well.  Continue aspirin, Ranexa, Norvasc and Crestor which will  be uptitrated, not on beta-blocker given resting bradycardia.  Jonelle SidleSamuel G. Mirtha Jain, M.D., F.A.C.C.

## 2021-04-24 NOTE — Interval H&P Note (Signed)
History and Physical Interval Note:  04/24/2021 2:33 PM  Aaron Edelman Scalia  has presented today for surgery, with the diagnosis of unstable angina.  The various methods of treatment have been discussed with the patient and family. After consideration of risks, benefits and other options for treatment, the patient has consented to  Procedure(s): LEFT HEART CATH AND CORS/GRAFTS ANGIOGRAPHY (N/A) as a surgical intervention.  The patient's history has been reviewed, patient examined, no change in status, stable for surgery.  I have reviewed the patient's chart and labs.  Questions were answered to the patient's satisfaction.    Cath Lab Visit (complete for each Cath Lab visit)  Clinical Evaluation Leading to the Procedure:   ACS: No.  Non-ACS:    Anginal Classification: CCS III  Anti-ischemic medical therapy: Maximal Therapy (2 or more classes of medications)  Non-Invasive Test Results: No non-invasive testing performed  Prior CABG: Previous CABG        [name]

## 2021-04-24 NOTE — Progress Notes (Signed)
PROGRESS NOTE  [name] CVE:[id number] DOB: Apr 20, 1966 DOA: 04/23/2021 PCP: Heather Roberts, NP  Brief History:  55 year old female with a history of coronary artery disease with CABG 2009, diastolic CHF, COPD, hypertension, CVA, bipolar disorder, fibromyalgia, hyperlipidemia, tobacco abuse in remission, OSA presenting with chest pain that developed at 4 AM on 04/23/2021.  The patient stated that it was sharp in nature radiating to the left shoulder.  She stated that she took 2 nitroglycerin which gave her a little relief but continued to have chest pain.  She had associated nausea and shortness of breath.  As result, the patient presented to emergency department for further evaluation.  Upon further questioning, the patient states that she has been having off-and-on intermittent chest discomfort for the past week lasting about 30 minutes.  She had a little difficulty clarifying but stated that most of the chest discomfort occurred when she was at rest.  She did not take any nitroglycerin this past week up until the morning of 04/23/2021.  She denies any worsening shortness of breath.  She denies any fevers, chills, vomiting, diarrhea, abdominal pain, dysuria, hematuria. In the emergency department, the patient was afebrile and hemodynamically stable with oxygen saturation 96-97% on room air.  Troponins were 3>> 3.  BMP and CBC were unremarkable.  EKG showed sinus bradycardia with nonspecific T wave changes.  Chest x-ray was negative for any infiltrates or edema.  Assessment/Plan: Chest pain -Troponins unremarkable -EKG is reassuring -Cardiology consult -Echocardiogram -Continue aspirin -Continue Ranexa  Chronic diastolic CHF -Continue home dose furosemide -Clinically euvolemic  Essential hypertension -Continue amlodipine  Hyperlipidemia -Continue Crestor  Bipolar disorder -Continue home dose fluoxetine and alprazolam  COPD -Stable on room air -Continue  bronchodilators       Status is: Observation  The patient remains OBS appropriate and will d/c before 2 midnights.  Dispo: The patient is from: Home              Anticipated d/c is to: Home              Patient currently is not medically stable to d/c.   Difficult to place patient No        Family Communication:  no Family at bedside  Consultants:  cardiology  Code Status:  FULL  DVT Prophylaxis:  [place] Lovenox   Procedures: As Listed in Progress Note Above  Antibiotics: None       Subjective: Patient denies fevers, chills, headache, chest pain, dyspnea, nausea, vomiting, diarrhea, abdominal pain, dysuria, hematuria, hematochezia, and melena.   Objective: Vitals:   04/24/21 0430 04/24/21 0500 04/24/21 0530 04/24/21 0653  BP: 126/69 133/65 139/66   Pulse: (!) 42 (!) 44 (!) 46   Resp: 17 14 16    Temp:      TempSrc:      SpO2: 97% 96% 96% 100%  Weight:      Height:       No intake or output data in the 24 hours ending 04/24/21 0739 Weight change:  Exam:  General:  Pt is alert, follows commands appropriately, not in acute distress HEENT: No icterus, No thrush, No neck mass, Halfway/AT Cardiovascular: RRR, S1/S2, no rubs, no gallops Respiratory: CTA bilaterally, no wheezing, no crackles, no rhonchi Abdomen: Soft/+BS, non tender, non distended, no guarding Extremities: No edema, No lymphangitis, No petechiae, No rashes, no synovitis   Data Reviewed: I have personally reviewed following labs and imaging studies Basic Metabolic  Panel: Recent Labs  Lab 04/23/21 1523 04/24/21 0340  NA 138 138  K 3.7 4.6  CL 102 102  CO2 26 24  GLUCOSE 97 97  BUN 11 13  CREATININE 0.62 0.70  CALCIUM 9.5 9.5   Liver Function Tests: No results for input(s): AST, ALT, ALKPHOS, BILITOT, PROT, ALBUMIN in the last 168 hours. No results for input(s): LIPASE, AMYLASE in the last 168 hours. No results for input(s): AMMONIA in the last 168 hours. Coagulation Profile: No  results for input(s): INR, PROTIME in the last 168 hours. CBC: Recent Labs  Lab 04/23/21 1523 04/24/21 0340  WBC 7.0 6.2  HGB 11.6* 11.5*  HCT 36.8 37.5  MCV 90.2 93.1  PLT 377 363   Cardiac Enzymes: No results for input(s): CKTOTAL, CKMB, CKMBINDEX, TROPONINI in the last 168 hours. BNP: Invalid input(s): POCBNP CBG: No results for input(s): GLUCAP in the last 168 hours. HbA1C: No results for input(s): HGBA1C in the last 72 hours. Urine analysis:    Component Value Date/Time   COLORURINE YELLOW 12/03/2009 2300   APPEARANCEUR CLEAR 12/03/2009 2300   LABSPEC 1.024 12/03/2009 2300   PHURINE 6.0 12/03/2009 2300   GLUCOSEU NEGATIVE 12/03/2009 2300   HGBUR NEGATIVE 12/03/2009 2300   BILIRUBINUR NEGATIVE 12/03/2009 2300   KETONESUR 15 (A) 12/03/2009 2300   PROTEINUR NEGATIVE 12/03/2009 2300   UROBILINOGEN 1.0 12/03/2009 2300   NITRITE NEGATIVE 12/03/2009 2300   LEUKOCYTESUR  12/03/2009 2300    NEGATIVE MICROSCOPIC NOT DONE ON URINES WITH NEGATIVE PROTEIN, BLOOD, LEUKOCYTES, NITRITE, OR GLUCOSE <1000 mg/dL.   Sepsis Labs: @LABRCNTIP (procalcitonin:4,lacticidven:4) ) Recent Results (from the past 240 hour(s))  Resp Panel by RT-PCR (Flu A&B, Covid) Nasopharyngeal Swab     Status: None   Collection Time: 04/23/21  5:50 PM   Specimen: Nasopharyngeal Swab; Nasopharyngeal(NP) swabs in vial transport medium  Result Value Ref Range Status   SARS Coronavirus 2 by RT PCR NEGATIVE NEGATIVE Final    Comment: (NOTE) SARS-CoV-2 target nucleic acids are NOT DETECTED.  The SARS-CoV-2 RNA is generally detectable in upper respiratory specimens during the acute phase of infection. The lowest concentration of SARS-CoV-2 viral copies this assay can detect is 138 copies/mL. A negative result does not preclude SARS-Cov-2 infection and should not be used as the sole basis for treatment or other patient management decisions. A negative result may occur with  improper specimen  collection/handling, submission of specimen other than nasopharyngeal swab, presence of viral mutation(s) within the areas targeted by this assay, and inadequate number of viral copies(<138 copies/mL). A negative result must be combined with clinical observations, patient history, and epidemiological information. The expected result is Negative.  Fact Sheet for Patients:  04/25/21  Fact Sheet for Healthcare Providers:  BloggerCourse.com  This test is no t yet approved or cleared by the [employer] FDA and  has been authorized for detection and/or diagnosis of SARS-CoV-2 by FDA under an Emergency Use Authorization (EUA). This EUA will remain  in effect (meaning this test can be used) for the duration of the COVID-19 declaration under Section 564(b)(1) of the Act, 21 U.S.C.section 360bbb-3(b)(1), unless the authorization is terminated  or revoked sooner.       Influenza A by PCR NEGATIVE NEGATIVE Final   Influenza B by PCR NEGATIVE NEGATIVE Final    Comment: (NOTE) The Xpert Xpress SARS-CoV-2/FLU/RSV plus assay is intended as an aid in the diagnosis of influenza from Nasopharyngeal swab specimens and should not be used as a sole basis for treatment.  Nasal washings and aspirates are unacceptable for Xpert Xpress SARS-CoV-2/FLU/RSV testing.  Fact Sheet for Patients: BloggerCourse.com  Fact Sheet for Healthcare Providers: [employer]it  This test is not yet approved or cleared by the [place] FDA and has been authorized for detection and/or diagnosis of SARS-CoV-2 by FDA under an Emergency Use Authorization (EUA). This EUA will remain in effect (meaning this test can be used) for the duration of the COVID-19 declaration under Section 564(b)(1) of the Act, 21 U.S.C. section 360bbb-3(b)(1), unless the authorization is terminated or revoked.  Performed at [facility], [address], [place], [place] [address]      Scheduled Meds:  amLODipine  10 mg Oral Daily   aspirin EC  81 mg Oral Daily   enoxaparin (LOVENOX) injection  40 mg Subcutaneous Q24H   FLUoxetine  40 mg Oral BID   furosemide  40 mg Oral Daily   mometasone-formoterol  2 puff Inhalation BID   pantoprazole  40 mg Oral Daily   potassium chloride  20 mEq Oral BID   ranolazine  1,000 mg Oral BID   rosuvastatin  5 mg Oral Daily   Continuous Infusions:  Procedures/Studies: DG Chest 2 View  Result Date: 04/23/2021 CLINICAL DATA:  Chest pain, sob, nausea since 4 am, COPD Pos covid 12/01/20 chest pain EXAM: CHEST - 2 VIEW COMPARISON:  None. FINDINGS: Sternotomy wires overlie normal cardiac silhouette. Normal pulmonary vasculature. No effusion, infiltrate, or pneumothorax. No acute osseous abnormality. IMPRESSION: No active cardiopulmonary disease. Electronically Signed   By: Genevive Bi M.D.   On: 04/23/2021 15:14    Catarina Hartshorn, DO  [facility]  If 7PM-7AM, please contact night-coverage [email or web address] Password [email or web address] 04/24/2021, 7:39 AM   LOS: 0 days

## 2021-04-25 DIAGNOSIS — R072 Precordial pain: Secondary | ICD-10-CM

## 2021-04-25 LAB — CBC
HCT: 36.2 % (ref 36.0–46.0)
Hemoglobin: 11.4 g/dL — ABNORMAL LOW (ref 12.0–15.0)
MCH: 28.4 pg (ref 26.0–34.0)
MCHC: 31.5 g/dL (ref 30.0–36.0)
MCV: 90 fL (ref 80.0–100.0)
Platelets: 335 10*3/uL (ref 150–400)
RBC: 4.02 MIL/uL (ref 3.87–5.11)
RDW: 14.3 % (ref 11.5–15.5)
WBC: 6 10*3/uL (ref 4.0–10.5)
nRBC: 0 % (ref 0.0–0.2)

## 2021-04-25 LAB — BASIC METABOLIC PANEL
Anion gap: 9 (ref 5–15)
BUN: 13 mg/dL (ref 6–20)
CO2: 23 mmol/L (ref 22–32)
Calcium: 9.3 mg/dL (ref 8.9–10.3)
Chloride: 104 mmol/L (ref 98–111)
Creatinine, Ser: 0.77 mg/dL (ref 0.44–1.00)
GFR, Estimated: >60 mL/min (ref >60)
Glucose, Bld: 117 mg/dL — ABNORMAL HIGH (ref 70–99)
Potassium: 4.6 mmol/L (ref 3.5–5.1)
Sodium: 136 mmol/L (ref 135–145)

## 2021-04-25 MED ORDER — ROSUVASTATIN CALCIUM 20 MG PO TABS: 20.0000 mg | ORAL_TABLET | Freq: Every day | ORAL | 6 refills | Status: DC

## 2021-04-25 NOTE — Discharge Summary (Addendum)
Discharge Summary    Patient ID: [name] MRN: [id number]; DOB: 1965/12/22  Admit date: 04/23/2021 Discharge date: 04/25/2021  PCP:  Heather Roberts, NP   [facility] Providers Cardiologist:  Dina Rich, MD   {  Discharge Diagnoses    Principal Problem:   Chest pain Active Problems:   RESTLESS LEG SYNDROME   Essential hypertension   Chronic diastolic CHF (congestive heart failure) (HCC)   S/P CABG x 3   Bipolar 1 disorder (HCC)   Unstable angina (HCC)  Diagnostic Studies/Procedures    Echo 04/24/21  1. Left ventricular ejection fraction, by estimation, is 65 to 70%. The  left ventricle has normal function. The left ventricle has no regional  wall motion abnormalities. Left ventricular diastolic parameters were  normal.   2. Right ventricular systolic function is normal. The right ventricular  size is mildly enlarged. Tricuspid regurgitation signal is inadequate for  assessing PA pressure.   3. The mitral valve is grossly normal. Trivial mitral valve  regurgitation.   4. The aortic valve is tricuspid. Aortic valve regurgitation is not  visualized. Aortic valve mean gradient measures 4.0 mmHg.   5. The inferior vena cava is normal in size with greater than 50%  respiratory variability, suggesting right atrial pressure of 3 mmHg.   LEFT HEART CATH AND CORS/GRAFTS ANGIOGRAPHY    Conclusion    Prox RCA to Mid RCA lesion is 100% stenosed. LIMA graft was visualized by angiography. SVG graft was visualized by angiography. Origin to Prox Graft lesion is 100% stenosed.   1. No obstructive disease in the LAD, Circumflex or intermediate branch. The graft to the intermediate is chronically occluded as the native vessel has no disease. The LIMA to the mid LAD is atretic as the native LAD has no obstructive disease. I suspect that the ruptured plaque noted in the proximal LAD in 2009 may have been SCAD that has now healed. 2. Chronic occlusion proximal RCA. The vein  graft to the distal RCA is patent 3. Normal LV filling pressures   Recommendations: No further ischemic workup. Continue medical management of CAD Diagnostic Dominance: Right     History of Present Illness     [name] is a 55 y.o. female with with past medical history of CAD (s/p CABG in 2009 with LIMA-LAD, SVG-RI and SVG-RCA with low-risk NST in 12/2018), HFpEF, HTN, HLD, COPD and OSA (on CPAP) who is transferred from [facility] for cath.   [name] was last examined by Dr. Wyline Mood in 04/2020 and reported having worsening dyspnea on exertion with associated wheezing at times and chest fullness but denied any exertional chest pain.  It was recommended that she have a repeat echocardiogram and pending results may consider a R/LHC for definitive evaluation. It appears that her echocardiogram was ordered but never performed and she has not been evaluated by Cardiology since.  Presented to [facility] with chest pressure and worsening dyspnea over the past 2-3 weeks. Could occur at rest or with activity but episodes typically lasted for less than 10 minutes then resolved. She developed pressure along her left pectoral region which radiated into her left shoulder and down her left arm. Reports associated nausea and diaphoresis. She took 2 SL NTG at home with some improvement in symptoms but her pain persisted which prompted her to come to the ED.  EKG shows sinus bradycardia, HR 50 with no acute ST changes when compared to prior tracings. Troponin negative. She was admitted by IM and  seen by Dr. Diona BrownerMcDowell for consultation.   Hospital Course     Consultants: [facility]  Unstable angina - The patient was seen and examined by Dr. Diona BrownerMcDowell who recommended cath for further evaluation. Echo showed LVEF of 65-70%, no WM abnormality. Normal RV function. Cath showed chronic occlusion of pRCA with patent vein graft to dRCA. Chronically occluded SVG to intermediate branch. No obstructive disease in  the LAD, Circumflex or intermediate branch. Medical management of CAD. No recurrent symptoms.  - Continue aspirin, ranexa and crestor -No BB due to baseline bradycardia and previously intolerant to Imdur.  2. Sinus Bradycardia - She has known baseline bradycardia and her HR has been in the 40's to 50's. No evidence of high-grade block or significant pauses. Continue to avoid AV nodal blocking agents.   3. HTN - BP stable on Amlodipine 10mg  daily.   4. HLD - 01/29/2021: Cholesterol, Total 214; HDL 57; LDL Chol Calc (NIH) 138; Triglycerides 108  - Crestor increased to 20mg  this admission.  - Recheck FLP and LFT's in 8 weeks. If unable to tolerate, would recommend referral to the Lipid Clinic as an outpatient.  Did the patient have an acute coronary syndrome (MI, NSTEMI, STEMI, etc) this admission?:  No                               Did the patient have a percutaneous coronary intervention (stent / angioplasty)?:  No.     Discharge Vitals Blood pressure 104/62, pulse (!) 52, temperature 98.2 F (36.8 C), temperature source Oral, resp. rate 16, height 5\' 2"  (1.575 m), weight 65.8 kg, last menstrual period 10/22/2015, SpO2 96 %.  Filed Weights   04/23/21 1552  Weight: 65.8 kg   Physical Exam Constitutional:      Appearance: She is well-developed.  HENT:     Head: Normocephalic.  Cardiovascular:     Rate and Rhythm: Bradycardia present.     Heart sounds: Normal heart sounds.  Pulmonary:     Effort: Pulmonary effort is normal.     Breath sounds: Normal breath sounds.  Abdominal:     General: Bowel sounds are normal.     Palpations: Abdomen is soft.  Musculoskeletal:        General: Normal range of motion.     Cervical back: Normal range of motion and neck supple.  Skin:    General: Skin is warm and dry.  Neurological:     General: No focal deficit present.     Mental Status: She is alert and oriented to person, place, and time.  Psychiatric:        Mood and Affect: Mood  normal.        Behavior: Behavior normal.    Labs & Radiologic Studies    CBC Recent Labs    04/24/21 0340 04/25/21 0143  WBC 6.2 6.0  HGB 11.5* 11.4*  HCT 37.5 36.2  MCV 93.1 90.0  PLT 363 335   Basic Metabolic Panel Recent Labs    08/65/7805/11/15 0340 04/25/21 0143  NA 138 136  K 4.6 4.6  CL 102 104  CO2 24 23  GLUCOSE 97 117*  BUN 13 13  CREATININE 0.70 0.77  CALCIUM 9.5 9.3    High Sensitivity Troponin:   Recent Labs  Lab 04/23/21 1523 04/23/21 1651  TROPONINIHS 3 3   _____________  DG Chest 2 View  Result Date: 04/23/2021 CLINICAL DATA:  Chest pain, sob, nausea since 4 am, COPD Pos covid 12/01/20 chest pain EXAM: CHEST - 2 VIEW COMPARISON:  None. FINDINGS: Sternotomy wires overlie normal cardiac silhouette. Normal pulmonary vasculature. No effusion, infiltrate, or pneumothorax. No acute osseous abnormality. IMPRESSION: No active cardiopulmonary disease. Electronically Signed   By: Genevive Bi M.D.   On: 04/23/2021 15:14   CARDIAC CATHETERIZATION  Result Date: 04/24/2021  Prox RCA to Mid RCA lesion is 100% stenosed.  LIMA graft was visualized by angiography.  SVG graft was visualized by angiography.  Origin to Prox Graft lesion is 100% stenosed.  1. No obstructive disease in the LAD, Circumflex or intermediate branch. The graft to the intermediate is chronically occluded as the native vessel has no disease. The LIMA to the mid LAD is atretic as the native LAD has no obstructive disease. I suspect that the ruptured plaque noted in the proximal LAD in 2009 may have been SCAD that has now healed. 2. Chronic occlusion proximal RCA. The vein graft to the distal RCA is patent 3. Normal LV filling pressures Recommendations: No further ischemic workup. Continue medical management of CAD   ECHOCARDIOGRAM COMPLETE  Result Date: 04/24/2021    ECHOCARDIOGRAM REPORT   Patient Name:   [name] Date of Exam: 04/24/2021 Medical Rec #:  [id number]        Height:       62.0 in  Accession #:    [id number]       Weight:       145.0 lb Date of Birth:  1966-06-10        BSA:          1.667 m Patient Age:    55 years         BP:           165/60 mmHg Patient Gender: F                HR:           49 bpm. [facility] Procedure: 2D Echo, Cardiac Doppler and Color Doppler Indications:    R07.9* Chest pain, unspecified  History:        Patient has prior history of Echocardiogram examinations, most                 recent 01/01/2019. CHF, CAD, Stroke, Arrythmias:LBBB,                 Signs/Symptoms:Chest Pain; Risk Factors:Hypertension and Current                 Smoker. CABG X3.  Sonographer:    Mikki Harbor Referring Phys: [phone] Heloise Beecham EMOKPAE IMPRESSIONS  1. Left ventricular ejection fraction, by estimation, is 65 to 70%. The left ventricle has normal function. The left ventricle has no regional wall motion abnormalities. Left ventricular diastolic parameters were normal.  2. Right ventricular systolic function is normal. The right ventricular size is mildly enlarged. Tricuspid regurgitation signal is inadequate for assessing PA pressure.  3. The mitral valve is grossly normal. Trivial mitral valve regurgitation.  4. The aortic valve is tricuspid. Aortic valve regurgitation is not visualized. Aortic valve mean gradient measures 4.0 mmHg.  5. The inferior vena cava is normal in size with greater than 50% respiratory variability, suggesting right atrial pressure of 3 mmHg. FINDINGS  Left Ventricle: Left ventricular ejection fraction, by estimation, is 65 to 70%. The left ventricle has normal function. The left ventricle has no regional wall motion  abnormalities. The left ventricular internal cavity size was normal in size. There is  no left ventricular hypertrophy. Left ventricular diastolic parameters were normal. Right Ventricle: The right ventricular size is mildly enlarged. No increase in right ventricular wall thickness. Right ventricular systolic function is normal.  Tricuspid regurgitation signal is inadequate for assessing PA pressure. Left Atrium: Left atrial size was normal in size. Right Atrium: Right atrial size was normal in size. Pericardium: There is no evidence of pericardial effusion. Mitral Valve: The mitral valve is grossly normal. Trivial mitral valve regurgitation. MV peak gradient, 4.4 mmHg. The mean mitral valve gradient is 1.0 mmHg. Tricuspid Valve: The tricuspid valve is grossly normal. Tricuspid valve regurgitation is trivial. Aortic Valve: The aortic valve is tricuspid. Aortic valve regurgitation is not visualized. Aortic valve mean gradient measures 4.0 mmHg. Aortic valve peak gradient measures 9.7 mmHg. Aortic valve area, by VTI measures 2.26 cm. Pulmonic Valve: The pulmonic valve was grossly normal. Pulmonic valve regurgitation is trivial. Aorta: The aortic root is normal in size and structure. Venous: The inferior vena cava is normal in size with greater than 50% respiratory variability, suggesting right atrial pressure of 3 mmHg. IAS/Shunts: No atrial level shunt detected by color flow Doppler.  LEFT VENTRICLE PLAX 2D LVIDd:         4.44 cm  Diastology LVIDs:         2.77 cm  LV e' medial:    9.11 cm/s LV PW:         0.72 cm  LV E/e' medial:  12.3 LV IVS:        0.66 cm  LV e' lateral:   14.10 cm/s LVOT diam:     1.80 cm  LV E/e' lateral: 7.9 LV SV:         88 LV SV Index:   53 LVOT Area:     2.54 cm  RIGHT VENTRICLE RV Basal diam:  2.86 cm RV Mid diam:    2.91 cm LEFT ATRIUM             Index       RIGHT ATRIUM           Index LA diam:        3.70 cm 2.22 cm/m  RA Area:     11.80 cm LA Vol (A2C):   36.0 ml 21.59 ml/m RA Volume:   24.80 ml  14.87 ml/m LA Vol (A4C):   25.9 ml 15.53 ml/m LA Biplane Vol: 31.7 ml 19.01 ml/m  AORTIC VALVE AV Area (Vmax):    2.54 cm AV Area (Vmean):   2.56 cm AV Area (VTI):     2.26 cm AV Vmax:           156.00 cm/s AV Vmean:          88.200 cm/s AV VTI:            0.390 m AV Peak Grad:      9.7 mmHg AV Mean Grad:       4.0 mmHg LVOT Vmax:         156.00 cm/s LVOT Vmean:        88.900 cm/s LVOT VTI:          0.347 m LVOT/AV VTI ratio: 0.89  AORTA Ao Root diam: 2.60 cm MITRAL VALVE MV Area (PHT): 2.73 cm     SHUNTS MV Area VTI:   2.01 cm     Systemic VTI:  0.35 m MV Peak grad:  4.4  mmHg     Systemic Diam: 1.80 cm MV Mean grad:  1.0 mmHg MV Vmax:       1.05 m/s MV Vmean:      48.4 cm/s MV Decel Time: 278 msec MV E velocity: 112.00 cm/s MV A velocity: 79.30 cm/s MV E/A ratio:  1.41 Nona Dell MD Electronically signed by Nona Dell MD Signature Date/Time: 04/24/2021/11:44:26 AM    Final    Disposition   Pt is being discharged home today in good condition.  Follow-up Plans & Appointments     Follow-up Information     Netta Neat., NP Follow up on 05/28/2021.   Specialty: Cardiology Why: @11 :30am for hospital follow up with Dr. PA/NP Please arrive 15 minutes early Contact information: [address] [place] [phone]         [phone], NP. Schedule an appointment as soon as possible for a visit in 1 week(s).   Specialty: Nurse Practitioner Contact information: [address]  [address] [place] [phone]                  Discharge Medications   Allergies as of 04/25/2021       Reactions   Other    Imdur [isosorbide Nitrate] Other (See Comments)   Headaches; Worsening Chest Pain        Medication List     STOP taking these medications    pantoprazole 40 MG tablet Commonly known as: PROTONIX       TAKE these medications    ALPRAZolam 0.5 MG tablet Commonly known as: XANAX Take 1 tablet (0.5 mg total) by mouth 3 (three) times daily as needed.   amLODipine 10 MG tablet Commonly known as: NORVASC Take 1 tablet (10 mg total) by mouth daily.   aspirin EC 81 MG tablet Take 81 mg by mouth daily.   budesonide-formoterol 160-4.5 MCG/ACT inhaler Commonly known as: SYMBICORT Inhale 2 puffs into  the lungs 2 (two) times daily.   CENTRUM SILVER PO Take 1 tablet by mouth daily.   cyclobenzaprine 10 MG tablet Commonly known as: FLEXERIL Take 10 mg by mouth 2 (two) times daily as needed for muscle spasms.   diphenoxylate-atropine 2.5-0.025 MG tablet Commonly known as: LOMOTIL Take 2.5 mg by mouth daily as needed.   esomeprazole 40 MG capsule Commonly known as: NEXIUM Take 40 mg by mouth daily.   furosemide 40 MG tablet Commonly known as: LASIX Take 1 tablet (40 mg total) by mouth daily.   ibuprofen 800 MG tablet Commonly known as: ADVIL Take 800 mg by mouth every 8 (eight) hours as needed.   nitroGLYCERIN 0.4 MG SL tablet Commonly known as: NITROSTAT Place 1 tablet (0.4 mg total) under the tongue every 5 (five) minutes x 3 doses as needed for chest pain (if no relief after 3rd dose, proceed to the ED for an evaluation).   ondansetron 4 MG tablet Commonly known as: ZOFRAN Take 4 mg by mouth every 8 (eight) hours as needed.   potassium chloride 20 MEQ packet Commonly known as: KLOR-CON Take 20 mEq by mouth 2 (two) times daily.   ProAir RespiClick 108 (90 Base) MCG/ACT Aepb Generic drug: Albuterol Sulfate Inhale 2 puffs into the lungs every 6 (six) hours as needed.   ranolazine 1000 MG SR tablet Commonly known as: Ranexa Take 1 tablet (1,000 mg total) by mouth 2 (two) times daily.   rOPINIRole 1 MG tablet Commonly known  as: REQUIP Take 1 tablet (1 mg total) by mouth at bedtime as needed (for restless leg).   rosuvastatin 20 MG tablet Commonly known as: CRESTOR Take 1 tablet (20 mg total) by mouth daily. What changed:  medication strength how much to take   vitamin B-12 100 MCG tablet Commonly known as: CYANOCOBALAMIN Take 100 mcg by mouth daily.   VITAMIN D3 PO Take 1 tablet by mouth daily.   zolpidem 10 MG tablet Commonly known as: AMBIEN TAKE 1 TABLET BY MOUTH EVERY EVENING AT BEDTIME       ASK your doctor about these medications     FLUoxetine 40 MG capsule Commonly known as: PROZAC TAKE TWO CAPSULES BY MOUTH ONCE DAILY   gabapentin 300 MG capsule Commonly known as: NEURONTIN Take 1 capsule by mouth 4 (four) times daily.           Outstanding Labs/Studies   LFTS and Lipid panel in 8 weeks.   Duration of Discharge Encounter   Greater than 30 minutes including physician time.  SignedSharrell Ku Osmond, PA 04/25/2021, 8:24 AM  History and all data above reviewed.  Patient examined.  I agree with the findings as above.  No chest pain.  No SOB. The patient exam reveals COR:RRR  ,  Lungs: clear  ,  Abd: Positive bowel sounds, no rebound no guarding, Ext no edema , left radial access site no bleeding or bruising .  All available labs, radiology testing, previous records reviewed. Agree with documented assessment and plan.   Chest pain: No further angina.  Continue current therapy.  Meds as on discharge summary.    Fayrene Fearing Aubrii Sharpless  10:07 AM  04/25/2021

## 2021-05-04 ENCOUNTER — Other Ambulatory Visit: Payer: Self-pay

## 2021-05-04 ENCOUNTER — Ambulatory Visit (INDEPENDENT_AMBULATORY_CARE_PROVIDER_SITE_OTHER): Payer: BC Managed Care – PPO | Admitting: Family Medicine

## 2021-05-04 ENCOUNTER — Telehealth: Payer: Self-pay | Admitting: Family Medicine

## 2021-05-04 ENCOUNTER — Encounter: Payer: Self-pay | Admitting: Family Medicine

## 2021-05-04 ENCOUNTER — Ambulatory Visit (INDEPENDENT_AMBULATORY_CARE_PROVIDER_SITE_OTHER): Payer: BC Managed Care – PPO

## 2021-05-04 VITALS — BP 130/70 | HR 58 | Ht 62.0 in | Wt 140.0 lb

## 2021-05-04 DIAGNOSIS — E785 Hyperlipidemia, unspecified: Secondary | ICD-10-CM

## 2021-05-04 DIAGNOSIS — I2510 Atherosclerotic heart disease of native coronary artery without angina pectoris: Secondary | ICD-10-CM | POA: Diagnosis not present

## 2021-05-04 DIAGNOSIS — M79603 Pain in arm, unspecified: Secondary | ICD-10-CM

## 2021-05-04 DIAGNOSIS — R001 Bradycardia, unspecified: Secondary | ICD-10-CM | POA: Diagnosis not present

## 2021-05-04 DIAGNOSIS — I10 Essential (primary) hypertension: Secondary | ICD-10-CM

## 2021-05-04 DIAGNOSIS — M79602 Pain in left arm: Secondary | ICD-10-CM | POA: Diagnosis not present

## 2021-05-04 NOTE — Progress Notes (Signed)
Cardiology Office Note  Date: 05/04/2021   ID: [name], DOB August 07, 1966, MRN [id number]  PCP:  Heather Roberts, NP  Cardiologist:  Dina Rich, MD Electrophysiologist:  None   Chief Complaint: Pain in left arm from heart cath, recent heart cath for chest pain/chest pressure  History of Present Illness: [name] is a 55 y.o. female with a history of COPD, CAD, CVA, emphysema, fibromyalgia, GERD, chronic diastolic heart failure, carotid artery disease, bipolar disorder, ADD, anxiety, OSA on CPAP, tobacco abuse.  She recently presented to [facility] with chest pressure and worsening dyspnea over the prior 2 weeks.  Symptoms could occur at rest or with activity.  Typically lasting for less than 10 minutes then resolved.  She developed chest pressure along her left pectoral region which radiated to her left shoulder and left arm.  Reported associated nausea and diaphoresis.  Took 2 sublingual nitroglycerin at home with some improvement but pain persistent.  She was examined by Dr. Diona Browner who recommended cardiac catheterization.  Cath showed chronic occlusion of the RCA and patent vein graft D RCA.  Chronically occluded SVG to intermediate branch.  No obstructive disease in LAD, circumflex or intermediate branch.  Medical management recommended.  She was to continue aspirin, Ranexa and Crestor.  No beta-blocker due to baseline bradycardia.  She was continuing amlodipine 10 mg daily.  Crestor was increased to 20 mg during that admission.  She presents today with left arm pain which was the site of her radial access site for recent cardiac catheterization.  She states she had some significant pain when they were removing the catheter from her arm.  She states she went back to work for a few days and started having significant left arm pain.  She states the arm pain radiates from left radial area to medial forearm and left elbow.  She states she has been trying to take  NSAIDs including ibuprofen 800 mg daily with almost no relief.  She states the staff and catheterization suite stated she must of had a radial artery spasm.  Otherwise she denies any anginal or exertional symptoms, palpitations or arrhythmias, orthostatic symptoms, SOB or DOE, strokelike symptoms, PND, orthopnea, bleeding, claudication, DVT or PE-like symptoms, or lower extremity edema.  Past Medical History:  Diagnosis Date   ADD (attention deficit disorder with hyperactivity)    Anxiety    Bipolar 1 disorder (HCC)    Carotid artery disease (HCC)    Right 50-69% stenosis by doppler 2010   Chronic diastolic (congestive) heart failure (HCC)    COPD (chronic obstructive pulmonary disease) (HCC)    Phreesia 12/15/2020   Coronary artery disease    a. s/p CABG in 2009 with LIMA-LAD, SVG-RI, and SVG-RCA b. low-risk NST in 10/2015 c. low-risk NST in 12/2018.   CVA (cerebral infarction) 12/2008   Reports a neurologist told her she had a blood clot in her brain   Depression    Depression    Phreesia 12/15/2020   Emphysema of lung (HCC) 06/19/2018   Fibromyalgia    GERD (gastroesophageal reflux disease)    Hyperlipidemia    Hypertension    Myocardial infarction (HCC) 09/18/2011   Phreesia 12/15/2020   OSA on CPAP 12/31/2018   Sleep apnea    Phreesia 12/15/2020   Stroke (HCC)    Phreesia 12/15/2020   Tobacco abuse     Past Surgical History:  Procedure Laterality Date   CARDIAC CATHETERIZATION     CORONARY ARTERY BYPASS  GRAFT  2009   3V CABG LIMA --> LAD, SVG --> Ramus Intermediate,  SVG --> RCA   LEFT HEART CATH AND CORS/GRAFTS ANGIOGRAPHY N/A 04/24/2021   Procedure: LEFT HEART CATH AND CORS/GRAFTS ANGIOGRAPHY;  Surgeon: Kathleene HazelMcAlhany, Christopher D, MD;  Location: [facility] INVASIVE [facility];  Service: Cardiovascular;  Laterality: N/A;   TUBAL LIGATION  1990    Current Outpatient Medications  Medication Sig Dispense Refill   Albuterol Sulfate (PROAIR RESPICLICK) 108 (90 Base) MCG/ACT AEPB Inhale 2  puffs into the lungs every 6 (six) hours as needed. 1 each 5   ALPRAZolam (XANAX) 0.5 MG tablet Take 1 tablet (0.5 mg total) by mouth 3 (three) times daily as needed. 90 tablet 2   amLODipine (NORVASC) 10 MG tablet Take 1 tablet (10 mg total) by mouth daily. 90 tablet 3   aspirin EC 81 MG tablet Take 81 mg by mouth daily.     budesonide-formoterol (SYMBICORT) 160-4.5 MCG/ACT inhaler Inhale 2 puffs into the lungs 2 (two) times daily.     Cholecalciferol (VITAMIN D3 PO) Take 1 tablet by mouth daily.     cyclobenzaprine (FLEXERIL) 10 MG tablet Take 10 mg by mouth 2 (two) times daily as needed for muscle spasms.      diphenoxylate-atropine (LOMOTIL) 2.5-0.025 MG tablet Take 2.5 mg by mouth daily as needed.      esomeprazole (NEXIUM) 40 MG capsule Take 40 mg by mouth daily.     FLUoxetine (PROZAC) 40 MG capsule Take 40 mg by mouth 2 (two) times daily.     furosemide (LASIX) 40 MG tablet Take 1 tablet (40 mg total) by mouth daily. 30 tablet 0   gabapentin (NEURONTIN) 300 MG capsule Take 1 capsule by mouth 4 (four) times daily.     ibuprofen (ADVIL) 800 MG tablet Take 800 mg by mouth every 8 (eight) hours as needed.     Multiple Vitamins-Minerals (CENTRUM SILVER PO) Take 1 tablet by mouth daily.     nitroGLYCERIN (NITROSTAT) 0.4 MG SL tablet Place 1 tablet (0.4 mg total) under the tongue every 5 (five) minutes x 3 doses as needed for chest pain (if no relief after 3rd dose, proceed to the ED for an evaluation). 25 tablet 3   ondansetron (ZOFRAN) 4 MG tablet Take 4 mg by mouth every 8 (eight) hours as needed.     potassium chloride (KLOR-CON) 20 MEQ packet Take 20 mEq by mouth 2 (two) times daily. 60 tablet 6   ranolazine (RANEXA) 1000 MG SR tablet Take 1 tablet (1,000 mg total) by mouth 2 (two) times daily. 180 tablet 3   rOPINIRole (REQUIP) 1 MG tablet Take 1 tablet (1 mg total) by mouth at bedtime as needed (for restless leg). 30 tablet 2   rosuvastatin (CRESTOR) 20 MG tablet Take 1 tablet (20 mg  total) by mouth daily. 30 tablet 6   vitamin B-12 (CYANOCOBALAMIN) 100 MCG tablet Take 100 mcg by mouth daily.     zolpidem (AMBIEN) 10 MG tablet TAKE 1 TABLET BY MOUTH EVERY EVENING AT BEDTIME 90 tablet 0   No current facility-administered medications for this visit.   Allergies:  Other and Imdur [isosorbide nitrate]   Social History: The patient  reports that she quit smoking about 2 years ago. Her smoking use included cigarettes. She started smoking about 39 years ago. She has a 17.50 pack-year smoking history. She has never used smokeless tobacco. She reports previous alcohol use. She reports that she does not use drugs.   Family  History: The patient's family history includes Alcohol abuse in her paternal grandfather; CAD in her mother; Intellectual disability in her maternal aunt and maternal uncle.   ROS:  Please see the history of present illness. Otherwise, complete review of systems is positive for none.  All other systems are reviewed and negative.   Physical Exam: VS:  BP 130/70   Pulse (!) 58   Ht 5\' 2"  (1.575 m)   Wt 140 lb (63.5 kg)   LMP 10/22/2015   SpO2 98%   BMI 25.61 kg/m , BMI Body mass index is 25.61 kg/m.  Wt Readings from Last 3 Encounters:  05/04/21 140 lb (63.5 kg)  04/23/21 145 lb (65.8 kg)  03/31/21 140 lb (63.5 kg)    General: Patient appears comfortable at rest. Neck: Supple, no elevated JVP or carotid bruits, no thyromegaly. Lungs: Clear to auscultation, nonlabored breathing at rest. Cardiac: Regular rate and rhythm, no S3 or significant systolic murmur, no pericardial rub. Extremities: No pitting edema, distal pulses 2+. Skin: Warm and dry. Musculoskeletal: No kyphosis. Neuropsychiatric: Alert and oriented x3, affect grossly appropriate.  ECG:  EKG 04/23/2021 sinus bradycardia rate of 50 cannot rule out anterior infarct age undetermined.  Recent Labwork: 01/29/2021: ALT 13; AST 21 04/25/2021: BUN 13; Creatinine, Ser 0.77; Hemoglobin 11.4; Platelets  335; Potassium 4.6; Sodium 136     Component Value Date/Time   CHOL 214 (H) 01/29/2021 0914   TRIG 108 01/29/2021 0914   HDL 57 01/29/2021 0914   CHOLHDL 4.1 Ratio 09/29/2009 2037   VLDL 37 09/29/2009 2037   LDLCALC 138 (H) 01/29/2021 0914    Other Studies Reviewed Today:   Echo 04/24/21  1. Left ventricular ejection fraction, by estimation, is 65 to 70%. The  left ventricle has normal function. The left ventricle has no regional  wall motion abnormalities. Left ventricular diastolic parameters were  normal.   2. Right ventricular systolic function is normal. The right ventricular  size is mildly enlarged. Tricuspid regurgitation signal is inadequate for  assessing PA pressure.   3. The mitral valve is grossly normal. Trivial mitral valve  regurgitation.   4. The aortic valve is tricuspid. Aortic valve regurgitation is not  visualized. Aortic valve mean gradient measures 4.0 mmHg.   5. The inferior vena cava is normal in size with greater than 50%  respiratory variability, suggesting right atrial pressure of 3 mmHg.   LEFT HEART CATH AND CORS/GRAFTS ANGIOGRAPHY      Conclusion     Prox RCA to Mid RCA lesion is 100% stenosed. LIMA graft was visualized by angiography. SVG graft was visualized by angiography. Origin to Prox Graft lesion is 100% stenosed.   1. No obstructive disease in the LAD, Circumflex or intermediate branch. The graft to the intermediate is chronically occluded as the native vessel has no disease. The LIMA to the mid LAD is atretic as the native LAD has no obstructive disease. I suspect that the ruptured plaque noted in the proximal LAD in 2009 may have been SCAD that has now healed. 2. Chronic occlusion proximal RCA. The vein graft to the distal RCA is patent 3. Normal LV filling pressures   Recommendations: No further ischemic workup. Continue medical management of CAD Diagnostic Dominance: Right       Assessment and Plan:  1. CAD in native artery    2. Pain of upper extremity, unspecified laterality   3. Sinus bradycardia   4. Essential hypertension   5. Hyperlipidemia LDL goal <70  1. CAD in native artery Recent cardiac catheterization on 04/24/2021 for anginal symptoms. Showed no obstructive disease in LAD, circumflex or intermediate branch.  Graft to intermediate was chronically occluded.  The LIMA to mid LAD is atretic as the native LAD has no obstructive disease.  Interventionalists suspected ruptured plaque noted in the proximal LAD in 29 may have been a CAD that had now healed.  Chronic occlusion of proximal RCA.  Vein graft to distal RCA was patient.  Recommendations were no further ischemic work-up and continue medical management of CAD.  Continue aspirin 81 mg daily.  Continue sublingual nitroglycerin as needed.  Continue Ranexa 1000 mg p.o. twice daily.  2. Pain of upper extremity, unspecified laterality Complaining of left arm pain status post cardiac catheterization.  States left arm was site of radial access for her recent cardiac catheterization.  She states when removing the catheter she started having significant pain and states the personnel and catheterization suite mentioned she likely had an arterial spasm.  Her pain continues in spite of taking significant analgesics including ibuprofen 800 mg.  Please get a left upper extremity arterial study to assess for possible pseudoaneurysm.  3. Sinus bradycardia Heart rate today 58.  On no AV nodal blockers  4. Essential hypertension Blood pressure well controlled today at 130/70.  Continue amlodipine 10 mg daily.  Continue Lasix 40 mg  5. Hyperlipidemia LDL goal <70 Continue Crestor 20 mg p.o. daily last lipid profile 01/29/2021 total cholesterol 214, HDL 57, triglycerides 108, LDL 130  Medication Adjustments/Labs and Tests Ordered: Current medicines are reviewed at length with the patient today.  Concerns regarding medicines are outlined above.   Disposition: Follow-up  with Dr. Wyline Mood or APP 6 months  Signed, Rennis Harding, NP 05/04/2021 9:23 AM    [facility] at [facility] [address] [place], [place], [place] [address] Phone: [phone]; Fax: [phone]

## 2021-05-04 NOTE — Telephone Encounter (Signed)
Pre-cert Verification for the following procedure    NEEDS TO BE DONE TODAY.    DATE:   05/04/2021  LOCATION: [facility]

## 2021-05-04 NOTE — Patient Instructions (Addendum)
Medication Instructions:  Continue all current medications.  Labwork: none  Testing/Procedures: Upper extremity arterial study of left arm.  Office will contact with results via phone or letter.    Follow-Up: 6 months   Any Other Special Instructions Will Be Listed Below (If Applicable).  If you need a refill on your cardiac medications before your next appointment, please call your pharmacy.

## 2021-05-28 ENCOUNTER — Ambulatory Visit: Payer: BC Managed Care – PPO | Admitting: Family Medicine

## 2021-06-10 ENCOUNTER — Other Ambulatory Visit: Payer: Self-pay | Admitting: Adult Health

## 2021-06-10 DIAGNOSIS — F411 Generalized anxiety disorder: Secondary | ICD-10-CM

## 2021-06-11 NOTE — Telephone Encounter (Signed)
Please schedule appt

## 2021-06-12 NOTE — Telephone Encounter (Signed)
Left message for pt to call and schedule

## 2021-06-16 ENCOUNTER — Telehealth: Payer: Self-pay | Admitting: *Deleted

## 2021-06-16 NOTE — Telephone Encounter (Signed)
-----   Message -----  From: Antoine Poche, MD  Sent: 05/18/2021  10:00 AM EDT  To: Netta Neat., NP   Refer her to vascular please   Dominga Ferry MD

## 2021-06-16 NOTE — Telephone Encounter (Signed)
Netta Neat., NP sent to Lesle Chris, LPN Cc: Antoine Poche, MD   Dondra Spry will you please referred her to vascular per Dr. Verna Czech instructions below.  Thank you

## 2021-06-16 NOTE — Telephone Encounter (Signed)
Lesle Chris, LPN  5/40/0867  6:14 PM EDT Back to Top    Notified, copy to pcp.  Stated that she is doing better.  Arm & wrist are no longer hurting.  Does have some residual pain left in her fingers on that hand, feels tingly and painful at times.  Patient stated that she is not able to do the vascular appointment right now due to finances.  She will call back if her situation changes or symptoms worsen.     Lesle Chris, LPN  03/25/9508  3:26 PM EDT     Left message to return call.

## 2021-06-16 NOTE — Telephone Encounter (Signed)
Netta Neat., NP  05/06/2021  6:05 PM EDT     Inocencio Homes, I am sending this preliminary report to Dr Wyline Mood for further instructions. This is a preliminary result so don't call the patient until I receive word from him regarding next steps. Then we can call the patient.  Dr.Branch she had cardiac catheterization on 04/24/2021. She states when the catheter was removed she had excruciating pain in her left arm which was the site of catheterization. She has been experiencing significant pain since that time in left arm. I had Mandy do LUE study today at 0130. Will await furtherinstructions from you. Thanks   Netta Neat, NP  05/06/2021 6:04 PM

## 2021-07-31 ENCOUNTER — Other Ambulatory Visit: Payer: Self-pay | Admitting: Nurse Practitioner

## 2021-07-31 DIAGNOSIS — F5102 Adjustment insomnia: Secondary | ICD-10-CM

## 2021-08-06 ENCOUNTER — Ambulatory Visit (INDEPENDENT_AMBULATORY_CARE_PROVIDER_SITE_OTHER): Payer: BC Managed Care – PPO | Admitting: Adult Health

## 2021-08-06 ENCOUNTER — Other Ambulatory Visit: Payer: Self-pay

## 2021-08-06 ENCOUNTER — Encounter: Payer: Self-pay | Admitting: Adult Health

## 2021-08-06 DIAGNOSIS — F411 Generalized anxiety disorder: Secondary | ICD-10-CM | POA: Diagnosis not present

## 2021-08-06 DIAGNOSIS — F5102 Adjustment insomnia: Secondary | ICD-10-CM | POA: Diagnosis not present

## 2021-08-06 DIAGNOSIS — F331 Major depressive disorder, recurrent, moderate: Secondary | ICD-10-CM

## 2021-08-06 DIAGNOSIS — G4700 Insomnia, unspecified: Secondary | ICD-10-CM

## 2021-08-06 MED ORDER — FLUOXETINE HCL 40 MG PO CAPS: 40.0000 mg | ORAL_CAPSULE | Freq: Two times a day (BID) | ORAL | 5 refills | Status: DC

## 2021-08-06 MED ORDER — ALPRAZOLAM 0.5 MG PO TABS: 0.5000 mg | ORAL_TABLET | Freq: Three times a day (TID) | ORAL | 2 refills | Status: DC | PRN

## 2021-08-06 MED ORDER — ZOLPIDEM TARTRATE 10 MG PO TABS
ORAL_TABLET | ORAL | 2 refills | Status: DC
Start: 2021-08-06 — End: 2022-02-10

## 2021-08-06 MED ORDER — ZOLPIDEM TARTRATE 10 MG PO TABS
ORAL_TABLET | ORAL | 2 refills | Status: DC
Start: 2021-08-06 — End: 2021-08-06

## 2021-08-06 NOTE — Progress Notes (Signed)
DAILYNN NANCARROW [id number] Aug 15, 1966 54 y.o.  Subjective:   Patient ID:  [name] is a 55 y.o. (DOB 09-06-1966) female.  Chief Complaint: No chief complaint on file.   HPI [name] presents to the office today for follow-up of Bipolar 1 disorder, ADHD, MDD, GAD, PTSD, and insomnia.  Describes mood today as "ok". Pleasant. Mood symptoms - reports decreased depression, anxiety - "at times", and irritability. Stating "I'm maintaining". 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 Feels like medications continue to work well.  Improved interest and motivation. Feels like current medications are working well for her. Taking medications as prescribed.  Energy levels "low". Active, does not have a regular exercise routine.  Enjoys some usual interests and activities. Married. Lives with husband and 2 dogs. Has 2 grown children. Spending time with family. Appetite adequate. Weight loss - 140 pounds. Sleeping better some nights than others - works a swing shift schedule - moving to a daytime schedule temporarily. 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 Focus and concentration - "could be better". Completing tasks. Managing aspects of household. Working 3 - 12 hour shifts.  Denies SI or HI.  Denies AH or VH.   Past trials of medication: Sertraline, Paxil, Fluoxetine, Lexapro, Wellbutrin, Abilify, Buspar   PHQ2-9    Flowsheet Row Office Visit from 03/31/2021 in [facility] Office Visit from 12/18/2020 in [facility]  PHQ-2 Total Score 0 6  PHQ-9 Total Score -- 11      Flowsheet Row ED to Hosp-Admission (Discharged) from 04/23/2021 in [facility] ED from 12/01/2020 in [facility] EMERGENCY DEPARTMENT  C-SSRS RISK CATEGORY No Risk Error: Question 6 not populated        Review of Systems:  Review of Systems  Musculoskeletal:  Negative for gait problem.  Neurological:  Negative for tremors.   Psychiatric/Behavioral:         Please refer to HPI   Medications: I have reviewed the patient's current medications.  Current Outpatient Medications  Medication Sig Dispense Refill   Albuterol Sulfate (PROAIR RESPICLICK) 108 (90 Base) MCG/ACT AEPB Inhale 2 puffs into the lungs every 6 (six) hours as needed. 1 each 5   ALPRAZolam (XANAX) 0.5 MG tablet Take 1 tablet (0.5 mg total) by mouth 3 (three) times daily as needed. 90 tablet 2   amLODipine (NORVASC) 10 MG tablet Take 1 tablet (10 mg total) by mouth daily. 90 tablet 3   aspirin EC 81 MG tablet Take 81 mg by mouth daily.     budesonide-formoterol (SYMBICORT) 160-4.5 MCG/ACT inhaler Inhale 2 puffs into the lungs 2 (two) times daily.     Cholecalciferol (VITAMIN D3 PO) Take 1 tablet by mouth daily.     cyclobenzaprine (FLEXERIL) 10 MG tablet Take 10 mg by mouth 2 (two) times daily as needed for muscle spasms.      diphenoxylate-atropine (LOMOTIL) 2.5-0.025 MG tablet Take 2.5 mg by mouth daily as needed.      esomeprazole (NEXIUM) 40 MG capsule Take 40 mg by mouth daily.     FLUoxetine (PROZAC) 40 MG capsule Take 40 mg by mouth 2 (two) times daily.     furosemide (LASIX) 40 MG tablet Take 1 tablet (40 mg total) by mouth daily. 30 tablet 0   gabapentin (NEURONTIN) 300 MG capsule Take 1 capsule by mouth 4 (four) times daily.     ibuprofen (ADVIL) 800 MG tablet  Take 800 mg by mouth every 8 (eight) hours as needed.     Multiple Vitamins-Minerals (CENTRUM SILVER PO) Take 1 tablet by mouth daily.     nitroGLYCERIN (NITROSTAT) 0.4 MG SL tablet Place 1 tablet (0.4 mg total) under the tongue every 5 (five) minutes x 3 doses as needed for chest pain (if no relief after 3rd dose, proceed to the ED for an evaluation). 25 tablet 3   ondansetron (ZOFRAN) 4 MG tablet Take 4 mg by mouth every 8 (eight) hours as needed.     potassium chloride (KLOR-CON) 20 MEQ packet Take 20 mEq by mouth 2 (two) times daily. 60 tablet 6   ranolazine (RANEXA) 1000 MG SR  tablet Take 1 tablet (1,000 mg total) by mouth 2 (two) times daily. 180 tablet 3   rOPINIRole (REQUIP) 1 MG tablet Take 1 tablet (1 mg total) by mouth at bedtime as needed (for restless leg). 30 tablet 2   rosuvastatin (CRESTOR) 20 MG tablet Take 1 tablet (20 mg total) by mouth daily. 30 tablet 6   vitamin B-12 (CYANOCOBALAMIN) 100 MCG tablet Take 100 mcg by mouth daily.     zolpidem (AMBIEN) 10 MG tablet TAKE 1 TABLET BY MOUTH EVERY EVENING AT BEDTIME 90 tablet 0   No current facility-administered medications for this visit.    Medication Side Effects: None  Allergies:  Allergies  Allergen Reactions   Other    Imdur [Isosorbide Nitrate] Other (See Comments)    Headaches; Worsening Chest Pain    Past Medical History:  Diagnosis Date   ADD (attention deficit disorder with hyperactivity)    Anxiety    Bipolar 1 disorder (HCC)    Carotid artery disease (HCC)    Right 50-69% stenosis by doppler 2010   Chronic diastolic (congestive) heart failure (HCC)    COPD (chronic obstructive pulmonary disease) (HCC)    Phreesia 12/15/2020   Coronary artery disease    a. s/p CABG in 2009 with LIMA-LAD, SVG-RI, and SVG-RCA b. low-risk NST in 10/2015 c. low-risk NST in 12/2018.   CVA (cerebral infarction) 12/2008   Reports a neurologist told her she had a blood clot in her brain   Depression    Depression    Phreesia 12/15/2020   Emphysema of lung (HCC) 06/19/2018   Fibromyalgia    GERD (gastroesophageal reflux disease)    Hyperlipidemia    Hypertension    Myocardial infarction (HCC) 09/18/2011   Phreesia 12/15/2020   OSA on CPAP 12/31/2018   Sleep apnea    Phreesia 12/15/2020   Stroke [facility])    Phreesia 12/15/2020   Tobacco abuse     Past Medical History, Surgical history, Social history, and Family history were reviewed and updated as appropriate.   Please see review of systems for further details on the patient's review from today.   Objective:   Physical Exam:  LMP 10/22/2015    Physical Exam Constitutional:      General: She is not in acute distress. Musculoskeletal:        General: No deformity.  Neurological:     Mental Status: She is alert and oriented to person, place, and time.     Coordination: Coordination normal.  Psychiatric:        Attention and Perception: Attention and perception normal. She does not perceive auditory or visual hallucinations.        Mood and Affect: Mood normal. Mood is not anxious or depressed. Affect is not labile, blunt, angry or inappropriate.  Speech: Speech normal.        Behavior: Behavior normal.        Thought Content: Thought content normal. Thought content is not paranoid or delusional. Thought content does not include homicidal or suicidal ideation. Thought content does not include homicidal or suicidal plan.        Cognition and Memory: Cognition and memory normal.        Judgment: Judgment normal.     Comments: Insight intact    Lab Review:     Component Value Date/Time   NA 136 04/25/2021 0143   NA 139 01/29/2021 0914   K 4.6 04/25/2021 0143   CL 104 04/25/2021 0143   CO2 23 04/25/2021 0143   GLUCOSE 117 (H) 04/25/2021 0143   BUN 13 04/25/2021 0143   BUN 14 01/29/2021 0914   CREATININE 0.77 04/25/2021 0143   CALCIUM 9.3 04/25/2021 0143   PROT 7.2 01/29/2021 0914   ALBUMIN 4.3 01/29/2021 0914   AST 21 01/29/2021 0914   ALT 13 01/29/2021 0914   ALKPHOS 106 01/29/2021 0914   BILITOT 0.3 01/29/2021 0914   GFRNONAA >60 04/25/2021 0143   GFRAA >60 10/07/2019 1040       Component Value Date/Time   WBC 6.0 04/25/2021 0143   RBC 4.02 04/25/2021 0143   HGB 11.4 (L) 04/25/2021 0143   HGB 11.0 (L) 01/29/2021 0914   HCT 36.2 04/25/2021 0143   HCT 33.2 (L) 01/29/2021 0914   PLT 335 04/25/2021 0143   PLT 334 01/29/2021 0914   MCV 90.0 04/25/2021 0143   MCV 85 01/29/2021 0914   MCH 28.4 04/25/2021 0143   MCHC 31.5 04/25/2021 0143   RDW 14.3 04/25/2021 0143   RDW 12.9 01/29/2021 0914   LYMPHSABS  3.1 01/29/2021 0914   MONOABS 0.4 12/01/2020 1324   EOSABS 0.1 01/29/2021 0914   BASOSABS 0.0 01/29/2021 0914    No results found for: POCLITH, LITHIUM   No results found for: PHENYTOIN, PHENOBARB, VALPROATE, CBMZ   .res Assessment: Plan:    Plan:  PDMP reviewed  Continue Fluoxetine 80 mg daily  Continue Xanax 0.5mg  TID prn anxiety - not taking 3 times a day Ambien 10mg  as needed  Has seen a therapist in the past  RTC 6 months  Patient advised to contact office with any questions, adverse effects, or acute worsening in signs and symptoms.  Discussed potential benefits, risk, and side effects of benzodiazepines to include potential risk of tolerance and dependence, as well as possible drowsiness.  Advised patient not to drive if experiencing drowsiness and to take lowest possible effective dose to minimize risk of dependence and tolerance.  Discussed potential metabolic side effects associated with atypical antipsychotics, as well as potential risk for movement side effects. Advised pt to contact office if movement side effects occur.   There are no diagnoses linked to this encounter.   Please see After Visit Summary for patient specific instructions.  No future appointments.  No orders of the defined types were placed in this encounter.   -------------------------------

## 2021-10-27 ENCOUNTER — Telehealth: Payer: Self-pay

## 2021-10-27 DIAGNOSIS — Z0000 Encounter for general adult medical examination without abnormal findings: Secondary | ICD-10-CM

## 2021-10-27 NOTE — Telephone Encounter (Signed)
Patient called needs blood work order for CPE appt for 1/31 with [facility].  There is an order out there from June 2022.

## 2021-10-28 NOTE — Addendum Note (Signed)
Addended by: Claiborne Rigg D on: 10/28/2021 08:16 AM   Modules accepted: Orders

## 2021-10-28 NOTE — Telephone Encounter (Signed)
Lab orders have been placed

## 2021-11-09 ENCOUNTER — Encounter: Payer: Self-pay | Admitting: Nurse Practitioner

## 2021-11-09 ENCOUNTER — Other Ambulatory Visit: Payer: Self-pay

## 2021-11-09 ENCOUNTER — Ambulatory Visit (INDEPENDENT_AMBULATORY_CARE_PROVIDER_SITE_OTHER): Payer: BC Managed Care – PPO | Admitting: Nurse Practitioner

## 2021-11-09 ENCOUNTER — Ambulatory Visit: Payer: BC Managed Care – PPO

## 2021-11-09 DIAGNOSIS — J069 Acute upper respiratory infection, unspecified: Secondary | ICD-10-CM

## 2021-11-09 DIAGNOSIS — J329 Chronic sinusitis, unspecified: Secondary | ICD-10-CM

## 2021-11-09 HISTORY — DX: Acute upper respiratory infection, unspecified: J06.9

## 2021-11-09 LAB — POCT RAPID STREP A (OFFICE): Rapid Strep A Screen: NEGATIVE

## 2021-11-09 LAB — POCT INFLUENZA A/B
Influenza A, POC: NEGATIVE
Influenza B, POC: NEGATIVE

## 2021-11-09 MED ORDER — BENZONATATE 100 MG PO CAPS: 100.0000 mg | ORAL_CAPSULE | Freq: Two times a day (BID) | ORAL | 0 refills | Status: DC | PRN

## 2021-11-09 MED ORDER — FLUTICASONE PROPIONATE 50 MCG/ACT NA SUSP: 2.0000 | Freq: Every day | NASAL | 6 refills | Status: DC

## 2021-11-09 MED ORDER — CETIRIZINE HCL 10 MG PO TABS: 10.0000 mg | ORAL_TABLET | Freq: Every day | ORAL | 11 refills | Status: DC

## 2021-11-09 NOTE — Progress Notes (Signed)
Virtual Visit via Telephone Note  I connected with [name] @ on 11/09/21 at 1049am by telephone and verified that I am speaking with the correct person using two identifiers. I spent 9 minutes talking to pt and reviewing her chart.  Location: Patient: home Provider: office   I discussed the limitations, risks, security and privacy concerns of performing an evaluation and management service by telephone and the availability of in person appointments. I also discussed with the patient that there may be a patient responsible charge related to this service. The patient expressed understanding and agreed to proceed.   History of Present Illness: Pt c/o cough, sometimes productive, yellowish white sputum , head congestion, sore throat and body aches , low grade fever 99, HA, sneezing, sinus pressure.  that started on 1/12 ,home covid test at home today was negative. She has been taking NYQUIL. Has not had flu vaccine, had 2 covid vaccines, did not get the most recent covid booster, has chest tightness. SOB and wheezing is about the same but its hard to breath. Has COPD, has been taking dulera, Symbicort, albuterol   Observations/Objective:   Assessment and Plan: Sinusitis: Start zyrtec 10mg  daily'                 Flonase nasal spray                  OTC Tylenol as needed for HA, fever URI. Obtain COVID and flu test           Drink plenty of water to stay hydrated,           Tesaalon 100mg  BID PRN         Pt advised to get flus vaccine and covid booster once she feels better.  Follow Up Instructions:    I discussed the assessment and treatment plan with the patient. The patient was provided an opportunity to ask questions and all were answered. The patient agreed with the plan and demonstrated an understanding of the instructions.   The patient was advised to call back or seek an in-person evaluation if the symptoms worsen or if the condition fails to improve as anticipated.

## 2021-11-09 NOTE — Assessment & Plan Note (Signed)
Start zyrtec 10mg  daily'                 Flonase nasal spray                  OTC Tylenol as needed for HA, fever

## 2021-11-09 NOTE — Addendum Note (Signed)
Addended by: Claiborne Rigg D on: 11/09/2021 11:36 AM   Modules accepted: Orders

## 2021-11-09 NOTE — Assessment & Plan Note (Signed)
°  URI. Obtain COVID and flu test           Drink plenty of water to stay hydrated,           Tesaalon 100mg  BID PRN TC tylenol as needed for fever, HA          Pt advised to get flus vaccine and covid booster once she feels better.

## 2021-11-10 ENCOUNTER — Other Ambulatory Visit: Payer: Self-pay | Admitting: Nurse Practitioner

## 2021-11-10 ENCOUNTER — Telehealth: Payer: Self-pay | Admitting: Nurse Practitioner

## 2021-11-10 DIAGNOSIS — J329 Chronic sinusitis, unspecified: Secondary | ICD-10-CM

## 2021-11-10 MED ORDER — AZITHROMYCIN 250 MG PO TABS: ORAL_TABLET | ORAL | 0 refills | Status: AC

## 2021-11-10 NOTE — Telephone Encounter (Signed)
Patient aware via mychart message 

## 2021-11-10 NOTE — Telephone Encounter (Signed)
Pt is calling still feels bad and has to go to work at 5:30. Is the covid results back, should she stay out of work

## 2021-11-10 NOTE — Telephone Encounter (Signed)
I have sent in a prescription for zpack to her pharmacy, covid test is not back yet. COVID testis notback yet. Thanks

## 2021-11-11 LAB — NOVEL CORONAVIRUS, NAA: SARS-CoV-2, NAA: NOT DETECTED

## 2021-11-11 LAB — SARS-COV-2, NAA 2 DAY TAT

## 2021-11-11 NOTE — Progress Notes (Signed)
COVID test was negative. I sent in antibiotics to patient's pharmacy yesterday.

## 2021-11-13 ENCOUNTER — Other Ambulatory Visit: Payer: Self-pay | Admitting: Adult Health

## 2021-11-13 DIAGNOSIS — F411 Generalized anxiety disorder: Secondary | ICD-10-CM

## 2021-11-21 ENCOUNTER — Other Ambulatory Visit: Payer: Self-pay | Admitting: Nurse Practitioner

## 2021-11-21 LAB — LIPID PANEL
Chol/HDL Ratio: 4.7 ratio — ABNORMAL HIGH (ref 0.0–4.4)
Cholesterol, Total: 235 mg/dL — ABNORMAL HIGH (ref 100–199)
HDL: 50 mg/dL (ref >39)
LDL Chol Calc (NIH): 145 mg/dL — ABNORMAL HIGH (ref 0–99)
Triglycerides: 221 mg/dL — ABNORMAL HIGH (ref 0–149)
VLDL Cholesterol Cal: 40 mg/dL (ref 5–40)

## 2021-11-21 LAB — CBC WITH DIFFERENTIAL/PLATELET
Basophils Absolute: 0.1 10*3/uL (ref 0.0–0.2)
Basos: 1 %
EOS (ABSOLUTE): 0.2 10*3/uL (ref 0.0–0.4)
Eos: 2 %
Hematocrit: 35.6 % (ref 34.0–46.6)
Hemoglobin: 11.7 g/dL (ref 11.1–15.9)
Immature Grans (Abs): 0 10*3/uL (ref 0.0–0.1)
Immature Granulocytes: 0 %
Lymphocytes Absolute: 3.8 10*3/uL — ABNORMAL HIGH (ref 0.7–3.1)
Lymphs: 55 %
MCH: 28.8 pg (ref 26.6–33.0)
MCHC: 32.9 g/dL (ref 31.5–35.7)
MCV: 88 fL (ref 79–97)
Monocytes Absolute: 0.3 10*3/uL (ref 0.1–0.9)
Monocytes: 4 %
Neutrophils Absolute: 2.7 10*3/uL (ref 1.4–7.0)
Neutrophils: 38 %
Platelets: 391 10*3/uL (ref 150–450)
RBC: 4.06 x10E6/uL (ref 3.77–5.28)
RDW: 13.2 % (ref 11.7–15.4)
WBC: 7 10*3/uL (ref 3.4–10.8)

## 2021-11-21 LAB — CMP14+EGFR
ALT: 12 IU/L (ref 0–32)
AST: 16 IU/L (ref 0–40)
Albumin/Globulin Ratio: 1.7 (ref 1.2–2.2)
Albumin: 4.4 g/dL (ref 3.8–4.9)
Alkaline Phosphatase: 100 IU/L (ref 44–121)
BUN/Creatinine Ratio: 13 (ref 9–23)
BUN: 10 mg/dL (ref 6–24)
Bilirubin Total: 0.2 mg/dL (ref 0.0–1.2)
CO2: 24 mmol/L (ref 20–29)
Calcium: 9.4 mg/dL (ref 8.7–10.2)
Chloride: 102 mmol/L (ref 96–106)
Creatinine, Ser: 0.75 mg/dL (ref 0.57–1.00)
Globulin, Total: 2.6 g/dL (ref 1.5–4.5)
Glucose: 95 mg/dL (ref 70–99)
Potassium: 4.2 mmol/L (ref 3.5–5.2)
Sodium: 140 mmol/L (ref 134–144)
Total Protein: 7 g/dL (ref 6.0–8.5)
eGFR: 94 mL/min/{1.73_m2} (ref >59)

## 2021-11-21 NOTE — Progress Notes (Signed)
I will follow up with pt about her labs at her next visit on 1/31.

## 2021-11-24 ENCOUNTER — Other Ambulatory Visit: Payer: Self-pay

## 2021-11-24 ENCOUNTER — Encounter: Payer: Self-pay | Admitting: Nurse Practitioner

## 2021-11-24 ENCOUNTER — Ambulatory Visit (INDEPENDENT_AMBULATORY_CARE_PROVIDER_SITE_OTHER): Payer: BC Managed Care – PPO | Admitting: Nurse Practitioner

## 2021-11-24 VITALS — BP 138/62 | HR 68 | Ht 62.0 in | Wt 142.1 lb

## 2021-11-24 DIAGNOSIS — Z1231 Encounter for screening mammogram for malignant neoplasm of breast: Secondary | ICD-10-CM | POA: Diagnosis not present

## 2021-11-24 DIAGNOSIS — R42 Dizziness and giddiness: Secondary | ICD-10-CM | POA: Insufficient documentation

## 2021-11-24 DIAGNOSIS — Z23 Encounter for immunization: Secondary | ICD-10-CM | POA: Diagnosis not present

## 2021-11-24 DIAGNOSIS — I10 Essential (primary) hypertension: Secondary | ICD-10-CM | POA: Diagnosis not present

## 2021-11-24 DIAGNOSIS — Z1211 Encounter for screening for malignant neoplasm of colon: Secondary | ICD-10-CM

## 2021-11-24 DIAGNOSIS — J019 Acute sinusitis, unspecified: Secondary | ICD-10-CM

## 2021-11-24 DIAGNOSIS — Z0001 Encounter for general adult medical examination with abnormal findings: Secondary | ICD-10-CM

## 2021-11-24 DIAGNOSIS — E782 Mixed hyperlipidemia: Secondary | ICD-10-CM | POA: Insufficient documentation

## 2021-11-24 DIAGNOSIS — D72820 Lymphocytosis (symptomatic): Secondary | ICD-10-CM | POA: Insufficient documentation

## 2021-11-24 DIAGNOSIS — E785 Hyperlipidemia, unspecified: Secondary | ICD-10-CM

## 2021-11-24 DIAGNOSIS — Z0000 Encounter for general adult medical examination without abnormal findings: Secondary | ICD-10-CM | POA: Insufficient documentation

## 2021-11-24 DIAGNOSIS — J449 Chronic obstructive pulmonary disease, unspecified: Secondary | ICD-10-CM | POA: Insufficient documentation

## 2021-11-24 DIAGNOSIS — J439 Emphysema, unspecified: Secondary | ICD-10-CM | POA: Insufficient documentation

## 2021-11-24 MED ORDER — ROSUVASTATIN CALCIUM 40 MG PO TABS: 40.0000 mg | ORAL_TABLET | Freq: Every day | ORAL | 3 refills | Status: DC

## 2021-11-24 NOTE — Assessment & Plan Note (Signed)
Recently treated with zpack. Pt told to take cetrizine 10 mg daily and use her flonase daily.

## 2021-11-24 NOTE — Assessment & Plan Note (Signed)
Pt recently treated for sinus infection, denies fever, chills , night sweat, recheck CBC at next visit.

## 2021-11-24 NOTE — Assessment & Plan Note (Addendum)
Pt c/o of always been SOB. Takes albuterol PRN SYMBICORT makes her heart race so she does not use meds often  She requested  For referral to a new pulmonologist today.

## 2021-11-24 NOTE — Assessment & Plan Note (Signed)
Annual exam as documented.  ?Counseling done include healthy lifestyle involving committing to 150 minutes of exercise per week, heart healthy diet, and attaining healthy weight. The importance of adequate sleep also discussed.  ?Regular use of seat belt and home safety were also discussed . ?Changes in health habits are decided on by patient with goals and time frames set for achieving them. ?Immunization and cancer screening  needs are specifically addressed at this visit.   ?

## 2021-11-24 NOTE — Progress Notes (Signed)
Established Patient Office Visit  Subjective:  Patient ID: [name], female    DOB: [birth year]  Age: 56 y.o. MRN: [id number]  CC:  Chief Complaint  Patient presents with   Annual Exam    Cpe dizziness 3 times a day since around 10/27/21    HPI [name] presents for annual physical exam. Pt elected for cologuard this year because when they attempted to do colonoscopy on her 3 years ago, her blood pressure dropped and they canceled the procedure, she had a negative Cologuard on 09/27/18.  Pt refused PAP smear today, she will reschedule it  She has had 2 covid vaccine and 2 boosters.   Pt c/o dizziness that started 3 weeks ago, states that dizziness occurs when she is sitting , denies HA, palpitations, chest pain, syncope, feelings of passing out.    Past Medical History:  Diagnosis Date   ADD (attention deficit disorder with hyperactivity)    Anxiety    Bipolar 1 disorder ([place])    Carotid artery disease ([place])    Right 50-69% stenosis by doppler 2010   Chronic diastolic (congestive) heart failure (H[facility])    COPD (chronic obstructive pulmonary disease) ([place])    Phreesia 12/15/2020   Coronary artery disease    a. s/p CABG in 2009 with LIMA-LAD, SVG-RI, and SVG-RCA b. low-risk NST in 10/2015 c. low-risk NST in 12/2018.   CVA (cerebral infarction) 12/2008   Reports a neurologist told her she had a blood clot in her brain   Depression    Depression    Phreesia 12/15/2020   Emphysema of lung ([place]) 06/19/2018   Fibromyalgia    GERD (gastroesophageal reflux disease)    Hyperlipidemia    Hypertension    Myocardial infarction ([place]) 09/18/2011   Phreesia 12/15/2020   OSA on CPAP 12/31/2018   Sleep apnea    Phreesia 12/15/2020   Stroke ([place])    Phreesia 12/15/2020   Tobacco abuse     Past Surgical History:  Procedure Laterality Date   CARDIAC CATHETERIZATION     CORONARY ARTERY BYPASS GRAFT  2009   3V CABG LIMA --> LAD, SVG --> [place] Intermediate,  SVG --> RCA    LEFT HEART CATH AND CORS/GRAFTS ANGIOGRAPHY N/A 04/24/2021   Procedure: LEFT HEART CATH AND CORS/GRAFTS ANGIOGRAPHY;  Surgeon: Burnell Blanks, MD;  Location: [facility];  Service: Cardiovascular;  Laterality: N/A;   TUBAL LIGATION  1990    Family History  Problem Relation Age of Onset   CAD Mother    Intellectual disability Maternal Aunt    Intellectual disability Maternal Uncle    Alcohol abuse Paternal Grandfather     Social History   Socioeconomic History   Marital status: Married    Spouse name: Not on file   Number of children: Not on file   Years of education: Not on file   Highest education level: Not on file  Occupational History   Occupation: [employer]    Comment: Dentist- in Psychologist, educational  Tobacco Use   Smoking status: Former    Packs/day: 0.50    Years: 35.00    Pack years: 17.50    Types: Cigarettes    Start date: 12/14/1981    Quit date: 12/17/2018    Years since quitting: 2.9   Smokeless tobacco: Never  Vaping Use   Vaping Use: Every day   Substances: Nicotine, Flavoring  Substance and Sexual Activity   Alcohol use: Not Currently  Comment: rarely- had a margarita on birthday   Drug use: No   Sexual activity: Yes    Birth control/protection: Post-menopausal  Other Topics Concern   Not on file  Social History Narrative   Married with 2 children who are grown; has pets   Social Determinants of Radio broadcast assistant Strain: Not on file  Food Insecurity: Not on file  Transportation Needs: Not on file  Physical Activity: Not on file  Stress: Not on file  Social Connections: Not on file  Intimate Partner Violence: Not on file    Outpatient Medications Prior to Visit  Medication Sig Dispense Refill   Albuterol Sulfate (PROAIR RESPICLICK) 616 (90 Base) MCG/ACT AEPB Inhale 2 puffs into the lungs every 6 (six) hours as needed. 1 each 5   ALPRAZolam (XANAX) 0.5 MG tablet Take 1 tablet (0.5 mg total) by mouth 3  (three) times daily as needed. 90 tablet 2   amLODipine (NORVASC) 10 MG tablet Take 1 tablet (10 mg total) by mouth daily. 90 tablet 3   aspirin EC 81 MG tablet Take 81 mg by mouth daily.     benzonatate (TESSALON) 100 MG capsule Take 1 capsule (100 mg total) by mouth 2 (two) times daily as needed for cough. 20 capsule 0   budesonide-formoterol (SYMBICORT) 160-4.5 MCG/ACT inhaler Inhale 2 puffs into the lungs 2 (two) times daily.     cetirizine (ZYRTEC) 10 MG tablet Take 1 tablet (10 mg total) by mouth daily. 30 tablet 11   Cholecalciferol (VITAMIN D3 PO) Take 1 tablet by mouth daily.     cyclobenzaprine (FLEXERIL) 10 MG tablet Take 10 mg by mouth 2 (two) times daily as needed for muscle spasms.      diphenoxylate-atropine (LOMOTIL) 2.5-0.025 MG tablet Take 2.5 mg by mouth daily as needed.      esomeprazole (NEXIUM) 40 MG capsule Take 40 mg by mouth daily.     FLUoxetine (PROZAC) 40 MG capsule Take 1 capsule (40 mg total) by mouth 2 (two) times daily. 60 capsule 5   fluticasone (FLONASE) 50 MCG/ACT nasal spray Place 2 sprays into both nostrils daily. 16 g 6   furosemide (LASIX) 40 MG tablet Take 1 tablet (40 mg total) by mouth daily. 30 tablet 0   gabapentin (NEURONTIN) 300 MG capsule Take 1 capsule by mouth 4 (four) times daily.     ibuprofen (ADVIL) 800 MG tablet Take 800 mg by mouth every 8 (eight) hours as needed.     mometasone-formoterol (DULERA) 100-5 MCG/ACT AERO Inhale 2 puffs into the lungs 2 (two) times daily.     Multiple Vitamins-Minerals (CENTRUM SILVER PO) Take 1 tablet by mouth daily.     nitroGLYCERIN (NITROSTAT) 0.4 MG SL tablet Place 1 tablet (0.4 mg total) under the tongue every 5 (five) minutes x 3 doses as needed for chest pain (if no relief after 3rd dose, proceed to the ED for an evaluation). 25 tablet 3   ondansetron (ZOFRAN) 4 MG tablet Take 4 mg by mouth every 8 (eight) hours as needed.     potassium chloride (KLOR-CON) 20 MEQ packet Take 20 mEq by mouth 2 (two) times  daily. 60 tablet 6   ranolazine (RANEXA) 1000 MG SR tablet Take 1 tablet (1,000 mg total) by mouth 2 (two) times daily. 180 tablet 3   rOPINIRole (REQUIP) 1 MG tablet Take 1 tablet (1 mg total) by mouth at bedtime as needed (for restless leg). 30 tablet 2   rosuvastatin (CRESTOR) 20  MG tablet Take 1 tablet (20 mg total) by mouth daily. 30 tablet 6   vitamin B-12 (CYANOCOBALAMIN) 100 MCG tablet Take 100 mcg by mouth daily.     zolpidem (AMBIEN) 10 MG tablet TAKE 1 TABLET BY MOUTH EVERY EVENING AT BEDTIME 30 tablet 2   No facility-administered medications prior to visit.    Allergies  Allergen Reactions   Other    Imdur [Isosorbide Nitrate] Other (See Comments)    Headaches; Worsening Chest Pain    ROS Review of Systems  Constitutional: Negative.   HENT:  Positive for congestion and sinus pressure. Negative for ear discharge, ear pain, hearing loss and mouth sores.   Eyes: Negative.   Respiratory:  Positive for cough and shortness of breath. Negative for apnea, choking, chest tightness, wheezing and stridor.   Cardiovascular: Negative.   Gastrointestinal: Negative.   Endocrine: Negative.   Genitourinary: Negative.   Musculoskeletal: Negative.   Allergic/Immunologic: Negative.   Neurological:  Positive for dizziness.  Hematological: Negative.   Psychiatric/Behavioral: Negative.       Objective:   Pt deferred PAP and breast exam today.  Physical Exam Constitutional:      General: She is not in acute distress.    Appearance: She is not ill-appearing, toxic-appearing or diaphoretic.  HENT:     Head: Normocephalic and atraumatic.     Right Ear: Tympanic membrane, ear canal and external ear normal. There is no impacted cerumen.     Left Ear: Tympanic membrane, ear canal and external ear normal. There is no impacted cerumen.     Nose: No congestion or rhinorrhea.     Mouth/Throat:     Mouth: Mucous membranes are moist.     Pharynx: Oropharynx is clear. No oropharyngeal exudate  or posterior oropharyngeal erythema.  Eyes:     General: No scleral icterus.       Right eye: No discharge.        Left eye: No discharge.     Conjunctiva/sclera: Conjunctivae normal.  Neck:     Vascular: No carotid bruit.  Cardiovascular:     Rate and Rhythm: Normal rate and regular rhythm.     Pulses: Normal pulses.     Heart sounds: Normal heart sounds. No murmur heard.   No friction rub.  Pulmonary:     Effort: No respiratory distress.     Breath sounds: No stridor. No wheezing, rhonchi or rales.  Chest:     Chest wall: No tenderness.  Abdominal:     General: There is no distension.     Palpations: Abdomen is soft. There is no mass.     Tenderness: There is no abdominal tenderness. There is no right CVA tenderness, guarding or rebound.     Hernia: No hernia is present.  Musculoskeletal:        General: No swelling, tenderness, deformity or signs of injury.     Cervical back: Normal range of motion and neck supple. No rigidity or tenderness.     Right lower leg: No edema.     Left lower leg: No edema.  Lymphadenopathy:     Cervical: No cervical adenopathy.  Skin:    Capillary Refill: Capillary refill takes less than 2 seconds.     Coloration: Skin is not jaundiced or pale.     Findings: No bruising, erythema, lesion or rash.  Neurological:     Mental Status: She is alert.     Cranial Nerves: No cranial nerve deficit.  Sensory: No sensory deficit.     Motor: No weakness.     Coordination: Coordination normal.     Gait: Gait normal.     Deep Tendon Reflexes: Reflexes normal.  Psychiatric:        Mood and Affect: Mood normal.        Behavior: Behavior normal.        Thought Content: Thought content normal.        Judgment: Judgment normal.    BP 138/62 (BP Location: Left Arm, Patient Position: Sitting, Cuff Size: Normal)    Pulse 68    Ht _0  (1.575 m)    Wt 142 lb 1.9 oz (64.5 kg)    LMP 10/22/2015    SpO2 96%    BMI 25.99 kg/m  Wt Readings from Last 3  Encounters:  11/24/21 142 lb 1.9 oz (64.5 kg)  05/04/21 140 lb (63.5 kg)  04/23/21 145 lb (65.8 kg)     Health Maintenance Due  Topic Date Due   COVID-19 Vaccine (1) Never done   TETANUS/TDAP  Never done   PAP SMEAR-Modifier  Never done   COLONOSCOPY (Pts 45-15yr Insurance coverage will need to be confirmed)  Never done   MAMMOGRAM  Never done   Zoster Vaccines- Shingrix (1 of 2) Never done    There are no preventive care reminders to display for this patient.  Lab Results  Component Value Date   TSH 1.865 12/31/2018   Lab Results  Component Value Date   WBC 7.0 11/20/2021   HGB 11.7 11/20/2021   HCT 35.6 11/20/2021   MCV 88 11/20/2021   PLT 391 11/20/2021   Lab Results  Component Value Date   NA 140 11/20/2021   K 4.2 11/20/2021   CO2 24 11/20/2021   GLUCOSE 95 11/20/2021   BUN 10 11/20/2021   CREATININE 0.75 11/20/2021   BILITOT 0.2 11/20/2021   ALKPHOS 100 11/20/2021   AST 16 11/20/2021   ALT 12 11/20/2021   PROT 7.0 11/20/2021   ALBUMIN 4.4 11/20/2021   CALCIUM 9.4 11/20/2021   ANIONGAP 9 04/25/2021   EGFR 94 11/20/2021   Lab Results  Component Value Date   CHOL 235 (H) 11/20/2021   Lab Results  Component Value Date   HDL 50 11/20/2021   Lab Results  Component Value Date   LDLCALC 145 (H) 11/20/2021   Lab Results  Component Value Date   TRIG 221 (H) 11/20/2021   Lab Results  Component Value Date   CHOLHDL 4.7 (H) 11/20/2021   Lab Results  Component Value Date   HGBA1C  10/08/2008    5.8 (NOTE)   The ADA recommends the following therapeutic goal for glycemic   control related to Hgb A1C measurement:   Goal of Therapy:   < 7.0% Hgb A1C   Reference: American Diabetes Association: Clinical Practice   Recommendations 2008, Diabetes Care,  2008, 31:(Suppl 1).      Assessment & Plan:   Problem List Items Addressed This Visit   None   No orders of the defined types were placed in this encounter.   Follow-up: No follow-ups on file.     FRenee Rival FNP

## 2021-11-24 NOTE — Assessment & Plan Note (Signed)
DASH diet and commitment to daily physical activity for a minimum of 30 minutes discussed and encouraged, as a part of hypertension management. The importance of attaining a healthy weight is also discussed.  BP/Weight 11/24/2021 05/04/2021 04/25/2021 04/23/2021 03/31/2021 12/18/2020 12/03/2020  Systolic BP 138 130 104 - 135 295 149  Diastolic BP 62 70 62 - 75 61 56  Wt. (Lbs) 142.12 140 - 145 140 140 -  BMI 25.99 25.61 - 26.52 25.61 25.61 -  Some encounter information is confidential and restricted. Go to Review Flowsheets activity to see all data.  continue current meds.

## 2021-11-24 NOTE — Patient Instructions (Signed)
Please get your shingles vaccine, flu vaccine at the office today. Get your covid booster at your pharmacy.   Please get your labs done 3-5 days before your next visit.    It is important that you exercise regularly at least 30 minutes 5 times a week.  Think about what you will eat, plan ahead. Choose " clean, green, fresh or frozen" over canned, processed or packaged foods which are more sugary, salty and fatty. 70 to 75% of food eaten should be vegetables and fruit. Three meals at set times with snacks allowed between meals, but they must be fruit or vegetables. Aim to eat over a 12 hour period , example 7 am to 7 pm, and STOP after  your last meal of the day. Drink water,generally about 64 ounces per day, no other drink is as healthy. Fruit juice is best enjoyed in a healthy way, by EATING the fruit.  Thanks for choosing [facility], we consider it a privelige to serve you.

## 2021-11-24 NOTE — Assessment & Plan Note (Addendum)
Lab Results  Component Value Date   CHOL 235 (H) 11/20/2021   HDL 50 11/20/2021   LDLCALC 145 (H) 11/20/2021   TRIG 221 (H) 11/20/2021   CHOLHDL 4.7 (H) 11/20/2021  Takes Crestor 20 mg daily LDL not at goal , start Crestor 40mg  daily, follow up in 6 weeks Eat a healthy diet, including lots of fruits and vegetables. Avoid foods with a lot of saturated and trans fats, such as red meat, butter, fried foods and cheese . Maintain a healthy weight.

## 2021-11-24 NOTE — Assessment & Plan Note (Addendum)
Lab Results  Component Value Date   WBC 7.0 11/20/2021   HGB 11.7 11/20/2021   HCT 35.6 11/20/2021   MCV 88 11/20/2021   PLT 391 11/20/2021  pt told to call back to the offic eif her symptoms persist, cut back on using xanax  Vital signs normal in the office today, pt denies palpitations, syncope, takes xanax 0.5mg  TID PRN. Patient told not to get up abruptly when standing up  to prevent falls

## 2021-12-21 ENCOUNTER — Other Ambulatory Visit: Payer: Self-pay | Admitting: Adult Health

## 2021-12-21 DIAGNOSIS — F411 Generalized anxiety disorder: Secondary | ICD-10-CM

## 2022-01-07 ENCOUNTER — Institutional Professional Consult (permissible substitution): Payer: BC Managed Care – PPO | Admitting: Internal Medicine

## 2022-01-11 ENCOUNTER — Ambulatory Visit: Payer: BC Managed Care – PPO | Admitting: Nurse Practitioner

## 2022-01-29 ENCOUNTER — Ambulatory Visit: Payer: BC Managed Care – PPO

## 2022-02-02 ENCOUNTER — Ambulatory Visit: Payer: BC Managed Care – PPO | Admitting: Nurse Practitioner

## 2022-02-04 ENCOUNTER — Ambulatory Visit: Payer: BC Managed Care – PPO | Admitting: Adult Health

## 2022-02-05 ENCOUNTER — Institutional Professional Consult (permissible substitution): Payer: BC Managed Care – PPO | Admitting: Internal Medicine

## 2022-02-08 ENCOUNTER — Ambulatory Visit
Admission: RE | Admit: 2022-02-08 | Discharge: 2022-02-08 | Disposition: A | Discharge status: Home / Self Care | Payer: BC Managed Care – PPO | Source: Ambulatory Visit | Attending: Nurse Practitioner | Admitting: Nurse Practitioner

## 2022-02-09 ENCOUNTER — Other Ambulatory Visit: Payer: Self-pay | Admitting: Adult Health

## 2022-02-09 DIAGNOSIS — G4700 Insomnia, unspecified: Secondary | ICD-10-CM

## 2022-03-02 ENCOUNTER — Encounter: Payer: Self-pay | Admitting: Cardiology

## 2022-03-02 ENCOUNTER — Ambulatory Visit: Payer: BC Managed Care – PPO | Admitting: Cardiology

## 2022-03-02 VITALS — BP 138/74 | HR 62 | Ht 62.0 in | Wt 145.0 lb

## 2022-03-02 DIAGNOSIS — I10 Essential (primary) hypertension: Secondary | ICD-10-CM | POA: Diagnosis not present

## 2022-03-02 DIAGNOSIS — I2510 Atherosclerotic heart disease of native coronary artery without angina pectoris: Secondary | ICD-10-CM | POA: Diagnosis not present

## 2022-03-02 DIAGNOSIS — E7849 Other hyperlipidemia: Secondary | ICD-10-CM | POA: Diagnosis not present

## 2022-03-02 MED ORDER — LOSARTAN POTASSIUM 25 MG PO TABS: 12.5000 mg | ORAL_TABLET | Freq: Every day | ORAL | 3 refills | Status: DC

## 2022-03-02 NOTE — Progress Notes (Signed)
? ? ? ?Clinical Summary ?[name] is a 56 y.o.female seen today for follow up of the following medical problems.  ? ?1. CAD ?- prior CABG in 2009 with LIMA-LAD, SVG-RI, SVG-RCA ?- 12/2018 echo LVEF 60-65%, no significant abnormalities ?- 12/2018 nuclear stress: no ischemia ?-not on beta blockers due to bradycardia. Intolerant to imdur ?  ? ? 04/2021 echo LVEF 65-70%, no WMAs  ?04/2021 cath: patent LAD, LCX. RCA is occluded. Atretic LIMA-LAD, patent SVG-RCA graft. Chronic occluded SVG-OM1 ? ?- no chest pains, chronic SOB with recent allergies. Compliant with inhalers. ? ? ?2. Palpitations ?- few times a day, just a few seconds ?- typically feels when she is resting.  ?- coffee x 2, Dr peppers, tea ?  ?  ?  ?3. Chronic diastolic HF ?- overall controlled ?  ?4. COPD ?- compliant with inhalers ?- upcoming appt with Dr Melvyn Novas ?  ?5. HTN ?- she is compliant with bp meds. Had been on lisinopril at some point in the past but reports ran out ?  ?6. Hyperlipidemia ?- she is on statin ?- Jan 2023 TC 235 TG 221 HDL 50 LDL 145 ?- stopped crestor, was causing joint pains.  ?- reports has tried multiple statins, all caused muscle aches ?  ?7. OSA on cpap ? ?SH: works as Counsellor for [employer] ? ? ?Past Medical History:  ?Diagnosis Date  ? ADD (attention deficit disorder with hyperactivity)   ? Anxiety   ? Bipolar 1 disorder ([place])   ? Carotid artery disease ([place])   ? Right 50-69% stenosis by doppler 2010  ? Chronic diastolic (congestive) heart failure (HCC)   ? COPD (chronic obstructive pulmonary disease) ([place])   ? Phreesia 12/15/2020  ? Coronary artery disease   ? a. s/p CABG in 2009 with LIMA-LAD, SVG-RI, and SVG-RCA b. low-risk NST in 10/2015 c. low-risk NST in 12/2018.  ? CVA (cerebral infarction) 12/2008  ? Reports a neurologist told her she had a blood clot in her brain  ? Depression   ? Depression   ? Phreesia 12/15/2020  ? Emphysema of lung ([place]) 06/19/2018  ? Fibromyalgia   ? GERD (gastroesophageal reflux disease)    ? Hyperlipidemia   ? Hypertension   ? Myocardial infarction ([place]) 09/18/2011  ? Phreesia 12/15/2020  ? OSA on CPAP 12/31/2018  ? Sleep apnea   ? Phreesia 12/15/2020  ? Stroke [facility])   ? Phreesia 12/15/2020  ? Tobacco abuse   ? ? ? ?Allergies  ?Allergen Reactions  ? Other   ? Imdur [Isosorbide Nitrate] Other (See Comments)  ?  Headaches; Worsening Chest Pain  ? ? ? ?Current Outpatient Medications  ?Medication Sig Dispense Refill  ? Albuterol Sulfate (PROAIR RESPICLICK) [id number] (90 Base) MCG/ACT AEPB Inhale 2 puffs into the lungs every 6 (six) hours as needed. 1 each 5  ? ALPRAZolam (XANAX) 0.5 MG tablet TAKE 1 TABLET BY MOUTH THREE TIMES DAILY AS NEEDED 90 tablet 2  ? amLODipine (NORVASC) 10 MG tablet Take 1 tablet (10 mg total) by mouth daily. 90 tablet 3  ? aspirin EC 81 MG tablet Take 81 mg by mouth daily.    ? benzonatate (TESSALON) 100 MG capsule Take 1 capsule (100 mg total) by mouth 2 (two) times daily as needed for cough. (Patient not taking: Reported on 11/24/2021) 20 capsule 0  ? budesonide-formoterol (SYMBICORT) 160-4.5 MCG/ACT inhaler Inhale 2 puffs into the lungs 2 (two) times daily.    ? cetirizine (ZYRTEC) 10 MG tablet Take  1 tablet (10 mg total) by mouth daily. 30 tablet 11  ? Cholecalciferol (VITAMIN D3 PO) Take 1 tablet by mouth daily.    ? cyclobenzaprine (FLEXERIL) 10 MG tablet Take 10 mg by mouth 2 (two) times daily as needed for muscle spasms.     ? diphenoxylate-atropine (LOMOTIL) 2.5-0.025 MG tablet Take 2.5 mg by mouth daily as needed.     ? esomeprazole (NEXIUM) 40 MG capsule Take 40 mg by mouth daily.    ? FLUoxetine (PROZAC) 40 MG capsule Take 1 capsule (40 mg total) by mouth 2 (two) times daily. 60 capsule 5  ? fluticasone (FLONASE) 50 MCG/ACT nasal spray Place 2 sprays into both nostrils daily. 16 g 6  ? furosemide (LASIX) 40 MG tablet Take 1 tablet (40 mg total) by mouth daily. 30 tablet 0  ? gabapentin (NEURONTIN) 300 MG capsule Take 1 capsule by mouth 4 (four) times daily. (Patient not  taking: Reported on 11/24/2021)    ? ibuprofen (ADVIL) 800 MG tablet Take 800 mg by mouth every 8 (eight) hours as needed.    ? mometasone-formoterol (DULERA) 100-5 MCG/ACT AERO Inhale 2 puffs into the lungs 2 (two) times daily.    ? Multiple Vitamins-Minerals (CENTRUM SILVER PO) Take 1 tablet by mouth daily.    ? nitroGLYCERIN (NITROSTAT) 0.4 MG SL tablet Place 1 tablet (0.4 mg total) under the tongue every 5 (five) minutes x 3 doses as needed for chest pain (if no relief after 3rd dose, proceed to the ED for an evaluation). 25 tablet 3  ? ondansetron (ZOFRAN) 4 MG tablet Take 4 mg by mouth every 8 (eight) hours as needed. (Patient not taking: Reported on 11/24/2021)    ? potassium chloride (KLOR-CON) 20 MEQ packet Take 20 mEq by mouth 2 (two) times daily. 60 tablet 6  ? ranolazine (RANEXA) 1000 MG SR tablet Take 1 tablet (1,000 mg total) by mouth 2 (two) times daily. 180 tablet 3  ? rOPINIRole (REQUIP) 1 MG tablet Take 1 tablet (1 mg total) by mouth at bedtime as needed (for restless leg). 30 tablet 2  ? rosuvastatin (CRESTOR) 40 MG tablet Take 1 tablet (40 mg total) by mouth daily. 90 tablet 3  ? vitamin B-12 (CYANOCOBALAMIN) 100 MCG tablet Take 100 mcg by mouth daily.    ? zolpidem (AMBIEN) 10 MG tablet TAKE 1 TABLET BY MOUTH AT BEDTIME 30 tablet 2  ? ?No current facility-administered medications for this visit.  ? ? ? ?Past Surgical History:  ?Procedure Laterality Date  ? CARDIAC CATHETERIZATION    ? CORONARY ARTERY BYPASS GRAFT  2009  ? 3V CABG LIMA --> LAD, SVG --> [place],  SVG --> RCA  ? LEFT HEART CATH AND CORS/GRAFTS ANGIOGRAPHY N/A 04/24/2021  ? Procedure: LEFT HEART CATH AND CORS/GRAFTS ANGIOGRAPHY;  Surgeon: Burnell Blanks, MD;  Location: [facility];  Service: Cardiovascular;  Laterality: N/A;  ? TUBAL LIGATION  1990  ? ? ? ?Allergies  ?Allergen Reactions  ? Other   ? Imdur [Isosorbide Nitrate] Other (See Comments)  ?  Headaches; Worsening Chest Pain  ? ? ? ? ?Family History   ?Problem Relation Age of Onset  ? CAD Mother   ? Hypertension Mother   ? Stroke Mother   ? Heart disease Father   ? Diabetes type II Father   ? Intellectual disability Maternal Aunt   ? Intellectual disability Maternal Uncle   ? Alcohol abuse Paternal Grandfather   ? Colon cancer Neg Hx   ?  Breast cancer Neg Hx   ? Lung cancer Neg Hx   ? ? ? ?Social History ?[name] reports that she quit smoking about 3 years ago. Her smoking use included cigarettes. She started smoking about 40 years ago. She has a 17.50 pack-year smoking history. She has never used smokeless tobacco. ?[name] reports that she does not currently use alcohol. ? ? ?Review of Systems ?CONSTITUTIONAL: No weight loss, fever, chills, weakness or fatigue.  ?HEENT: Eyes: No visual loss, blurred vision, double vision or yellow sclerae.No hearing loss, sneezing, congestion, runny nose or sore throat.  ?SKIN: No rash or itching.  ?CARDIOVASCULAR: per hpi ?RESPIRATORY: No shortness of breath, cough or sputum.  ?GASTROINTESTINAL: No anorexia, nausea, vomiting or diarrhea. No abdominal pain or blood.  ?GENITOURINARY: No burning on urination, no polyuria ?NEUROLOGICAL: No headache, dizziness, syncope, paralysis, ataxia, numbness or tingling in the extremities. No change in bowel or bladder control.  ?MUSCULOSKELETAL: No muscle, back pain, joint pain or stiffness.  ?LYMPHATICS: No enlarged nodes. No history of splenectomy.  ?PSYCHIATRIC: No history of depression or anxiety.  ?ENDOCRINOLOGIC: No reports of sweating, cold or heat intolerance. No polyuria or polydipsia.  ?. ? ? ?Physical Examination ?Today's Vitals  ? 03/02/22 1132  ?BP: 138/74  ?Pulse: 62  ?SpO2: 97%  ?Weight: 145 lb (65.8 kg)  ?Height: 5\' 2"  (1.575 m)  ? ?Body mass index is 26.52 kg/m?. ? ?Gen: resting comfortably, no acute distress ?HEENT: no scleral icterus, pupils equal round and reactive, no palptable cervical adenopathy,  ?CV: RRR, no m/r/g no jvd ?Resp: Clear to auscultation  bilaterally ?GI: abdomen is soft, non-tender, non-distended, normal bowel sounds, no hepatosplenomegaly ?MSK: extremities are warm, no edema.  ?Skin: warm, no rash ?Neuro:  no focal deficits ?Psych: appropriate affe

## 2022-03-02 NOTE — Patient Instructions (Addendum)
Medication Instructions:  ?Your physician has recommended you make the following change in your medication:  ?Stop rosuvastatin (crestor) ?Start losartan 12.5 mg daily ?Continue other medications the same ? ?Labwork: ?none ? ?Testing/Procedures: ?none ? ?Follow-Up: ?Your physician recommends that you schedule a follow-up appointment in: 6 months ? ?Any Other Special Instructions Will Be Listed Below (If Applicable). ?You have been referred to [facility]-you will be contacted with this appointment ? ?If you need a refill on your cardiac medications before your next appointment, please call your pharmacy. ?

## 2022-03-04 ENCOUNTER — Other Ambulatory Visit: Payer: Self-pay | Admitting: Nurse Practitioner

## 2022-03-05 ENCOUNTER — Other Ambulatory Visit: Payer: Self-pay

## 2022-03-05 ENCOUNTER — Other Ambulatory Visit: Payer: Self-pay | Admitting: Nurse Practitioner

## 2022-03-05 MED ORDER — MOMETASONE FURO-FORMOTEROL FUM 100-5 MCG/ACT IN AERO: 2.0000 | INHALATION_SPRAY | Freq: Two times a day (BID) | RESPIRATORY_TRACT | 0 refills | Status: DC

## 2022-03-05 MED ORDER — ESOMEPRAZOLE MAGNESIUM 40 MG PO CPDR: 40.0000 mg | DELAYED_RELEASE_CAPSULE | Freq: Every day | ORAL | 0 refills | Status: DC

## 2022-03-05 MED ORDER — ESOMEPRAZOLE MAGNESIUM 40 MG PO CPDR
40.0000 mg | DELAYED_RELEASE_CAPSULE | Freq: Every day | ORAL | 0 refills | Status: DC
Start: 2022-03-05 — End: 2022-05-24

## 2022-03-10 ENCOUNTER — Ambulatory Visit: Payer: BC Managed Care – PPO | Admitting: Internal Medicine

## 2022-03-10 ENCOUNTER — Encounter: Payer: Self-pay | Admitting: Internal Medicine

## 2022-03-10 DIAGNOSIS — R0609 Other forms of dyspnea: Secondary | ICD-10-CM

## 2022-03-10 DIAGNOSIS — J449 Chronic obstructive pulmonary disease, unspecified: Secondary | ICD-10-CM

## 2022-03-10 MED ORDER — BUDESONIDE-FORMOTEROL FUMARATE 160-4.5 MCG/ACT IN AERO
INHALATION_SPRAY | RESPIRATORY_TRACT | 11 refills | Status: DC
Start: 2022-03-10 — End: 2023-10-01

## 2022-03-10 NOTE — Assessment & Plan Note (Addendum)
03/10/2022   Walked on RA  x  3  lap(s) =  approx 450  ft  @ mod fast pace, stopped due to end of study with sob on 2nd lap with lowest 02 sats 98%    Sent labs for w/u for unexplained doe but suspect this will mostly be explained by copd and should be at least partially reversible with optimal hfa/ addition of LAMA when returns for f/u   Each maintenance medication was reviewed in detail including emphasizing most importantly the difference between maintenance and prns and under what circumstances the prns are to be triggered using an action plan format where appropriate.  Total time for H and P, chart review, counseling, reviewing hfa device(s) , directly observing portions of ambulatory 02 saturation study/ and generating customized AVS unique to this office visit / same day charting  > 45 min with pt new to me

## 2022-03-10 NOTE — Assessment & Plan Note (Addendum)
Quit smoking 11/2018/ MZ   And worse since then - PFT's  06/10/18   FEV1 1.75 (68 % ) ratio 0.63  p 8 % improvement from saba p ? prior to study with DLCO  12.32 (21.56%) corrects to 78%  and FV curve mild concavity and ERV 50% at wt 138   - 03/10/2022  After extensive coaching inhaler device,  effectiveness =    80% from a baseline of 50% (short Ti, no breath hold) > continue symbicort 160 1-2 q 12 h pending f/u pfts  -  03/10/2022  allergy screen  IgE = 20/ Eos 0.1   and  alpha one AT phenotype  MZ   Level 88   Since she has a full cannister ofc symbicort but palpitations from 2 q 12 h with poor hfa rec trial of one or two puffs every 12 h with the 2 puffs timed before heave exertion and only use saba prn   Re SABA :  I spent extra time with pt today reviewing appropriate use of albuterol for prn use on exertion with the following points: 1) saba is for relief of sob that does not improve by walking a slower pace or resting but rather if the pt does not improve after trying this first. 2) If the pt is convinced, as many are, that saba helps recover from activity faster then it's easy to tell if this is the case by re-challenging : ie stop, take the inhaler, then p 5 minutes try the exact same activity (intensity of workload) that just caused the symptoms and see if they are substantially diminished or not after saba 3) if there is an activity that reproducibly causes the symptoms, try the saba 15 min before the activity on alternate days   If in fact the saba really does help, then fine to continue to use it prn but advised may need to look closer at the maintenance regimen being used to achieve better control of airways disease with exertion.   >>> f/u @ 3 m for pfts and note may be a candidate for alpha one replacement if FEV1 down substantially while off cigs    Low-dose CT lung cancer screening is recommended for patients who are 3-20 years of age with a 20+ pack-year history of smoking and who  are currently smoking or quit <=15 years ago. No coughing up blood  No unintentional weight loss of > 15 pounds in the last 6 months - pt is eligible for scanning yearly until 2035 > referred for shared decision making

## 2022-03-10 NOTE — Progress Notes (Signed)
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    History of Present Illness  03/10/2022  Pulmonary/ 1st office eval/ [facility] / [place] [facility] on symbicort prn  Chief Complaint  Patient presents with   Consult    Prev Dr. Juanetta GoslingHawkins pt. SOB   Dyspnea:  feed the horse carries buckets half full a quarter mile slt downhill and stops half way on return trip typically does so around around 5 pm  Cough: some increase with pollen/assoc rhinitis (just this year first time she noted) Sleep: flat pillows   SABA use: proair and symbicort seem to help but make her heart flutter  HB on nexium at hs  No obvious other patterns in day to day or daytime variability or assoc excess/ purulent sputum or mucus plugs or hemoptysis or cp or chest tightness, subjective wheeze      Also denies any obvious fluctuation of symptoms with weather or environmental changes or other aggravating or alleviating factors except as outlined above   No unusual exposure hx or h/o childhood pna/ asthma or knowledge of premature birth.  Current Allergies, Complete Past Medical History, Past Surgical History, Family History, and Social History were reviewed in [facility] electronic medical record.  ROS  The following are not active complaints unless bolded Hoarseness, sore throat, dysphagia, dental problems, itching, sneezing,  nasal congestion or discharge of excess mucus or purulent secretions, ear ache,   fever, chills, sweats, unintended wt loss or wt gain, classically pleuritic or exertional cp,  orthopnea pnd or arm/hand swelling  or leg swelling, presyncope, palpitations, abdominal pain, anorexia, nausea, vomiting, diarrhea  or change in bowel habits or change in bladder habits, change in stools or change in urine, dysuria,  hematuria,  rash, arthralgias, visual complaints, headache, numbness, weakness or ataxia or problems with walking or coordination,  change in mood/ anxious or  memory.           Past Medical History:  Diagnosis Date   ADD (attention deficit disorder with hyperactivity)    Anxiety    Bipolar 1 disorder (HCC)    Carotid artery disease (HCC)    Right 50-69% stenosis by doppler 2010   Chronic diastolic (congestive) heart failure (HCC)    COPD (chronic obstructive pulmonary disease) (HCC)    Phreesia 12/15/2020   Coronary artery disease    a. s/p CABG in 2009 with LIMA-LAD, SVG-RI, and SVG-RCA b. low-risk NST in 10/2015 c. low-risk NST in 12/2018.   CVA (cerebral infarction) 12/2008   Reports a neurologist told her she had a blood clot in her brain   Depression    Depression    Phreesia 12/15/2020   Emphysema of lung (HCC) 06/19/2018   Fibromyalgia    GERD (gastroesophageal reflux disease)    Hyperlipidemia    Hypertension    Myocardial infarction (HCC) 09/18/2011   Phreesia 12/15/2020   OSA on CPAP 12/31/2018   Sleep apnea    Phreesia 12/15/2020   Stroke (HCC)    Phreesia 12/15/2020   Tobacco abuse     Outpatient Medications Prior to Visit - - NOTE:   Unable to verify as accurately reflecting what pt takes    Medication Sig Dispense Refill   Albuterol Sulfate (PROAIR RESPICLICK) 108 (90 Base) MCG/ACT AEPB Inhale 2  puffs into the lungs every 6 (six) hours as needed. 1 each 5   ALPRAZolam (XANAX) 0.5 MG tablet TAKE 1 TABLET BY MOUTH THREE TIMES DAILY AS NEEDED 90 tablet 2   aspirin EC 81 MG tablet Take 81 mg by mouth daily.     benzonatate (TESSALON) 100 MG capsule Take 1 capsule (100 mg total) by mouth 2 (two) times daily as needed for cough. 20 capsule 0   budesonide-formoterol (SYMBICORT) 160-4.5 MCG/ACT inhaler Inhale 2 puffs into the lungs 2 (two) times daily.     cetirizine (ZYRTEC) 10 MG tablet Take 1 tablet (10 mg total) by mouth daily. 30 tablet 11   Cholecalciferol  (VITAMIN D3 PO) Take 1 tablet by mouth daily.     cyclobenzaprine (FLEXERIL) 10 MG tablet Take 10 mg by mouth 2 (two) times daily as needed for muscle spasms.      diphenoxylate-atropine (LOMOTIL) 2.5-0.025 MG tablet Take 2.5 mg by mouth daily as needed.      esomeprazole (NEXIUM) 40 MG capsule Take 1 capsule (40 mg total) by mouth daily. 30 capsule 0   FLUoxetine (PROZAC) 40 MG capsule Take 1 capsule (40 mg total) by mouth 2 (two) times daily. 60 capsule 5   fluticasone (FLONASE) 50 MCG/ACT nasal spray Place 2 sprays into both nostrils daily. 16 g 6   furosemide (LASIX) 40 MG tablet Take 1 tablet (40 mg total) by mouth daily. 30 tablet 0   gabapentin (NEURONTIN) 300 MG capsule Take 1 capsule by mouth 4 (four) times daily.     ibuprofen (ADVIL) 800 MG tablet Take 800 mg by mouth every 8 (eight) hours as needed.     losartan (COZAAR) 25 MG tablet Take 0.5 tablets (12.5 mg total) by mouth daily. 45 tablet 3   Multiple Vitamins-Minerals (CENTRUM SILVER PO) Take 1 tablet by mouth daily.     nitroGLYCERIN (NITROSTAT) 0.4 MG SL tablet Place 1 tablet (0.4 mg total) under the tongue every 5 (five) minutes x 3 doses as needed for chest pain (if no relief after 3rd dose, proceed to the ED for an evaluation). 25 tablet 3   ondansetron (ZOFRAN) 4 MG tablet Take 4 mg by mouth every 8 (eight) hours as needed.     potassium chloride (KLOR-CON) 20 MEQ packet Take 20 mEq by mouth 2 (two) times daily. 60 tablet 6   ranolazine (RANEXA) 1000 MG SR tablet Take 1 tablet (1,000 mg total) by mouth 2 (two) times daily. 180 tablet 3   rOPINIRole (REQUIP) 1 MG tablet Take 1 tablet (1 mg total) by mouth at bedtime as needed (for restless leg). 30 tablet 2   vitamin B-12 (CYANOCOBALAMIN) 100 MCG tablet Take 100 mcg by mouth daily.     zolpidem (AMBIEN) 10 MG tablet TAKE 1 TABLET BY MOUTH AT BEDTIME 30 tablet 2   amLODipine (NORVASC) 10 MG tablet Take 1 tablet (10 mg total) by mouth daily. 90 tablet 3   mometasone-formoterol  (DULERA) 100-5 MCG/ACT AERO Inhale 2 puffs into the lungs 2 (two) times daily. 1 each 0   No facility-administered medications prior to visit.     Objective:     BP 128/70 (BP Location: Left Arm, Patient Position: Sitting)   Pulse (!) 48   Temp 98.7 F (37.1 C) (Temporal)   Ht  (1.575 m)   Wt 146 lb 12.8 oz (66.6 kg)   LMP 10/22/2015   SpO2 100% Comment: ra  BMI 26.85 kg/m   SpO2: 100 % (ra)  Amb wf nad      HEENT : Oropharynx  clear/   Nasal turbintes nl    NECK :  without  appent JVD/ palpable Nodes/TM    LUNGS: no acc muscle use,  Min barrel  contour chest wall with bilateral  slightly decreased bs s audible wheeze and  without cough on insp or exp maneuvers and min  Hyperresonant  to  percussion bilaterally    CV:  RRR  no s3 or murmur or increase in P2, and no edema   ABD:  soft and nontender with pos end  insp Hoover's  in the supine position.  No bruits or organomegaly appreciated   MS:  Nl gait/ ext warm without deformities Or obvious joint restrictions  calf tenderness, cyanosis or clubbing     SKIN: warm and dry without lesions    NEURO:  alert, approp, nl sensorium with  no motor or cerebellar deficits apparent.         I personally reviewed images and agree with radiology impression as follows:  CXR:   04/23/21 Wnl    Labs ordered 03/10/2022  :  allergy scren  IgE = 20/ Eos 0.1   and  alpha one AT phenotype  88 MZ  Labs ordered/ reviewed:      Chemistry      Component Value Date/Time   NA 140 11/20/2021 0810   K 4.2 11/20/2021 0810   CL 102 11/20/2021 0810   CO2 24 11/20/2021 0810   BUN 10 11/20/2021 0810   CREATININE 0.75 11/20/2021 0810      Component Value Date/Time   CALCIUM 9.4 11/20/2021 0810   ALKPHOS 100 11/20/2021 0810   AST 16 11/20/2021 0810   ALT 12 11/20/2021 0810   BILITOT 0.2 11/20/2021 0810        Lab Results  Component Value Date   WBC 6.9 03/10/2022   HGB 11.3 03/10/2022   HCT 34.2 03/10/2022   MCV 86  03/10/2022   PLT 354 03/10/2022     Lab Results  Component Value Date   DDIMER 0.61 (H) 10/07/2019      Lab Results  Component Value Date   TSH 2.100 03/10/2022     Lab Results  Component Value Date   PROBNP 31.0 10/08/2008       Lab Results  Component Value Date   ESRSEDRATE 19 06/17/2009   ESRSEDRATE 57 (H) 10/13/2008           Assessment   COPD GOLD 2 / MZ Quit smoking 11/2018/ MZ   And worse since then - PFT's  06/10/18   FEV1 1.75 (68 % ) ratio 0.63  p 8 % improvement from saba p ? prior to study with DLCO  12.32 (21.56%) corrects to 78%  and FV curve mild concavity and ERV 50% at wt 138   - 03/10/2022  After extensive coaching inhaler device,  effectiveness =    80% from a baseline of 50% (short Ti, no breath hold) > continue symbicort 160 1-2 q 12 h pending f/u pfts  -  03/10/2022  allergy screen  IgE = 20/ Eos 0.1   and  alpha one AT phenotype  MZ   Level 88   Since she has a full cannister ofc symbicort but palpitations from 2 q 12 h with poor hfa rec trial of one or two puffs every 12 h with the 2 puffs timed before heave exertion and only use saba prn   Re SABA :  I spent extra time with pt today reviewing appropriate use of albuterol for prn use on exertion with the following points: 1) saba is for relief of sob that does not improve by walking a slower pace or resting but rather if the pt does not improve after trying this first. 2) If the pt is convinced, as many are, that saba helps recover from activity faster then it's easy to tell if this is the case by re-challenging : ie stop, take the inhaler, then p 5 minutes try the exact same activity (intensity of workload) that just caused the symptoms and see if they are substantially diminished or not after saba 3) if there is an activity that reproducibly causes the symptoms, try the saba 15 min before the activity on alternate days   If in fact the saba really does help, then fine to continue to use it prn but  advised may need to look closer at the maintenance regimen being used to achieve better control of airways disease with exertion.   >>> f/u @ 3 m for pfts and note may be a candidate for alpha one replacement if FEV1 down substantially while off cigs    Low-dose CT lung cancer screening is recommended for patients who are 42-69 years of age with a 20+ pack-year history of smoking and who are currently smoking or quit <=15 years ago. No coughing up blood  No unintentional weight loss of > 15 pounds in the last 6 months - pt is eligible for scanning yearly until 2035 > referred for shared decision making    DOE (dyspnea on exertion) 03/10/2022   Walked on RA  x  3  lap(s) =  approx 450  ft  @ mod fast pace, stopped due to end of study with sob on 2nd lap with lowest 02 sats 98%    Sent labs for w/u for unexplained doe (wnl)  but suspect this will mostly be explained by copd and should be at least partially reversible with optimal hfa/ addition of LAMA when returns for f/u with pfts in 3 m  Each maintenance medication was reviewed in detail including emphasizing most importantly the difference between maintenance and prns and under what circumstances the prns are to be triggered using an action plan format where appropriate.  Total time for H and P, chart review, counseling, reviewing hfa device(s) , directly observing portions of ambulatory 02 saturation study/ and generating customized AVS unique to this office visit / same day charting  64  min with pt new to me                                      Sandrea Hughs, MD 03/10/2022

## 2022-03-10 NOTE — Patient Instructions (Addendum)
Plan A = Automatic = Always=    Symbicort 160  one - two puffs every 12 hours  ? ?Work on inhaler technique:  relax and gently blow all the way out then take a nice smooth full deep breath back in, triggering the inhaler at same time you start breathing in.  Hold for up to 5 seconds if you can. Blow out thru nose. Rinse and gargle with water when done.  If mouth or throat bother you at all,  try brushing teeth/gums/tongue with arm and hammer toothpaste/ make a slurry and gargle and spit out.  ? ?Nexium Take 30-60 min before a meal  ? ?Plan B = Backup (to supplement plan A, not to replace it) ?Only use your albuterol inhaler as a rescue medication to be used if you can't catch your breath by resting or doing a relaxed purse lip breathing pattern.  ?- The less you use it, the better it will work when you need it. ?- Ok to use the inhaler up to 2 puffs  every 4 hours if you must but call for appointment if use goes up over your usual need ?- Don't leave home without it !!  (think of it like the spare tire for your car)  ? ?Also Ok to try albuterol 15 min before an activity (on alternating days)  that you know would usually make you short of breath and see if it makes any difference and if makes none then don't take albuterol after activity unless you can't catch your breath as this means it's the resting that helps, not the albuterol. ? ?My office will be contacting you by phone for referral for lung cancer screening ? ?   ?Please remember to go to the lab department   for your tests - we will call you with the results when they are available. ? ?Please schedule a follow up visit in 3 months but call sooner if needed with pfts  ?    ? ?    ?

## 2022-03-11 ENCOUNTER — Telehealth: Payer: Self-pay

## 2022-03-11 ENCOUNTER — Telehealth: Payer: Self-pay | Admitting: Nurse Practitioner

## 2022-03-11 NOTE — Telephone Encounter (Signed)
Pt said she has been on med for 3 mo

## 2022-03-11 NOTE — Telephone Encounter (Signed)
Pt states that it's handled by dr ward he put her on symbicort instead

## 2022-03-11 NOTE — Telephone Encounter (Signed)
Called pt to inquire about how long she has been on dulera no answer left vm

## 2022-03-11 NOTE — Telephone Encounter (Signed)
Has been on med for 3 mo

## 2022-03-12 NOTE — Telephone Encounter (Signed)
Called pt no answer left detailed message advised if questions contact us

## 2022-03-14 ENCOUNTER — Encounter: Payer: Self-pay | Admitting: Internal Medicine

## 2022-03-16 LAB — CBC WITH DIFFERENTIAL/PLATELET
Basophils Absolute: 0.1 10*3/uL (ref 0.0–0.2)
Basos: 1 %
EOS (ABSOLUTE): 0.1 10*3/uL (ref 0.0–0.4)
Eos: 2 %
Hematocrit: 34.2 % (ref 34.0–46.6)
Hemoglobin: 11.3 g/dL (ref 11.1–15.9)
Immature Grans (Abs): 0 10*3/uL (ref 0.0–0.1)
Immature Granulocytes: 0 %
Lymphocytes Absolute: 3.9 10*3/uL — ABNORMAL HIGH (ref 0.7–3.1)
Lymphs: 55 %
MCH: 28.5 pg (ref 26.6–33.0)
MCHC: 33 g/dL (ref 31.5–35.7)
MCV: 86 fL (ref 79–97)
Monocytes Absolute: 0.3 10*3/uL (ref 0.1–0.9)
Monocytes: 5 %
Neutrophils Absolute: 2.6 10*3/uL (ref 1.4–7.0)
Neutrophils: 37 %
Platelets: 354 10*3/uL (ref 150–450)
RBC: 3.97 x10E6/uL (ref 3.77–5.28)
RDW: 12.9 % (ref 11.7–15.4)
WBC: 6.9 10*3/uL (ref 3.4–10.8)

## 2022-03-16 LAB — IGE: IgE (Immunoglobulin E), Serum: 20 IU/mL (ref 6–495)

## 2022-03-16 LAB — BRAIN NATRIURETIC PEPTIDE: BNP: 16.5 pg/mL (ref 0.0–100.0)

## 2022-03-16 LAB — ALPHA-1-ANTITRYPSIN PHENOTYP: A-1 Antitrypsin: 88 mg/dL — ABNORMAL LOW (ref 101–187)

## 2022-03-16 LAB — TSH: TSH: 2.1 u[IU]/mL (ref 0.450–4.500)

## 2022-03-18 NOTE — Addendum Note (Signed)
Addended by: Sandrea Hughs B on: 03/18/2022 09:07 AM   Modules accepted: Level of Service

## 2022-03-31 ENCOUNTER — Other Ambulatory Visit: Payer: Self-pay | Admitting: Adult Health

## 2022-03-31 DIAGNOSIS — F411 Generalized anxiety disorder: Secondary | ICD-10-CM

## 2022-04-05 NOTE — Telephone Encounter (Signed)
Please schedule appt

## 2022-04-14 ENCOUNTER — Ambulatory Visit: Payer: BC Managed Care – PPO | Admitting: Internal Medicine

## 2022-04-14 ENCOUNTER — Encounter: Payer: Self-pay | Admitting: Internal Medicine

## 2022-04-14 VITALS — BP 130/70 | HR 59 | Ht 62.0 in | Wt 147.0 lb

## 2022-04-14 DIAGNOSIS — M791 Myalgia, unspecified site: Secondary | ICD-10-CM

## 2022-04-14 DIAGNOSIS — T466X5A Adverse effect of antihyperlipidemic and antiarteriosclerotic drugs, initial encounter: Secondary | ICD-10-CM

## 2022-04-14 DIAGNOSIS — Z951 Presence of aortocoronary bypass graft: Secondary | ICD-10-CM

## 2022-04-14 DIAGNOSIS — I2510 Atherosclerotic heart disease of native coronary artery without angina pectoris: Secondary | ICD-10-CM | POA: Diagnosis not present

## 2022-04-14 DIAGNOSIS — E785 Hyperlipidemia, unspecified: Secondary | ICD-10-CM | POA: Diagnosis not present

## 2022-04-14 DIAGNOSIS — Z8673 Personal history of transient ischemic attack (TIA), and cerebral infarction without residual deficits: Secondary | ICD-10-CM | POA: Diagnosis not present

## 2022-04-14 MED ORDER — REPATHA SURECLICK 140 MG/ML ~~LOC~~ SOAJ: 2.0000 mL | SUBCUTANEOUS | 1 refills | Status: DC

## 2022-04-14 NOTE — Progress Notes (Signed)
OFFICE CONSULT NOTE  Chief Complaint:  Manage dyslipidemia  Primary Care Physician: Donell Beers, FNP  HPI:  [name] is a 56 y.o. female who is being seen today for the evaluation of dyslipidemia at the request of Branch, Dorothe Pea, MD. This is a pleasant 56 year old female kindly referred by Dr. Wyline Mood for evaluation management of dyslipidemia.  He had recently seen her in the eating office in May 2023.  She has a history of coronary artery disease and dyslipidemia but has been intolerant to statins and recently stopped taking Crestor.  She reports having tried other statins in the past with similar symptoms which included myalgias.  She is referred for evaluation management of possible PCSK9 inhibitor therapy.  Her coronary history including cath in 2022 which showed an occluded right coronary artery and vein grafts having had prior CABG in 2009.  Her goal LDL is less than 70.  Recent labs showed total cholesterol 235, triglycerides 221, HDL 50 and LDL 145.  She also reports having 2 prior strokes.  PMHx:  Past Medical History:  Diagnosis Date   ADD (attention deficit disorder with hyperactivity)    Anxiety    Bipolar 1 disorder (HCC)    Carotid artery disease (HCC)    Right 50-69% stenosis by doppler 2010   Chronic diastolic (congestive) heart failure (HCC)    COPD (chronic obstructive pulmonary disease) (HCC)    Phreesia 12/15/2020   Coronary artery disease    a. s/p CABG in 2009 with LIMA-LAD, SVG-RI, and SVG-RCA b. low-risk NST in 10/2015 c. low-risk NST in 12/2018.   CVA (cerebral infarction) 12/2008   Reports a neurologist told her she had a blood clot in her brain   Depression    Depression    Phreesia 12/15/2020   Emphysema of lung (HCC) 06/19/2018   Fibromyalgia    GERD (gastroesophageal reflux disease)    Hyperlipidemia    Hypertension    Myocardial infarction (HCC) 09/18/2011   Phreesia 12/15/2020   OSA on CPAP 12/31/2018   Sleep apnea    Phreesia  12/15/2020   Stroke (HCC)    Phreesia 12/15/2020   Tobacco abuse     Past Surgical History:  Procedure Laterality Date   CARDIAC CATHETERIZATION     CORONARY ARTERY BYPASS GRAFT  2009   3V CABG LIMA --> LAD, SVG --> Ramus Intermediate,  SVG --> RCA   LEFT HEART CATH AND CORS/GRAFTS ANGIOGRAPHY N/A 04/24/2021   Procedure: LEFT HEART CATH AND CORS/GRAFTS ANGIOGRAPHY;  Surgeon: Kathleene Hazel, MD;  Location: [facility];  Service: Cardiovascular;  Laterality: N/A;   TUBAL LIGATION  1990    FAMHx:  Family History  Problem Relation Age of Onset   CAD Mother    Hypertension Mother    Stroke Mother    Heart disease Father    Diabetes type II Father    Intellectual disability Maternal Aunt    Intellectual disability Maternal Uncle    Alcohol abuse Paternal Grandfather    Colon cancer Neg Hx    Breast cancer Neg Hx    Lung cancer Neg Hx     SOCHx:   reports that she quit smoking about 3 years ago. Her smoking use included cigarettes. She started smoking about 40 years ago. She has a 17.50 pack-year smoking history. She has never used smokeless tobacco. She reports that she does not currently use alcohol. She reports that she does not use drugs.  ALLERGIES:  Allergies  Allergen Reactions   Other    Imdur [Isosorbide Nitrate] Other (See Comments)    Headaches; Worsening Chest Pain    ROS: Pertinent items noted in HPI and remainder of comprehensive ROS otherwise negative.  HOME MEDS: Current Outpatient Medications on File Prior to Visit  Medication Sig Dispense Refill   Albuterol Sulfate (PROAIR RESPICLICK) 108 (90 Base) MCG/ACT AEPB Inhale 2 puffs into the lungs every 6 (six) hours as needed. 1 each 5   ALPRAZolam (XANAX) 0.5 MG tablet TAKE 1 TABLET BY MOUTH THREE TIMES DAILY AS NEEDED 90 tablet 2   aspirin EC 81 MG tablet Take 81 mg by mouth daily.     benzonatate (TESSALON) 100 MG capsule Take 1 capsule (100 mg total) by mouth 2 (two) times daily as needed for  cough. 20 capsule 0   budesonide-formoterol (SYMBICORT) 160-4.5 MCG/ACT inhaler 1-2 puffs every 12 hours 1 each 11   Cholecalciferol (VITAMIN D3 PO) Take 1 tablet by mouth daily.     cyclobenzaprine (FLEXERIL) 10 MG tablet Take 10 mg by mouth 2 (two) times daily as needed for muscle spasms.      diphenoxylate-atropine (LOMOTIL) 2.5-0.025 MG tablet Take 2.5 mg by mouth daily as needed.      esomeprazole (NEXIUM) 40 MG capsule Take 1 capsule (40 mg total) by mouth daily. 30 capsule 0   FLUoxetine (PROZAC) 40 MG capsule Take 1 capsule (40 mg total) by mouth 2 (two) times daily. 60 capsule 5   fluticasone (FLONASE) 50 MCG/ACT nasal spray Place 2 sprays into both nostrils daily. 16 g 6   furosemide (LASIX) 40 MG tablet Take 1 tablet (40 mg total) by mouth daily. 30 tablet 0   gabapentin (NEURONTIN) 300 MG capsule Take 1 capsule by mouth 4 (four) times daily.     ibuprofen (ADVIL) 800 MG tablet Take 800 mg by mouth every 8 (eight) hours as needed.     losartan (COZAAR) 25 MG tablet Take 0.5 tablets (12.5 mg total) by mouth daily. 45 tablet 3   Multiple Vitamins-Minerals (CENTRUM SILVER PO) Take 1 tablet by mouth daily.     nitroGLYCERIN (NITROSTAT) 0.4 MG SL tablet Place 1 tablet (0.4 mg total) under the tongue every 5 (five) minutes x 3 doses as needed for chest pain (if no relief after 3rd dose, proceed to the ED for an evaluation). 25 tablet 3   ondansetron (ZOFRAN) 4 MG tablet Take 4 mg by mouth every 8 (eight) hours as needed.     potassium chloride (KLOR-CON) 20 MEQ packet Take 20 mEq by mouth 2 (two) times daily. 60 tablet 6   ranolazine (RANEXA) 1000 MG SR tablet Take 1 tablet (1,000 mg total) by mouth 2 (two) times daily. 180 tablet 3   rOPINIRole (REQUIP) 1 MG tablet Take 1 tablet (1 mg total) by mouth at bedtime as needed (for restless leg). 30 tablet 2   vitamin B-12 (CYANOCOBALAMIN) 100 MCG tablet Take 100 mcg by mouth daily.     zolpidem (AMBIEN) 10 MG tablet TAKE 1 TABLET BY MOUTH AT  BEDTIME 30 tablet 2   No current facility-administered medications on file prior to visit.    LABS/IMAGING: No results found for this or any previous visit (from the past 48 hour(s)). No results found.  LIPID PANEL:    Component Value Date/Time   CHOL 235 (H) 11/20/2021 0810   TRIG 221 (H) 11/20/2021 0810   HDL 50 11/20/2021 0810   CHOLHDL 4.7 (H) 11/20/2021 0810   CHOLHDL  4.1 Ratio 09/29/2009 2037   VLDL 37 09/29/2009 2037   LDLCALC 145 (H) 11/20/2021 0810    WEIGHTS: Wt Readings from Last 3 Encounters:  04/14/22 147 lb (66.7 kg)  03/10/22 146 lb 12.8 oz (66.6 kg)  03/02/22 145 lb (65.8 kg)    VITALS: BP 130/70   Pulse (!) 59   Ht 5\' 2"  (1.575 m)   Wt 147 lb (66.7 kg)   LMP 10/22/2015   SpO2 98%   BMI 26.89 kg/m   EXAM: Deferred  EKG: N/A  ASSESSMENT : Mixed dyslipidemia, goal LDL less than 55 Statin intolerant-myalgias CAD status post CABG in 2009 2 prior strokes COPD  PLAN: 1.   Mrs. Boudreau has significant increased risk of cardiovascular disease with 2 prior strokes and coronary artery disease with prior CABG.  She has been intolerant of statins for years and more recently was on rosuvastatin but has failed several other statins due to significant myalgias.  Her most recent lipids show total cholesterol 145 with a target LDL less than 55.  She needs more aggressive therapy and I would recommend a PCSK9 inhibitor.  We will reach out for prior authorization for that.  Plan to repeat lipid NMR and LP(a) in 3 to 4 months and follow-up with me at that time.  Thanks again for the kind referral.  2010, MD, [facility]  [place]  [facility]  Medical Director of the [facility]  [facility] Diplomate of the American Board of Clinical Lipidology Attending Cardiologist  Direct Dial: [phone]  Fax: [phone]  Website:  [email or web address] 04/14/2022, 2:53 PM

## 2022-04-14 NOTE — Addendum Note (Signed)
Addended by: Chrystie Nose on: 04/14/2022 03:13 PM   Modules accepted: Orders

## 2022-04-14 NOTE — Patient Instructions (Signed)
Medication Instructions:  Your physician has recommended you make the following change in your medication:  Start Repatha injections- Inject into the skin every 14 days   Labwork: 4 months: NMR Lp(a)  Testing/Procedures: None  Follow-Up: Follow up with Dr. Rennis Golden in 4 months.   Any Other Special Instructions Will Be Listed Below (If Applicable).     If you need a refill on your cardiac medications before your next appointment, please call your pharmacy.

## 2022-04-16 ENCOUNTER — Other Ambulatory Visit: Payer: Self-pay

## 2022-04-16 ENCOUNTER — Ambulatory Visit: Payer: BC Managed Care – PPO | Admitting: Nurse Practitioner

## 2022-04-19 ENCOUNTER — Telehealth: Payer: Self-pay | Admitting: Internal Medicine

## 2022-04-19 MED ORDER — PRALUENT 150 MG/ML ~~LOC~~ SOAJ
1.0000 | SUBCUTANEOUS | 3 refills | Status: DC
Start: 2022-04-19 — End: 2022-07-22

## 2022-04-19 NOTE — Telephone Encounter (Signed)
PA for repatha not approved - praluent is preferred  PA for praluent 150mg /ml submitted via CMM Key: [id number]

## 2022-04-19 NOTE — Telephone Encounter (Signed)
Spoke to pharmacist and verbalized dose amount for praluent 150 mg/ml injections

## 2022-04-26 NOTE — Telephone Encounter (Signed)
Appt scheduled on 6/20 for 7/6

## 2022-04-29 ENCOUNTER — Ambulatory Visit (INDEPENDENT_AMBULATORY_CARE_PROVIDER_SITE_OTHER): Payer: BC Managed Care – PPO | Admitting: Adult Health

## 2022-04-29 ENCOUNTER — Encounter: Payer: Self-pay | Admitting: Adult Health

## 2022-04-29 DIAGNOSIS — G4700 Insomnia, unspecified: Secondary | ICD-10-CM | POA: Diagnosis not present

## 2022-04-29 DIAGNOSIS — F331 Major depressive disorder, recurrent, moderate: Secondary | ICD-10-CM

## 2022-04-29 DIAGNOSIS — F411 Generalized anxiety disorder: Secondary | ICD-10-CM

## 2022-04-29 DIAGNOSIS — F4310 Post-traumatic stress disorder, unspecified: Secondary | ICD-10-CM

## 2022-04-29 DIAGNOSIS — F319 Bipolar disorder, unspecified: Secondary | ICD-10-CM

## 2022-04-29 MED ORDER — ZOLPIDEM TARTRATE 10 MG PO TABS: ORAL_TABLET | ORAL | 2 refills | Status: DC

## 2022-04-29 MED ORDER — ALPRAZOLAM 0.5 MG PO TABS: 0.5000 mg | ORAL_TABLET | Freq: Three times a day (TID) | ORAL | 2 refills | Status: DC | PRN

## 2022-04-29 MED ORDER — BUPROPION HCL ER (XL) 150 MG PO TB24: 150.0000 mg | ORAL_TABLET | Freq: Every day | ORAL | 2 refills | Status: DC

## 2022-04-29 MED ORDER — FLUOXETINE HCL 40 MG PO CAPS: 40.0000 mg | ORAL_CAPSULE | Freq: Two times a day (BID) | ORAL | 5 refills | Status: DC

## 2022-04-29 NOTE — Progress Notes (Signed)
[name] [id number] 1966/06/23 56 y.o.  Subjective:   Patient ID:  [name] is a 56 y.o. (DOB [birth year]) female.  Chief Complaint: No chief complaint on file.   HPI [name] presents to the office today for follow-up of Bipolar 1 disorder, ADHD, MDD, GAD, PTSD, and insomnia.  Describes mood today as "ok". Pleasant. Mood symptoms - reports decreased depression, anxiety and irritability. Mood is consistent. Stating "I'm doing alright". Feels like medications are helpful. Working 3 nights a week - feels tired. Family doing well. Improved interest and motivation. Taking medications as prescribed.  Energy levels lower. Active, does not have a regular exercise routine.  Enjoys some usual interests and activities. Married. Lives with husband and 2 dogs. Has 2 grown children. Spending time with family. Appetite adequate. Weight gain - 147 pounds. Sleeping better some nights than others. 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 Focus and concentration - could be better - feels foggy all the time. Completing tasks. Managing aspects of household. Working 3 - 12 hour shifts.  Denies SI or HI.  Denies AH or VH.   Past trials of medication: Sertraline, Paxil, Fluoxetine, Lexapro, Wellbutrin, Abilify, Buspar   PHQ2-9    Flowsheet Row Office Visit from 11/24/2021 in [facility] Office Visit from 11/09/2021 in [facility] Office Visit from 03/31/2021 in [facility] Office Visit from 12/18/2020 in [facility]  PHQ-2 Total Score 0 0 0 6  PHQ-9 Total Score -- -- -- 11      Flowsheet Row ED to Hosp-Admission (Discharged) from 04/23/2021 in [facility] ED from 12/01/2020 in [facility] EM[facility]GENCY DEPARTMENT  C-SSRS RISK CATEGORY No Risk Error: Question 6 not populated        Review of Systems:  Review of Systems  Musculoskeletal:  Negative for gait problem.  Neurological:  Negative for tremors.   Psychiatric/Behavioral:         Please refer to HPI    Medications: I have reviewed the patient's current medications.  Current Outpatient Medications  Medication Sig Dispense Refill   buPROPion (WELLBUTRIN XL) 150 MG 24 hr tablet Take 1 tablet (150 mg total) by mouth daily. 30 tablet 2   Albuterol Sulfate (PROAIR RESPICLICK) 108 (90 Base) MCG/ACT AEPB Inhale 2 puffs into the lungs every 6 (six) hours as needed. 1 each 5   Alirocumab (PRALUENT) 150 MG/ML SOAJ Inject 1 Dose into the skin every 14 (fourteen) days. 6 mL 3   ALPRAZolam (XANAX) 0.5 MG tablet Take 1 tablet (0.5 mg total) by mouth 3 (three) times daily as needed. 90 tablet 2   aspirin EC 81 MG tablet Take 81 mg by mouth daily.     benzonatate (TESSALON) 100 MG capsule Take 1 capsule (100 mg total) by mouth 2 (two) times daily as needed for cough. 20 capsule 0   budesonide-formoterol (SYMBICORT) 160-4.5 MCG/ACT inhaler 1-2 puffs every 12 hours 1 each 11   Cholecalciferol (VITAMIN D3 PO) Take 1 tablet by mouth daily.     cyclobenzaprine (FLEXERIL) 10 MG tablet Take 10 mg by mouth 2 (two) times daily as needed for muscle spasms.      diphenoxylate-atropine (LOMOTIL) 2.5-0.025 MG tablet Take 2.5 mg by mouth daily as needed.      esomeprazole (NEXIUM) 40 MG capsule Take 1 capsule (40 mg total) by mouth daily. 30 capsule 0   FLUoxetine (PROZAC) 40 MG capsule Take 1 capsule (40 mg total) by mouth  2 (two) times daily. 60 capsule 5   fluticasone (FLONASE) 50 MCG/ACT nasal spray Place 2 sprays into both nostrils daily. 16 g 6   furosemide (LASIX) 40 MG tablet Take 1 tablet (40 mg total) by mouth daily. 30 tablet 0   gabapentin (NEURONTIN) 300 MG capsule Take 1 capsule by mouth 4 (four) times daily.     ibuprofen (ADVIL) 800 MG tablet Take 800 mg by mouth every 8 (eight) hours as needed.     losartan (COZAAR) 25 MG tablet Take 0.5 tablets (12.5 mg total) by mouth daily. 45 tablet 3   Multiple Vitamins-Minerals (CENTRUM SILVER PO) Take 1  tablet by mouth daily.     nitroGLYCERIN (NITROSTAT) 0.4 MG SL tablet Place 1 tablet (0.4 mg total) under the tongue every 5 (five) minutes x 3 doses as needed for chest pain (if no relief after 3rd dose, proceed to the ED for an evaluation). 25 tablet 3   ondansetron (ZOFRAN) 4 MG tablet Take 4 mg by mouth every 8 (eight) hours as needed.     potassium chloride (KLOR-CON) 20 MEQ packet Take 20 mEq by mouth 2 (two) times daily. 60 tablet 6   ranolazine (RANEXA) 1000 MG SR tablet Take 1 tablet (1,000 mg total) by mouth 2 (two) times daily. 180 tablet 3   rOPINIRole (REQUIP) 1 MG tablet Take 1 tablet (1 mg total) by mouth at bedtime as needed (for restless leg). 30 tablet 2   vitamin B-12 (CYANOCOBALAMIN) 100 MCG tablet Take 100 mcg by mouth daily.     zolpidem (AMBIEN) 10 MG tablet TAKE 1 TABLET BY MOUTH AT BEDTIME 30 tablet 2   No current facility-administered medications for this visit.    Medication Side Effects: None  Allergies:  Allergies  Allergen Reactions   Other    Imdur [Isosorbide Nitrate] Other (See Comments)    Headaches; Worsening Chest Pain    Past Medical History:  Diagnosis Date   ADD (attention deficit disorder with hyperactivity)    Anxiety    Bipolar 1 disorder (HCC)    Carotid artery disease (HCC)    Right 50-69% stenosis by doppler 2010   Chronic diastolic (congestive) heart failure (HCC)    COPD (chronic obstructive pulmonary disease) (HCC)    Phreesia 12/15/2020   Coronary artery disease    a. s/p CABG in 2009 with LIMA-LAD, SVG-RI, and SVG-RCA b. low-risk NST in 10/2015 c. low-risk NST in 12/2018.   CVA (cerebral infarction) 12/2008   Reports a neurologist told her she had a blood clot in her brain   Depression    Depression    Phreesia 12/15/2020   Emphysema of lung (HCC) 06/19/2018   Fibromyalgia    GERD (gastroesophageal reflux disease)    Hyperlipidemia    Hypertension    Myocardial infarction (HCC) 09/18/2011   Phreesia 12/15/2020   OSA on CPAP  12/31/2018   Sleep apnea    Phreesia 12/15/2020   Stroke [facility])    Phreesia 12/15/2020   Tobacco abuse     Past Medical History, Surgical history, Social history, and Family history were reviewed and updated as appropriate.   Please see review of systems for further details on the patient's review from today.   Objective:   Physical Exam:  LMP 10/22/2015   Physical Exam Constitutional:      General: She is not in acute distress. Musculoskeletal:        General: No deformity.  Neurological:     Mental Status: She  is alert and oriented to person, place, and time.     Coordination: Coordination normal.  Psychiatric:        Attention and Perception: Attention and perception normal. She does not perceive auditory or visual hallucinations.        Mood and Affect: Mood normal. Mood is not anxious or depressed. Affect is not labile, blunt, angry or inappropriate.        Speech: Speech normal.        Behavior: Behavior normal.        Thought Content: Thought content normal. Thought content is not paranoid or delusional. Thought content does not include homicidal or suicidal ideation. Thought content does not include homicidal or suicidal plan.        Cognition and Memory: Cognition and memory normal.        Judgment: Judgment normal.     Comments: Insight intact     Lab Review:     Component Value Date/Time   NA 140 11/20/2021 0810   K 4.2 11/20/2021 0810   CL 102 11/20/2021 0810   CO2 24 11/20/2021 0810   GLUCOSE 95 11/20/2021 0810   GLUCOSE 117 (H) 04/25/2021 0143   BUN 10 11/20/2021 0810   CREATININE 0.75 11/20/2021 0810   CALCIUM 9.4 11/20/2021 0810   PROT 7.0 11/20/2021 0810   ALBUMIN 4.4 11/20/2021 0810   AST 16 11/20/2021 0810   ALT 12 11/20/2021 0810   ALKPHOS 100 11/20/2021 0810   BILITOT 0.2 11/20/2021 0810   GFRNONAA >60 04/25/2021 0143   GFRAA >60 10/07/2019 1040       Component Value Date/Time   WBC 6.9 03/10/2022 0926   WBC 6.0 04/25/2021 0143   RBC  3.97 03/10/2022 0926   RBC 4.02 04/25/2021 0143   HGB 11.3 03/10/2022 0926   HCT 34.2 03/10/2022 0926   PLT 354 03/10/2022 0926   MCV 86 03/10/2022 0926   MCH 28.5 03/10/2022 0926   MCH 28.4 04/25/2021 0143   MCHC 33.0 03/10/2022 0926   MCHC 31.5 04/25/2021 0143   RDW 12.9 03/10/2022 0926   LYMPHSABS 3.9 (H) 03/10/2022 0926   MONOABS 0.4 12/01/2020 1324   EOSABS 0.1 03/10/2022 0926   BASOSABS 0.1 03/10/2022 0926    No results found for: "POCLITH", "LITHIUM"   No results found for: "PHENYTOIN", "PHENOBARB", "VALPROATE", "CBMZ"   .res Assessment: Plan:    Plan:  PDMP reviewed  Fluoxetine 80 mg daily  Xanax 0.5mg  TID prn anxiety - not taking 3 times a day Ambien 10mg  as needed Add Wellbutrin XL 150mg  - denies seizure history  Has seen a therapist in the past  RTC 6 months  Patient advised to contact office with any questions, adverse effects, or acute worsening in signs and symptoms.  Discussed potential benefits, risk, and side effects of benzodiazepines to include potential risk of tolerance and dependence, as well as possible drowsiness.  Advised patient not to drive if experiencing drowsiness and to take lowest possible effective dose to minimize risk of dependence and tolerance.  Discussed potential metabolic side effects associated with atypical antipsychotics, as well as potential risk for movement side effects. Advised pt to contact office if movement side effects occur.   Diagnoses and all orders for this visit:  PTSD (post-traumatic stress disorder)  Generalized anxiety disorder -     ALPRAZolam (XANAX) 0.5 MG tablet; Take 1 tablet (0.5 mg total) by mouth 3 (three) times daily as needed. -     Discontinue: FLUoxetine (PROZAC) 40  MG capsule; Take 1 capsule (40 mg total) by mouth 2 (two) times daily. -     FLUoxetine (PROZAC) 40 MG capsule; Take 1 capsule (40 mg total) by mouth 2 (two) times daily.  Major depressive disorder, recurrent episode, moderate  (HCC) -     Discontinue: FLUoxetine (PROZAC) 40 MG capsule; Take 1 capsule (40 mg total) by mouth 2 (two) times daily. -     buPROPion (WELLBUTRIN XL) 150 MG 24 hr tablet; Take 1 tablet (150 mg total) by mouth daily. -     FLUoxetine (PROZAC) 40 MG capsule; Take 1 capsule (40 mg total) by mouth 2 (two) times daily.  Insomnia, unspecified type -     zolpidem (AMBIEN) 10 MG tablet; TAKE 1 TABLET BY MOUTH AT BEDTIME  Bipolar I disorder (HCC)     Please see After Visit Summary for patient specific instructions.  Future Appointments  Date Time Provider Department Center  06/07/2022  8:45 AM Nyoka Cowden, MD [facility] None  06/10/2022  8:40 AM Kaidan Harpster, Thereasa Solo, NP [facility] None  09/02/2022 10:00 AM Antoine Poche, MD [email or web address]    No orders of the defined types were placed in this encounter.   -------------------------------

## 2022-05-07 ENCOUNTER — Other Ambulatory Visit: Payer: Self-pay

## 2022-05-07 DIAGNOSIS — Z122 Encounter for screening for malignant neoplasm of respiratory organs: Secondary | ICD-10-CM

## 2022-05-07 DIAGNOSIS — Z87891 Personal history of nicotine dependence: Secondary | ICD-10-CM

## 2022-05-23 ENCOUNTER — Other Ambulatory Visit: Payer: Self-pay | Admitting: Nurse Practitioner

## 2022-06-07 ENCOUNTER — Ambulatory Visit: Payer: BC Managed Care – PPO | Admitting: Internal Medicine

## 2022-06-07 NOTE — Progress Notes (Deleted)
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    History of Present Illness  03/10/2022  Pulmonary/ 1st office eval/ [name] / [name] Office on symbicort prn  Chief Complaint  Patient presents with   Consult    Prev Dr. Juanetta Gosling pt. SOB   Dyspnea:  feed the horse carries buckets half full a quarter mile slt downhill and stops half way on return trip typically does so around around 5 pm  Cough: some increase with pollen/assoc rhinitis (just this year first time she noted) Sleep: flat pillows   SABA use: proair and symbicort seem to help but make her heart flutter  HB on nexium at hs  Rec Plan A = Automatic = Always=    Symbicort 160  one - two puffs every 12 hours  Work on inhaler technique:  Nexium Take 30-60 min before a meal  Plan B = Backup (to supplement plan A, not to replace it) Only use your albuterol inhaler as a rescue medication to be used if you can't catch your breath Also Ok to try albuterol 15 min before an activity (on alternating days)  that you know would usually make you short of breath .  My office will be contacting you by phone for referral for lung cancer screening      Please schedule a follow up visit in 3 months but call sooner if needed with pfts    06/07/2022  f/u ov/[place] office/Sahib Pella re: MZ /GOLD 2 copd maint on ***  No chief complaint on file.   Dyspnea:  *** Cough: *** Sleeping: *** SABA use: *** 02: *** Covid status: *** Lung cancer screening: ***   No obvious day to day or daytime variability or assoc excess/ purulent sputum or mucus plugs or hemoptysis or cp or chest tightness, subjective wheeze or overt sinus or hb symptoms.   *** without nocturnal  or early am exacerbation  of respiratory  c/o's or need for noct saba. Also denies any  obvious fluctuation of symptoms with weather or environmental changes or other aggravating or alleviating factors except as outlined above   No unusual exposure hx or h/o childhood pna/ asthma or knowledge of premature birth.  Current Allergies, Complete Past Medical History, Past Surgical History, Family History, and Social History were reviewed in [employer] record.  ROS  The following are not active complaints unless bolded Hoarseness, sore throat, dysphagia, dental problems, itching, sneezing,  nasal congestion or discharge of excess mucus or purulent secretions, ear ache,   fever, chills, sweats, unintended wt loss or wt gain, classically pleuritic or exertional cp,  orthopnea pnd or arm/hand swelling  or leg swelling, presyncope, palpitations, abdominal pain, anorexia, nausea, vomiting, diarrhea  or change in bowel habits or change in bladder habits, change in stools or change in urine, dysuria, hematuria,  rash, arthralgias, visual complaints, headache, numbness, weakness or ataxia or problems with walking or coordination,  change in mood or  memory.        No outpatient medications have been marked as taking for the 06/07/22 encounter (Appointment) with Monica Cowden, MD.        Past Medical History:  Diagnosis Date   ADD (attention deficit disorder with hyperactivity)    Anxiety    Bipolar  1 disorder (HCC)    Carotid artery disease (HCC)    Right 50-69% stenosis by doppler 2010   Chronic diastolic (congestive) heart failure (HCC)    COPD (chronic obstructive pulmonary disease) (HCC)    Phreesia 12/15/2020   Coronary artery disease    a. s/p CABG in 2009 with LIMA-LAD, SVG-RI, and SVG-RCA b. low-risk NST in 10/2015 c. low-risk NST in 12/2018.   CVA (cerebral infarction) 12/2008   Reports a neurologist told her she had a blood clot in her brain   Depression    Depression    Phreesia 12/15/2020   Emphysema of lung (HCC) 06/19/2018   Fibromyalgia    GERD  (gastroesophageal reflux disease)    Hyperlipidemia    Hypertension    Myocardial infarction (HCC) 09/18/2011   Phreesia 12/15/2020   OSA on CPAP 12/31/2018   Sleep apnea    Phreesia 12/15/2020   Stroke (HCC)    Phreesia 12/15/2020   Tobacco abuse         Objective:     Wt Readings from Last 3 Encounters:  04/14/22 147 lb (66.7 kg)  03/10/22 146 lb 12.8 oz (66.6 kg)  03/02/22 145 lb (65.8 kg)      Vital signs reviewed  06/07/2022  - Note at rest 02 sats  ***% on ***   General appearance:    ***      Min bar ***   Labs ordered 03/10/2022  :  allergy scren  IgE = 20/ Eos 0.1   and  alpha one AT phenotype  88 MZ            Assessment

## 2022-06-10 ENCOUNTER — Ambulatory Visit (INDEPENDENT_AMBULATORY_CARE_PROVIDER_SITE_OTHER): Payer: BC Managed Care – PPO | Admitting: Acute Care

## 2022-06-10 ENCOUNTER — Encounter: Payer: Self-pay | Admitting: Acute Care

## 2022-06-10 ENCOUNTER — Ambulatory Visit: Payer: BC Managed Care – PPO | Admitting: Adult Health

## 2022-06-10 DIAGNOSIS — Z87891 Personal history of nicotine dependence: Secondary | ICD-10-CM

## 2022-06-10 NOTE — Progress Notes (Signed)
Virtual Visit via Telephone Note  I connected with [name] on 09/08/21 at  2:00 PM EST by telephone and verified that I am speaking with the correct person using two identifiers.  Location: Patient: Home Provider: Working from home   I discussed the limitations, risks, security and privacy concerns of performing an evaluation and management service by telephone and the availability of in person appointments. I also discussed with the patient that there may be a patient responsible charge related to this service. The patient expressed understanding and agreed to proceed.  Shared Decision Making Visit Lung Cancer Screening Program [phone])   Eligibility: Age 56 y.o. Pack Years Smoking History Calculation 38 (# packs/per year x # years smoked) Recent History of coughing up blood  no Unexplained weight loss? no ( >Than 15 pounds within the last 6 months ) Prior History Lung / other cancer no (Diagnosis within the last 5 years already requiring surveillance chest CT Scans). Smoking Status Former Smoker Former Smokers: Years since quit: 3 years  Quit Date: 2020  Visit Components: Discussion included one or more decision making aids. yes Discussion included risk/benefits of screening. yes Discussion included potential follow up diagnostic testing for abnormal scans. yes Discussion included meaning and risk of over diagnosis. yes Discussion included meaning and risk of False Positives. yes Discussion included meaning of total radiation exposure. yes  Counseling Included: Importance of adherence to annual lung cancer LDCT screening. yes Impact of comorbidities on ability to participate in the program. yes Ability and willingness to under diagnostic treatment. yes  Smoking Cessation Counseling: Current Smokers:  Discussed importance of smoking cessation. yes Information about tobacco cessation classes and interventions provided to patient. yes Patient provided with "ticket"  for LDCT Scan. yes Symptomatic Patient. yes  Counseling(Intermediate counseling: > three minutes) 99406 Diagnosis Code: Tobacco Use Z72.0 Asymptomatic Patient no  Counseling NA Former Smokers:  Discussed the importance of maintaining cigarette abstinence. yes Diagnosis Code: Personal History of Nicotine Dependence. O29.476 Information about tobacco cessation classes and interventions provided to patient. Yes Patient provided with "ticket" for LDCT Scan. yes Written Order for Lung Cancer Screening with LDCT placed in Epic. Yes (CT Chest Lung Cancer Screening Low Dose W/O CM) [id number] Z12.2-Screening of respiratory organs Z87.891-Personal history of nicotine dependence   I spent 25 minutes of face to face time with her discussing the risks and benefits of lung cancer screening. We viewed a power point together that explained in detail the above noted topics. We took the time to pause the power point at intervals to allow for questions to be asked and answered to ensure understanding. We discussed that she had taken the single most powerful action possible to decrease her risk of developing lung cancer when she quit smoking. I counseled her to remain smoke free, and to contact me if she ever had the desire to smoke again so that I can provide resources and tools to help support the effort to remain smoke free. We discussed the time and location of the scan, and that either  Abigail Miyamoto RN or I will call with the results within  24-48 hours of receiving them. She has my card and contact information in the event she needs to speak with me, in addition to a copy of the power point we reviewed as a resource. She verbalized understanding of all of the above and had no further questions upon leaving the office.     I explained to the patient that there has been  a high incidence of coronary artery disease noted on these exams. I explained that this is a non-gated exam therefore degree or severity cannot  be determined. This patient is not on statin therapy. I have asked the patient to follow-up with their PCP regarding any incidental finding of coronary artery disease and management with diet or medication as they feel is clinically indicated. The patient verbalized understanding of the above and had no further questions.   Angelin Cutrone D. Tiburcio Pea, NP-C [facility] Personal contact information can be found on Amion  06/10/2022, 8:31 AM

## 2022-06-10 NOTE — Patient Instructions (Signed)
Thank you for participating in the [facility] Cancer Screening Program. It was our pleasure to meet you today. We will call you with the results of your scan within the next few days. Your scan will be assigned a Lung RADS category score by the physicians reading the scans.  This Lung RADS score determines follow up scanning.  See below for description of categories, and follow up screening recommendations. We will be in touch to schedule your follow up screening annually or based on recommendations of our providers. We will fax a copy of your scan results to your Primary Care Physician, or the physician who referred you to the program, to ensure they have the results. Please call the office if you have any questions or concerns regarding your scanning experience or results.  Our office number is [phone]. Please speak with Denise Phelps, RN. , or  Denise Buckner RN, They are  our Lung Cancer Screening RN.'s If They are unavailable when you call, Please leave a message on the voice mail. We will return your call at our earliest convenience.This voice mail is monitored several times a day.  Remember, if your scan is normal, we will scan you annually as long as you continue to meet the criteria for the program. (Age 55-77, Current smoker or smoker who has quit within the last 15 years). If you are a smoker, remember, quitting is the single most powerful action that you can take to decrease your risk of lung cancer and other pulmonary, breathing related problems. We know quitting is hard, and we are here to help.  Please let us know if there is anything we can do to help you meet your goal of quitting. If you are a former smoker, congratulations. We are proud of you! Remain smoke free! Remember you can refer friends or family members through the number above.  We will screen them to make sure they meet criteria for the program. Thank you for helping us take better care of you by  participating in Lung Screening.  You can receive free nicotine replacement therapy ( patches, gum or mints) by calling 1-800-QUIT NOW. Please call so we can get you on the path to becoming  a non-smoker. I know it is hard, but you can do this!  Lung RADS Categories:  Lung RADS 1: no nodules or definitely non-concerning nodules.  Recommendation is for a repeat annual scan in 12 months.  Lung RADS 2:  nodules that are non-concerning in appearance and behavior with a very low likelihood of becoming an active cancer. Recommendation is for a repeat annual scan in 12 months.  Lung RADS 3: nodules that are probably non-concerning , includes nodules with a low likelihood of becoming an active cancer.  Recommendation is for a 6-month repeat screening scan. Often noted after an upper respiratory illness. We will be in touch to make sure you have no questions, and to schedule your 6-month scan.  Lung RADS 4 A: nodules with concerning findings, recommendation is most often for a follow up scan in 3 months or additional testing based on our provider's assessment of the scan. We will be in touch to make sure you have no questions and to schedule the recommended 3 month follow up scan.  Lung RADS 4 B:  indicates findings that are concerning. We will be in touch with you to schedule additional diagnostic testing based on our provider's  assessment of the scan.  Other options for assistance in smoking cessation (   As covered by your insurance benefits)  Hypnosis for smoking cessation  [employer] [phone]  Acupuncture for smoking cessation  [facility] [phone]   

## 2022-06-11 NOTE — Progress Notes (Signed)
scheduled

## 2022-06-18 ENCOUNTER — Ambulatory Visit (HOSPITAL_COMMUNITY)
Admission: RE | Admit: 2022-06-18 | Discharge: 2022-06-18 | Disposition: A | Discharge status: Home / Self Care | Payer: BC Managed Care – PPO | Source: Ambulatory Visit | Attending: Nurse Practitioner | Admitting: Nurse Practitioner

## 2022-06-18 DIAGNOSIS — J439 Emphysema, unspecified: Secondary | ICD-10-CM | POA: Diagnosis not present

## 2022-06-18 DIAGNOSIS — I700 Atherosclerosis of aorta: Secondary | ICD-10-CM | POA: Diagnosis not present

## 2022-06-18 DIAGNOSIS — Z122 Encounter for screening for malignant neoplasm of respiratory organs: Secondary | ICD-10-CM | POA: Diagnosis not present

## 2022-06-18 DIAGNOSIS — Z87891 Personal history of nicotine dependence: Secondary | ICD-10-CM | POA: Diagnosis not present

## 2022-06-21 ENCOUNTER — Other Ambulatory Visit: Payer: Self-pay | Admitting: Acute Care

## 2022-06-21 DIAGNOSIS — Z87891 Personal history of nicotine dependence: Secondary | ICD-10-CM

## 2022-06-21 DIAGNOSIS — Z122 Encounter for screening for malignant neoplasm of respiratory organs: Secondary | ICD-10-CM

## 2022-07-12 ENCOUNTER — Other Ambulatory Visit: Payer: Self-pay | Admitting: Nurse Practitioner

## 2022-07-16 ENCOUNTER — Ambulatory Visit: Payer: BC Managed Care – PPO | Admitting: Internal Medicine

## 2022-07-16 ENCOUNTER — Encounter: Payer: Self-pay | Admitting: Internal Medicine

## 2022-07-16 VITALS — BP 138/70 | Resp 57 | Ht 62.0 in | Wt 146.0 lb

## 2022-07-16 DIAGNOSIS — Z9989 Dependence on other enabling machines and devices: Secondary | ICD-10-CM

## 2022-07-16 DIAGNOSIS — F5102 Adjustment insomnia: Secondary | ICD-10-CM

## 2022-07-16 DIAGNOSIS — I5032 Chronic diastolic (congestive) heart failure: Secondary | ICD-10-CM

## 2022-07-16 DIAGNOSIS — G2581 Restless legs syndrome: Secondary | ICD-10-CM

## 2022-07-16 DIAGNOSIS — I25708 Atherosclerosis of coronary artery bypass graft(s), unspecified, with other forms of angina pectoris: Secondary | ICD-10-CM | POA: Diagnosis not present

## 2022-07-16 DIAGNOSIS — G4733 Obstructive sleep apnea (adult) (pediatric): Secondary | ICD-10-CM | POA: Diagnosis not present

## 2022-07-16 DIAGNOSIS — Z0001 Encounter for general adult medical examination with abnormal findings: Secondary | ICD-10-CM

## 2022-07-16 DIAGNOSIS — I10 Essential (primary) hypertension: Secondary | ICD-10-CM

## 2022-07-16 DIAGNOSIS — Z0000 Encounter for general adult medical examination without abnormal findings: Secondary | ICD-10-CM | POA: Insufficient documentation

## 2022-07-16 DIAGNOSIS — K219 Gastro-esophageal reflux disease without esophagitis: Secondary | ICD-10-CM

## 2022-07-16 DIAGNOSIS — F319 Bipolar disorder, unspecified: Secondary | ICD-10-CM

## 2022-07-16 DIAGNOSIS — Z2821 Immunization not carried out because of patient refusal: Secondary | ICD-10-CM

## 2022-07-16 DIAGNOSIS — F4310 Post-traumatic stress disorder, unspecified: Secondary | ICD-10-CM

## 2022-07-16 DIAGNOSIS — I639 Cerebral infarction, unspecified: Secondary | ICD-10-CM

## 2022-07-16 DIAGNOSIS — F419 Anxiety disorder, unspecified: Secondary | ICD-10-CM

## 2022-07-16 DIAGNOSIS — Z1211 Encounter for screening for malignant neoplasm of colon: Secondary | ICD-10-CM

## 2022-07-16 DIAGNOSIS — J449 Chronic obstructive pulmonary disease, unspecified: Secondary | ICD-10-CM

## 2022-07-16 DIAGNOSIS — E785 Hyperlipidemia, unspecified: Secondary | ICD-10-CM

## 2022-07-16 HISTORY — DX: Encounter for general adult medical examination without abnormal findings: Z00.00

## 2022-07-16 MED ORDER — ROPINIROLE HCL 1 MG PO TABS: 1.0000 mg | ORAL_TABLET | Freq: Every evening | ORAL | 2 refills | Status: DC | PRN

## 2022-07-16 NOTE — Patient Instructions (Signed)
It was a pleasure to see you today.  Thank you for giving [place] the opportunity to be involved in your care.  Below is a brief recap of your visit and next steps.  We will plan to see you again in 4 months  Summary We will check basic labs today I have refilled Requip I have ordered Cologuard again  Next steps Follow up in 4 months I recommend you return for your flu shot and other vaccines when you are able to

## 2022-07-16 NOTE — Assessment & Plan Note (Signed)
Symptoms well controlled with Nexium.

## 2022-07-16 NOTE — Assessment & Plan Note (Addendum)
Noncompliant with CPAP due to intolerance of mask.  She will follow pulmonology to further discuss this. -Counseled on importance of CPAP compliance.

## 2022-07-16 NOTE — Assessment & Plan Note (Addendum)
Managed by psychiatry.  She is prescribed Prozac, Wellbutrin, and Xanax

## 2022-07-16 NOTE — Assessment & Plan Note (Signed)
Followed by psychiatry.  Reports stable mood today

## 2022-07-16 NOTE — Assessment & Plan Note (Signed)
S/p CABGx3 in 2009.  Denies recent chest pain.  Followed by cardiology.  Has follow-up scheduled for November.  She is taking ASA 81 mg daily and ranolazine.  Statin intolerant but has started Praluent.  Unable to tolerate beta-blocker due to bradycardia. -No changes today.

## 2022-07-16 NOTE — Progress Notes (Signed)
follo

## 2022-07-16 NOTE — Assessment & Plan Note (Signed)
Followed by psychiatry.  Prescribed Prozac, Wellbutrin, and Xanax.

## 2022-07-16 NOTE — Progress Notes (Signed)
Established Patient Office Visit  Subjective   Patient ID: [name], female    DOB: [birth year]  Age: 56 y.o. MRN: [id number]  Chief Complaint  Patient presents with   Follow-up   [name] is a 55 year old woman returning today for follow-up.  She has a past medical history significant for CAD s/p CABG x3, CHF, HTN, prior CVA, HLD, COPD, OSA, GERD, anxiety, BPD1, PTSD, and RLS.  Last seen at [facility] by [name], NP for her annual exam on 11/24/2021.  In the interim, she has been seen and followed by cardiology, pulmonology, and psychiatry.  Today [name] states that she is feeling well.  She is asymptomatic and has no acute concerns.  She states that she is simply here for routine follow-up.  Chronic medical conditions and outstanding preventative healthcare maintenance items discussed today are individually addressed in A/P below.  Past Medical History:  Diagnosis Date   ADD (attention deficit disorder with hyperactivity)    Anxiety    Bipolar 1 disorder ([place])    Carotid artery disease ([place])    Right 50-69% stenosis by doppler 2010   Chronic diastolic (congestive) heart failure (HCC)    COPD (chronic obstructive pulmonary disease) ([place])    Phreesia 12/15/2020   Coronary artery disease    a. s/p CABG in 2009 with LIMA-LAD, SVG-RI, and SVG-RCA b. low-risk NST in 10/2015 c. low-risk NST in 12/2018.   CVA (cerebral infarction) 12/2008   Reports a neurologist told her she had a blood clot in her brain   Depression    Depression    Phreesia 12/15/2020   Emphysema of lung ([place]) 06/19/2018   Fibromyalgia    GERD (gastroesophageal reflux disease)    Hyperlipidemia    Hypertension    Myocardial infarction ([place]) 09/18/2011   Phreesia 12/15/2020   OSA on CPAP 12/31/2018   Sleep apnea    Phreesia 12/15/2020   Stroke ([place])    Phreesia 12/15/2020   Tobacco abuse    Past Surgical History:  Procedure Laterality Date   CARDIAC CATHETERIZATION     CORONARY ARTERY BYPASS  GRAFT  2009   3V CABG LIMA --> LAD, SVG --> [place],  SVG --> RCA   LEFT HEART CATH AND CORS/GRAFTS ANGIOGRAPHY N/A 04/24/2021   Procedure: LEFT HEART CATH AND CORS/GRAFTS ANGIOGRAPHY;  Surgeon: Burnell Blanks, MD;  Location: [facility];  Service: Cardiovascular;  Laterality: N/A;   TUBAL LIGATION  1990   Social History   Tobacco Use   Smoking status: Former    Packs/day: 1.00    Years: 38.00    Total pack years: 38.00    Types: Cigarettes    Start date: 12/14/1981    Quit date: 12/17/2018    Years since quitting: 3.5   Smokeless tobacco: Never  Vaping Use   Vaping Use: Never used  Substance Use Topics   Alcohol use: Not Currently    Comment: rarely- had a margarita on birthday   Drug use: No   Family History  Problem Relation Age of Onset   CAD Mother    Hypertension Mother    Stroke Mother    Heart disease Father    Diabetes type II Father    Intellectual disability Maternal Aunt    Intellectual disability Maternal Uncle    Alcohol abuse Paternal Grandfather    Colon cancer Neg Hx    Breast cancer Neg Hx    Lung cancer Neg Hx    Allergies  Allergen Reactions   Other    Imdur [Isosorbide Nitrate] Other (See Comments)    Headaches; Worsening Chest Pain      Review of Systems  Constitutional:  Negative for chills and fever.  HENT:  Negative for sore throat.   Respiratory:  Negative for cough and shortness of breath.   Cardiovascular:  Negative for chest pain, palpitations and leg swelling.  Gastrointestinal:  Negative for abdominal pain, blood in stool, constipation, diarrhea, nausea and vomiting.  Genitourinary:  Negative for dysuria and hematuria.  Musculoskeletal:  Negative for myalgias.  Skin:  Negative for itching and rash.  Neurological:  Negative for dizziness and headaches.  Psychiatric/Behavioral:  Negative for depression and suicidal ideas.      Objective:     BP 138/70 (BP Location: Left Arm)   Resp (!) 57   Ht 5\' 2"   (1.575 m)   Wt 146 lb (66.2 kg)   LMP 10/22/2015   SpO2 95%   BMI 26.70 kg/m  BP Readings from Last 3 Encounters:  07/16/22 138/70  04/14/22 130/70  03/10/22 128/70   Physical Exam Constitutional:      General: She is not in acute distress.    Appearance: Normal appearance. She is not toxic-appearing.  HENT:     Head: Normocephalic and atraumatic.     Right Ear: External ear normal.     Left Ear: External ear normal.     Nose: Nose normal. No congestion or rhinorrhea.     Mouth/Throat:     Mouth: Mucous membranes are moist.     Pharynx: Oropharynx is clear. No oropharyngeal exudate or posterior oropharyngeal erythema.  Eyes:     General: No scleral icterus.    Conjunctiva/sclera: Conjunctivae normal.     Pupils: Pupils are equal, round, and reactive to light.  Cardiovascular:     Rate and Rhythm: Regular rhythm. Bradycardia present.     Pulses: Normal pulses.     Heart sounds: Normal heart sounds.  Pulmonary:     Effort: Pulmonary effort is normal.     Breath sounds: Normal breath sounds. No wheezing, rhonchi or rales.  Abdominal:     General: Abdomen is flat. Bowel sounds are normal. There is no distension.     Palpations: Abdomen is soft.     Tenderness: There is no abdominal tenderness.  Musculoskeletal:        General: No swelling. Normal range of motion.     Cervical back: Normal range of motion.     Right lower leg: No edema.     Left lower leg: No edema.  Lymphadenopathy:     Cervical: No cervical adenopathy.  Skin:    General: Skin is warm and dry.     Capillary Refill: Capillary refill takes less than 2 seconds.     Coloration: Skin is not jaundiced.  Neurological:     General: No focal deficit present.     Mental Status: She is alert and oriented to person, place, and time.     Motor: No weakness.  Psychiatric:        Mood and Affect: Mood normal.        Behavior: Behavior normal.    Last CBC Lab Results  Component Value Date   WBC 6.9 03/10/2022    HGB 11.3 03/10/2022   HCT 34.2 03/10/2022   MCV 86 03/10/2022   MCH 28.5 03/10/2022   RDW 12.9 03/10/2022   PLT 354 03/10/2022   Last metabolic panel Lab Results  Component Value Date   GLUCOSE 95 11/20/2021   NA 140 11/20/2021   K 4.2 11/20/2021   CL 102 11/20/2021   CO2 24 11/20/2021   BUN 10 11/20/2021   CREATININE 0.75 11/20/2021   EGFR 94 11/20/2021   CALCIUM 9.4 11/20/2021   PROT 7.0 11/20/2021   ALBUMIN 4.4 11/20/2021   LABGLOB 2.6 11/20/2021   AGRATIO 1.7 11/20/2021   BILITOT 0.2 11/20/2021   ALKPHOS 100 11/20/2021   AST 16 11/20/2021   ALT 12 11/20/2021   ANIONGAP 9 04/25/2021   Last lipids Lab Results  Component Value Date   CHOL 235 (H) 11/20/2021   HDL 50 11/20/2021   LDLCALC 145 (H) 11/20/2021   TRIG 221 (H) 11/20/2021   CHOLHDL 4.7 (H) 11/20/2021   Last hemoglobin A1c Lab Results  Component Value Date   HGBA1C  10/08/2008    5.8 (NOTE)   The ADA recommends the following therapeutic goal for glycemic   control related to Hgb A1C measurement:   Goal of Therapy:   < 7.0% Hgb A1C   Reference: American Diabetes Association: Clinical Practice   Recommendations 2008, Diabetes Care,  2008, 31:(Suppl 1).   Last thyroid functions Lab Results  Component Value Date   TSH 2.100 03/10/2022   The ASCVD Risk score (Arnett DK, et al., 2019) failed to calculate for the following reasons:   The patient has a prior MI or stroke diagnosis    Assessment & Plan:   Problem List Items Addressed This Visit       Cardiovascular and Mediastinum   Essential hypertension - Primary    BP initially elevated but improved to 138/70 on repeat.  She is prescribed losartan 12.5 mg daily.  No changes today.      Stroke [facility])    History of 2 prior CVAs.  She endorses residual memory and balance issues. -Continue ASA and Praluent      CAD (coronary artery disease)    S/p CABGx3 in 2009.  Denies recent chest pain.  Followed by cardiology.  Has follow-up scheduled for  November.  She is taking ASA 81 mg daily and ranolazine.  Statin intolerant but has started Praluent.  Unable to tolerate beta-blocker due to bradycardia. -No changes today.      Chronic diastolic CHF (congestive heart failure) (HCC)    Euvolemic on exam today.  She has Lasix for as needed use.        Respiratory   OSA on CPAP    Noncompliant with CPAP due to intolerance of mask.  She will follow pulmonology to further discuss this. -Counseled on importance of CPAP compliance.      COPD GOLD 2 / MZ    Asymptomatic currently.  Unremarkable pulmonary exam today.  Followed by pulmonology.  Uses Symbicort daily and has albuterol for as needed use.  Has not needed to use albuterol recently.        Digestive   GERD    Symptoms well controlled with Nexium.        Other   RESTLESS LEG SYNDROME    Requip refilled today      Relevant Medications   rOPINIRole (REQUIP) 1 MG tablet   PTSD (post-traumatic stress disorder)    Followed by psychiatry.  Prescribed Prozac, Wellbutrin, and Xanax.      Anxiety    Managed by psychiatry.  She is prescribed Prozac, Wellbutrin, and Xanax      Bipolar 1 disorder ([place])    Followed by psychiatry.  Reports stable mood today      Insomnia    Managed by psychiatry and previously attributed to shift work.  She takes Ambien to help her sleep during the day. -Counseled on risk of taking Ambien and Xanax concomitantly.      Hyperlipidemia LDL goal <70    Statin intolerant.  Started on Praluent in June by cardiology.  Cardiology follow-up scheduled for November with repeat lipid panel ordered prior to her appointment.      Preventative health care    Presenting today for follow-up. -Basic labs ordered -She declined all outstanding vaccines today, and stating that this is her first weekend off in quite some time and she does not want to feel sore or unwell due to vaccination today.  She will return for vaccines later this fall. -Cologuard  ordered again today.  Previously ordered in January, but the patient lost the kit while attending to acute family issues. -She will follow-up with her OB/GYN in [facility] for Pap smear      Return in about 4 months (around 11/15/2022).    Johnette Abraham, MD

## 2022-07-16 NOTE — Assessment & Plan Note (Signed)
Asymptomatic currently.  Unremarkable pulmonary exam today.  Followed by pulmonology.  Uses Symbicort daily and has albuterol for as needed use.  Has not needed to use albuterol recently.

## 2022-07-16 NOTE — Assessment & Plan Note (Signed)
BP initially elevated but improved to 138/70 on repeat.  She is prescribed losartan 12.5 mg daily.  No changes today.

## 2022-07-16 NOTE — Assessment & Plan Note (Signed)
Statin intolerant.  Started on Praluent in June by cardiology.  Cardiology follow-up scheduled for November with repeat lipid panel ordered prior to her appointment.

## 2022-07-16 NOTE — Assessment & Plan Note (Signed)
Presenting today for follow-up. -Basic labs ordered -She declined all outstanding vaccines today, and stating that this is her first weekend off in quite some time and she does not want to feel sore or unwell due to vaccination today.  She will return for vaccines later this fall. -Cologuard ordered again today.  Previously ordered in January, but the patient lost the kit while attending to acute family issues. -She will follow-up with her OB/GYN in [place] for Pap smear

## 2022-07-16 NOTE — Assessment & Plan Note (Signed)
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  She takes Ambien to help her sleep during the day. -Counseled on risk of taking Ambien and Xanax concomitantly.

## 2022-07-16 NOTE — Assessment & Plan Note (Signed)
Euvolemic on exam today.  She has Lasix for as needed use.

## 2022-07-16 NOTE — Assessment & Plan Note (Signed)
History of 2 prior CVAs.  She endorses residual memory and balance issues. -Continue ASA and Praluent

## 2022-07-16 NOTE — Assessment & Plan Note (Signed)
Requip refilled today

## 2022-07-17 LAB — VITAMIN D 25 HYDROXY (VIT D DEFICIENCY, FRACTURES): Vit D, 25-Hydroxy: 56.5 ng/mL (ref 30.0–100.0)

## 2022-07-17 LAB — CMP14+EGFR
ALT: 14 IU/L (ref 0–32)
AST: 19 IU/L (ref 0–40)
Albumin/Globulin Ratio: 1.5 (ref 1.2–2.2)
Albumin: 4.9 g/dL (ref 3.8–4.9)
Alkaline Phosphatase: 111 IU/L (ref 44–121)
BUN/Creatinine Ratio: 14 (ref 9–23)
BUN: 10 mg/dL (ref 6–24)
Bilirubin Total: 0.2 mg/dL (ref 0.0–1.2)
CO2: 25 mmol/L (ref 20–29)
Calcium: 9.9 mg/dL (ref 8.7–10.2)
Chloride: 97 mmol/L (ref 96–106)
Creatinine, Ser: 0.73 mg/dL (ref 0.57–1.00)
Globulin, Total: 3.2 g/dL (ref 1.5–4.5)
Glucose: 99 mg/dL (ref 70–99)
Potassium: 4.1 mmol/L (ref 3.5–5.2)
Sodium: 137 mmol/L (ref 134–144)
Total Protein: 8.1 g/dL (ref 6.0–8.5)
eGFR: 96 mL/min/{1.73_m2} (ref >59)

## 2022-07-17 LAB — CBC WITH DIFFERENTIAL/PLATELET
Basophils Absolute: 0.1 10*3/uL (ref 0.0–0.2)
Basos: 1 %
EOS (ABSOLUTE): 0.1 10*3/uL (ref 0.0–0.4)
Eos: 2 %
Hematocrit: 38.7 % (ref 34.0–46.6)
Hemoglobin: 12.5 g/dL (ref 11.1–15.9)
Immature Grans (Abs): 0 10*3/uL (ref 0.0–0.1)
Immature Granulocytes: 0 %
Lymphocytes Absolute: 4.4 10*3/uL — ABNORMAL HIGH (ref 0.7–3.1)
Lymphs: 51 %
MCH: 27.8 pg (ref 26.6–33.0)
MCHC: 32.3 g/dL (ref 31.5–35.7)
MCV: 86 fL (ref 79–97)
Monocytes Absolute: 0.4 10*3/uL (ref 0.1–0.9)
Monocytes: 5 %
Neutrophils Absolute: 3.4 10*3/uL (ref 1.4–7.0)
Neutrophils: 41 %
Platelets: 378 10*3/uL (ref 150–450)
RBC: 4.5 x10E6/uL (ref 3.77–5.28)
RDW: 12.2 % (ref 11.7–15.4)
WBC: 8.4 10*3/uL (ref 3.4–10.8)

## 2022-07-17 LAB — B12 AND FOLATE PANEL
Folate: 11 ng/mL (ref >3.0)
Vitamin B-12: 484 pg/mL (ref 232–1245)

## 2022-07-17 LAB — HEMOGLOBIN A1C
Est. average glucose Bld gHb Est-mCnc: 128 mg/dL
Hgb A1c MFr Bld: 6.1 % — ABNORMAL HIGH (ref 4.8–5.6)

## 2022-07-20 ENCOUNTER — Other Ambulatory Visit: Payer: Self-pay

## 2022-07-20 DIAGNOSIS — Z1211 Encounter for screening for malignant neoplasm of colon: Secondary | ICD-10-CM

## 2022-07-21 ENCOUNTER — Encounter (HOSPITAL_BASED_OUTPATIENT_CLINIC_OR_DEPARTMENT_OTHER): Payer: Self-pay | Admitting: Internal Medicine

## 2022-07-22 MED ORDER — PRALUENT 150 MG/ML ~~LOC~~ SOAJ: 1.0000 | SUBCUTANEOUS | 2 refills | Status: DC

## 2022-07-28 MED ORDER — REPATHA SURECLICK 140 MG/ML ~~LOC~~ SOAJ: 1.0000 | SUBCUTANEOUS | 1 refills | Status: DC

## 2022-08-25 ENCOUNTER — Other Ambulatory Visit: Payer: Self-pay | Admitting: Nurse Practitioner

## 2022-09-02 ENCOUNTER — Ambulatory Visit: Payer: BC Managed Care – PPO | Admitting: Cardiology

## 2022-10-22 ENCOUNTER — Other Ambulatory Visit: Payer: Self-pay | Admitting: Adult Health

## 2022-10-22 DIAGNOSIS — G4700 Insomnia, unspecified: Secondary | ICD-10-CM

## 2022-10-22 NOTE — Telephone Encounter (Signed)
Last filled 12/24 x 14 days

## 2022-10-29 NOTE — Telephone Encounter (Signed)
Please call to schedule an appt, due this month.  

## 2022-10-29 NOTE — Telephone Encounter (Signed)
Needs an appt. Can send in enough until then.

## 2022-11-03 ENCOUNTER — Telehealth: Payer: Self-pay | Admitting: Cardiology

## 2022-11-03 ENCOUNTER — Telehealth: Payer: Self-pay | Admitting: Internal Medicine

## 2022-11-03 NOTE — Telephone Encounter (Signed)
Dental clearance   Noted  Copied Sleeved   In brown folder  Pt appt 11/15/22

## 2022-11-03 NOTE — Telephone Encounter (Signed)
Heather Mangano calling back in regards to clearance. See previous phone note 11/03/2022 She states she faxed a clearance form to [phone]. She says she has to have that form exact form filled out and sent back to her in order to see the patient. She would like a call back to make sure her form was received. Phone: [phone] Fax: [phone]

## 2022-11-03 NOTE — Telephone Encounter (Signed)
   Pre-operative Risk Assessment    Patient Name: [name]  DOB: 09/06/1966 MRN: [id number]     Request for Surgical Clearance    Procedure:  Dental Cleaning:  just to be seen at the office for a cleaning with a hand piece  Date of Surgery:  Clearance TBD                                 Surgeon:  Hezzie Bump  Surgeon's Group or Practice Name:  [facility] Phone number:  [phone] Fax number:  [phone]   Type of Clearance Requested:   - Medical    Type of Anesthesia:  requesting to know what kind of anesthesia patient can have   Additional requests/questions:    Signed, Annabell Sabal   11/03/2022, 12:32 PM

## 2022-11-03 NOTE — Telephone Encounter (Signed)
    Primary Cardiologist: Carlyle Dolly, MD  Chart reviewed as part of pre-operative protocol coverage. Simple dental extractions are considered low risk procedures per guidelines and generally do not require any specific cardiac clearance. It is also generally accepted that for simple extractions and dental cleanings, there is no need to interrupt blood thinner therapy.   SBE prophylaxis is not required for the patient.  I will route this recommendation to the requesting party via Epic fax function and remove from pre-op pool.  Please call with questions.  Elgie Collard, PA-C 11/03/2022, 12:50 PM

## 2022-11-04 NOTE — Telephone Encounter (Signed)
Pt has appt on 1/31

## 2022-11-05 NOTE — Telephone Encounter (Signed)
Nira Conn is following up. She states their office will need a fax stating that our office is unable to fax back their specific form, including answers to all of their questions.

## 2022-11-05 NOTE — Telephone Encounter (Signed)
I s/w Designer, television/film set. Pt is her stepmother that she is going to be doing a cleaning on. [name], states they did get the clearance notes 11/03/22 that we sent but it was not their form they sent. I explained that our cardiologist have a pre op protocol/format that they have been following for 5+ yrs. This protocol helps keep all crucial information streamlined into a concise note.   [name] said they only way she can do the cleaning on the pt is to have their clearance form sent back. I advise the best that we can do is make our notes onto the notes now that the clearance form has been scanned in to the chart.   I did ask about anesthesia as I looked at the 11/03/22 notes. Anesthesia may be local and they may need to use epi and want to know if that will be ok. Nicholes Rough, PAC did already note that there is no need for SBE (ABX).   Procedure is just a dental cleaning. I asked if cleaning was going to be a regular cleaning or deep/scaling cleaning. [name] states not sure yet. I again stated that I will re-present this to the pre op provider for their review.

## 2022-11-05 NOTE — Telephone Encounter (Signed)
Can we get this converted to our standard preop clearance.

## 2022-11-05 NOTE — Telephone Encounter (Signed)
   Primary Cardiologist: Carlyle Dolly, MD  Chart reviewed as part of pre-operative protocol coverage. Simple dental extractions are considered low risk procedures per guidelines and generally do not require any specific cardiac clearance. It is also generally accepted that for simple extractions and dental cleanings, there is no need to interrupt blood thinner therapy.   I do not have the form that was faxed in my possession. Our protocol is that the receiving office inputs the information and I am located in another location.   Patient has a history of coronary artery disease with previous CABG in 2009, normal LV function on echocardiogram, palpitations, COPD, hyperlipidemia, OSA on CPAP, and hypertension. She has been intolerant to statin medications and was referred to [facility] for consideration of non-statin therapy. At her last office visit on 03/02/22, she was started on low dose losartan for slightly elevated blood pressure. It was recommended that she return in 6 months for follow-up but we do not have record of an office visit in that time frame.   SBE prophylaxis is not required for the patient.  I will route this recommendation to the requesting party via Epic fax function and remove from pre-op pool.  Please call with questions.  Emmaline Life, NP-C  11/05/2022, 4:07 PM [address] Office [phone] Fax [phone]

## 2022-11-06 ENCOUNTER — Other Ambulatory Visit: Payer: Self-pay | Admitting: Adult Health

## 2022-11-06 DIAGNOSIS — F331 Major depressive disorder, recurrent, moderate: Secondary | ICD-10-CM

## 2022-11-06 DIAGNOSIS — F411 Generalized anxiety disorder: Secondary | ICD-10-CM

## 2022-11-08 NOTE — Telephone Encounter (Signed)
Last filled 12/24, due 1/21

## 2022-11-09 ENCOUNTER — Other Ambulatory Visit: Payer: Self-pay | Admitting: Internal Medicine

## 2022-11-15 ENCOUNTER — Ambulatory Visit: Payer: BC Managed Care – PPO | Admitting: Internal Medicine

## 2022-11-15 ENCOUNTER — Encounter: Payer: Self-pay | Admitting: Internal Medicine

## 2022-11-15 VITALS — BP 170/64 | HR 52 | Ht 62.0 in | Wt 147.4 lb

## 2022-11-15 DIAGNOSIS — Z1211 Encounter for screening for malignant neoplasm of colon: Secondary | ICD-10-CM

## 2022-11-15 DIAGNOSIS — I10 Essential (primary) hypertension: Secondary | ICD-10-CM | POA: Diagnosis not present

## 2022-11-15 DIAGNOSIS — Z2821 Immunization not carried out because of patient refusal: Secondary | ICD-10-CM

## 2022-11-15 DIAGNOSIS — E785 Hyperlipidemia, unspecified: Secondary | ICD-10-CM

## 2022-11-15 DIAGNOSIS — Z23 Encounter for immunization: Secondary | ICD-10-CM

## 2022-11-15 DIAGNOSIS — R7303 Prediabetes: Secondary | ICD-10-CM

## 2022-11-15 MED ORDER — LOSARTAN POTASSIUM 25 MG PO TABS: 25.0000 mg | ORAL_TABLET | Freq: Every day | ORAL | 1 refills | Status: DC

## 2022-11-15 NOTE — Assessment & Plan Note (Signed)
She is currently prescribed losartan 12.5 mg daily for treatment of hypertension.  Her blood pressure is significantly elevated today, 171/74 initially and 170/64 on repeat.  She states that because of recent changes at work and alternating shifts she is not taking losartan as consistently as she used to.  She last took losartan yesterday morning (1/21). -Increase losartan to 25 mg daily -Follow-up in 2 weeks for HTN check

## 2022-11-15 NOTE — Assessment & Plan Note (Signed)
Influenza vaccine administered today.

## 2022-11-15 NOTE — Patient Instructions (Signed)
It was a pleasure to see you today.  Thank you for giving [place] the opportunity to be involved in your care.  Below is a brief recap of your visit and next steps.  We will plan to see you again in 2-4 weeks.  Summary Increase losartan to 25 mg daily You will receive your flu shot today Follow up in 2-4 weeks.

## 2022-11-15 NOTE — Assessment & Plan Note (Signed)
Statin intolerant.  Managed by cardiology.  She reports that she was recently switched from [facility] to [place].

## 2022-11-15 NOTE — Assessment & Plan Note (Signed)
Cologuard ordered today °

## 2022-11-15 NOTE — Assessment & Plan Note (Signed)
A1c 6.1 in September.  Patient reports that she has been exercising more in light of this result.  She was again counseled on limiting fatty/fried foods.

## 2022-11-15 NOTE — Progress Notes (Signed)
Established Patient Office Visit  Subjective   Patient ID: [name], female    DOB: 1966/06/27  Age: 57 y.o. MRN: [id number]  Chief Complaint  Patient presents with   Hypertension    Follow up   [name] returns to care today.  She was last seen by me on 07/16/22.  No medication changes were made and 58-month follow-up was arranged. Today [name] reports feeling well.  She is asymptomatic and has no acute concerns to discuss.  She states that her work schedule was recently changed and she is alternating shifts.  She also reports that she had COVID-19 several weeks ago.  Past Medical History:  Diagnosis Date   ADD (attention deficit disorder with hyperactivity)    Anxiety    Bipolar 1 disorder ([place])    Carotid artery disease ([place])    Right 50-69% stenosis by doppler 2010   Chest pain 04/23/2021   Chronic diastolic (congestive) heart failure (HCC)    COPD (chronic obstructive pulmonary disease) ([place])    Phreesia 12/15/2020   Coronary artery disease    a. s/p CABG in 2009 with LIMA-LAD, SVG-RI, and SVG-RCA b. low-risk NST in 10/2015 c. low-risk NST in 12/2018.   CVA (cerebral infarction) 12/2008   Reports a neurologist told her she had a blood clot in her brain   Depression    Depression    Phreesia 12/15/2020   Emphysema of lung ([place]) 06/19/2018   Fibromyalgia    GERD (gastroesophageal reflux disease)    Hyperlipidemia    Hypertension    Myocardial infarction ([place]) 09/18/2011   Phreesia 12/15/2020   OSA on CPAP 12/31/2018   Preventative health care 07/16/2022   Sleep apnea    Phreesia 12/15/2020   Stroke ([place])    Phreesia 12/15/2020   Tobacco abuse    Upper respiratory tract infection 11/09/2021   Past Surgical History:  Procedure Laterality Date   CARDIAC CATHETERIZATION     CORONARY ARTERY BYPASS GRAFT  2009   3V CABG LIMA --> LAD, SVG --> [place] Intermediate,  SVG --> RCA   LEFT HEART CATH AND CORS/GRAFTS ANGIOGRAPHY N/A 04/24/2021   Procedure: LEFT  HEART CATH AND CORS/GRAFTS ANGIOGRAPHY;  Surgeon: Burnell Blanks, MD;  Location: [facility];  Service: Cardiovascular;  Laterality: N/A;   TUBAL LIGATION  1990   Social History   Tobacco Use   Smoking status: Former    Packs/day: 1.00    Years: 38.00    Total pack years: 38.00    Types: Cigarettes    Start date: 12/14/1981    Quit date: 12/17/2018    Years since quitting: 3.9   Smokeless tobacco: Never  Vaping Use   Vaping Use: Never used  Substance Use Topics   Alcohol use: Not Currently    Comment: rarely- had a margarita on birthday   Drug use: No   Family History  Problem Relation Age of Onset   CAD Mother    Hypertension Mother    Stroke Mother    Heart disease Father    Diabetes type II Father    Intellectual disability Maternal Aunt    Intellectual disability Maternal Uncle    Alcohol abuse Paternal Grandfather    Colon cancer Neg Hx    Breast cancer Neg Hx    Lung cancer Neg Hx    Allergies  Allergen Reactions   Other    Imdur [Isosorbide Nitrate] Other (See Comments)    Headaches; Worsening Chest Pain  Review of Systems  Constitutional:  Negative for chills and fever.  HENT:  Negative for sore throat.   Respiratory:  Negative for cough and shortness of breath.   Cardiovascular:  Negative for chest pain, palpitations and leg swelling.  Gastrointestinal:  Negative for abdominal pain, blood in stool, constipation, diarrhea, nausea and vomiting.  Genitourinary:  Negative for dysuria and hematuria.  Musculoskeletal:  Negative for myalgias.  Skin:  Negative for itching and rash.  Neurological:  Negative for dizziness and headaches.  Psychiatric/Behavioral:  Negative for depression and suicidal ideas.      Objective:     BP (!) 170/64   Pulse (!) 52   Ht 5\' 2"  (1.575 m)   Wt 147 lb 6.4 oz (66.9 kg)   LMP 10/22/2015   SpO2 96%   BMI 26.96 kg/m  BP Readings from Last 3 Encounters:  11/15/22 (!) 170/64  07/16/22 138/70  04/14/22  130/70   Physical Exam Vitals reviewed.  Constitutional:      General: She is not in acute distress.    Appearance: Normal appearance. She is not toxic-appearing.  HENT:     Head: Normocephalic and atraumatic.     Right Ear: External ear normal.     Left Ear: External ear normal.     Nose: Nose normal. No congestion or rhinorrhea.     Mouth/Throat:     Mouth: Mucous membranes are moist.     Pharynx: Oropharynx is clear. No oropharyngeal exudate or posterior oropharyngeal erythema.  Eyes:     General: No scleral icterus.    Conjunctiva/sclera: Conjunctivae normal.     Pupils: Pupils are equal, round, and reactive to light.  Cardiovascular:     Rate and Rhythm: Normal rate and regular rhythm.     Pulses: Normal pulses.     Heart sounds: Normal heart sounds.  Pulmonary:     Effort: Pulmonary effort is normal.     Breath sounds: Normal breath sounds. No wheezing, rhonchi or rales.  Abdominal:     General: Abdomen is flat. Bowel sounds are normal. There is no distension.     Palpations: Abdomen is soft.     Tenderness: There is no abdominal tenderness.  Musculoskeletal:        General: No swelling. Normal range of motion.     Cervical back: Normal range of motion.     Right lower leg: No edema.     Left lower leg: No edema.  Lymphadenopathy:     Cervical: No cervical adenopathy.  Skin:    General: Skin is warm and dry.     Capillary Refill: Capillary refill takes less than 2 seconds.     Coloration: Skin is not jaundiced.  Neurological:     General: No focal deficit present.     Mental Status: She is alert and oriented to person, place, and time.     Motor: No weakness.  Psychiatric:        Mood and Affect: Mood normal.        Behavior: Behavior normal.   Last CBC Lab Results  Component Value Date   WBC 8.4 07/16/2022   HGB 12.5 07/16/2022   HCT 38.7 07/16/2022   MCV 86 07/16/2022   MCH 27.8 07/16/2022   RDW 12.2 07/16/2022   PLT 378 07/16/2022   Last metabolic  panel Lab Results  Component Value Date   GLUCOSE 99 07/16/2022   NA 137 07/16/2022   K 4.1 07/16/2022   CL 97 07/16/2022  CO2 25 07/16/2022   BUN 10 07/16/2022   CREATININE 0.73 07/16/2022   EGFR 96 07/16/2022   CALCIUM 9.9 07/16/2022   PROT 8.1 07/16/2022   ALBUMIN 4.9 07/16/2022   LABGLOB 3.2 07/16/2022   AGRATIO 1.5 07/16/2022   BILITOT 0.2 07/16/2022   ALKPHOS 111 07/16/2022   AST 19 07/16/2022   ALT 14 07/16/2022   ANIONGAP 9 04/25/2021   Last lipids Lab Results  Component Value Date   CHOL 235 (H) 11/20/2021   HDL 50 11/20/2021   LDLCALC 145 (H) 11/20/2021   TRIG 221 (H) 11/20/2021   CHOLHDL 4.7 (H) 11/20/2021   Last hemoglobin A1c Lab Results  Component Value Date   HGBA1C 6.1 (H) 07/16/2022   Last thyroid functions Lab Results  Component Value Date   TSH 2.100 03/10/2022   Last vitamin D Lab Results  Component Value Date   VD25OH 56.5 07/16/2022   Last vitamin B12 and Folate Lab Results  Component Value Date   VITAMINB12 484 07/16/2022   FOLATE 11.0 07/16/2022     Assessment & Plan:   Problem List Items Addressed This Visit       Essential hypertension    She is currently prescribed losartan 12.5 mg daily for treatment of hypertension.  Her blood pressure is significantly elevated today, 171/74 initially and 170/64 on repeat.  She states that because of recent changes at work and alternating shifts she is not taking losartan as consistently as she used to.  She last took losartan yesterday morning (1/21). -Increase losartan to 25 mg daily -Follow-up in 2 weeks for HTN check      Hyperlipidemia LDL goal <70    Statin intolerant.  Managed by cardiology.  She reports that she was recently switched from [place] to [place].      Screening for colon cancer - Primary    Cologuard ordered today      Prediabetes    A1c 6.1 in September.  Patient reports that she has been exercising more in light of this result.  She was again counseled on  limiting fatty/fried foods.      Need for influenza vaccination    Influenza vaccine administered today      Return in about 2 weeks (around 11/29/2022) for HTN.    Johnette Abraham, MD

## 2022-11-24 ENCOUNTER — Ambulatory Visit: Payer: BC Managed Care – PPO | Admitting: Adult Health

## 2022-12-03 ENCOUNTER — Ambulatory Visit: Payer: BC Managed Care – PPO | Admitting: Internal Medicine

## 2022-12-03 ENCOUNTER — Encounter: Payer: Self-pay | Admitting: Internal Medicine

## 2022-12-10 ENCOUNTER — Encounter: Payer: Self-pay | Admitting: Cardiology

## 2022-12-10 ENCOUNTER — Ambulatory Visit: Payer: BC Managed Care – PPO | Attending: Cardiology | Admitting: Cardiology

## 2022-12-10 VITALS — BP 118/58 | HR 56 | Ht 62.0 in | Wt 143.0 lb

## 2022-12-10 DIAGNOSIS — E782 Mixed hyperlipidemia: Secondary | ICD-10-CM | POA: Diagnosis not present

## 2022-12-10 DIAGNOSIS — I10 Essential (primary) hypertension: Secondary | ICD-10-CM

## 2022-12-10 DIAGNOSIS — I2510 Atherosclerotic heart disease of native coronary artery without angina pectoris: Secondary | ICD-10-CM | POA: Diagnosis not present

## 2022-12-10 DIAGNOSIS — G4733 Obstructive sleep apnea (adult) (pediatric): Secondary | ICD-10-CM

## 2022-12-10 MED ORDER — FUROSEMIDE 40 MG PO TABS: 40.0000 mg | ORAL_TABLET | Freq: Every day | ORAL | 3 refills | Status: AC

## 2022-12-10 NOTE — Patient Instructions (Addendum)
Medication Instructions:  Your physician recommends that you continue on your current medications as directed. Please refer to the Current Medication list given to you today.   Labwork: Fasting Lipid Panel BMET MAG  Testing/Procedures: None  Follow-Up: Follow up with Dr. Harl Bowie in 6 months.   Any Other Special Instructions Will Be Listed Below (If Applicable).  You have been referred to Pulmonology for Sleep Apnea. They will contact you with your first appointment.    If you need a refill on your cardiac medications before your next appointment, please call your pharmacy.

## 2022-12-10 NOTE — Progress Notes (Signed)
Clinical Summary Ms. Dubuque is a 57 y.o.female seen today for follow up of the following medical problems.    1. CAD - prior CABG in 2009 with LIMA-LAD, SVG-RI, SVG-RCA - 12/2018 echo LVEF 60-65%, no significant abnormalities - 12/2018 nuclear stress: no ischemia -not on beta blockers due to bradycardia. Intolerant to imdur      04/2021 echo LVEF 65-70%, no WMAs  04/2021 cath: patent LAD, LCX. RCA is CTO. Atretic LIMA-LAD, patent SVG-RCA graft. Chronic occluded SVG-OM1   -no chest pains, no SOB/DOE -compliant with meds       2. Palpitations - few times a day, just a few seconds - typically feels when she is resting.  - coffee x 2, Dr peppers, tea   - no recent palpitations.      3. Chronic diastolic HF - no SOB/DOE - some LE edema at times, takes lasix about 3 nights a week.    4. COPD - compliant with inhalers - upcoming appt with Dr Melvyn Novas   5. HTN - compliant with meds   6. Hyperlipidemia - she is on statin - Jan 2023 TC 235 TG 221 HDL 50 LDL 145 - stopped crestor, was causing joint pains.  - reports has tried multiple statins, all caused muscle aches  - seen in lipid clinic - started on repatha Jan 2023 TC 235 TG221 LDL 145    7. OSA on cpap - no using cpap   SH: works at [employer]     Past Medical History:  Diagnosis Date   ADD (attention deficit disorder with hyperactivity)    Anxiety    Bipolar 1 disorder ([place])    Carotid artery disease ([place])    Right 50-69% stenosis by doppler 2010   Chest pain 04/23/2021   Chronic diastolic (congestive) heart failure (HCC)    COPD (chronic obstructive pulmonary disease) ([place])    Phreesia 12/15/2020   Coronary artery disease    a. s/p CABG in 2009 with LIMA-LAD, SVG-RI, and SVG-RCA b. low-risk NST in 10/2015 c. low-risk NST in 12/2018.   CVA (cerebral infarction) 12/2008   Reports a neurologist told her she had a blood clot in her brain   Depression    Depression    Phreesia 12/15/2020   Emphysema of  lung ([place]) 06/19/2018   Fibromyalgia    GERD (gastroesophageal reflux disease)    Hyperlipidemia    Hypertension    Myocardial infarction ([place]) 09/18/2011   Phreesia 12/15/2020   OSA on CPAP 12/31/2018   Preventative health care 07/16/2022   Sleep apnea    Phreesia 12/15/2020   Stroke ([place])    Phreesia 12/15/2020   Tobacco abuse    Upper respiratory tract infection 11/09/2021     Allergies  Allergen Reactions   Other    Imdur [Isosorbide Nitrate] Other (See Comments)    Headaches; Worsening Chest Pain     Current Outpatient Medications  Medication Sig Dispense Refill   Albuterol Sulfate (PROAIR RESPICLICK) 123XX123 (90 Base) MCG/ACT AEPB Inhale 2 puffs into the lungs every 6 (six) hours as needed. 1 each 5   ALPRAZolam (XANAX) 0.5 MG tablet TAKE 1 TABLET BY MOUTH THREE TIMES DAILY AS NEEDED 39 tablet 0   aspirin EC 81 MG tablet Take 81 mg by mouth daily.     benzonatate (TESSALON) 100 MG capsule Take 1 capsule (100 mg total) by mouth 2 (two) times daily as needed for cough. 20 capsule 0   budesonide-formoterol (SYMBICORT) 160-4.5 MCG/ACT  inhaler 1-2 puffs every 12 hours 1 each 11   buPROPion (WELLBUTRIN XL) 150 MG 24 hr tablet TAKE 1 TABLET BY MOUTH DAILY 30 tablet 0   Cholecalciferol (VITAMIN D3 PO) Take 1 tablet by mouth daily.     cyclobenzaprine (FLEXERIL) 10 MG tablet Take 10 mg by mouth 2 (two) times daily as needed for muscle spasms.      diphenoxylate-atropine (LOMOTIL) 2.5-0.025 MG tablet Take 2.5 mg by mouth daily as needed.      esomeprazole (NEXIUM) 40 MG capsule TAKE ONE CAPSULE BY MOUTH DAILY 30 capsule 0   Evolocumab (REPATHA SURECLICK) XX123456 MG/ML SOAJ Inject 1 Dose into the skin every 14 (fourteen) days. 6 mL 1   FLUoxetine (PROZAC) 40 MG capsule Take 1 capsule (40 mg total) by mouth 2 (two) times daily. 60 capsule 5   fluticasone (FLONASE) 50 MCG/ACT nasal spray Place 2 sprays into both nostrils daily. 16 g 6   furosemide (LASIX) 40 MG tablet Take 1 tablet (40 mg  total) by mouth daily. 30 tablet 0   gabapentin (NEURONTIN) 300 MG capsule Take 1 capsule by mouth 4 (four) times daily.     ibuprofen (ADVIL) 800 MG tablet Take 800 mg by mouth every 8 (eight) hours as needed.     losartan (COZAAR) 25 MG tablet Take 1 tablet (25 mg total) by mouth daily. 90 tablet 1   Multiple Vitamins-Minerals (CENTRUM SILVER PO) Take 1 tablet by mouth daily.     nitroGLYCERIN (NITROSTAT) 0.4 MG SL tablet Place 1 tablet (0.4 mg total) under the tongue every 5 (five) minutes x 3 doses as needed for chest pain (if no relief after 3rd dose, proceed to the ED for an evaluation). 25 tablet 3   ondansetron (ZOFRAN) 4 MG tablet Take 4 mg by mouth every 8 (eight) hours as needed.     potassium chloride (KLOR-CON) 20 MEQ packet Take 20 mEq by mouth 2 (two) times daily. 60 tablet 6   ranolazine (RANEXA) 1000 MG SR tablet Take 1 tablet (1,000 mg total) by mouth 2 (two) times daily. 180 tablet 3   rOPINIRole (REQUIP) 1 MG tablet Take 1 tablet (1 mg total) by mouth at bedtime as needed (for restless leg). 30 tablet 2   vitamin B-12 (CYANOCOBALAMIN) 100 MCG tablet Take 100 mcg by mouth daily.     zolpidem (AMBIEN) 10 MG tablet TAKE 1 TABLET BY MOUTH AT BEDTIME 20 tablet 0   No current facility-administered medications for this visit.     Past Surgical History:  Procedure Laterality Date   CARDIAC CATHETERIZATION     CORONARY ARTERY BYPASS GRAFT  2009   3V CABG LIMA --> LAD, SVG --> [place] Intermediate,  SVG --> RCA   LEFT HEART CATH AND CORS/GRAFTS ANGIOGRAPHY N/A 04/24/2021   Procedure: LEFT HEART CATH AND CORS/GRAFTS ANGIOGRAPHY;  Surgeon: Burnell Blanks, MD;  Location: [facility];  Service: Cardiovascular;  Laterality: N/A;   TUBAL LIGATION  1990     Allergies  Allergen Reactions   Other    Imdur [Isosorbide Nitrate] Other (See Comments)    Headaches; Worsening Chest Pain      Family History  Problem Relation Age of Onset   CAD Mother    Hypertension  Mother    Stroke Mother    Heart disease Father    Diabetes type II Father    Intellectual disability Maternal Aunt    Intellectual disability Maternal Uncle    Alcohol abuse Paternal Grandfather  Colon cancer Neg Hx    Breast cancer Neg Hx    Lung cancer Neg Hx      Social History Ms. Cummiskey reports that she quit smoking about 3 years ago. Her smoking use included cigarettes. She started smoking about 41 years ago. She has a 38.00 pack-year smoking history. She has never used smokeless tobacco. Ms. Mccleskey reports that she does not currently use alcohol.   Review of Systems CONSTITUTIONAL: No weight loss, fever, chills, weakness or fatigue.  HEENT: Eyes: No visual loss, blurred vision, double vision or yellow sclerae.No hearing loss, sneezing, congestion, runny nose or sore throat.  SKIN: No rash or itching.  CARDIOVASCULAR: per hpi RESPIRATORY: No shortness of breath, cough or sputum.  GASTROINTESTINAL: No anorexia, nausea, vomiting or diarrhea. No abdominal pain or blood.  GENITOURINARY: No burning on urination, no polyuria NEUROLOGICAL: No headache, dizziness, syncope, paralysis, ataxia, numbness or tingling in the extremities. No change in bowel or bladder control.  MUSCULOSKELETAL: No muscle, back pain, joint pain or stiffness.  LYMPHATICS: No enlarged nodes. No history of splenectomy.  PSYCHIATRIC: No history of depression or anxiety.  ENDOCRINOLOGIC: No reports of sweating, cold or heat intolerance. No polyuria or polydipsia.  Marland Kitchen   Physical Examination Today's Vitals   12/10/22 0914  BP: (!) 118/58  Pulse: (!) 56  SpO2: 97%  Weight: 143 lb (64.9 kg)  Height: 5' 2"$  (1.575 m)   Body mass index is 26.16 kg/m.  Gen: resting comfortably, no acute distress HEENT: no scleral icterus, pupils equal round and reactive, no palptable cervical adenopathy,  CV: RRR, no m/r/g no jvd Resp: Clear to auscultation bilaterally GI: abdomen is soft, non-tender, non-distended,  normal bowel sounds, no hepatosplenomegaly MSK: extremities are warm, no edema.  Skin: warm, no rash Neuro:  no focal deficits Psych: appropriate affect   Diagnostic Studies  Echocardiogram: 12/2018  IMPRESSIONS      1. The left ventricle has normal systolic function with an ejection fraction of 60-65%. The cavity size was normal. Left ventricular diastolic parameters were normal No evidence of left ventricular regional wall motion abnormalities.  2. The mitral valve is normal in structure.  3. The tricuspid valve is normal in structure.  4. The aortic valve is tricuspid.  5. The aortic root is normal in size and structure.  6. No evidence of left ventricular regional wall motion abnormalities.   NST: 01/09/2019 Blood pressure demonstrated a hypertensive response to exercise. Horizontal ST segment depression ST segment depression of 0.5 mm was noted during recovery in the II, III, aVF, V5 and V6 leads. The patient was asymptomatic. The study is normal. No perfusion defects consistent with myocardial ischemia or scar. This is a low risk study. Nuclear stress EF: 71%.     04/2021 echo 1. Left ventricular ejection fraction, by estimation, is 65 to 70%. The  left ventricle has normal function. The left ventricle has no regional  wall motion abnormalities. Left ventricular diastolic parameters were  normal.   2. Right ventricular systolic function is normal. The right ventricular  size is mildly enlarged. Tricuspid regurgitation signal is inadequate for  assessing PA pressure.   3. The mitral valve is grossly normal. Trivial mitral valve  regurgitation.   4. The aortic valve is tricuspid. Aortic valve regurgitation is not  visualized. Aortic valve mean gradient measures 4.0 mmHg.   5. The inferior vena cava is normal in size with greater than 50%  respiratory variability, suggesting right atrial pressure of 3 mmHg.  Assessment and Plan   1. CAD - denies any symptoms,  continue current meds       2. HTN - bp is at goal, continue current meds   3.Hyperlipidemia - intolerant to statins, now on repatha.FOllowed in lipid clinic - repeat lipid panel  4. Chronic HFpEF - continue lasix, recheck bmet/mg  5. OSA - cpap is uncomfortable, no wearing at home - refer to pulmonary for evaluation. Perhaps possible adjustments to cpap or considerations for inspire device if she may qualify.    F/u 6 months  Arnoldo Lenis, M.D.

## 2022-12-30 ENCOUNTER — Ambulatory Visit: Payer: BC Managed Care – PPO | Admitting: Adult Health

## 2022-12-30 ENCOUNTER — Encounter: Payer: Self-pay | Admitting: Adult Health

## 2022-12-30 DIAGNOSIS — F331 Major depressive disorder, recurrent, moderate: Secondary | ICD-10-CM

## 2022-12-30 DIAGNOSIS — F4310 Post-traumatic stress disorder, unspecified: Secondary | ICD-10-CM

## 2022-12-30 DIAGNOSIS — G4700 Insomnia, unspecified: Secondary | ICD-10-CM

## 2022-12-30 DIAGNOSIS — F319 Bipolar disorder, unspecified: Secondary | ICD-10-CM

## 2022-12-30 DIAGNOSIS — F411 Generalized anxiety disorder: Secondary | ICD-10-CM | POA: Diagnosis not present

## 2022-12-30 MED ORDER — BUPROPION HCL ER (XL) 150 MG PO TB24: 150.0000 mg | ORAL_TABLET | Freq: Every day | ORAL | 5 refills | Status: DC

## 2022-12-30 MED ORDER — ZOLPIDEM TARTRATE 10 MG PO TABS: 10.0000 mg | ORAL_TABLET | Freq: Every day | ORAL | 2 refills | Status: DC

## 2022-12-30 MED ORDER — ALPRAZOLAM 0.5 MG PO TABS: 0.5000 mg | ORAL_TABLET | Freq: Three times a day (TID) | ORAL | 2 refills | Status: DC | PRN

## 2022-12-30 MED ORDER — FLUOXETINE HCL 40 MG PO CAPS: 40.0000 mg | ORAL_CAPSULE | Freq: Two times a day (BID) | ORAL | 5 refills | Status: DC

## 2022-12-30 NOTE — Progress Notes (Signed)
TAJANE ABUD [id number] 1966-07-05 57 y.o.  Subjective:   Patient ID:  [name] is a 57 y.o. (DOB 01/10/1966) female.  Chief Complaint: No chief complaint on file.   HPI [name] presents to the office today for follow-up of Bipolar 1 disorder, ADHD, GAD, PTSD, and insomnia.  Describes mood today as "ok". Pleasant. Mood symptoms - reports "some" depression, anxiety and irritability - without medications. Mood is consistent. Stating "I'm doing alright". Feels like medications are helpful. Family doing well. Improved interest and motivation. Taking medications as prescribed.  Energy levels lower. Active, does not have a regular exercise routine.  Enjoys some usual interests and activities. Married. Lives with husband and 2 dogs - parrots. Has 2 grown children. Spending time with family. Appetite adequate. Weight gain - 143 pounds. Sleeping better some nights than others. 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 Focus and concentration improved. Completing tasks. Managing aspects of household. Working at [employer] 2,2,3 - 12 hour shifts.  Denies SI or HI.  Denies AH or VH.  Denies self harm. Denies substance use.  Past trials of medication: Sertraline, Paxil, Fluoxetine, Lexapro, Wellbutrin, Abilify, Buspar   GAD-7    Flowsheet Row Office Visit from 11/15/2022 in [facility]  Total GAD-7 Score 2      PHQ2-9    [place] Office Visit from 11/15/2022 in [facility] Office Visit from 07/16/2022 in [facility] Office Visit from 11/24/2021 in [facility] Office Visit from 11/09/2021 in [facility] Office Visit from 03/31/2021 in [facility]  PHQ-2 Total Score 1 0 0 0 0  PHQ-9 Total Score 5 -- -- -- --      Flowsheet Row ED to Hosp-Admission (Discharged) from 04/23/2021 in [facility] ED from 12/01/2020 in [facility] Emergency Department at [place] No Risk Error: Question 6 not populated        Review of Systems:  Review of Systems  Musculoskeletal:  Negative for gait problem.  Neurological:  Negative for tremors.  Psychiatric/Behavioral:         Please refer to HPI    Medications: I have reviewed the patient's current medications.  Current Outpatient Medications  Medication Sig Dispense Refill   Albuterol Sulfate (PROAIR RESPICLICK) 123XX123 (90 Base) MCG/ACT AEPB Inhale 2 puffs into the lungs every 6 (six) hours as needed. 1 each 5   ALPRAZolam (XANAX) 0.5 MG tablet Take 1 tablet (0.5 mg total) by mouth 3 (three) times daily as needed. 90 tablet 2   aspirin EC 81 MG tablet Take 81 mg by mouth daily.     benzonatate (TESSALON) 100 MG capsule Take 1 capsule (100 mg total) by mouth 2 (two) times daily as needed for cough. 20 capsule 0   budesonide-formoterol (SYMBICORT) 160-4.5 MCG/ACT inhaler 1-2 puffs every 12 hours 1 each 11   buPROPion (WELLBUTRIN XL) 150 MG 24 hr tablet Take 1 tablet (150 mg total) by mouth daily. 30 tablet 5   Cholecalciferol (VITAMIN D3 PO) Take 1 tablet by mouth daily.     cyclobenzaprine (FLEXERIL) 10 MG tablet Take 10 mg by mouth 2 (two) times daily as needed for muscle spasms.      diphenoxylate-atropine (LOMOTIL) 2.5-0.025 MG tablet Take 2.5 mg by mouth daily as needed.      esomeprazole (NEXIUM) 40 MG capsule TAKE ONE  CAPSULE BY MOUTH DAILY 30 capsule 0   Evolocumab (REPATHA SURECLICK) XX123456 MG/ML SOAJ Inject 1 Dose into the skin every 14 (fourteen) days. 6 mL 1   FLUoxetine (PROZAC) 40 MG capsule Take 1 capsule (40 mg total) by mouth 2 (two) times daily. 60 capsule 5   fluticasone (FLONASE) 50 MCG/ACT nasal spray Place 2 sprays into both nostrils daily. 16 g 6   furosemide (LASIX) 40 MG tablet Take 1 tablet (40 mg total) by mouth daily. 90 tablet 3   ibuprofen (ADVIL) 800 MG tablet Take 800 mg by mouth every 8 (eight) hours as needed.      losartan (COZAAR) 25 MG tablet Take 1 tablet (25 mg total) by mouth daily. 90 tablet 1   Multiple Vitamins-Minerals (CENTRUM SILVER PO) Take 1 tablet by mouth daily.     nitroGLYCERIN (NITROSTAT) 0.4 MG SL tablet Place 1 tablet (0.4 mg total) under the tongue every 5 (five) minutes x 3 doses as needed for chest pain (if no relief after 3rd dose, proceed to the ED for an evaluation). 25 tablet 3   ondansetron (ZOFRAN) 4 MG tablet Take 4 mg by mouth every 8 (eight) hours as needed.     potassium chloride (KLOR-CON) 20 MEQ packet Take 20 mEq by mouth 2 (two) times daily. 60 tablet 6   ranolazine (RANEXA) 1000 MG SR tablet Take 1 tablet (1,000 mg total) by mouth 2 (two) times daily. 180 tablet 3   rOPINIRole (REQUIP) 1 MG tablet Take 1 tablet (1 mg total) by mouth at bedtime as needed (for restless leg). 30 tablet 2   vitamin B-12 (CYANOCOBALAMIN) 100 MCG tablet Take 100 mcg by mouth daily.     zolpidem (AMBIEN) 10 MG tablet Take 1 tablet (10 mg total) by mouth at bedtime. 30 tablet 2   No current facility-administered medications for this visit.    Medication Side Effects: None  Allergies:  Allergies  Allergen Reactions   Other    Imdur [Isosorbide Nitrate] Other (See Comments)    Headaches; Worsening Chest Pain    Past Medical History:  Diagnosis Date   ADD (attention deficit disorder with hyperactivity)    Anxiety    Bipolar 1 disorder ([place])    Carotid artery disease ([place])    Right 50-69% stenosis by doppler 2010   Chest pain 04/23/2021   Chronic diastolic (congestive) heart failure (HCC)    COPD (chronic obstructive pulmonary disease) ([place])    Phreesia 12/15/2020   Coronary artery disease    a. s/p CABG in 2009 with LIMA-LAD, SVG-RI, and SVG-RCA b. low-risk NST in 10/2015 c. low-risk NST in 12/2018.   CVA (cerebral infarction) 12/2008   Reports a neurologist told her she had a blood clot in her brain   Depression    Depression    Phreesia 12/15/2020   Emphysema of lung  ([place]) 06/19/2018   Fibromyalgia    GERD (gastroesophageal reflux disease)    Hyperlipidemia    Hypertension    Myocardial infarction ([place]) 09/18/2011   Phreesia 12/15/2020   OSA on CPAP 12/31/2018   Preventative health care 07/16/2022   Sleep apnea    Phreesia 12/15/2020   Stroke [facility])    Phreesia 12/15/2020   Tobacco abuse    Upper respiratory tract infection 11/09/2021    Past Medical History, Surgical history, Social history, and Family history were reviewed and updated as appropriate.   Please see review of systems for further details on the patient's review from today.  Objective:   Physical Exam:  LMP 10/22/2015   Physical Exam Constitutional:      General: She is not in acute distress. Musculoskeletal:        General: No deformity.  Neurological:     Mental Status: She is alert and oriented to person, place, and time.     Coordination: Coordination normal.  Psychiatric:        Attention and Perception: Attention and perception normal. She does not perceive auditory or visual hallucinations.        Mood and Affect: Mood normal. Mood is not anxious or depressed. Affect is not labile, blunt, angry or inappropriate.        Speech: Speech normal.        Behavior: Behavior normal.        Thought Content: Thought content normal. Thought content is not paranoid or delusional. Thought content does not include homicidal or suicidal ideation. Thought content does not include homicidal or suicidal plan.        Cognition and Memory: Cognition and memory normal.        Judgment: Judgment normal.     Comments: Insight intact     Lab Review:     Component Value Date/Time   NA 137 07/16/2022 0953   K 4.1 07/16/2022 0953   CL 97 07/16/2022 0953   CO2 25 07/16/2022 0953   GLUCOSE 99 07/16/2022 0953   GLUCOSE 117 (H) 04/25/2021 0143   BUN 10 07/16/2022 0953   CREATININE 0.73 07/16/2022 0953   CALCIUM 9.9 07/16/2022 0953   PROT 8.1 07/16/2022 0953   ALBUMIN 4.9  07/16/2022 0953   AST 19 07/16/2022 0953   ALT 14 07/16/2022 0953   ALKPHOS 111 07/16/2022 0953   BILITOT 0.2 07/16/2022 0953   GFRNONAA >60 04/25/2021 0143   GFRAA >60 10/07/2019 1040       Component Value Date/Time   WBC 8.4 07/16/2022 0953   WBC 6.0 04/25/2021 0143   RBC 4.50 07/16/2022 0953   RBC 4.02 04/25/2021 0143   HGB 12.5 07/16/2022 0953   HCT 38.7 07/16/2022 0953   PLT 378 07/16/2022 0953   MCV 86 07/16/2022 0953   MCH 27.8 07/16/2022 0953   MCH 28.4 04/25/2021 0143   MCHC 32.3 07/16/2022 0953   MCHC 31.5 04/25/2021 0143   RDW 12.2 07/16/2022 0953   LYMPHSABS 4.4 (H) 07/16/2022 0953   MONOABS 0.4 12/01/2020 1324   EOSABS 0.1 07/16/2022 0953   BASOSABS 0.1 07/16/2022 0953    No results found for: "POCLITH", "LITHIUM"   No results found for: "PHENYTOIN", "PHENOBARB", "VALPROATE", "CBMZ"   .res Assessment: Plan:    Plan:  PDMP reviewed  Fluoxetine 80 mg daily  Xanax 0.'5mg'$  TID prn anxiety - not taking 3 times a day Ambien '10mg'$  as needed Wellbutrin XL '150mg'$  - denies seizure history  Has seen a therapist in the past  RTC 1 year  Patient advised to contact office with any questions, adverse effects, or acute worsening in signs and symptoms.  Discussed potential benefits, risk, and side effects of benzodiazepines to include potential risk of tolerance and dependence, as well as possible drowsiness.  Advised patient not to drive if experiencing drowsiness and to take lowest possible effective dose to minimize risk of dependence and tolerance.  Discussed potential metabolic side effects associated with atypical antipsychotics, as well as potential risk for movement side effects. Advised pt to contact office if movement side effects occur.  Diagnoses and all orders for this  visit:  Insomnia, unspecified type -     zolpidem (AMBIEN) 10 MG tablet; Take 1 tablet (10 mg total) by mouth at bedtime.  Major depressive disorder, recurrent episode, moderate (HCC) -      FLUoxetine (PROZAC) 40 MG capsule; Take 1 capsule (40 mg total) by mouth 2 (two) times daily. -     buPROPion (WELLBUTRIN XL) 150 MG 24 hr tablet; Take 1 tablet (150 mg total) by mouth daily.  Generalized anxiety disorder -     FLUoxetine (PROZAC) 40 MG capsule; Take 1 capsule (40 mg total) by mouth 2 (two) times daily. -     ALPRAZolam (XANAX) 0.5 MG tablet; Take 1 tablet (0.5 mg total) by mouth 3 (three) times daily as needed.     Please see After Visit Summary for patient specific instructions.  Future Appointments  Date Time Provider [place]  06/15/2023  8:20 AM Arnoldo Lenis, MD [id number]    No orders of the defined types were placed in this encounter.   -------------------------------

## 2023-01-19 ENCOUNTER — Ambulatory Visit: Payer: BC Managed Care – PPO | Admitting: Cardiology

## 2023-01-21 ENCOUNTER — Other Ambulatory Visit: Payer: Self-pay | Admitting: Internal Medicine

## 2023-01-28 IMAGING — DX DG CHEST 2V
2 series · 2 of 2 positions shown · non-contrast
Comparison: None.

CLINICAL DATA: Chest pain, sob, nausea since 4 am, COPD Pos covid
12/01/20 chest pain

EXAM:
CHEST - 2 VIEW

[chest pa]
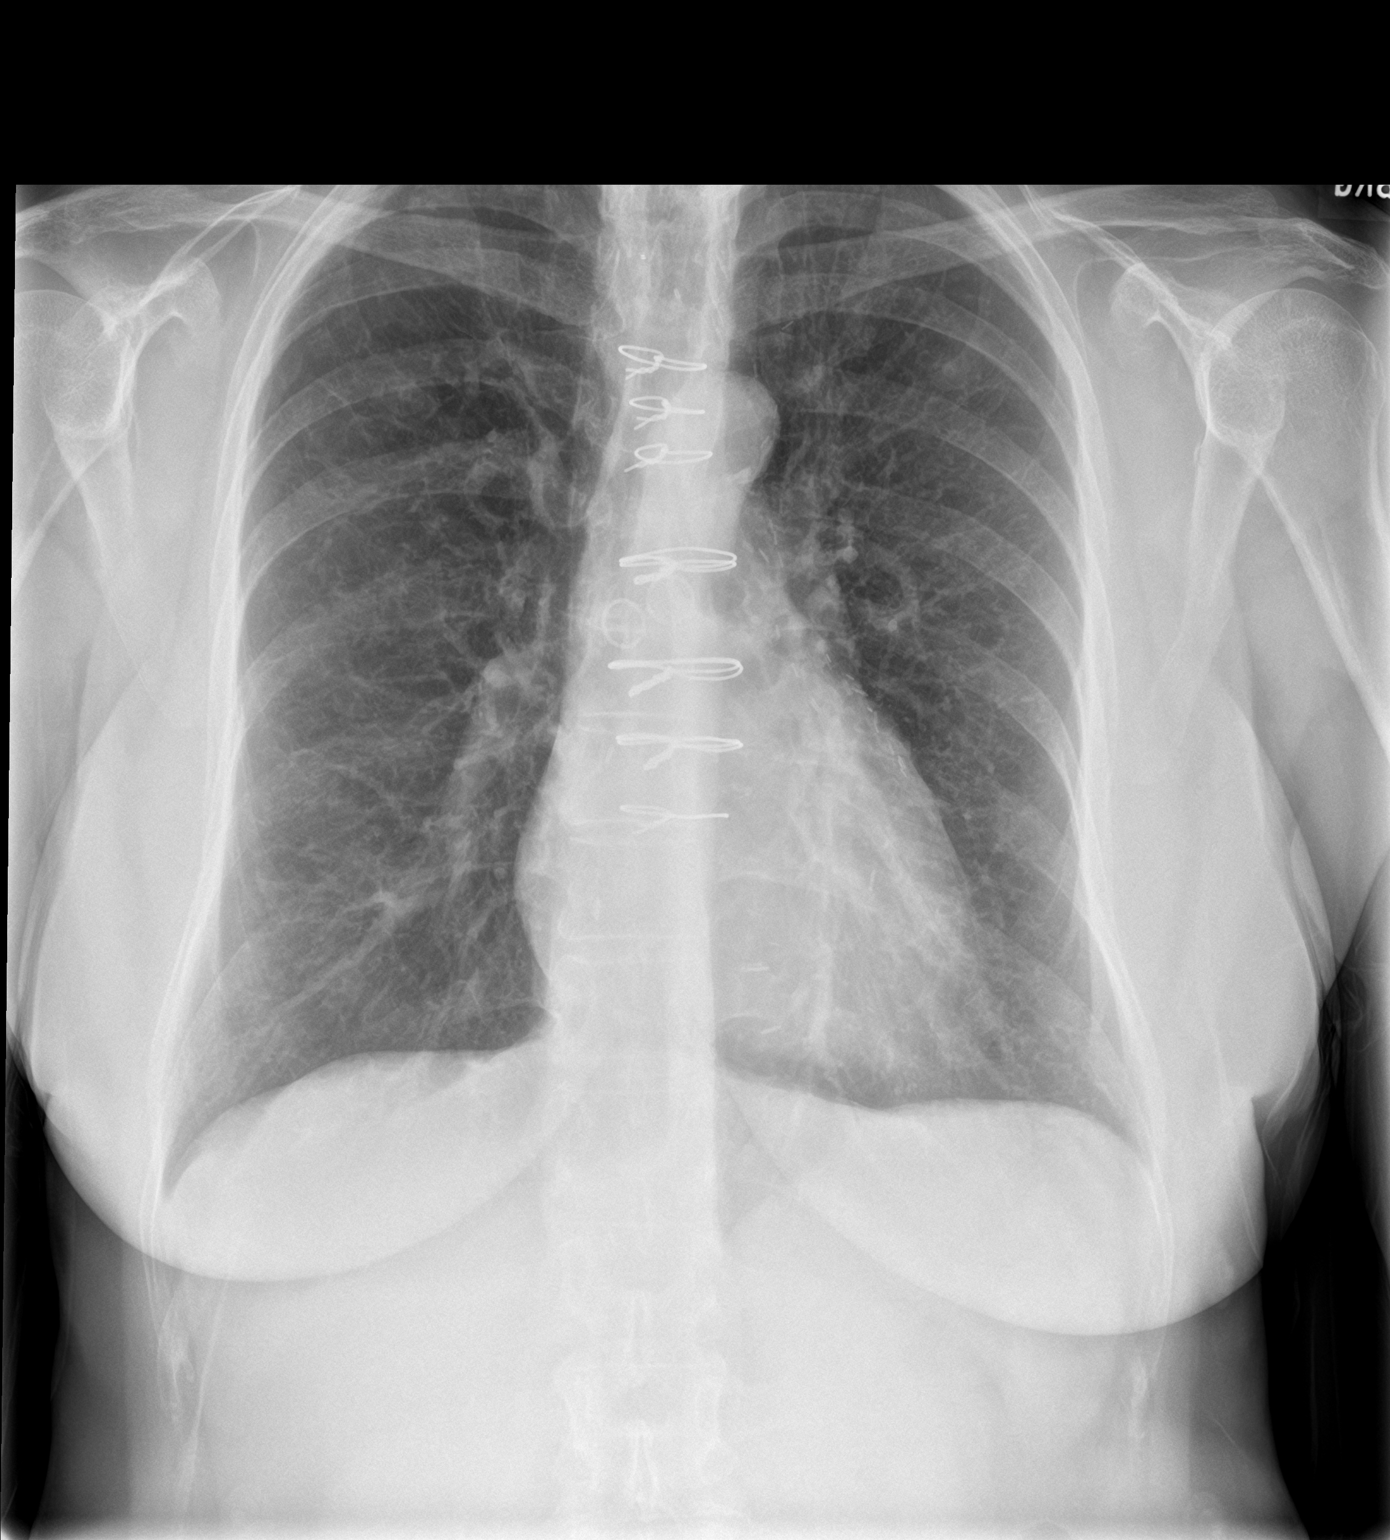

[chest lat]
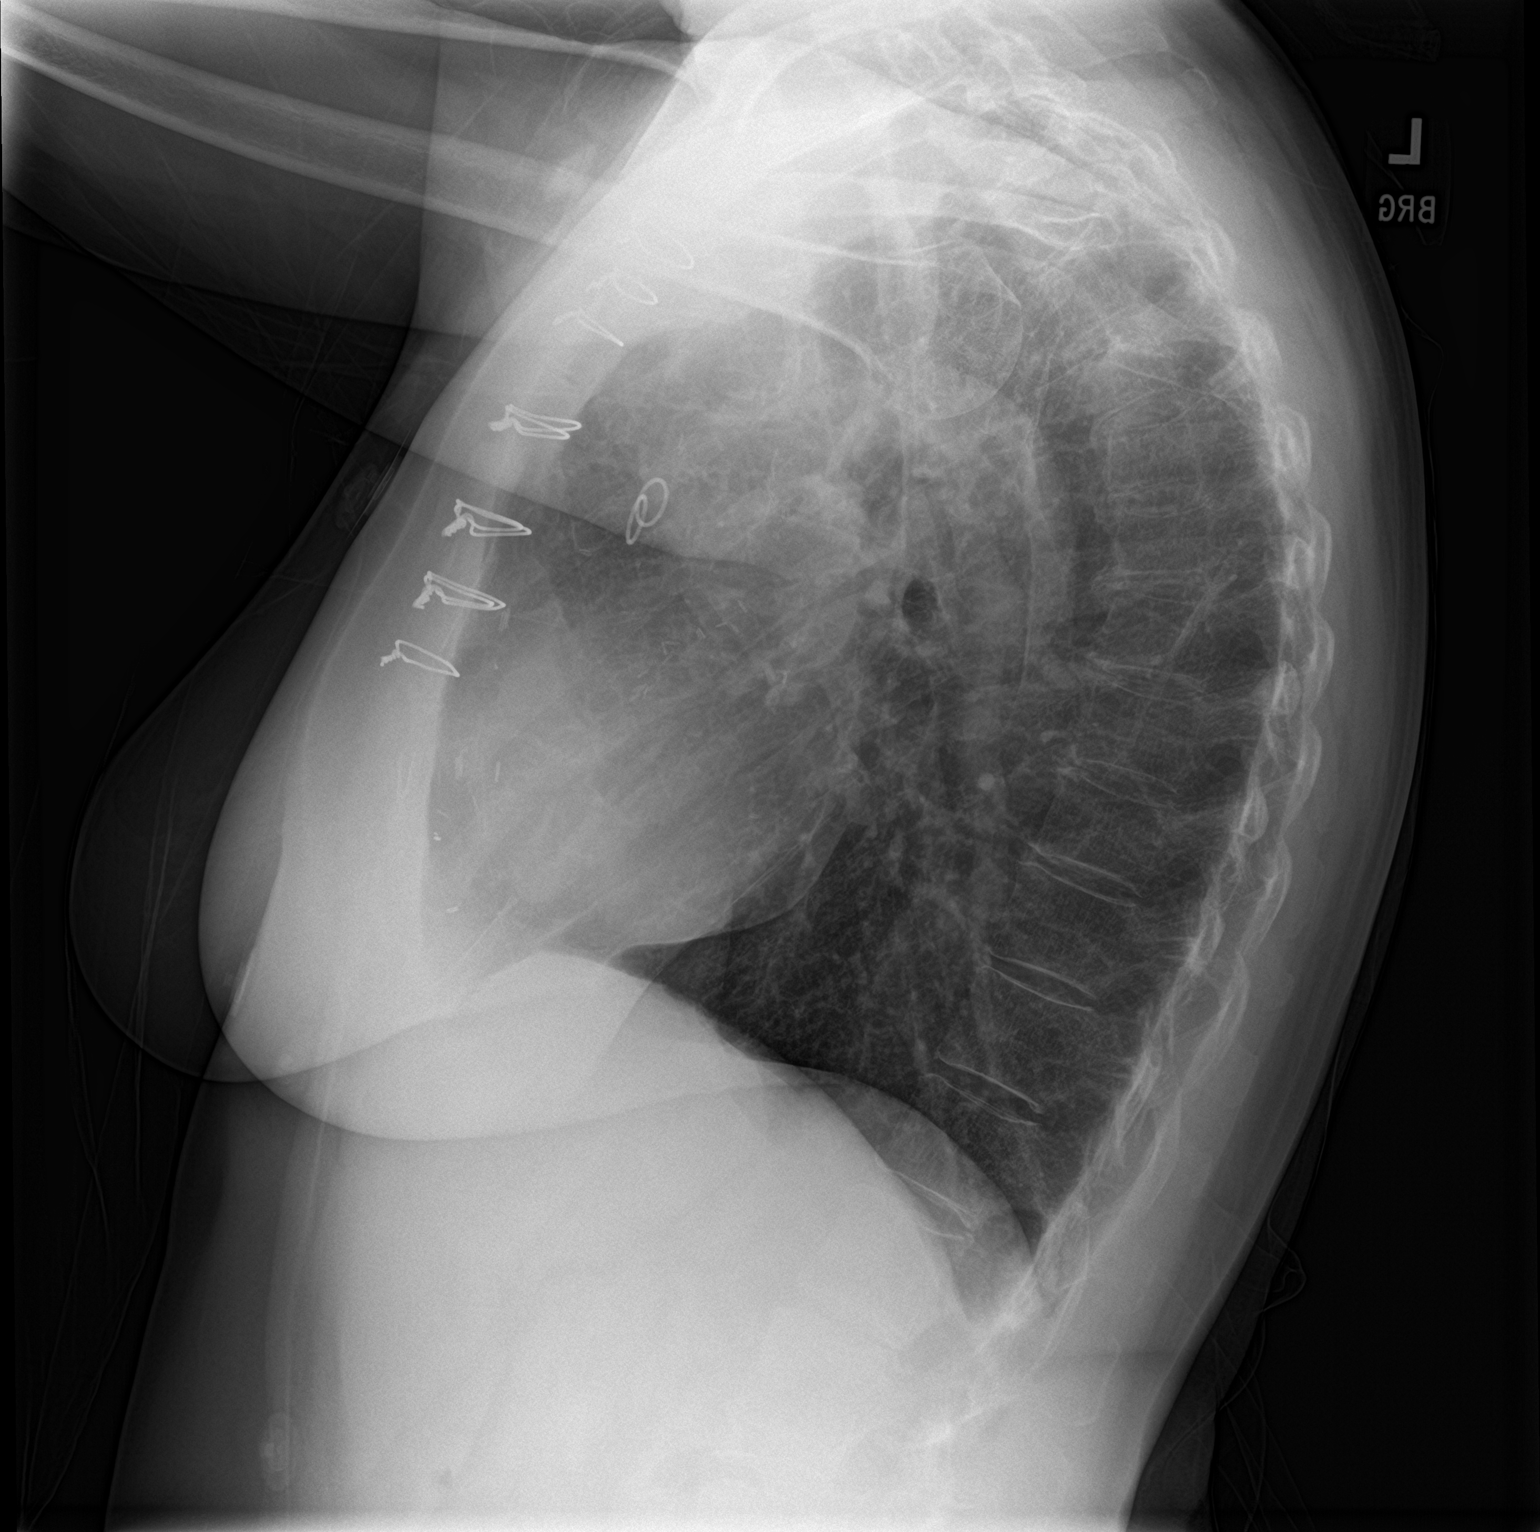

[2 of 2 positions shown; findings below may reference images not displayed]

FINDINGS: Sternotomy wires overlie normal cardiac silhouette. Normal pulmonary
vasculature. No effusion, infiltrate, or pneumothorax. No acute
osseous abnormality.
IMPRESSION: No active cardiopulmonary disease.

## 2023-02-22 ENCOUNTER — Other Ambulatory Visit: Payer: Self-pay | Admitting: Internal Medicine

## 2023-03-10 ENCOUNTER — Encounter: Payer: Self-pay | Admitting: Internal Medicine

## 2023-03-10 ENCOUNTER — Telehealth (INDEPENDENT_AMBULATORY_CARE_PROVIDER_SITE_OTHER): Payer: Self-pay | Admitting: Internal Medicine

## 2023-03-10 DIAGNOSIS — J209 Acute bronchitis, unspecified: Secondary | ICD-10-CM

## 2023-03-10 MED ORDER — METHYLPREDNISOLONE 4 MG PO TBPK: ORAL_TABLET | ORAL | 0 refills | Status: DC

## 2023-03-10 MED ORDER — AZITHROMYCIN 250 MG PO TABS: ORAL_TABLET | ORAL | 0 refills | Status: AC

## 2023-03-10 MED ORDER — CHERATUSSIN AC 100-10 MG/5ML PO SOLN
5.0000 mL | Freq: Three times a day (TID) | ORAL | 0 refills | Status: DC | PRN
Start: 2023-03-10 — End: 2023-05-10

## 2023-03-10 NOTE — Progress Notes (Signed)
Virtual Visit via Video Note   Because of Mukta Svatos Cossin's co-morbid illnesses, she is at least at moderate risk for complications without adequate follow up.  This format is felt to be most appropriate for this patient at this time.  All issues noted in this document were discussed and addressed.  A limited physical exam was performed with this format.      Evaluation Performed:  Follow-up visit  Date:  03/10/2023   ID:  [name], DOB 1965-11-26, MRN [id number]  Patient Location: Home Provider Location: Office/Clinic  Participants: Patient Location of Patient: Home Location of Provider: Telehealth Consent was obtain for visit to be over via telehealth. I verified that I am speaking with the correct person using two identifiers.  PCP:  Billie Lade, MD   Chief Complaint: Cough and sore throat  History of Present Illness:    [name] is a 57 y.o. female who has a video visit for complaint of cough, chest congestion, sore throat and headache for the last 3 days.  She also has fatigue, but denies any fever or chills.  She has mild dyspnea, but denies any wheezing.  She has past history of smoking.  She has tried NyQuil without much relief.  The patient does not have symptoms concerning for COVID-19 infection (fever, chills, cough, or new shortness of breath).   Past Medical, Surgical, Social History, Allergies, and Medications have been Reviewed.  Past Medical History:  Diagnosis Date   ADD (attention deficit disorder with hyperactivity)    Anxiety    Bipolar 1 disorder (HCC)    Carotid artery disease (HCC)    Right 50-69% stenosis by doppler 2010   Chest pain 04/23/2021   Chronic diastolic (congestive) heart failure (HCC)    COPD (chronic obstructive pulmonary disease) (HCC)    Phreesia 12/15/2020   Coronary artery disease    a. s/p CABG in 2009 with LIMA-LAD, SVG-RI, and SVG-RCA b. low-risk NST in 10/2015 c. low-risk NST in 12/2018.   CVA  (cerebral infarction) 12/2008   Reports a neurologist told her she had a blood clot in her brain   Depression    Depression    Phreesia 12/15/2020   Emphysema of lung (HCC) 06/19/2018   Fibromyalgia    GERD (gastroesophageal reflux disease)    Hyperlipidemia    Hypertension    Myocardial infarction (HCC) 09/18/2011   Phreesia 12/15/2020   OSA on CPAP 12/31/2018   Preventative health care 07/16/2022   Sleep apnea    Phreesia 12/15/2020   Stroke (HCC)    Phreesia 12/15/2020   Tobacco abuse    Upper respiratory tract infection 11/09/2021   Past Surgical History:  Procedure Laterality Date   CARDIAC CATHETERIZATION     CORONARY ARTERY BYPASS GRAFT  2009   3V CABG LIMA --> LAD, SVG --> Ramus Intermediate,  SVG --> RCA   LEFT HEART CATH AND CORS/GRAFTS ANGIOGRAPHY N/A 04/24/2021   Procedure: LEFT HEART CATH AND CORS/GRAFTS ANGIOGRAPHY;  Surgeon: Kathleene Hazel, MD;  Location: [facility];  Service: Cardiovascular;  Laterality: N/A;   TUBAL LIGATION  1990     Current Meds  Medication Sig   azithromycin (ZITHROMAX) 250 MG tablet Take 2 tablets on day 1, then 1 tablet daily on days 2 through 5   guaiFENesin-codeine (CHERATUSSIN AC) 100-10 MG/5ML syrup Take 5 mLs by mouth 3 (three) times daily as needed for cough.   methylPREDNISolone (MEDROL DOSEPAK) 4 MG TBPK  tablet Take as package instructions.     Allergies:   Other and Imdur [isosorbide nitrate]   ROS:   Please see the history of present illness.     All other systems reviewed and are negative.   Labs/Other Tests and Data Reviewed:    Recent Labs: 03/10/2022: BNP 16.5; TSH 2.100 07/16/2022: ALT 14; BUN 10; Creatinine, Ser 0.73; Hemoglobin 12.5; Platelets 378; Potassium 4.1; Sodium 137   Recent Lipid Panel Lab Results  Component Value Date/Time   CHOL 235 (H) 11/20/2021 08:10 AM   TRIG 221 (H) 11/20/2021 08:10 AM   HDL 50 11/20/2021 08:10 AM   CHOLHDL 4.7 (H) 11/20/2021 08:10 AM   CHOLHDL 4.1 Ratio  09/29/2009 21:30 PM   LDLCALC 145 (H) 11/20/2021 08:10 AM    Wt Readings from Last 3 Encounters:  12/10/22 143 lb (64.9 kg)  11/15/22 147 lb 6.4 oz (66.9 kg)  07/16/22 146 lb (66.2 kg)     Objective:    Vital Signs:  LMP 10/22/2015    VITAL SIGNS:  reviewed GEN:  no acute distress EYES:  sclerae anicteric, EOMI - Extraocular Movements Intact RESPIRATORY:  normal respiratory effort, symmetric expansion NEURO:  alert and oriented x 3, no obvious focal deficit PSYCH:  normal affect  ASSESSMENT & PLAN:    Acute bronchitis Has persistent symptoms despite trying symptomatic treatment-started empiric azithromycin Started Medrol dosepak Cheratussin as needed for cough Albuterol as needed for dyspnea and wheezing Nasal saline spray as needed for nasal congestion  I discussed the assessment and treatment plan with the patient. The patient was provided an opportunity to ask questions, and all were answered. The patient agreed with the plan and demonstrated an understanding of the instructions.   The patient was advised to call back or seek an in-person evaluation if the symptoms worsen or if the condition fails to improve as anticipated.  The above assessment and management plan was discussed with the patient. The patient verbalized understanding of and has agreed to the management plan.   Medication Adjustments/Labs and Tests Ordered: Current medicines are reviewed at length with the patient today.  Concerns regarding medicines are outlined above.   Tests Ordered: No orders of the defined types were placed in this encounter.   Medication Changes: Meds ordered this encounter  Medications   azithromycin (ZITHROMAX) 250 MG tablet    Sig: Take 2 tablets on day 1, then 1 tablet daily on days 2 through 5    Dispense:  6 tablet    Refill:  0   methylPREDNISolone (MEDROL DOSEPAK) 4 MG TBPK tablet    Sig: Take as package instructions.    Dispense:  1 each    Refill:  0    guaiFENesin-codeine (CHERATUSSIN AC) 100-10 MG/5ML syrup    Sig: Take 5 mLs by mouth 3 (three) times daily as needed for cough.    Dispense:  120 mL    Refill:  0     Note: This dictation was prepared with Dragon dictation along with smaller phrase technology. Similar sounding words can be transcribed inadequately or may not be corrected upon review. Any transcriptional errors that result from this process are unintentional.      Disposition:  Follow up  Signed, Anabel Halon, MD  03/10/2023 8:45 AM     [facility]

## 2023-03-14 ENCOUNTER — Other Ambulatory Visit: Payer: Self-pay | Admitting: Internal Medicine

## 2023-03-14 DIAGNOSIS — G2581 Restless legs syndrome: Secondary | ICD-10-CM

## 2023-03-18 ENCOUNTER — Ambulatory Visit: Payer: Self-pay | Admitting: Internal Medicine

## 2023-04-08 ENCOUNTER — Other Ambulatory Visit: Payer: Self-pay | Admitting: Internal Medicine

## 2023-04-28 ENCOUNTER — Other Ambulatory Visit: Payer: Self-pay | Admitting: Adult Health

## 2023-04-28 DIAGNOSIS — F411 Generalized anxiety disorder: Secondary | ICD-10-CM

## 2023-04-28 DIAGNOSIS — G4700 Insomnia, unspecified: Secondary | ICD-10-CM

## 2023-04-29 NOTE — Telephone Encounter (Signed)
Due 7/12

## 2023-05-10 ENCOUNTER — Ambulatory Visit: Payer: BC Managed Care – PPO | Admitting: Internal Medicine

## 2023-05-10 ENCOUNTER — Encounter: Payer: Self-pay | Admitting: Internal Medicine

## 2023-05-10 VITALS — BP 131/69 | HR 57 | Resp 16 | Ht 62.0 in | Wt 129.0 lb

## 2023-05-10 DIAGNOSIS — J439 Emphysema, unspecified: Secondary | ICD-10-CM | POA: Diagnosis not present

## 2023-05-10 DIAGNOSIS — N951 Menopausal and female climacteric states: Secondary | ICD-10-CM | POA: Diagnosis not present

## 2023-05-10 DIAGNOSIS — Z1211 Encounter for screening for malignant neoplasm of colon: Secondary | ICD-10-CM | POA: Diagnosis not present

## 2023-05-10 DIAGNOSIS — L740 Miliaria rubra: Secondary | ICD-10-CM

## 2023-05-10 MED ORDER — PROAIR RESPICLICK 108 (90 BASE) MCG/ACT IN AEPB
2.0000 | INHALATION_SPRAY | Freq: Four times a day (QID) | RESPIRATORY_TRACT | 5 refills | Status: DC | PRN
Start: 2023-05-10 — End: 2023-09-25

## 2023-05-10 MED ORDER — FEZOLINETANT 45 MG PO TABS
45.0000 mg | ORAL_TABLET | Freq: Every day | ORAL | 0 refills | Status: DC
Start: 2023-05-10 — End: 2023-09-25

## 2023-05-10 NOTE — Patient Instructions (Signed)
Thank you, [name] for allowing [place] to provide your care today.   Refilled - Albuterol Sulfate (PROAIR RESPICLICK) 108 (90 Base) MCG/ACT AEPB; Inhale 2 puffs into the lungs every 6 (six) hours as needed.  Dispense: 1 each; Refill: 5   Vasomotor symptoms due to menopause - Fezolinetant 45 MG TABS; Take 1 tablet (45 mg total) by mouth daily at 6 (six) AM.  Dispense: 90 tablet; Refill: 0  Colon cancer screening - Cologuard  For you heat rash. It has resolved.  Try to wear breathable clothing and stay hydrated. If this becomes a persistent problem let us know.   Reminders: Follow up with Dr.Dixon in August    Thurmon Fair, M.D.

## 2023-05-10 NOTE — Progress Notes (Unsigned)
   HPI:[name].[name] is a 57 y.o. female who presents for evaluation of Rash (Had a raised very itchy rash all over torso and back that started Tues after working that night. Seen come on her legs lastnight but today its much better but she wanted to see what caused it and get something incase it comes back ) . For the details of today's visit, please refer to the assessment and plan.  Physical Exam: Vitals:   05/10/23 0837  BP: 131/69  Pulse: (!) 57  Resp: 16  SpO2: 96%  Weight: 129 lb (58.5 kg)  Height: 5\' 2"  (1.575 m)     Physical Exam Constitutional:      General: She is not in acute distress.    Appearance: She is not ill-appearing.  Cardiovascular:     Rate and Rhythm: Normal rate and regular rhythm.     Heart sounds: No murmur heard. Pulmonary:     Effort: Pulmonary effort is normal.     Breath sounds: No wheezing or rales.  Skin:    General: Skin is warm and dry.     Comments: Rash mostly resolved. Erythematous papule on left side of abdomen      Assessment & Plan:   [name] was seen today for rash.  Vasomotor symptoms due to menopause Assessment & Plan: Chronic problem, uncontrolled Patient is 5+ years post menopause. Having hot flashes daily. No vaginal dryness  Currently on SSRI and NDRI.  Start Veozah 45 mg daily  Orders: -     Fezolinetant; Take 1 tablet (45 mg total) by mouth daily at 6 (six) AM.  Dispense: 90 tablet; Refill: 0  Pulmonary emphysema, unspecified emphysema type (HCC) Overview: Quit smoking 11/2018  And worse since then - PFT's  06/10/18   FEV1 1.75 (68 % ) ratio 0.63  p 8 % improvement from saba p ? prior to study with DLCO  12.32 (21.56%) corrects to 78%  and FV curve mild concavity and ERV 50% at wt 138   - 03/10/2022  After extensive coaching inhaler device,  effectiveness =    80% from a baseline of 50% (short Ti, no breath hold) > continue symbicort 160 1-2 q 12 h pending f/u pfts  -  03/10/2022  allergy screen  IgE = 20/ Eos 0.1    and alpha one AT phenotype  MZ   Level 88  Assessment & Plan: Chronic problem , stable Refilled albuterol   Orders: -     ProAir RespiClick; Inhale 2 puffs into the lungs every 6 (six) hours as needed.  Dispense: 1 each; Refill: 5  Colon cancer screening  Orders: -     Cologuard  Heat rash Assessment & Plan: Minor problem Patient works in [employer] where she wears protective clothing and temperatures in excess of 100 degrees She had rash described as starting on torso and spread to extremities, pruritic, resolved after cooling down and having cold shower.  Counseled on wearing breathable clothes and taking precautions at work to not overheat       Milus Banister, MD

## 2023-05-11 DIAGNOSIS — N951 Menopausal and female climacteric states: Secondary | ICD-10-CM | POA: Insufficient documentation

## 2023-05-11 DIAGNOSIS — L740 Miliaria rubra: Secondary | ICD-10-CM | POA: Insufficient documentation

## 2023-05-11 NOTE — Assessment & Plan Note (Signed)
Chronic problem , stable Refilled albuterol

## 2023-05-11 NOTE — Assessment & Plan Note (Signed)
Chronic problem, uncontrolled Patient is 5+ years post menopause. Having hot flashes daily. No vaginal dryness  Currently on SSRI and NDRI.  Start Veozah 45 mg daily

## 2023-05-11 NOTE — Assessment & Plan Note (Addendum)
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  Counseled on wearing breathable clothes and taking precautions at work to not [employer]

## 2023-06-02 ENCOUNTER — Ambulatory Visit: Payer: BC Managed Care – PPO | Admitting: Internal Medicine

## 2023-06-15 ENCOUNTER — Other Ambulatory Visit: Payer: Self-pay | Admitting: Cardiology

## 2023-06-15 ENCOUNTER — Ambulatory Visit: Payer: BC Managed Care – PPO

## 2023-06-15 ENCOUNTER — Ambulatory Visit: Payer: BC Managed Care – PPO | Attending: Cardiology | Admitting: Cardiology

## 2023-06-15 ENCOUNTER — Encounter: Payer: Self-pay | Admitting: Cardiology

## 2023-06-15 VITALS — BP 110/70 | HR 53 | Ht 62.0 in | Wt 129.6 lb

## 2023-06-15 DIAGNOSIS — R002 Palpitations: Secondary | ICD-10-CM

## 2023-06-15 DIAGNOSIS — I10 Essential (primary) hypertension: Secondary | ICD-10-CM

## 2023-06-15 DIAGNOSIS — E782 Mixed hyperlipidemia: Secondary | ICD-10-CM | POA: Diagnosis not present

## 2023-06-15 DIAGNOSIS — I5032 Chronic diastolic (congestive) heart failure: Secondary | ICD-10-CM | POA: Diagnosis not present

## 2023-06-15 DIAGNOSIS — I2510 Atherosclerotic heart disease of native coronary artery without angina pectoris: Secondary | ICD-10-CM

## 2023-06-15 DIAGNOSIS — G4733 Obstructive sleep apnea (adult) (pediatric): Secondary | ICD-10-CM

## 2023-06-15 DIAGNOSIS — Z79899 Other long term (current) drug therapy: Secondary | ICD-10-CM

## 2023-06-15 MED ORDER — NITROGLYCERIN 0.4 MG SL SUBL: 0.4000 mg | SUBLINGUAL_TABLET | SUBLINGUAL | 3 refills | Status: DC | PRN

## 2023-06-15 MED ORDER — RANOLAZINE ER 1000 MG PO TB12: 1000.0000 mg | ORAL_TABLET | Freq: Two times a day (BID) | ORAL | 1 refills | Status: DC

## 2023-06-15 MED ORDER — LOSARTAN POTASSIUM 25 MG PO TABS
25.0000 mg | ORAL_TABLET | Freq: Every day | ORAL | 1 refills | Status: DC
Start: 2023-06-15 — End: 2023-10-03

## 2023-06-15 MED ORDER — REPATHA SURECLICK 140 MG/ML ~~LOC~~ SOAJ: 140.0000 mg | SUBCUTANEOUS | 1 refills | Status: AC

## 2023-06-15 NOTE — Patient Instructions (Addendum)
Medication Instructions:   Repatha refilled today - sent to [facility] Ranexa, Losartan, Nitroglycerin refilled today - sent to [facility] Continue all other medications.     Labwork:  FLP - order given today Reminder:  Nothing to eat or drink after 12 midnight prior to labs.    Testing/Procedures:  Your physician has recommended that you wear a 7 day event monitor. Event monitors are medical devices that record the heart's electrical activity. Doctors most often [place] these monitors to diagnose arrhythmias. Arrhythmias are problems with the speed or rhythm of the heartbeat. The monitor is a small, portable device. You can wear one while you do your normal daily activities. This is usually used to diagnose what is causing palpitations/syncope (passing out).  Follow-Up:  Office will contact with results via phone, letter or mychart.    6 months   Any Other Special Instructions Will Be Listed Below (If Applicable).  You have been referred to Pulmonology   If you need a refill on your cardiac medications before your next appointment, please call your pharmacy.

## 2023-06-15 NOTE — Progress Notes (Signed)
Clinical Summary [name] is a 57 y.o.female seen today for follow up of the following medical problems.    1. CAD - prior CABG in 2009 with LIMA-LAD, SVG-RI, SVG-RCA - 12/2018 echo LVEF 60-65%, no significant abnormalities - 12/2018 nuclear stress: no ischemia -not on beta blockers due to bradycardia. Intolerant to imdur      04/2021 echo LVEF 65-70%, no WMAs  04/2021 cath: patent LAD, LCX. RCA is CTO. Atretic LIMA-LAD, patent SVG-RCA graft. Chronic occluded SVG-OM1   -no significant chest pains. No SOB/DOE - compliant with meds         2. Palpitations - some palpitations at times - episodes 4-5 times a week, lasts a few minutes - no specific trigger, but often with resting.  - 1 cup of coffee daily, limited sodas      3. Chronic diastolic HF - no SOB/DOE, no LE edema - takes lasix 40mg  daily.    4. COPD - followed by Dr Sherene Sires   5. HTN - she is compliantw ith meds   6. Hyperlipidemia - she is on statin - Jan 2023 TC 235 TG 221 HDL 50 LDL 145 - stopped crestor, was causing joint pains.  - reports has tried multiple statins, all caused muscle aches   - seen in lipid clinic - started on repatha Jan 2023 TC 235 TG221 LDL 145   - compliant with repatha - has not had repeat lipid panel   7. OSA on cpap - not using cpap  - last visit referred to pulmonary, has not seen yet.      SH: works at [employer], works 3rd shift Past Medical History:  Diagnosis Date   ADD (attention deficit disorder with hyperactivity)    Anxiety    Bipolar 1 disorder (HCC)    Carotid artery disease (HCC)    Right 50-69% stenosis by doppler 2010   Chest pain 04/23/2021   Chronic diastolic (congestive) heart failure (HCC)    COPD (chronic obstructive pulmonary disease) (HCC)    Phreesia 12/15/2020   Coronary artery disease    a. s/p CABG in 2009 with LIMA-LAD, SVG-RI, and SVG-RCA b. low-risk NST in 10/2015 c. low-risk NST in 12/2018.   CVA (cerebral infarction) 12/2008    Reports a neurologist told her she had a blood clot in her brain   Depression    Depression    Phreesia 12/15/2020   Emphysema of lung (HCC) 06/19/2018   Fibromyalgia    GERD (gastroesophageal reflux disease)    Hyperlipidemia    Hypertension    Myocardial infarction (HCC) 09/18/2011   Phreesia 12/15/2020   OSA on CPAP 12/31/2018   Preventative health care 07/16/2022   Sleep apnea    Phreesia 12/15/2020   Stroke (HCC)    Phreesia 12/15/2020   Tobacco abuse    Upper respiratory tract infection 11/09/2021     Allergies  Allergen Reactions   Other    Imdur [Isosorbide Nitrate] Other (See Comments)    Headaches; Worsening Chest Pain     Current Outpatient Medications  Medication Sig Dispense Refill   Albuterol Sulfate (PROAIR RESPICLICK) 108 (90 Base) MCG/ACT AEPB Inhale 2 puffs into the lungs every 6 (six) hours as needed. 1 each 5   ALPRAZolam (XANAX) 0.5 MG tablet TAKE 1 TABLET BY MOUTH THREE TIMES DAILY AS NEEDED 90 tablet 5   aspirin EC 81 MG tablet Take 81 mg by mouth daily.     budesonide-formoterol (SYMBICORT) 160-4.5 MCG/ACT inhaler 1-2  puffs every 12 hours 1 each 11   buPROPion (WELLBUTRIN XL) 150 MG 24 hr tablet Take 1 tablet (150 mg total) by mouth daily. 30 tablet 5   Cholecalciferol (VITAMIN D3 PO) Take 1 tablet by mouth daily.     cyclobenzaprine (FLEXERIL) 10 MG tablet Take 10 mg by mouth 2 (two) times daily as needed for muscle spasms.      diphenoxylate-atropine (LOMOTIL) 2.5-0.025 MG tablet Take 2.5 mg by mouth daily as needed.      esomeprazole (NEXIUM) 40 MG capsule TAKE ONE CAPSULE BY MOUTH DAILY 30 capsule 0   Evolocumab (REPATHA SURECLICK) 140 MG/ML SOAJ Inject 1 Dose into the skin every 14 (fourteen) days. 6 mL 1   Fezolinetant 45 MG TABS Take 1 tablet (45 mg total) by mouth daily at 6 (six) AM. 90 tablet 0   FLUoxetine (PROZAC) 40 MG capsule Take 1 capsule (40 mg total) by mouth 2 (two) times daily. 60 capsule 5   fluticasone (FLONASE) 50 MCG/ACT  nasal spray Place 2 sprays into both nostrils daily. 16 g 6   furosemide (LASIX) 40 MG tablet Take 1 tablet (40 mg total) by mouth daily. 90 tablet 3   ibuprofen (ADVIL) 800 MG tablet Take 800 mg by mouth every 8 (eight) hours as needed.     losartan (COZAAR) 25 MG tablet Take 1 tablet (25 mg total) by mouth daily. 90 tablet 1   Multiple Vitamins-Minerals (CENTRUM SILVER PO) Take 1 tablet by mouth daily.     nitroGLYCERIN (NITROSTAT) 0.4 MG SL tablet Place 1 tablet (0.4 mg total) under the tongue every 5 (five) minutes x 3 doses as needed for chest pain (if no relief after 3rd dose, proceed to the ED for an evaluation). 25 tablet 3   ondansetron (ZOFRAN) 4 MG tablet Take 4 mg by mouth every 8 (eight) hours as needed.     potassium chloride (KLOR-CON) 20 MEQ packet Take 20 mEq by mouth 2 (two) times daily. 60 tablet 6   ranolazine (RANEXA) 1000 MG SR tablet Take 1 tablet (1,000 mg total) by mouth 2 (two) times daily. 180 tablet 3   rOPINIRole (REQUIP) 1 MG tablet TAKE 1 TABLET BY MOUTH AT BEDTIME AS NEEDED 30 tablet 2   vitamin B-12 (CYANOCOBALAMIN) 100 MCG tablet Take 100 mcg by mouth daily.     zolpidem (AMBIEN) 10 MG tablet TAKE 1 TABLET BY MOUTH AT BEDTIME 30 tablet 5   No current facility-administered medications for this visit.     Past Surgical History:  Procedure Laterality Date   CARDIAC CATHETERIZATION     CORONARY ARTERY BYPASS GRAFT  2009   3V CABG LIMA --> LAD, SVG --> Ramus Intermediate,  SVG --> RCA   LEFT HEART CATH AND CORS/GRAFTS ANGIOGRAPHY N/A 04/24/2021   Procedure: LEFT HEART CATH AND CORS/GRAFTS ANGIOGRAPHY;  Surgeon: Kathleene Hazel, MD;  Location: [facility];  Service: Cardiovascular;  Laterality: N/A;   TUBAL LIGATION  1990     Allergies  Allergen Reactions   Other    Imdur [Isosorbide Nitrate] Other (See Comments)    Headaches; Worsening Chest Pain      Family History  Problem Relation Age of Onset   CAD Mother    Hypertension Mother     Stroke Mother    Heart disease Father    Diabetes type II Father    Intellectual disability Maternal Aunt    Intellectual disability Maternal Uncle    Alcohol abuse Paternal Grandfather  Colon cancer Neg Hx    Breast cancer Neg Hx    Lung cancer Neg Hx      Social History [name] reports that she quit smoking about 4 years ago. Her smoking use included cigarettes. She started smoking about 41 years ago. She has a 38 pack-year smoking history. She has never used smokeless tobacco. [name] reports that she does not currently use alcohol.   Review of Systems CONSTITUTIONAL: No weight loss, fever, chills, weakness or fatigue.  HEENT: Eyes: No visual loss, blurred vision, double vision or yellow sclerae.No hearing loss, sneezing, congestion, runny nose or sore throat.  SKIN: No rash or itching.  CARDIOVASCULAR: per hpi RESPIRATORY: No shortness of breath, cough or sputum.  GASTROINTESTINAL: No anorexia, nausea, vomiting or diarrhea. No abdominal pain or blood.  GENITOURINARY: No burning on urination, no polyuria NEUROLOGICAL: No headache, dizziness, syncope, paralysis, ataxia, numbness or tingling in the extremities. No change in bowel or bladder control.  MUSCULOSKELETAL: No muscle, back pain, joint pain or stiffness.  LYMPHATICS: No enlarged nodes. No history of splenectomy.  PSYCHIATRIC: No history of depression or anxiety.  ENDOCRINOLOGIC: No reports of sweating, cold or heat intolerance. No polyuria or polydipsia.  Marland Kitchen   Physical Examination Today's Vitals   06/15/23 0811  BP: 110/70  Pulse: (!) 53  SpO2: 98%  Weight: 129 lb 9.6 oz (58.8 kg)  Height: 5\' 2"  (1.575 m)   Body mass index is 23.7 kg/m.  Gen: resting comfortably, no acute distress HEENT: no scleral icterus, pupils equal round and reactive, no palptable cervical adenopathy,  CV: regular, brady 55, no m/rg, no jvd Resp: Clear to auscultation bilaterally GI: abdomen is soft, non-tender, non-distended,  normal bowel sounds, no hepatosplenomegaly MSK: extremities are warm, no edema.  Skin: warm, no rash Neuro:  no focal deficits Psych: appropriate affect   Diagnostic Studies  Echocardiogram: 12/2018  IMPRESSIONS      1. The left ventricle has normal systolic function with an ejection fraction of 60-65%. The cavity size was normal. Left ventricular diastolic parameters were normal No evidence of left ventricular regional wall motion abnormalities.  2. The mitral valve is normal in structure.  3. The tricuspid valve is normal in structure.  4. The aortic valve is tricuspid.  5. The aortic root is normal in size and structure.  6. No evidence of left ventricular regional wall motion abnormalities.   NST: 01/09/2019 Blood pressure demonstrated a hypertensive response to exercise. Horizontal ST segment depression ST segment depression of 0.5 mm was noted during recovery in the II, III, aVF, V5 and V6 leads. The patient was asymptomatic. The study is normal. No perfusion defects consistent with myocardial ischemia or scar. This is a low risk study. Nuclear stress EF: 71%.     04/2021 echo 1. Left ventricular ejection fraction, by estimation, is 65 to 70%. The  left ventricle has normal function. The left ventricle has no regional  wall motion abnormalities. Left ventricular diastolic parameters were  normal.   2. Right ventricular systolic function is normal. The right ventricular  size is mildly enlarged. Tricuspid regurgitation signal is inadequate for  assessing PA pressure.   3. The mitral valve is grossly normal. Trivial mitral valve  regurgitation.   4. The aortic valve is tricuspid. Aortic valve regurgitation is not  visualized. Aortic valve mean gradient measures 4.0 mmHg.   5. The inferior vena cava is normal in size with greater than 50%  respiratory variability, suggesting right atrial pressure of  3 mmHg.    Assessment and Plan   1. CAD - no symptoms, continue  current meds       2. HTN -her bp is at goal, continue current meds   3.Hyperlipidemia - intolerant to statins, now on repatha.FOllowed in lipid clinic -we will repeat lipid panel   4. Chronic HFpEF - euvolemic, continue current meds   5. OSA - cpap is uncomfortable, not wearing at home - refer to pulmonary for evaluation.      Antoine Poche, M.D.

## 2023-06-17 ENCOUNTER — Encounter: Payer: Self-pay | Admitting: Internal Medicine

## 2023-06-17 ENCOUNTER — Ambulatory Visit: Payer: BC Managed Care – PPO | Admitting: Internal Medicine

## 2023-06-17 VITALS — BP 162/76 | HR 51 | Ht 62.0 in | Wt 130.0 lb

## 2023-06-17 DIAGNOSIS — M549 Dorsalgia, unspecified: Secondary | ICD-10-CM | POA: Diagnosis not present

## 2023-06-17 DIAGNOSIS — L299 Pruritus, unspecified: Secondary | ICD-10-CM

## 2023-06-17 NOTE — Progress Notes (Signed)
Acute Office Visit  Subjective:     Patient ID: [name], female    DOB: 03/15/1966, 57 y.o.   MRN: [id number]  Chief Complaint  Patient presents with   Back Pain    Upper back muscle ache 10/10 pain scale. And itching under skin.    [name] presents today for an acute visit endorsing midline upper back pain.  Pain has been present for the last year.  There was no inciting event or trauma at the onset of pain.  Pain has worsened over the last month, leading her to present today.  She largely denies associated symptoms including numbness/weakness in her upper extremities.  She has not been able to identify exacerbating or alleviating factors.  She has been using NSAIDs and Flexeril for as needed pain relief. [name] additionally reports itching along the middle of her back.  She has previously asked her husband to look at her back but no rash has been appreciated.    Review of Systems  Musculoskeletal:  Positive for back pain.  Skin:  Positive for itching.      Objective:    BP (!) 162/76 (BP Location: Left Arm, Patient Position: Sitting, Cuff Size: Normal)   Pulse (!) 51   Ht 5\' 2"  (1.575 m)   Wt 130 lb (59 kg)   LMP 10/22/2015   SpO2 98%   BMI 23.78 kg/m   Physical Exam Vitals reviewed.  Constitutional:      General: She is not in acute distress.    Appearance: Normal appearance. She is not toxic-appearing.  HENT:     Head: Normocephalic and atraumatic.     Right Ear: External ear normal.     Left Ear: External ear normal.     Nose: Nose normal. No congestion or rhinorrhea.     Mouth/Throat:     Mouth: Mucous membranes are moist.     Pharynx: Oropharynx is clear. No oropharyngeal exudate or posterior oropharyngeal erythema.  Eyes:     General: No scleral icterus.    Conjunctiva/sclera: Conjunctivae normal.     Pupils: Pupils are equal, round, and reactive to light.  Cardiovascular:     Rate and Rhythm: Normal rate and regular rhythm.     Pulses:  Normal pulses.     Heart sounds: Normal heart sounds.  Pulmonary:     Effort: Pulmonary effort is normal.     Breath sounds: Normal breath sounds. No wheezing, rhonchi or rales.  Abdominal:     General: Abdomen is flat. Bowel sounds are normal. There is no distension.     Palpations: Abdomen is soft.     Tenderness: There is no abdominal tenderness.  Musculoskeletal:        General: Tenderness (TTP to midline palpation of the cervicothoracic spine) present. No swelling. Normal range of motion.     Cervical back: Normal range of motion.     Right lower leg: No edema.     Left lower leg: No edema.  Lymphadenopathy:     Cervical: No cervical adenopathy.  Skin:    General: Skin is warm and dry.     Capillary Refill: Capillary refill takes less than 2 seconds.     Coloration: Skin is not jaundiced.     Comments: Evidence of excoriation is present along the thoracic region of the back, but no rash appreciated.  Neurological:     General: No focal deficit present.     Mental Status: She is alert and  oriented to person, place, and time.     Motor: No weakness.  Psychiatric:        Mood and Affect: Mood normal.        Behavior: Behavior normal.       Assessment & Plan:   Problem List Items Addressed This Visit       Upper back pain - Primary    Presenting today for an acute visit endorsing a 1 year history of cervicothoracic back pain.  There was no inciting event or trauma at the onset of pain.  There is tenderness to midline palpation.  No exacerbating or alleviating factors are identified.  She identifies upper extremity numbness/weakness. -Given persistence of pain, x-rays of the cervical and thoracic spine have been ordered today -PT referral placed -Recommend continued use of NSAIDs and Flexeril for as needed pain relief -Further workup pending imaging results and completion of physical therapy      Itching    She endorses persistent itching along the thoracic region of her  back.  There is evidence of excoriation present on inspection but no rash identified. -Recommend use of OTC topical itch relief cream      Return in about 6 weeks (around 07/29/2023).  Billie Lade, MD

## 2023-06-17 NOTE — Patient Instructions (Signed)
It was a pleasure to see you today.  Thank you for giving [place] the opportunity to be involved in your care.  Below is a brief recap of your visit and next steps.  We will plan to see you again in 6-8 weeks.   Summary Xrays ordered PT referral placed OK to continue ibuprofen and flexeril as needed Follow up in 6-8 weeks for routine care

## 2023-06-18 DIAGNOSIS — R002 Palpitations: Secondary | ICD-10-CM | POA: Diagnosis not present

## 2023-06-20 ENCOUNTER — Encounter (HOSPITAL_COMMUNITY): Payer: Self-pay

## 2023-06-20 ENCOUNTER — Ambulatory Visit (HOSPITAL_COMMUNITY): Admission: RE | Admit: 2023-06-20 | Payer: BC Managed Care – PPO | Source: Ambulatory Visit

## 2023-06-21 ENCOUNTER — Ambulatory Visit: Payer: BC Managed Care – PPO

## 2023-06-27 ENCOUNTER — Encounter: Payer: Self-pay | Admitting: Internal Medicine

## 2023-06-27 DIAGNOSIS — M549 Dorsalgia, unspecified: Secondary | ICD-10-CM | POA: Insufficient documentation

## 2023-06-27 DIAGNOSIS — L299 Pruritus, unspecified: Secondary | ICD-10-CM | POA: Insufficient documentation

## 2023-06-27 NOTE — Assessment & Plan Note (Signed)
She endorses persistent itching along the thoracic region of her back.  There is evidence of excoriation present on inspection but no rash identified. -Recommend use of OTC topical itch relief cream

## 2023-06-27 NOTE — Assessment & Plan Note (Signed)
Presenting today for an acute visit endorsing a 1 year history of cervicothoracic back pain.  There was no inciting event or trauma at the onset of pain.  There is tenderness to midline palpation.  No exacerbating or alleviating factors are identified.  She identifies upper extremity numbness/weakness. -Given persistence of pain, x-rays of the cervical and thoracic spine have been ordered today -PT referral placed -Recommend continued use of NSAIDs and Flexeril for as needed pain relief -Further workup pending imaging results and completion of physical therapy

## 2023-06-29 ENCOUNTER — Ambulatory Visit (INDEPENDENT_AMBULATORY_CARE_PROVIDER_SITE_OTHER): Payer: BC Managed Care – PPO

## 2023-06-29 DIAGNOSIS — Z23 Encounter for immunization: Secondary | ICD-10-CM | POA: Diagnosis not present

## 2023-06-30 DIAGNOSIS — R002 Palpitations: Secondary | ICD-10-CM | POA: Diagnosis not present

## 2023-07-07 ENCOUNTER — Encounter: Payer: Self-pay | Admitting: Pulmonary Disease

## 2023-07-24 ENCOUNTER — Other Ambulatory Visit: Payer: Self-pay | Admitting: Internal Medicine

## 2023-07-29 ENCOUNTER — Other Ambulatory Visit: Payer: Self-pay | Admitting: Internal Medicine

## 2023-07-29 DIAGNOSIS — Z1212 Encounter for screening for malignant neoplasm of rectum: Secondary | ICD-10-CM

## 2023-07-29 DIAGNOSIS — Z1211 Encounter for screening for malignant neoplasm of colon: Secondary | ICD-10-CM

## 2023-08-09 ENCOUNTER — Ambulatory Visit: Payer: Self-pay | Admitting: Internal Medicine

## 2023-09-19 ENCOUNTER — Other Ambulatory Visit: Payer: Self-pay | Admitting: Adult Health

## 2023-09-19 DIAGNOSIS — F331 Major depressive disorder, recurrent, moderate: Secondary | ICD-10-CM

## 2023-09-19 NOTE — Telephone Encounter (Signed)
Please schedule pt an appt. She is due back in March. LV 3/

## 2023-09-24 ENCOUNTER — Encounter (HOSPITAL_COMMUNITY): Payer: Self-pay

## 2023-09-24 ENCOUNTER — Observation Stay (HOSPITAL_COMMUNITY)
Admission: EM | Admit: 2023-09-24 | Discharge: 2023-09-27 | Disposition: A | Discharge status: Home / Self Care | Payer: BC Managed Care – PPO | Attending: Internal Medicine | Admitting: Internal Medicine

## 2023-09-24 ENCOUNTER — Emergency Department (HOSPITAL_COMMUNITY): Payer: BC Managed Care – PPO

## 2023-09-24 ENCOUNTER — Other Ambulatory Visit: Payer: Self-pay

## 2023-09-24 DIAGNOSIS — Z951 Presence of aortocoronary bypass graft: Secondary | ICD-10-CM | POA: Diagnosis not present

## 2023-09-24 DIAGNOSIS — R0789 Other chest pain: Secondary | ICD-10-CM | POA: Diagnosis not present

## 2023-09-24 DIAGNOSIS — R109 Unspecified abdominal pain: Secondary | ICD-10-CM | POA: Diagnosis not present

## 2023-09-24 DIAGNOSIS — I5032 Chronic diastolic (congestive) heart failure: Secondary | ICD-10-CM | POA: Insufficient documentation

## 2023-09-24 DIAGNOSIS — I2510 Atherosclerotic heart disease of native coronary artery without angina pectoris: Secondary | ICD-10-CM | POA: Diagnosis not present

## 2023-09-24 DIAGNOSIS — J449 Chronic obstructive pulmonary disease, unspecified: Secondary | ICD-10-CM | POA: Diagnosis not present

## 2023-09-24 DIAGNOSIS — K8012 Calculus of gallbladder with acute and chronic cholecystitis without obstruction: Secondary | ICD-10-CM | POA: Diagnosis not present

## 2023-09-24 DIAGNOSIS — Z79899 Other long term (current) drug therapy: Secondary | ICD-10-CM | POA: Diagnosis not present

## 2023-09-24 DIAGNOSIS — I10 Essential (primary) hypertension: Secondary | ICD-10-CM | POA: Diagnosis present

## 2023-09-24 DIAGNOSIS — Z87891 Personal history of nicotine dependence: Secondary | ICD-10-CM | POA: Insufficient documentation

## 2023-09-24 DIAGNOSIS — Z7982 Long term (current) use of aspirin: Secondary | ICD-10-CM | POA: Insufficient documentation

## 2023-09-24 DIAGNOSIS — I110 Hypertensive heart disease with heart failure: Secondary | ICD-10-CM | POA: Diagnosis not present

## 2023-09-24 DIAGNOSIS — G2581 Restless legs syndrome: Secondary | ICD-10-CM | POA: Diagnosis present

## 2023-09-24 DIAGNOSIS — R001 Bradycardia, unspecified: Secondary | ICD-10-CM | POA: Insufficient documentation

## 2023-09-24 DIAGNOSIS — K760 Fatty (change of) liver, not elsewhere classified: Secondary | ICD-10-CM | POA: Diagnosis not present

## 2023-09-24 DIAGNOSIS — K810 Acute cholecystitis: Principal | ICD-10-CM | POA: Diagnosis present

## 2023-09-24 DIAGNOSIS — E782 Mixed hyperlipidemia: Secondary | ICD-10-CM | POA: Diagnosis present

## 2023-09-24 DIAGNOSIS — R079 Chest pain, unspecified: Secondary | ICD-10-CM | POA: Diagnosis not present

## 2023-09-24 DIAGNOSIS — K828 Other specified diseases of gallbladder: Secondary | ICD-10-CM | POA: Diagnosis not present

## 2023-09-24 DIAGNOSIS — I700 Atherosclerosis of aorta: Secondary | ICD-10-CM | POA: Diagnosis not present

## 2023-09-24 LAB — BASIC METABOLIC PANEL
Anion gap: 11 (ref 5–15)
BUN: 13 mg/dL (ref 6–20)
CO2: 21 mmol/L — ABNORMAL LOW (ref 22–32)
Calcium: 9.4 mg/dL (ref 8.9–10.3)
Chloride: 102 mmol/L (ref 98–111)
Creatinine, Ser: 0.71 mg/dL (ref 0.44–1.00)
GFR, Estimated: >60 mL/min (ref >60)
Glucose, Bld: 103 mg/dL — ABNORMAL HIGH (ref 70–99)
Potassium: 3.6 mmol/L (ref 3.5–5.1)
Sodium: 134 mmol/L — ABNORMAL LOW (ref 135–145)

## 2023-09-24 LAB — CBC
HCT: 39.9 % (ref 36.0–46.0)
Hemoglobin: 13.1 g/dL (ref 12.0–15.0)
MCH: 29.4 pg (ref 26.0–34.0)
MCHC: 32.8 g/dL (ref 30.0–36.0)
MCV: 89.5 fL (ref 80.0–100.0)
Platelets: 361 10*3/uL (ref 150–400)
RBC: 4.46 MIL/uL (ref 3.87–5.11)
RDW: 12.8 % (ref 11.5–15.5)
WBC: 5.7 10*3/uL (ref 4.0–10.5)
nRBC: 0 % (ref 0.0–0.2)

## 2023-09-24 LAB — HEPATIC FUNCTION PANEL
ALT: 14 U/L (ref 0–44)
AST: 19 U/L (ref 15–41)
Albumin: 4.1 g/dL (ref 3.5–5.0)
Alkaline Phosphatase: 88 U/L (ref 38–126)
Bilirubin, Direct: <0.1 mg/dL (ref 0.0–0.2)
Total Bilirubin: 0.3 mg/dL (ref <1.2)
Total Protein: 7.7 g/dL (ref 6.5–8.1)

## 2023-09-24 LAB — TROPONIN I (HIGH SENSITIVITY)
Troponin I (High Sensitivity): 4 ng/L (ref <18)
Troponin I (High Sensitivity): 4 ng/L (ref <18)

## 2023-09-24 LAB — MAGNESIUM: Magnesium: 1.8 mg/dL (ref 1.7–2.4)

## 2023-09-24 LAB — LIPASE, BLOOD: Lipase: 51 U/L (ref 11–51)

## 2023-09-24 MED ORDER — IOHEXOL 300 MG/ML  SOLN
100.0000 mL | Freq: Once | INTRAMUSCULAR | Status: AC | PRN
  Administered 2023-09-24: 100 mL via INTRAVENOUS

## 2023-09-24 MED ORDER — MORPHINE SULFATE (PF) 4 MG/ML IV SOLN
4.0000 mg | Freq: Once | INTRAVENOUS | Status: AC
  Administered 2023-09-24: 4 mg via INTRAVENOUS
  Filled 2023-09-24: qty 1

## 2023-09-24 MED ORDER — ASPIRIN 325 MG PO TABS
325.0000 mg | ORAL_TABLET | Freq: Once | ORAL | Status: AC
  Administered 2023-09-24: 325 mg via ORAL
  Filled 2023-09-24: qty 1

## 2023-09-24 MED ORDER — ONDANSETRON HCL 4 MG/2ML IJ SOLN
4.0000 mg | Freq: Once | INTRAMUSCULAR | Status: AC | PRN
  Administered 2023-09-24: 4 mg via INTRAVENOUS
  Filled 2023-09-24: qty 2

## 2023-09-24 NOTE — ED Triage Notes (Signed)
Pt stated that she was at work and started having severe pain under her right breast, upper abdomen and chest. Pt stated that she is also experiencing back pain. Pt had triple bypass surgery in 2009.

## 2023-09-24 NOTE — ED Notes (Signed)
Julie, PA at bedside.

## 2023-09-24 NOTE — ED Notes (Signed)
Pt having one episode of vomiting at this time. Dr. Jearld Fenton informed.

## 2023-09-24 NOTE — ED Notes (Signed)
Patient transported to X-ray 

## 2023-09-24 NOTE — ED Provider Notes (Signed)
[facility] AT [facility] Provider Note   CSN: [id number] Arrival date & time: 09/24/23  2033     History  Chief Complaint  Patient presents with   Chest Pain    [name] is a 57 y.o. female with a history significant for CAD, hyperlipidemia, emphysema COPD, hypertension, fibromyalgia history of CVA, GERD, CHF presenting for evaluation of sudden onset of abdominal and chest pain with radiation into her back along with nausea and vomiting.  She was just starting her shift at work when she had severe sharp pain in her epigastric region substernal rating to her mid back.  She had several episodes of emesis after arriving here.  She denies shortness of breath, fevers, diarrhea.  Had no treatment prior to arrival.  Review of chart with the following cardiac history  CAD CABG in 2009, 04/2021 last echo with ejection fraction 65 to 70%.  04/2021 last cath patent vessels, patent SVG-RCA graft.  The history is provided by the patient.       Home Medications Prior to Admission medications   Medication Sig Start Date End Date Taking? Authorizing Provider  Albuterol Sulfate (PROAIR RESPICLICK) 108 (90 Base) MCG/ACT AEPB Inhale 2 puffs into the lungs every 6 (six) hours as needed. 05/10/23   Gardenia Phlegm, MD  ALPRAZolam Prudy Feeler) 0.5 MG tablet TAKE 1 TABLET BY MOUTH THREE TIMES DAILY AS NEEDED 05/06/23   Mozingo, Thereasa Solo, NP  aspirin EC 81 MG tablet Take 81 mg by mouth daily.    [provider]  budesonide-formoterol [facility]) 160-4.5 MCG/ACT inhaler 1-2 puffs every 12 hours 03/10/22   Nyoka Cowden, MD  buPROPion (WELLBUTRIN XL) 150 MG 24 hr tablet TAKE 1 TABLET BY MOUTH DAILY 09/19/23   Mozingo, Thereasa Solo, NP  Cholecalciferol (VITAMIN D3 PO) Take 1 tablet by mouth daily.    [provider]  cyclobenzaprine (FLEXERIL) 10 MG tablet Take 10 mg by mouth 2 (two) times daily as needed for muscle spasms.     [provider]  diphenoxylate-atropine (LOMOTIL) 2.5-0.025 MG tablet Take 2.5 mg by mouth daily as needed.     [provider]  esomeprazole (NEXIUM) 40 MG capsule TAKE ONE CAPSULE BY MOUTH DAILY 07/25/23   Billie Lade, MD  Evolocumab (REPATHA SURECLICK) 140 MG/ML SOAJ Inject 140 mg into the skin every 14 (fourteen) days. 06/15/23   Antoine Poche, MD  Fezolinetant 45 MG TABS Take 1 tablet (45 mg total) by mouth daily at 6 (six) AM. 05/10/23   Gardenia Phlegm, MD  FLUoxetine (PROZAC) 40 MG capsule Take 1 capsule (40 mg total) by mouth 2 (two) times daily. 12/30/22   Mozingo, Thereasa Solo, NP  fluticasone (FLONASE) 50 MCG/ACT nasal spray Place 2 sprays into both nostrils daily. 11/09/21   Donell Beers, FNP  furosemide (LASIX) 40 MG tablet Take 1 tablet (40 mg total) by mouth daily. 12/10/22   Antoine Poche, MD  ibuprofen (ADVIL) 800 MG tablet Take 800 mg by mouth every 8 (eight) hours as needed. 01/12/20   [provider]  losartan (COZAAR) 25 MG tablet Take 1 tablet (25 mg total) by mouth daily. 06/15/23   Antoine Poche, MD  Multiple Vitamins-Minerals (CENTRUM SILVER PO) Take 1 tablet by mouth daily.    [provider]  nitroGLYCERIN (NITROSTAT) 0.4 MG SL tablet Place 1 tablet (0.4 mg total) under the tongue every 5 (five) minutes x 3 doses as needed for chest  pain (if no relief after 3rd dose, proceed to the ED for an evaluation). 06/15/23   Antoine Poche, MD  ondansetron (ZOFRAN) 4 MG tablet Take 4 mg by mouth every 8 (eight) hours as needed. 11/01/19   [provider]  potassium chloride (KLOR-CON) 20 MEQ packet Take 20 mEq by mouth 2 (two) times daily. 12/06/18   Laqueta Linden, MD  ranolazine (RANEXA) 1000 MG SR tablet Take 1 tablet (1,000 mg total) by mouth 2 (two) times daily. 06/15/23   Antoine Poche, MD  rOPINIRole (REQUIP) 1 MG tablet TAKE 1 TABLET BY MOUTH AT BEDTIME AS NEEDED 03/14/23   Billie Lade, MD  vitamin B-12  (CYANOCOBALAMIN) 100 MCG tablet Take 100 mcg by mouth daily.    [provider]  zolpidem (AMBIEN) 10 MG tablet TAKE 1 TABLET BY MOUTH AT BEDTIME 05/06/23   Mozingo, Thereasa Solo, NP      Allergies    Other and Imdur [isosorbide nitrate]    Review of Systems   Review of Systems  Constitutional:  Negative for chills and fever.  HENT:  Negative for congestion and sore throat.   Eyes: Negative.   Respiratory:  Negative for chest tightness and shortness of breath.   Cardiovascular:  Positive for chest pain.  Gastrointestinal:  Positive for abdominal pain, nausea and vomiting.  Genitourinary: Negative.   Musculoskeletal:  Negative for arthralgias, joint swelling and neck pain.  Skin: Negative.  Negative for rash and wound.  Neurological:  Negative for dizziness, weakness, light-headedness, numbness and headaches.  Psychiatric/Behavioral: Negative.      Physical Exam Updated Vital Signs BP 132/68   Pulse (!) 43   Temp 98 F (36.7 C) (Oral)   Resp 14   Ht 5\' 2"  (1.575 m)   Wt 56.7 kg   LMP 10/22/2015   SpO2 98%   BMI 22.86 kg/m  Physical Exam Vitals and nursing note reviewed.  Constitutional:      Appearance: She is well-developed.  HENT:     Head: Normocephalic and atraumatic.  Eyes:     Conjunctiva/sclera: Conjunctivae normal.  Cardiovascular:     Rate and Rhythm: Normal rate and regular rhythm.     Heart sounds: Normal heart sounds.  Pulmonary:     Effort: Pulmonary effort is normal.     Breath sounds: Normal breath sounds. No wheezing.  Abdominal:     General: Bowel sounds are normal.     Palpations: Abdomen is soft.     Tenderness: There is abdominal tenderness in the right upper quadrant and epigastric area. Positive signs include Murphy's sign.  Musculoskeletal:        General: Normal range of motion.     Cervical back: Normal range of motion.  Skin:    General: Skin is warm and dry.  Neurological:     Mental Status: She is alert.     ED  Results / Procedures / Treatments   Labs (all labs ordered are listed, but only abnormal results are displayed) Labs Reviewed  BASIC METABOLIC PANEL - Abnormal; Notable for the following components:      Result Value   Sodium 134 (*)    CO2 21 (*)    Glucose, Bld 103 (*)    All other components within normal limits  CBC  HEPATIC FUNCTION PANEL  LIPASE, BLOOD  MAGNESIUM  TROPONIN I (HIGH SENSITIVITY)  TROPONIN I (HIGH SENSITIVITY)    EKG EKG Interpretation Date/Time:  Saturday September 24 2023 20:58:07 EST  Ventricular Rate:  57 PR Interval:  135 QRS Duration:  104 QT Interval:  468 QTC Calculation: 456 R Axis:   87  Text Interpretation: Sinus rhythm Multiple ventricular premature complexes Borderline ST depression, diffuse leads Confirmed by Vivi Barrack [phone]) on 09/24/2023 9:32:16 PM  Radiology DG Chest 2 View  Result Date: 09/24/2023 CLINICAL DATA:  Chest pain EXAM: CHEST - 2 VIEW COMPARISON:  CXR 04/23/21. CT chest 06/18/2022 FINDINGS: The heart and mediastinal contours are unchanged. Atherosclerotic plaque. Hyperinflation of the lungs. Biapical pleural/pulmonary scarring. No focal consolidation. No pulmonary edema. No pleural effusion. No pneumothorax. No acute osseous abnormality. Sternotomy wires intact. IMPRESSION: 1. No active cardiopulmonary disease. 2.  Aortic Atherosclerosis (ICD10-I70.0). Electronically Signed   By: Tish Frederickson M.D.   On: 09/24/2023 21:44    Procedures Procedures    Medications Ordered in ED Medications  ondansetron [facility]) injection 4 mg (4 mg Intravenous Given 09/24/23 2055)  aspirin tablet 325 mg (325 mg Oral Given 09/24/23 2118)  morphine (PF) 4 MG/ML injection 4 mg (4 mg Intravenous Given 09/24/23 2118)    ED Course/ Medical Decision Making/ A&P                                 Medical Decision Making Patient with a significant cardiac history presenting with epigastric right upper quadrant chest pain radiating into her mid  back along with nausea and vomiting, her symptoms started around 8 PM this evening.  Differential diagnosis including acute MI, unstable angina, exam however suggest more of an abdominal source, concerning for acute cholecystitis, could also represent pancreatitis, bowel obstruction, pneumonia, labs have been completed including delta troponins which are negative, lipase and hepatic function panel are also negative, she has a normal WBC count at 5.7.  CT abdomen and pelvis ordered to assess for possible acute cholecystitis.  Dr. Bernette Mayers assumes care.  Amount and/or Complexity of Data Reviewed Labs: ordered.    Details: Per above. Radiology: ordered.    Details: No active cardiopulmonary disease. ECG/medicine tests: ordered.    Details: Rate 57, multiple PVCs,  Risk Prescription drug management.           Final Clinical Impression(s) / ED Diagnoses Final diagnoses:  None    Rx / DC Orders ED Discharge Orders     None         Victoriano Lain 09/24/23 2320    Loetta Rough, MD 10/01/23 1319

## 2023-09-24 NOTE — ED Notes (Signed)
Pt has returned from xray

## 2023-09-25 ENCOUNTER — Other Ambulatory Visit: Payer: Self-pay

## 2023-09-25 ENCOUNTER — Inpatient Hospital Stay (HOSPITAL_COMMUNITY): Payer: BC Managed Care – PPO

## 2023-09-25 ENCOUNTER — Encounter (HOSPITAL_COMMUNITY): Payer: Self-pay | Admitting: Internal Medicine

## 2023-09-25 DIAGNOSIS — I2510 Atherosclerotic heart disease of native coronary artery without angina pectoris: Secondary | ICD-10-CM

## 2023-09-25 DIAGNOSIS — E782 Mixed hyperlipidemia: Secondary | ICD-10-CM

## 2023-09-25 DIAGNOSIS — R109 Unspecified abdominal pain: Secondary | ICD-10-CM | POA: Insufficient documentation

## 2023-09-25 DIAGNOSIS — J449 Chronic obstructive pulmonary disease, unspecified: Secondary | ICD-10-CM

## 2023-09-25 DIAGNOSIS — R609 Edema, unspecified: Secondary | ICD-10-CM | POA: Diagnosis not present

## 2023-09-25 DIAGNOSIS — K810 Acute cholecystitis: Principal | ICD-10-CM | POA: Diagnosis present

## 2023-09-25 DIAGNOSIS — R1010 Upper abdominal pain, unspecified: Secondary | ICD-10-CM | POA: Diagnosis not present

## 2023-09-25 DIAGNOSIS — I10 Essential (primary) hypertension: Secondary | ICD-10-CM | POA: Diagnosis not present

## 2023-09-25 DIAGNOSIS — G2581 Restless legs syndrome: Secondary | ICD-10-CM

## 2023-09-25 DIAGNOSIS — K8020 Calculus of gallbladder without cholecystitis without obstruction: Secondary | ICD-10-CM | POA: Diagnosis not present

## 2023-09-25 DIAGNOSIS — R001 Bradycardia, unspecified: Secondary | ICD-10-CM | POA: Insufficient documentation

## 2023-09-25 LAB — COMPREHENSIVE METABOLIC PANEL
ALT: 12 U/L (ref 0–44)
AST: 17 U/L (ref 15–41)
Albumin: 3.6 g/dL (ref 3.5–5.0)
Alkaline Phosphatase: 76 U/L (ref 38–126)
Anion gap: 9 (ref 5–15)
BUN: 11 mg/dL (ref 6–20)
CO2: 27 mmol/L (ref 22–32)
Calcium: 9.3 mg/dL (ref 8.9–10.3)
Chloride: 102 mmol/L (ref 98–111)
Creatinine, Ser: 0.71 mg/dL (ref 0.44–1.00)
GFR, Estimated: >60 mL/min (ref >60)
Glucose, Bld: 101 mg/dL — ABNORMAL HIGH (ref 70–99)
Potassium: 3.9 mmol/L (ref 3.5–5.1)
Sodium: 138 mmol/L (ref 135–145)
Total Bilirubin: 0.4 mg/dL (ref <1.2)
Total Protein: 6.8 g/dL (ref 6.5–8.1)

## 2023-09-25 LAB — PHOSPHORUS: Phosphorus: 4.3 mg/dL (ref 2.5–4.6)

## 2023-09-25 LAB — CBC
HCT: 38 % (ref 36.0–46.0)
Hemoglobin: 12 g/dL (ref 12.0–15.0)
MCH: 28.9 pg (ref 26.0–34.0)
MCHC: 31.6 g/dL (ref 30.0–36.0)
MCV: 91.6 fL (ref 80.0–100.0)
Platelets: 318 10*3/uL (ref 150–400)
RBC: 4.15 MIL/uL (ref 3.87–5.11)
RDW: 13 % (ref 11.5–15.5)
WBC: 6.3 10*3/uL (ref 4.0–10.5)
nRBC: 0 % (ref 0.0–0.2)

## 2023-09-25 LAB — HIV ANTIBODY (ROUTINE TESTING W REFLEX): HIV Screen 4th Generation wRfx: NONREACTIVE

## 2023-09-25 LAB — MAGNESIUM: Magnesium: 1.9 mg/dL (ref 1.7–2.4)

## 2023-09-25 MED ORDER — ASPIRIN 81 MG PO TBEC
81.0000 mg | DELAYED_RELEASE_TABLET | Freq: Every day | ORAL | Status: DC
  Administered 2023-09-25: 81 mg via ORAL
  Filled 2023-09-25: qty 1

## 2023-09-25 MED ORDER — ALBUTEROL SULFATE (2.5 MG/3ML) 0.083% IN NEBU: 3.0000 mL | INHALATION_SOLUTION | Freq: Four times a day (QID) | RESPIRATORY_TRACT | Status: DC | PRN

## 2023-09-25 MED ORDER — ALPRAZOLAM 0.5 MG PO TABS
0.5000 mg | ORAL_TABLET | Freq: Three times a day (TID) | ORAL | Status: DC | PRN
  Administered 2023-09-25 (×2): 0.5 mg via ORAL
  Filled 2023-09-25 (×2): qty 1

## 2023-09-25 MED ORDER — HYDROMORPHONE HCL 1 MG/ML IJ SOLN
0.5000 mg | INTRAMUSCULAR | Status: DC | PRN
  Administered 2023-09-25 (×2): 0.5 mg via INTRAVENOUS
  Filled 2023-09-25 (×2): qty 0.5

## 2023-09-25 MED ORDER — ZOLPIDEM TARTRATE 5 MG PO TABS
5.0000 mg | ORAL_TABLET | Freq: Every evening | ORAL | Status: DC | PRN
  Administered 2023-09-25: 5 mg via ORAL
  Filled 2023-09-25: qty 1

## 2023-09-25 MED ORDER — LACTATED RINGERS IV SOLN: INTRAVENOUS | Status: AC

## 2023-09-25 MED ORDER — METRONIDAZOLE 500 MG/100ML IV SOLN
500.0000 mg | Freq: Two times a day (BID) | INTRAVENOUS | Status: DC
  Administered 2023-09-25 – 2023-09-26 (×3): 500 mg via INTRAVENOUS
  Filled 2023-09-25 (×4): qty 100

## 2023-09-25 MED ORDER — MOMETASONE FURO-FORMOTEROL FUM 200-5 MCG/ACT IN AERO
2.0000 | INHALATION_SPRAY | Freq: Two times a day (BID) | RESPIRATORY_TRACT | Status: DC
  Administered 2023-09-25 – 2023-09-27 (×4): 2 via RESPIRATORY_TRACT
  Filled 2023-09-25: qty 8.8

## 2023-09-25 MED ORDER — LOSARTAN POTASSIUM 50 MG PO TABS
25.0000 mg | ORAL_TABLET | Freq: Every day | ORAL | Status: DC
  Administered 2023-09-25: 25 mg via ORAL
  Filled 2023-09-25: qty 1

## 2023-09-25 MED ORDER — SODIUM CHLORIDE 0.9 % IV SOLN
2.0000 g | Freq: Once | INTRAVENOUS | Status: AC
  Administered 2023-09-25: 2 g via INTRAVENOUS
  Filled 2023-09-25: qty 20

## 2023-09-25 MED ORDER — PNEUMOCOCCAL 20-VAL CONJ VACC 0.5 ML IM SUSY: 0.5000 mL | PREFILLED_SYRINGE | INTRAMUSCULAR | Status: DC

## 2023-09-25 MED ORDER — ROPINIROLE HCL 1 MG PO TABS: 1.0000 mg | ORAL_TABLET | Freq: Every evening | ORAL | Status: DC | PRN

## 2023-09-25 MED ORDER — SODIUM CHLORIDE 0.9 % IV SOLN
2.0000 g | INTRAVENOUS | Status: DC
  Administered 2023-09-25 – 2023-09-26 (×2): 2 g via INTRAVENOUS
  Filled 2023-09-25 (×2): qty 20

## 2023-09-25 MED ORDER — FLUOXETINE HCL 20 MG PO CAPS
40.0000 mg | ORAL_CAPSULE | Freq: Every day | ORAL | Status: DC
  Administered 2023-09-25: 40 mg via ORAL
  Filled 2023-09-25: qty 2

## 2023-09-25 NOTE — Consult Note (Signed)
[facility] Consult  Reason for Consult: Acute cholecystitis Referring Physician: Dr. Bernette Mayers  Chief Complaint   Chest Pain     HPI: [name] is a 57 y.o. female who presented to the hospital with a 1 day history of epigastric abdominal pain radiating into her back.  She was at work yesterday and had significant abdominal pain.  She denied any nausea or vomiting at home, but did have an episode of emesis when she presented to the hospital.  She still has a little bit of pain this morning, though it is better.  She does feel hungry.  She has never had an episode like this in the past.  Her past medical history is significant for anxiety, carotid artery disease, coronary artery disease status post stent placement, COPD, depression, GERD, hyperlipidemia, hypertension, sleep apnea, and stroke.  She is only taking an 81 mg aspirin daily with her last dose being yesterday.  Her surgical history is significant for a tubal ligation and CABG.  In the ED, she was hemodynamically stable.  She has no leukocytosis and LFTs were all within normal limits.  She underwent a CT of the abdomen and pelvis which demonstrated mild gallbladder wall thickening and edema which could possibly represent acute cholecystitis.  Abdominal ultrasound has been performed this morning but not yet read by radiology.  Past Medical History:  Diagnosis Date   ADD (attention deficit disorder with hyperactivity)    Anxiety    Bipolar 1 disorder (HCC)    Carotid artery disease (HCC)    Right 50-69% stenosis by doppler 2010   Chest pain 04/23/2021   Chronic diastolic (congestive) heart failure (HCC)    COPD (chronic obstructive pulmonary disease) (HCC)    Phreesia 12/15/2020   Coronary artery disease    a. s/p CABG in 2009 with LIMA-LAD, SVG-RI, and SVG-RCA b. low-risk NST in 10/2015 c. low-risk NST in 12/2018.   CVA (cerebral infarction) 12/2008   Reports a neurologist told her she had a blood clot in  her brain   Depression    Depression    Phreesia 12/15/2020   Emphysema of lung (HCC) 06/19/2018   Fibromyalgia    GERD (gastroesophageal reflux disease)    Hyperlipidemia    Hypertension    Myocardial infarction (HCC) 09/18/2011   Phreesia 12/15/2020   OSA on CPAP 12/31/2018   Preventative health care 07/16/2022   Sleep apnea    Phreesia 12/15/2020   Stroke (HCC)    Phreesia 12/15/2020   Tobacco abuse    Upper respiratory tract infection 11/09/2021    Past Surgical History:  Procedure Laterality Date   CARDIAC CATHETERIZATION     CORONARY ARTERY BYPASS GRAFT  2009   3V CABG LIMA --> LAD, SVG --> Ramus Intermediate,  SVG --> RCA   LEFT HEART CATH AND CORS/GRAFTS ANGIOGRAPHY N/A 04/24/2021   Procedure: LEFT HEART CATH AND CORS/GRAFTS ANGIOGRAPHY;  Surgeon: Kathleene Hazel, MD;  Location: [facility];  Service: Cardiovascular;  Laterality: N/A;   TUBAL LIGATION  1990    Family History  Problem Relation Age of Onset   CAD Mother    Hypertension Mother    Stroke Mother    Heart disease Father    Diabetes type II Father    Intellectual disability Maternal Aunt    Intellectual disability Maternal Uncle    Alcohol abuse Paternal Grandfather    Colon cancer Neg Hx    Breast cancer Neg Hx    Lung cancer  Neg Hx     Social History   Tobacco Use   Smoking status: Former    Current packs/day: 0.00    Average packs/day: 1 pack/day for 38.0 years (38.0 ttl pk-yrs)    Types: Cigarettes    Start date: 12/14/1981    Quit date: 12/17/2018    Years since quitting: 4.7   Smokeless tobacco: Never  Vaping Use   Vaping status: Never Used  Substance Use Topics   Alcohol use: Not Currently    Comment: rarely- had a margarita on birthday   Drug use: No    Medications: I have reviewed the patient's current medications.  Allergies  Allergen Reactions   Other    Imdur [Isosorbide Nitrate] Other (See Comments)    Headaches; Worsening Chest Pain     ROS:   Pertinent items are noted in HPI.  Blood pressure (!) 134/55, pulse (!) 49, temperature 97.9 F (36.6 C), temperature source Oral, resp. rate 18, height 5\' 2"  (1.575 m), weight 54.9 kg, last menstrual period 10/22/2015, SpO2 97%. Physical Exam Vitals reviewed.  Constitutional:      Appearance: She is well-developed.  HENT:     Head: Normocephalic and atraumatic.  Eyes:     Extraocular Movements: Extraocular movements intact.     Pupils: Pupils are equal, round, and reactive to light.  Cardiovascular:     Rate and Rhythm: Normal rate.  Pulmonary:     Breath sounds: Normal breath sounds.  Abdominal:     Comments: Abdomen soft, nondistended, no percussion tenderness, mild right upper quadrant tenderness to palpation; no rigidity, guarding, rebound tenderness; negative Murphy sign  Musculoskeletal:        General: Normal range of motion.     Cervical back: Normal range of motion.  Skin:    General: Skin is warm and dry.  Neurological:     General: No focal deficit present.     Mental Status: She is alert and oriented to person, place, and time.  Psychiatric:        Mood and Affect: Mood normal.        Behavior: Behavior normal.     Results: Results for orders placed or performed during the hospital encounter of 09/24/23 (from the past 48 hour(s))  Basic metabolic panel     Status: Abnormal   Collection Time: 09/24/23  8:45 PM  Result Value Ref Range   Sodium 134 (L) 135 - 145 mmol/L   Potassium 3.6 3.5 - 5.1 mmol/L   Chloride 102 98 - 111 mmol/L   CO2 21 (L) 22 - 32 mmol/L   Glucose, Bld 103 (H) 70 - 99 mg/dL    Comment: Glucose reference range applies only to samples taken after fasting for at least 8 hours.   BUN 13 6 - 20 mg/dL   Creatinine, Ser 3.61 0.44 - 1.00 mg/dL   Calcium 9.4 8.9 - 44.3 mg/dL   GFR, Estimated >15 >40 mL/min    Comment: (NOTE) Calculated using the CKD-EPI Creatinine Equation (2021)    Anion gap 11 5 - 15    Comment: Performed at [facility], [address] [place], [place] [address]  CBC     Status: None   Collection Time: 09/24/23  8:45 PM  Result Value Ref Range   WBC 5.7 4.0 - 10.5 K/uL   RBC 4.46 3.87 - 5.11 MIL/uL   Hemoglobin 13.1 12.0 - 15.0 g/dL   HCT 19.5 09.3 - 26.7 %   MCV 89.5 80.0 -  100.0 fL   MCH 29.4 26.0 - 34.0 pg   MCHC 32.8 30.0 - 36.0 g/dL   RDW 81.1 91.4 - 78.2 %   Platelets 361 150 - 400 K/uL   nRBC 0.0 0.0 - 0.2 %    Comment: Performed at [facility], [address], [place], [place] [address]  Troponin I (High Sensitivity)     Status: None   Collection Time: 09/24/23  8:45 PM  Result Value Ref Range   Troponin I (High Sensitivity) 4 <18 ng/L    Comment: (NOTE) Elevated high sensitivity troponin I (hsTnI) values and significant  changes across serial measurements may suggest ACS but many other  chronic and acute conditions are known to elevate hsTnI results.  Refer to the "Links" section for chest pain algorithms and additional  guidance. Performed at [facility], [address] [place], [place] [address]   Hepatic function panel     Status: None   Collection Time: 09/24/23  8:45 PM  Result Value Ref Range   Total Protein 7.7 6.5 - 8.1 g/dL   Albumin 4.1 3.5 - 5.0 g/dL   AST 19 15 - 41 U/L   ALT 14 0 - 44 U/L   Alkaline Phosphatase 88 38 - 126 U/L   Total Bilirubin 0.3 <1.2 mg/dL   Bilirubin, Direct <7.8 0.0 - 0.2 mg/dL   Indirect Bilirubin NOT CALCULATED 0.3 - 0.9 mg/dL    Comment: Performed at [facility], [address], [place], [place] [address]  Lipase, blood     Status: None   Collection Time: 09/24/23  8:45 PM  Result Value Ref Range   Lipase 51 11 - 51 U/L    Comment: Performed at [facility], [address] [place], [place] [address]  Magnesium     Status: None   Collection Time: 09/24/23  8:45 PM  Result Value Ref Range   Magnesium 1.8 1.7 - 2.4 mg/dL    Comment: Performed at [facility], [address], [place], [place] [address]  Troponin I (High Sensitivity)      Status: None   Collection Time: 09/24/23 10:41 PM  Result Value Ref Range   Troponin I (High Sensitivity) 4 <18 ng/L    Comment: (NOTE) Elevated high sensitivity troponin I (hsTnI) values and significant  changes across serial measurements may suggest ACS but many other  chronic and acute conditions are known to elevate hsTnI results.  Refer to the "Links" section for chest pain algorithms and additional  guidance. Performed at [facility], [address], [place], [place] [address]   Comprehensive metabolic panel     Status: Abnormal   Collection Time: 09/25/23  6:02 AM  Result Value Ref Range   Sodium 138 135 - 145 mmol/L   Potassium 3.9 3.5 - 5.1 mmol/L   Chloride 102 98 - 111 mmol/L   CO2 27 22 - 32 mmol/L   Glucose, Bld 101 (H) 70 - 99 mg/dL    Comment: Glucose reference range applies only to samples taken after fasting for at least 8 hours.   BUN 11 6 - 20 mg/dL   Creatinine, Ser 2.53 0.44 - 1.00 mg/dL   Calcium 9.3 8.9 - 66.4 mg/dL   Total Protein 6.8 6.5 - 8.1 g/dL   Albumin 3.6 3.5 - 5.0 g/dL   AST 17 15 - 41 U/L   ALT 12 0 - 44 U/L   Alkaline Phosphatase 76 38 - 126 U/L   Total Bilirubin 0.4 <1.2 mg/dL  GFR, Estimated >60 >60 mL/min    Comment: (NOTE) Calculated using the CKD-EPI Creatinine Equation (2021)    Anion gap 9 5 - 15    Comment: Performed at [facility], [address] [place], [place] [address]  CBC     Status: None   Collection Time: 09/25/23  6:02 AM  Result Value Ref Range   WBC 6.3 4.0 - 10.5 K/uL   RBC 4.15 3.87 - 5.11 MIL/uL   Hemoglobin 12.0 12.0 - 15.0 g/dL   HCT 56.3 87.5 - 64.3 %   MCV 91.6 80.0 - 100.0 fL   MCH 28.9 26.0 - 34.0 pg   MCHC 31.6 30.0 - 36.0 g/dL   RDW 32.9 51.8 - 84.1 %   Platelets 318 150 - 400 K/uL   nRBC 0.0 0.0 - 0.2 %    Comment: Performed at [facility], [address], [place], [place] [address]  Magnesium     Status: None   Collection Time: 09/25/23  6:02 AM  Result Value Ref Range   Magnesium 1.9 1.7 - 2.4  mg/dL    Comment: Performed at [facility], [address], [place], [place] [address]  Phosphorus     Status: None   Collection Time: 09/25/23  6:02 AM  Result Value Ref Range   Phosphorus 4.3 2.5 - 4.6 mg/dL    Comment: Performed at [facility], [address], [place], [place] [address]    CT ABDOMEN PELVIS W CONTRAST  Result Date: 09/25/2023 CLINICAL DATA:  Biliary obstruction suspected. Sudden onset abdominal and chest pain. EXAM: CT ABDOMEN AND PELVIS WITH CONTRAST TECHNIQUE: Multidetector CT imaging of the abdomen and pelvis was performed using the standard protocol following bolus administration of intravenous contrast. RADIATION DOSE REDUCTION: This exam was performed according to the departmental dose-optimization program which includes automated exposure control, adjustment of the mA and/or kV according to patient size and/or use of iterative reconstruction technique. CONTRAST:  OMNIPAQUE IOHEXOL 300 MG/ML  SOLN COMPARISON:  12/04/2009 FINDINGS: Lower chest: Lung bases are clear. Hepatobiliary: Diffuse fatty infiltration of the liver. No focal liver lesions. Gallbladder wall thickening and edema. This may indicate acute cholecystitis. No stones are identified. No bile duct dilatation. Pancreas: Unremarkable. No pancreatic ductal dilatation or surrounding inflammatory changes. Spleen: Normal in size without focal abnormality. Adrenals/Urinary Tract: Adrenal glands are unremarkable. Kidneys are normal, without renal calculi, focal lesion, or hydronephrosis. Bladder is unremarkable. Stomach/Bowel: Stomach is within normal limits. Appendix appears normal. No evidence of bowel wall thickening, distention, or inflammatory changes. Vascular/Lymphatic: Aortic atherosclerosis. No enlarged abdominal or pelvic lymph nodes. Reproductive: Uterus and bilateral adnexa are unremarkable. Other: No abdominal wall hernia or abnormality. No abdominopelvic ascites. Musculoskeletal: No acute or significant  osseous findings. IMPRESSION: 1. Mild gallbladder wall thickening and edema. This may represent acute cholecystitis. No stones or bile duct dilatation identified. 2. No evidence of bowel obstruction or inflammation. 3. Diffuse fatty infiltration of the liver. 4. Aortic atherosclerosis. Electronically Signed   By: Burman Nieves M.D.   On: 09/25/2023 00:09   DG Chest 2 View  Result Date: 09/24/2023 CLINICAL DATA:  Chest pain EXAM: CHEST - 2 VIEW COMPARISON:  CXR 04/23/21. CT chest 06/18/2022 FINDINGS: The heart and mediastinal contours are unchanged. Atherosclerotic plaque. Hyperinflation of the lungs. Biapical pleural/pulmonary scarring. No focal consolidation. No pulmonary edema. No pleural effusion. No pneumothorax. No acute osseous abnormality. Sternotomy wires intact. IMPRESSION: 1. No active cardiopulmonary disease. 2.  Aortic Atherosclerosis (ICD10-I70.0). Electronically Signed   By:  Tish Frederickson M.D.   On: 09/24/2023 21:44     Assessment & Plan:  [name] is a 57 y.o. female who was admitted with concern for acute cholecystitis.  Imaging and blood work evaluated by myself.  -On my review of the CT of the abdomen pelvis, the patient does have some mild fluid around her gallbladder.  Ultrasound has been performed and on my review, it appears somewhat equivocal for acute cholecystitis.  Given the patient's normal LFTs and no leukocytosis, I discussed with the patient that we could potentially discharge her for outpatient cholecystectomy versus keeping her inpatient and getting her gallbladder out during this admission.  She expressed that she would like to proceed with surgery during this admission. -I counseled the patient about the indication, risks and benefits of robotic laparoscopic cholecystectomy.  She understands there is a very small chance for bleeding, infection, injury to normal structures (including common bile duct), conversion to open surgery, persistent symptoms, evolution  of postcholecystectomy diarrhea, need for secondary interventions, anesthesia reaction, cardiopulmonary issues and other risks not specifically detailed here. I described the expected recovery, the plan for follow-up and the restrictions during the recovery phase.  All questions were answered. -Will tentatively plan for robotic assisted laparoscopic cholecystectomy tomorrow -Okay for low-fat diet today -Continue IV Rocephin and Flagyl -NPO at midnight -PRN pain control and antiemetics -Appreciate hospitalist recommendations  All questions were answered to the satisfaction of the patient and family.  -- Theophilus Kinds, DO [facility] [address] [place], [place] [address] [phone] (office)   '

## 2023-09-25 NOTE — H&P (Signed)
History and Physical    Patient: [name]:[id number] DOB: 20-Jun-1966 DOA: 09/24/2023 DOS: the patient was seen and examined on 09/25/2023 PCP: Billie Lade, MD  Patient coming from: Home  Chief Complaint:  Chief Complaint  Patient presents with   Chest Pain   HPI: [name] is a 56 y.o. female with medical history significant of hypertension, hyperlipidemia, CAD s/p CABG x 3 (2009), COPD, diastolic CHF, history of stroke who presents to the emergency department due to sudden onset of chest pain and abdominal pain with radiation into her back which started as she was able to start and shift at work.  Pain was rated as 10/10 on pain scale, and it was crampy in nature in the substernal and epigastric region with radiation to the back, this was associated with several episodes of nonbloody vomiting after arrival to the ED.  She denies fever, shortness of breath, diarrhea.  ED Course:  In the emergency department, BP was 153/63, pulse 52, other vital signs were within normal range.  Workup in the ED showed normal CBC, and BMP except for sodium of 134, bicarb 21, blood glucose 103, magnesium 1.8, lipase 51, hepatic function panel was normal, troponin x 2 was flat at 4. Chest x-ray showed no active cardiopulmonary disease CT abdomen and pelvis with contrast showed mild gallbladder wall thickening and edema.  This may represent acute cholecystitis.  No stones or bile duct dilatation identified.  No evidence of bowel obstruction or inflammation.  Diffuse fatty infiltration of the liver. Patient was treated with IV ceftriaxone, respiratory 25 mg x 1 was given, morphine was provided and Zofran was given due to vomiting. General surgery was consulted and will follow-up with patient in the morning per EDP Hospitalist was asked to admit patient for further evaluation and management.  Review of Systems: Review of systems as noted in the HPI. All other systems reviewed and are  negative.   Past Medical History:  Diagnosis Date   ADD (attention deficit disorder with hyperactivity)    Anxiety    Bipolar 1 disorder (HCC)    Carotid artery disease (HCC)    Right 50-69% stenosis by doppler 2010   Chest pain 04/23/2021   Chronic diastolic (congestive) heart failure (HCC)    COPD (chronic obstructive pulmonary disease) (HCC)    Phreesia 12/15/2020   Coronary artery disease    a. s/p CABG in 2009 with LIMA-LAD, SVG-RI, and SVG-RCA b. low-risk NST in 10/2015 c. low-risk NST in 12/2018.   CVA (cerebral infarction) 12/2008   Reports a neurologist told her she had a blood clot in her brain   Depression    Depression    Phreesia 12/15/2020   Emphysema of lung (HCC) 06/19/2018   Fibromyalgia    GERD (gastroesophageal reflux disease)    Hyperlipidemia    Hypertension    Myocardial infarction (HCC) 09/18/2011   Phreesia 12/15/2020   OSA on CPAP 12/31/2018   Preventative health care 07/16/2022   Sleep apnea    Phreesia 12/15/2020   Stroke (HCC)    Phreesia 12/15/2020   Tobacco abuse    Upper respiratory tract infection 11/09/2021   Past Surgical History:  Procedure Laterality Date   CARDIAC CATHETERIZATION     CORONARY ARTERY BYPASS GRAFT  2009   3V CABG LIMA --> LAD, SVG --> Ramus Intermediate,  SVG --> RCA   LEFT HEART CATH AND CORS/GRAFTS ANGIOGRAPHY N/A 04/24/2021   Procedure: LEFT HEART CATH AND CORS/GRAFTS ANGIOGRAPHY;  Surgeon:  Kathleene Hazel, MD;  Location: [facility];  Service: Cardiovascular;  Laterality: N/A;   TUBAL LIGATION  1990    Social History:  reports that she quit smoking about 4 years ago. Her smoking use included cigarettes. She started smoking about 41 years ago. She has a 38 pack-year smoking history. She has never used smokeless tobacco. She reports that she does not currently use alcohol. She reports that she does not use drugs.   Allergies  Allergen Reactions   Other    Imdur [Isosorbide Nitrate] Other (See  Comments)    Headaches; Worsening Chest Pain    Family History  Problem Relation Age of Onset   CAD Mother    Hypertension Mother    Stroke Mother    Heart disease Father    Diabetes type II Father    Intellectual disability Maternal Aunt    Intellectual disability Maternal Uncle    Alcohol abuse Paternal Grandfather    Colon cancer Neg Hx    Breast cancer Neg Hx    Lung cancer Neg Hx      Prior to Admission medications   Medication Sig Start Date End Date Taking? Authorizing Provider  Albuterol Sulfate (PROAIR RESPICLICK) 108 (90 Base) MCG/ACT AEPB Inhale 2 puffs into the lungs every 6 (six) hours as needed. 05/10/23   Gardenia Phlegm, MD  ALPRAZolam Prudy Feeler) 0.5 MG tablet TAKE 1 TABLET BY MOUTH THREE TIMES DAILY AS NEEDED 05/06/23   Mozingo, Thereasa Solo, NP  aspirin EC 81 MG tablet Take 81 mg by mouth daily.    [provider]  budesonide-formoterol [facility]) 160-4.5 MCG/ACT inhaler 1-2 puffs every 12 hours 03/10/22   Nyoka Cowden, MD  buPROPion (WELLBUTRIN XL) 150 MG 24 hr tablet TAKE 1 TABLET BY MOUTH DAILY 09/19/23   Mozingo, Thereasa Solo, NP  Cholecalciferol (VITAMIN D3 PO) Take 1 tablet by mouth daily.    [provider]  cyclobenzaprine (FLEXERIL) 10 MG tablet Take 10 mg by mouth 2 (two) times daily as needed for muscle spasms.     [provider]  diphenoxylate-atropine (LOMOTIL) 2.5-0.025 MG tablet Take 2.5 mg by mouth daily as needed.     [provider]  esomeprazole (NEXIUM) 40 MG capsule TAKE ONE CAPSULE BY MOUTH DAILY 07/25/23   Billie Lade, MD  Evolocumab (REPATHA SURECLICK) 140 MG/ML SOAJ Inject 140 mg into the skin every 14 (fourteen) days. 06/15/23   Antoine Poche, MD  Fezolinetant 45 MG TABS Take 1 tablet (45 mg total) by mouth daily at 6 (six) AM. 05/10/23   Gardenia Phlegm, MD  FLUoxetine (PROZAC) 40 MG capsule Take 1 capsule (40 mg total) by mouth 2 (two) times daily. 12/30/22   Mozingo, Thereasa Solo, NP   fluticasone (FLONASE) 50 MCG/ACT nasal spray Place 2 sprays into both nostrils daily. 11/09/21   Donell Beers, FNP  furosemide (LASIX) 40 MG tablet Take 1 tablet (40 mg total) by mouth daily. 12/10/22   Antoine Poche, MD  ibuprofen (ADVIL) 800 MG tablet Take 800 mg by mouth every 8 (eight) hours as needed. 01/12/20   [provider]  losartan (COZAAR) 25 MG tablet Take 1 tablet (25 mg total) by mouth daily. 06/15/23   Antoine Poche, MD  Multiple Vitamins-Minerals (CENTRUM SILVER PO) Take 1 tablet by mouth daily.    [provider]  nitroGLYCERIN (NITROSTAT) 0.4 MG SL tablet Place 1 tablet (0.4 mg total) under the tongue every 5 (five) minutes  x 3 doses as needed for chest pain (if no relief after 3rd dose, proceed to the ED for an evaluation). 06/15/23   Antoine Poche, MD  ondansetron (ZOFRAN) 4 MG tablet Take 4 mg by mouth every 8 (eight) hours as needed. 11/01/19   [provider]  potassium chloride (KLOR-CON) 20 MEQ packet Take 20 mEq by mouth 2 (two) times daily. 12/06/18   Laqueta Linden, MD  ranolazine (RANEXA) 1000 MG SR tablet Take 1 tablet (1,000 mg total) by mouth 2 (two) times daily. 06/15/23   Antoine Poche, MD  rOPINIRole (REQUIP) 1 MG tablet TAKE 1 TABLET BY MOUTH AT BEDTIME AS NEEDED 03/14/23   Billie Lade, MD  vitamin B-12 (CYANOCOBALAMIN) 100 MCG tablet Take 100 mcg by mouth daily.    [provider]  zolpidem (AMBIEN) 10 MG tablet TAKE 1 TABLET BY MOUTH AT BEDTIME 05/06/23   Mozingo, Thereasa Solo, NP    Physical Exam: BP (!) 140/62 (BP Location: Left Arm)   Pulse (!) 48   Temp 97.7 F (36.5 C) (Oral)   Resp 17   Ht 5\' 2"  (1.575 m)   Wt 54.9 kg   LMP 10/22/2015   SpO2 100%   BMI 22.13 kg/m   General: 57 y.o. year-old female well developed well nourished in no acute distress.  Alert and oriented x3. HEENT: NCAT, EOMI Neck: Supple, trachea medial Cardiovascular: Regular rate and rhythm with no rubs or  gallops.  No thyromegaly or JVD noted.  No lower extremity edema. 2/4 pulses in all 4 extremities. Respiratory: Clear to auscultation with no wheezes or rales. Good inspiratory effort. Abdomen: Soft, tender to palpation of epigastric and RUQ with positive Murphy sign.   Muskuloskeletal: No cyanosis, clubbing or edema noted bilaterally Neuro: CN II-XII intact, strength 5/5 x 4, sensation, reflexes intact Skin: No ulcerative lesions noted or rashes Psychiatry: Judgement and insight appear normal. Mood is appropriate for condition and setting          Labs on Admission:  Basic Metabolic Panel: Recent Labs  Lab 09/24/23 2045  NA 134*  K 3.6  CL 102  CO2 21*  GLUCOSE 103*  BUN 13  CREATININE 0.71  CALCIUM 9.4  MG 1.8   Liver Function Tests: Recent Labs  Lab 09/24/23 2045  AST 19  ALT 14  ALKPHOS 88  BILITOT 0.3  PROT 7.7  ALBUMIN 4.1   Recent Labs  Lab 09/24/23 2045  LIPASE 51   No results for input(s): "AMMONIA" in the last 168 hours. CBC: Recent Labs  Lab 09/24/23 2045  WBC 5.7  HGB 13.1  HCT 39.9  MCV 89.5  PLT 361   Cardiac Enzymes: No results for input(s): "CKTOTAL", "CKMB", "CKMBINDEX", "TROPONINI" in the last 168 hours.  BNP (last 3 results) No results for input(s): "BNP" in the last 8760 hours.  ProBNP (last 3 results) No results for input(s): "PROBNP" in the last 8760 hours.  CBG: No results for input(s): "GLUCAP" in the last 168 hours.  Radiological Exams on Admission: CT ABDOMEN PELVIS W CONTRAST  Result Date: 09/25/2023 CLINICAL DATA:  Biliary obstruction suspected. Sudden onset abdominal and chest pain. EXAM: CT ABDOMEN AND PELVIS WITH CONTRAST TECHNIQUE: Multidetector CT imaging of the abdomen and pelvis was performed using the standard protocol following bolus administration of intravenous contrast. RADIATION DOSE REDUCTION: This exam was performed according to the departmental dose-optimization program which includes automated exposure  control, adjustment of the mA and/or kV according to patient  size and/or use of iterative reconstruction technique. CONTRAST:  OMNIPAQUE IOHEXOL 300 MG/ML  SOLN COMPARISON:  12/04/2009 FINDINGS: Lower chest: Lung bases are clear. Hepatobiliary: Diffuse fatty infiltration of the liver. No focal liver lesions. Gallbladder wall thickening and edema. This may indicate acute cholecystitis. No stones are identified. No bile duct dilatation. Pancreas: Unremarkable. No pancreatic ductal dilatation or surrounding inflammatory changes. Spleen: Normal in size without focal abnormality. Adrenals/Urinary Tract: Adrenal glands are unremarkable. Kidneys are normal, without renal calculi, focal lesion, or hydronephrosis. Bladder is unremarkable. Stomach/Bowel: Stomach is within normal limits. Appendix appears normal. No evidence of bowel wall thickening, distention, or inflammatory changes. Vascular/Lymphatic: Aortic atherosclerosis. No enlarged abdominal or pelvic lymph nodes. Reproductive: Uterus and bilateral adnexa are unremarkable. Other: No abdominal wall hernia or abnormality. No abdominopelvic ascites. Musculoskeletal: No acute or significant osseous findings. IMPRESSION: 1. Mild gallbladder wall thickening and edema. This may represent acute cholecystitis. No stones or bile duct dilatation identified. 2. No evidence of bowel obstruction or inflammation. 3. Diffuse fatty infiltration of the liver. 4. Aortic atherosclerosis. Electronically Signed   By: Burman Nieves M.D.   On: 09/25/2023 00:09   DG Chest 2 View  Result Date: 09/24/2023 CLINICAL DATA:  Chest pain EXAM: CHEST - 2 VIEW COMPARISON:  CXR 04/23/21. CT chest 06/18/2022 FINDINGS: The heart and mediastinal contours are unchanged. Atherosclerotic plaque. Hyperinflation of the lungs. Biapical pleural/pulmonary scarring. No focal consolidation. No pulmonary edema. No pleural effusion. No pneumothorax. No acute osseous abnormality. Sternotomy wires intact.  IMPRESSION: 1. No active cardiopulmonary disease. 2.  Aortic Atherosclerosis (ICD10-I70.0). Electronically Signed   By: Tish Frederickson M.D.   On: 09/24/2023 21:44    EKG: I independently viewed the EKG done and my findings are as followed: Sinus bradycardia at a rate of 52 bpm  Assessment/Plan Present on Admission:  Acute cholecystitis  CAD (coronary artery disease)  RESTLESS LEG SYNDROME  Essential hypertension  COPD (chronic obstructive pulmonary disease) (HCC)  Mixed hyperlipidemia  Principal Problem:   Acute cholecystitis Active Problems:   RESTLESS LEG SYNDROME   Essential hypertension   CAD (coronary artery disease)   Mixed hyperlipidemia   COPD (chronic obstructive pulmonary disease) (HCC)   Abdominal pain   Sinus bradycardia  Acute cholecystitis Abdominal pain in the setting of above Patient will be admitted to MedSurg unit Continue IV LR at 60 mLs/Hr Patient was started on IV ceftriaxone, we shall continue ceftriaxone and metronidazole Continue IV Dilaudid 0.5 mg q.3h p.r.n. for moderate to severe pain Continue IV Zofran p.r.n. RUQ ultrasound will be done in the morning Patient will be placed n.p.o. in anticipation for possible surgical intervention in the morning General Surgery was consulted to follow up with patient in the morning per EDP  Sinus bradycardia Patient has known baseline bradycardia and HR's in the 40s to 50s per cardiology medical record Avoid AV nodal blocking agents Stable, continue telemetry EKG personally reviewed showed sinus bradycardia at a rate of 52 bpm  Essential hypertension Continue losartan   CAD s/p CABG x 3 Continue aspirin, Repatha  COPD Continue albuterol, Dulera  Mixed hyperlipidemia Patient takes Repatha  Restless leg syndrome Continue ropinirole   Bipolar disorder Continue Xanax as needed,  DVT prophylaxis: SCDs  Code Status: Full code  Family Communication: None at bedside  Consults: General  Surgery  Severity of Illness: The appropriate patient status for this patient is INPATIENT. Inpatient status is judged to be reasonable and necessary in order to provide the required intensity of service to  ensure the patient's safety. The patient's presenting symptoms, physical exam findings, and initial radiographic and laboratory data in the context of their chronic comorbidities is felt to place them at high risk for further clinical deterioration. Furthermore, it is not anticipated that the patient will be medically stable for discharge from the hospital within 2 midnights of admission.   * I certify that at the point of admission it is my clinical judgment that the patient will require inpatient hospital care spanning beyond 2 midnights from the point of admission due to high intensity of service, high risk for further deterioration and high frequency of surveillance required.*  Author: Frankey Shown, DO 09/25/2023 6:01 AM  For on call review [email or web address].

## 2023-09-25 NOTE — Progress Notes (Addendum)
PROGRESS NOTE  [name] YQM:[id number] DOB: 08/08/1966 DOA: 09/24/2023 PCP: Billie Lade, MD  Brief History:  57 year old female with history of coronary disease status post CABG times 3 in 2009, hypertension, hyperlipidemia, COPD, diastolic CHF presenting with epigastric and chest pain that started wile she was at work on 09/24/2023.  The patient states that her epigastric pain wrapping to the right and into her back.  She has some and vomiting without any blood.  She has never had this pain before.  She describes the pain feeling like a muscle cramp.  she denies any fevers, chills, shortness breath, coughing, hemoptysis.  She denies any anginal type symptoms prior to admission.  She denies any diarrhea, hematochezia, melena.  There is no dysuria or hematuria. In the ED, the patient was afebrile hemodynamically stable with oxygen saturation 100% room air.  WBC 5.7, hemoglobin 13.1, platelets 318.  Sodium 134, potassium 3.6, bicarbonate 21, serum creatinine 0.71.  LFTs unremarkable.  CT of the abdomen and pelvis showed multiple bladder wall thickening with edema.  There was no ductal dilatation.  General surgery was consulted.  Right upper quadrant ultrasound was ordered.   Assessment/Plan:  Cholecystitis -Concerned about acute cholecystitis -Continue empiric ceftriaxone and metronidazole -Appreciate general surgery consult -Plan for laparoscopic cholecystectomy 09/26/23 -Discussed with Dr. Robyne Peers  Coronary disease -cp not consistent with angina/ACS -troponin 4>>4 -Continue aspirin -Personally reviewed EKG--sinus rhythm, nonspecific T wave change  Chronic diastolic heart failure -Clinically euvolemic -Temporarily holding furosemide -Stable on room air -04/24/2021 echo EF 65-70%, no WMA, normal RVF, trivial MR  COPD -Stable on room air -Patient continues to vape -Continue Dulera  Mixed hyperlipidemia -Patient is on Repatha  Essential  hypertension -Continue losartan  Generalized anxiety -Continue fluoxetine alprazolam      Family Communication:  no Family at bedside  Consultants:  none  Code Status:  FULL  DVT Prophylaxis:  Plain Heparin / [place] Lovenox   Procedures: As Listed in Progress Note Above  Antibiotics: Ceftriaxone 12/1>> Flagyl 12/1>>   Total time spent 50 minutes.  Greater than 50% spent face to face counseling and coordinating care.    Subjective: Patient denies fevers, chills, headache, chest pain, dyspnea, nausea, vomiting, diarrhea,  dysuria, hematuria, hematochezia, and melena.   Objective: Vitals:   09/25/23 0144 09/25/23 0508 09/25/23 0850 09/25/23 1114  BP: (!) 161/55 (!) 140/62  (!) 134/55  Pulse: (!) 44 (!) 48  (!) 49  Resp: 16 17  18   Temp: 97.6 F (36.4 C) 97.7 F (36.5 C)  97.9 F (36.6 C)  TempSrc: Axillary Oral  Oral  SpO2: 100% 100% 95% 97%  Weight: 54.9 kg     Height: 5\' 2"  (1.575 m)       Intake/Output Summary (Last 24 hours) at 09/25/2023 1250 Last data filed at 09/25/2023 0132 Gross per 24 hour  Intake 100 ml  Output --  Net 100 ml   Weight change:  Exam:  General:  Pt is alert, follows commands appropriately, not in acute distress HEENT: No icterus, No thrush, No neck mass, [place]/AT Cardiovascular: RRR, S1/S2, no rubs, no gallops Respiratory: diminished BS.  Bibasilar rales. No wheeze Abdomen: Soft/+BS, epigastric tender, non distended, no guarding Extremities: No edema, No lymphangitis, No petechiae, No rashes, no synovitis   Data Reviewed: I have personally reviewed following labs and imaging studies Basic Metabolic Panel: Recent Labs  Lab 09/24/23 2045 09/25/23 0602  NA 134* 138  K 3.6  3.9  CL 102 102  CO2 21* 27  GLUCOSE 103* 101*  BUN 13 11  CREATININE 0.71 0.71  CALCIUM 9.4 9.3  MG 1.8 1.9  PHOS  --  4.3   Liver Function Tests: Recent Labs  Lab 09/24/23 2045 09/25/23 0602  AST 19 17  ALT 14 12  ALKPHOS 88 76  BILITOT 0.3 0.4   PROT 7.7 6.8  ALBUMIN 4.1 3.6   Recent Labs  Lab 09/24/23 2045  LIPASE 51   No results for input(s): "AMMONIA" in the last 168 hours. Coagulation Profile: No results for input(s): "INR", "PROTIME" in the last 168 hours. CBC: Recent Labs  Lab 09/24/23 2045 09/25/23 0602  WBC 5.7 6.3  HGB 13.1 12.0  HCT 39.9 38.0  MCV 89.5 91.6  PLT 361 318   Cardiac Enzymes: No results for input(s): "CKTOTAL", "CKMB", "CKMBINDEX", "TROPONINI" in the last 168 hours. BNP: Invalid input(s): "POCBNP" CBG: No results for input(s): "GLUCAP" in the last 168 hours. HbA1C: No results for input(s): "HGBA1C" in the last 72 hours. Urine analysis:    Component Value Date/Time   COLORURINE YELLOW 12/03/2009 2300   APPEARANCEUR CLEAR 12/03/2009 2300   LABSPEC 1.024 12/03/2009 2300   PHURINE 6.0 12/03/2009 2300   GLUCOSEU NEGATIVE 12/03/2009 2300   HGBUR NEGATIVE 12/03/2009 2300   BILIRUBINUR NEGATIVE 12/03/2009 2300   KETONESUR 15 (A) 12/03/2009 2300   PROTEINUR NEGATIVE 12/03/2009 2300   UROBILINOGEN 1.0 12/03/2009 2300   NITRITE NEGATIVE 12/03/2009 2300   LEUKOCYTESUR  12/03/2009 2300    NEGATIVE MICROSCOPIC NOT DONE ON URINES WITH NEGATIVE PROTEIN, BLOOD, LEUKOCYTES, NITRITE, OR GLUCOSE <1000 mg/dL.   Sepsis Labs: @LABRCNTIP (procalcitonin:4,lacticidven:4) )No results found for this or any previous visit (from the past 240 hour(s)).   Scheduled Meds:  aspirin EC  81 mg Oral Daily   losartan  25 mg Oral Daily   mometasone-formoterol  2 puff Inhalation BID   Continuous Infusions:  cefTRIAXone (ROCEPHIN)  IV 2 g (09/25/23 1130)   lactated ringers 60 mL/hr at 09/25/23 0552   metronidazole      Procedures/Studies: US Abdomen Limited  Result Date: 09/25/2023 CLINICAL DATA:  Suggestion potential gallbladder wall thickening/edema by CT of the abdomen. EXAM: ULTRASOUND ABDOMEN LIMITED RIGHT UPPER QUADRANT COMPARISON:  CT of the abdomen on 09/24/2023 FINDINGS: Gallbladder: There are some  tiny mobile gallstones in the gallbladder. Gallbladder wall is not overtly thickened measuring up to approximately 3 mm anteriorly. No sonographic Eulah Pont sign was elicited. No pericholecystic fluid is identified. Common bile duct: Diameter: 4 mm. Liver: The liver demonstrates mildly increased echogenicity, likely reflecting steatosis. No overt cirrhotic contour abnormalities or focal lesions are identified. There is no evidence of intrahepatic biliary ductal dilatation. Portal vein is patent on color Doppler imaging with normal direction of blood flow towards the liver. Other: No ascites identified in the right upper quadrant. IMPRESSION: 1. Cholelithiasis with tiny gallstones present. No definitive evidence of acute cholecystitis by ultrasound. 2. Mildly increased echogenicity of the liver, likely reflecting steatosis. Electronically Signed   By: Irish Lack M.D.   On: 09/25/2023 12:21   CT ABDOMEN PELVIS W CONTRAST  Result Date: 09/25/2023 CLINICAL DATA:  Biliary obstruction suspected. Sudden onset abdominal and chest pain. EXAM: CT ABDOMEN AND PELVIS WITH CONTRAST TECHNIQUE: Multidetector CT imaging of the abdomen and pelvis was performed using the standard protocol following bolus administration of intravenous contrast. RADIATION DOSE REDUCTION: This exam was performed according to the departmental dose-optimization program which includes automated exposure control, adjustment  of the mA and/or kV according to patient size and/or use of iterative reconstruction technique. CONTRAST:  OMNIPAQUE IOHEXOL 300 MG/ML  SOLN COMPARISON:  12/04/2009 FINDINGS: Lower chest: Lung bases are clear. Hepatobiliary: Diffuse fatty infiltration of the liver. No focal liver lesions. Gallbladder wall thickening and edema. This may indicate acute cholecystitis. No stones are identified. No bile duct dilatation. Pancreas: Unremarkable. No pancreatic ductal dilatation or surrounding inflammatory changes. Spleen: Normal in  size without focal abnormality. Adrenals/Urinary Tract: Adrenal glands are unremarkable. Kidneys are normal, without renal calculi, focal lesion, or hydronephrosis. Bladder is unremarkable. Stomach/Bowel: Stomach is within normal limits. Appendix appears normal. No evidence of bowel wall thickening, distention, or inflammatory changes. Vascular/Lymphatic: Aortic atherosclerosis. No enlarged abdominal or pelvic lymph nodes. Reproductive: Uterus and bilateral adnexa are unremarkable. Other: No abdominal wall hernia or abnormality. No abdominopelvic ascites. Musculoskeletal: No acute or significant osseous findings. IMPRESSION: 1. Mild gallbladder wall thickening and edema. This may represent acute cholecystitis. No stones or bile duct dilatation identified. 2. No evidence of bowel obstruction or inflammation. 3. Diffuse fatty infiltration of the liver. 4. Aortic atherosclerosis. Electronically Signed   By: Burman Nieves M.D.   On: 09/25/2023 00:09   DG Chest 2 View  Result Date: 09/24/2023 CLINICAL DATA:  Chest pain EXAM: CHEST - 2 VIEW COMPARISON:  CXR 04/23/21. CT chest 06/18/2022 FINDINGS: The heart and mediastinal contours are unchanged. Atherosclerotic plaque. Hyperinflation of the lungs. Biapical pleural/pulmonary scarring. No focal consolidation. No pulmonary edema. No pleural effusion. No pneumothorax. No acute osseous abnormality. Sternotomy wires intact. IMPRESSION: 1. No active cardiopulmonary disease. 2.  Aortic Atherosclerosis (ICD10-I70.0). Electronically Signed   By: Tish Frederickson M.D.   On: 09/24/2023 21:44    Catarina Hartshorn, DO  [facility]  If 7PM-7AM, please contact night-coverage [email or web address] Password [email or web address] 09/25/2023, 12:50 PM   LOS: 0 days

## 2023-09-25 NOTE — Plan of Care (Signed)
  Problem: Clinical Measurements: Goal: Ability to maintain clinical measurements within normal limits will improve Outcome: Progressing   

## 2023-09-25 NOTE — ED Provider Notes (Signed)
Care assumed at shift change. Patient with prior history of CAD s/p CABG 2009 here with epigastric pain. Labs are unremarkable. Pending CT to evaluate cholecystitis.  Physical Exam  BP (!) 143/63   Pulse (!) 45   Temp 98 F (36.7 C) (Oral)   Resp 14   Ht 5\' 2"  (1.575 m)   Wt 56.7 kg   LMP 10/22/2015   SpO2 98%   BMI 22.86 kg/m   Physical Exam  Procedures  Procedures  ED Course / MDM   Clinical Course as of 09/25/23 0516  Sun Sep 25, 2023  0022 I personally viewed the images from radiology studies and agree with radiologist interpretation: CT consistent with acute cholecystitis. Patient reports pain is improved. Will begin Abx and discuss with General Surgery [CS]  0027 Spoke with Dr. Michelle Nasuti, General Surgery, who requests admission to Hospitalist for pain control and [place] in the AM. She will consult.  [CS]  0102 Spoke with Dr. Thomes Dinning, Hospitalist, who will evaluate for admission.  [CS]    Clinical Course User Index [CS] Pollyann Savoy, MD   Medical Decision Making Problems Addressed: Acute cholecystitis: acute illness or injury  Amount and/or Complexity of Data Reviewed Labs: ordered. Radiology: independent interpretation performed. Decision-making details documented in ED Course.  Risk OTC drugs. Prescription drug management. Parenteral controlled substances. Decision regarding hospitalization.          Pollyann Savoy, MD 09/25/23 [phone]

## 2023-09-25 NOTE — Progress Notes (Signed)
Pt c/o 5/10 dull epigastric pain. Dilaudid was administered. No vomiting since arriving to 300. Pt to have [place] abd this AM. Patient aware of NPO status. No acute events overnight. Kellogg RN

## 2023-09-25 NOTE — Hospital Course (Signed)
57 year old female with history of coronary disease status post CABG times 3 in 2009, hypertension, hyperlipidemia, COPD, diastolic CHF presenting with epigastric and chest pain that started wile she was at work on 09/24/2023.  The patient states that her epigastric pain wrapping to the right and into her back.  She has some and vomiting without any blood.  She has never had this pain before.  She describes the pain feeling like a muscle cramp.  she denies any fevers, chills, shortness breath, coughing, hemoptysis.  She denies any anginal type symptoms prior to admission.  She denies any diarrhea, hematochezia, melena.  There is no dysuria or hematuria. In the ED, the patient was afebrile hemodynamically stable with oxygen saturation 100% room air.  WBC 5.7, hemoglobin 13.1, platelets 318.  Sodium 134, potassium 3.6, bicarbonate 21, serum creatinine 0.71.  LFTs unremarkable.  CT of the abdomen and pelvis showed multiple bladder wall thickening with edema.  There was no ductal dilatation.  General surgery was consulted.  Right upper quadrant ultrasound was ordered.

## 2023-09-25 NOTE — ED Notes (Signed)
ED Provider at bedside. 

## 2023-09-26 ENCOUNTER — Encounter (HOSPITAL_COMMUNITY): Payer: Self-pay | Admitting: Internal Medicine

## 2023-09-26 ENCOUNTER — Inpatient Hospital Stay (HOSPITAL_COMMUNITY): Payer: BC Managed Care – PPO | Admitting: Anesthesiology

## 2023-09-26 ENCOUNTER — Encounter (HOSPITAL_COMMUNITY)
Admission: EM | Disposition: A | Discharge status: Home / Self Care | Payer: Self-pay | Source: Home / Self Care | Attending: Emergency Medicine

## 2023-09-26 ENCOUNTER — Other Ambulatory Visit: Payer: Self-pay

## 2023-09-26 DIAGNOSIS — I10 Essential (primary) hypertension: Secondary | ICD-10-CM | POA: Diagnosis not present

## 2023-09-26 DIAGNOSIS — J438 Other emphysema: Secondary | ICD-10-CM

## 2023-09-26 DIAGNOSIS — J449 Chronic obstructive pulmonary disease, unspecified: Secondary | ICD-10-CM | POA: Diagnosis not present

## 2023-09-26 DIAGNOSIS — Z87891 Personal history of nicotine dependence: Secondary | ICD-10-CM | POA: Diagnosis not present

## 2023-09-26 DIAGNOSIS — K810 Acute cholecystitis: Secondary | ICD-10-CM | POA: Diagnosis not present

## 2023-09-26 DIAGNOSIS — E782 Mixed hyperlipidemia: Secondary | ICD-10-CM | POA: Diagnosis not present

## 2023-09-26 DIAGNOSIS — K8010 Calculus of gallbladder with chronic cholecystitis without obstruction: Secondary | ICD-10-CM | POA: Diagnosis not present

## 2023-09-26 DIAGNOSIS — G2581 Restless legs syndrome: Secondary | ICD-10-CM | POA: Diagnosis not present

## 2023-09-26 SURGERY — CHOLECYSTECTOMY, ROBOT-ASSISTED, LAPAROSCOPIC
Anesthesia: General | Site: Abdomen

## 2023-09-26 MED ORDER — SCOPOLAMINE 1 MG/3DAYS TD PT72
MEDICATED_PATCH | TRANSDERMAL | Status: AC
  Administered 2023-09-26: 1.5 mg via TRANSDERMAL
  Filled 2023-09-26: qty 1

## 2023-09-26 MED ORDER — LIDOCAINE HCL (CARDIAC) PF 100 MG/5ML IV SOSY
PREFILLED_SYRINGE | INTRAVENOUS | Status: DC | PRN
  Administered 2023-09-26: 60 mg via INTRAVENOUS

## 2023-09-26 MED ORDER — FENTANYL CITRATE (PF) 100 MCG/2ML IJ SOLN
INTRAMUSCULAR | Status: AC
  Filled 2023-09-26: qty 2

## 2023-09-26 MED ORDER — CHLORHEXIDINE GLUCONATE 0.12 % MT SOLN
OROMUCOSAL | Status: AC
  Administered 2023-09-26: 15 mL via OROMUCOSAL
  Filled 2023-09-26: qty 15

## 2023-09-26 MED ORDER — HYDROMORPHONE HCL 1 MG/ML IJ SOLN
INTRAMUSCULAR | Status: DC | PRN
  Administered 2023-09-26: .5 mg via INTRAVENOUS

## 2023-09-26 MED ORDER — OXYCODONE HCL 5 MG PO TABS
5.0000 mg | ORAL_TABLET | Freq: Four times a day (QID) | ORAL | Status: DC | PRN
  Administered 2023-09-26 – 2023-09-27 (×3): 5 mg via ORAL
  Filled 2023-09-26 (×3): qty 1

## 2023-09-26 MED ORDER — MIDAZOLAM HCL 2 MG/2ML IJ SOLN
INTRAMUSCULAR | Status: AC
  Filled 2023-09-26: qty 2

## 2023-09-26 MED ORDER — FENTANYL CITRATE (PF) 100 MCG/2ML IJ SOLN
INTRAMUSCULAR | Status: DC | PRN
  Administered 2023-09-26 (×4): 50 ug via INTRAVENOUS

## 2023-09-26 MED ORDER — STERILE WATER FOR IRRIGATION IR SOLN
Status: DC | PRN
  Administered 2023-09-26: 500 mL

## 2023-09-26 MED ORDER — SCOPOLAMINE 1 MG/3DAYS TD PT72: 1.0000 | MEDICATED_PATCH | Freq: Once | TRANSDERMAL | Status: DC

## 2023-09-26 MED ORDER — HYDROMORPHONE HCL 1 MG/ML IJ SOLN
INTRAMUSCULAR | Status: AC
  Filled 2023-09-26: qty 1

## 2023-09-26 MED ORDER — GLYCOPYRROLATE PF 0.2 MG/ML IJ SOSY
PREFILLED_SYRINGE | INTRAMUSCULAR | Status: DC | PRN
  Administered 2023-09-26: .2 mg via INTRAVENOUS

## 2023-09-26 MED ORDER — OXYCODONE HCL 5 MG PO TABS: 5.0000 mg | ORAL_TABLET | Freq: Four times a day (QID) | ORAL | 0 refills | Status: DC | PRN

## 2023-09-26 MED ORDER — LACTATED RINGERS IV SOLN: INTRAVENOUS | Status: DC | PRN

## 2023-09-26 MED ORDER — ONDANSETRON HCL 4 MG/2ML IJ SOLN
INTRAMUSCULAR | Status: DC | PRN
  Administered 2023-09-26: 4 mg via INTRAVENOUS

## 2023-09-26 MED ORDER — HYDROMORPHONE HCL 1 MG/ML IJ SOLN
0.2500 mg | INTRAMUSCULAR | Status: DC | PRN
Start: 2023-09-26 — End: 2023-09-26
  Administered 2023-09-26: 0.5 mg via INTRAVENOUS
  Filled 2023-09-26: qty 0.5

## 2023-09-26 MED ORDER — CHLORHEXIDINE GLUCONATE 0.12 % MT SOLN: 15.0000 mL | Freq: Once | OROMUCOSAL | Status: AC

## 2023-09-26 MED ORDER — GLYCOPYRROLATE PF 0.2 MG/ML IJ SOSY
PREFILLED_SYRINGE | INTRAMUSCULAR | Status: AC
  Filled 2023-09-26: qty 1

## 2023-09-26 MED ORDER — DOCUSATE SODIUM 100 MG PO CAPS: 100.0000 mg | ORAL_CAPSULE | Freq: Two times a day (BID) | ORAL | 2 refills | Status: DC

## 2023-09-26 MED ORDER — PROPOFOL 10 MG/ML IV BOLUS
INTRAVENOUS | Status: DC | PRN
  Administered 2023-09-26: 200 mg via INTRAVENOUS

## 2023-09-26 MED ORDER — BUPIVACAINE HCL (PF) 0.5 % IJ SOLN
INTRAMUSCULAR | Status: AC
Start: 2023-09-26 — End: ?
  Filled 2023-09-26: qty 30

## 2023-09-26 MED ORDER — ORAL CARE MOUTH RINSE: 15.0000 mL | Freq: Once | OROMUCOSAL | Status: AC

## 2023-09-26 MED ORDER — ACETAMINOPHEN 500 MG PO TABS
1000.0000 mg | ORAL_TABLET | Freq: Four times a day (QID) | ORAL | Status: DC
  Administered 2023-09-26 – 2023-09-27 (×3): 1000 mg via ORAL
  Filled 2023-09-26 (×3): qty 2

## 2023-09-26 MED ORDER — ONDANSETRON HCL 4 MG PO TABS: 4.0000 mg | ORAL_TABLET | Freq: Every day | ORAL | 1 refills | Status: AC | PRN

## 2023-09-26 MED ORDER — ACETAMINOPHEN 500 MG PO TABS: 1000.0000 mg | ORAL_TABLET | Freq: Four times a day (QID) | ORAL | 0 refills | Status: AC

## 2023-09-26 MED ORDER — ONDANSETRON HCL 4 MG/2ML IJ SOLN: 4.0000 mg | Freq: Once | INTRAMUSCULAR | Status: DC | PRN

## 2023-09-26 MED ORDER — OXYCODONE HCL 5 MG PO TABS: 5.0000 mg | ORAL_TABLET | Freq: Once | ORAL | Status: DC | PRN

## 2023-09-26 MED ORDER — OXYCODONE HCL 5 MG/5ML PO SOLN: 5.0000 mg | Freq: Once | ORAL | Status: DC | PRN

## 2023-09-26 MED ORDER — PROPOFOL 10 MG/ML IV BOLUS
INTRAVENOUS | Status: AC
  Filled 2023-09-26: qty 20

## 2023-09-26 MED ORDER — PHENYLEPHRINE 80 MCG/ML (10ML) SYRINGE FOR IV PUSH (FOR BLOOD PRESSURE SUPPORT)
PREFILLED_SYRINGE | INTRAVENOUS | Status: AC
  Filled 2023-09-26: qty 10

## 2023-09-26 MED ORDER — CHLORHEXIDINE GLUCONATE CLOTH 2 % EX PADS
6.0000 | MEDICATED_PAD | Freq: Once | CUTANEOUS | Status: AC
  Administered 2023-09-26: 6 via TOPICAL

## 2023-09-26 MED ORDER — ROCURONIUM BROMIDE 10 MG/ML (PF) SYRINGE
PREFILLED_SYRINGE | INTRAVENOUS | Status: AC
Start: 2023-09-26 — End: ?
  Filled 2023-09-26: qty 10

## 2023-09-26 MED ORDER — ASPIRIN EC 81 MG PO TBEC: 81.0000 mg | DELAYED_RELEASE_TABLET | Freq: Every day | ORAL | 11 refills | Status: AC

## 2023-09-26 MED ORDER — BUPIVACAINE HCL (PF) 0.5 % IJ SOLN
INTRAMUSCULAR | Status: DC | PRN
  Administered 2023-09-26: 30 mL

## 2023-09-26 MED ORDER — PHENYLEPHRINE 80 MCG/ML (10ML) SYRINGE FOR IV PUSH (FOR BLOOD PRESSURE SUPPORT)
PREFILLED_SYRINGE | INTRAVENOUS | Status: AC
  Filled 2023-09-26: qty 20

## 2023-09-26 MED ORDER — DEXAMETHASONE SODIUM PHOSPHATE 10 MG/ML IJ SOLN
INTRAMUSCULAR | Status: AC
  Filled 2023-09-26: qty 1

## 2023-09-26 MED ORDER — CHLORHEXIDINE GLUCONATE CLOTH 2 % EX PADS
6.0000 | MEDICATED_PAD | Freq: Once | CUTANEOUS | Status: DC
Start: 2023-09-26 — End: 2023-09-26
  Administered 2023-09-26: 6 via TOPICAL

## 2023-09-26 MED ORDER — DEXAMETHASONE SODIUM PHOSPHATE 10 MG/ML IJ SOLN
INTRAMUSCULAR | Status: DC | PRN
Start: 2023-09-26 — End: 2023-09-26
  Administered 2023-09-26: 6 mg via INTRAVENOUS

## 2023-09-26 MED ORDER — ONDANSETRON HCL 4 MG/2ML IJ SOLN
INTRAMUSCULAR | Status: AC
  Filled 2023-09-26: qty 2

## 2023-09-26 MED ORDER — ROCURONIUM BROMIDE 10 MG/ML (PF) SYRINGE
PREFILLED_SYRINGE | INTRAVENOUS | Status: DC | PRN
  Administered 2023-09-26: 60 mg via INTRAVENOUS

## 2023-09-26 MED ORDER — SUGAMMADEX SODIUM 200 MG/2ML IV SOLN
INTRAVENOUS | Status: DC | PRN
Start: 2023-09-26 — End: 2023-09-26
  Administered 2023-09-26: 200 mg via INTRAVENOUS

## 2023-09-26 MED ORDER — LIDOCAINE HCL (PF) 2 % IJ SOLN
INTRAMUSCULAR | Status: AC
  Filled 2023-09-26: qty 5

## 2023-09-26 MED ORDER — MIDAZOLAM HCL 2 MG/2ML IJ SOLN
INTRAMUSCULAR | Status: DC | PRN
  Administered 2023-09-26: 2 mg via INTRAVENOUS

## 2023-09-26 SURGICAL SUPPLY — 39 items
BLADE SURG 15 STRL LF DISP TIS (BLADE) ×2 IMPLANT
CAUTERY HOOK MNPLR 1.6 DVNC XI (INSTRUMENTS) ×2 IMPLANT
CHLORAPREP W/TINT 26 (MISCELLANEOUS) ×2 IMPLANT
CLIP LIGATING HEM O LOK PURPLE (MISCELLANEOUS) ×2 IMPLANT
DEFOGGER SCOPE WARMER CLEARIFY (MISCELLANEOUS) ×2 IMPLANT
DERMABOND ADVANCED .7 DNX12 (GAUZE/BANDAGES/DRESSINGS) ×2 IMPLANT
DRAPE ARM DVNC X/XI (DISPOSABLE) ×8 IMPLANT
DRAPE COLUMN DVNC XI (DISPOSABLE) ×2 IMPLANT
ELECT REM PT RETURN 9FT ADLT (ELECTROSURGICAL) ×1
ELECTRODE REM PT RTRN 9FT ADLT (ELECTROSURGICAL) ×2 IMPLANT
FORCEPS BPLR R/ABLATION 8 DVNC (INSTRUMENTS) ×2 IMPLANT
FORCEPS PROGRASP DVNC XI (FORCEP) ×2 IMPLANT
GLOVE BIOGEL PI IND STRL 6.5 (GLOVE) ×4 IMPLANT
GLOVE BIOGEL PI IND STRL 7.0 (GLOVE) ×6 IMPLANT
GLOVE SURG SS PI 6.5 STRL IVOR (GLOVE) ×4 IMPLANT
GOWN STRL REUS W/TWL LRG LVL3 (GOWN DISPOSABLE) ×6 IMPLANT
GRASPER SUT TROCAR 14GX15 (MISCELLANEOUS) ×2 IMPLANT
KIT TURNOVER KIT A (KITS) ×2 IMPLANT
MANIFOLD NEPTUNE II (INSTRUMENTS) ×2 IMPLANT
NDL HYPO 21X1.5 SAFETY (NEEDLE) ×2 IMPLANT
NDL INSUFFLATION 14GA 120MM (NEEDLE) ×2 IMPLANT
NEEDLE HYPO 21X1.5 SAFETY (NEEDLE) ×1 IMPLANT
NEEDLE INSUFFLATION 14GA 120MM (NEEDLE) ×1 IMPLANT
OBTURATOR OPTICAL STND 8 DVNC (TROCAR) ×1
OBTURATOR OPTICALSTD 8 DVNC (TROCAR) ×2 IMPLANT
PACK LAP CHOLE LZT030E (CUSTOM PROCEDURE TRAY) ×2 IMPLANT
PAD ARMBOARD 7.5X6 YLW CONV (MISCELLANEOUS) ×2 IMPLANT
PENCIL HANDSWITCHING (ELECTRODE) IMPLANT
POSITIONER HEAD 8X9X4 ADT (SOFTGOODS) ×2 IMPLANT
SEAL UNIV 5-12 XI (MISCELLANEOUS) ×6 IMPLANT
SET BASIN LINEN APH (SET/KITS/TRAYS/PACK) ×2 IMPLANT
SET TUBE DA VINCI INSUFFLATOR (TUBING) IMPLANT
SUT MNCRL AB 4-0 PS2 18 (SUTURE) ×4 IMPLANT
SUT VICRYL 0 AB UR-6 (SUTURE) IMPLANT
SYR 30ML LL (SYRINGE) ×2 IMPLANT
SYS RETRIEVAL 5MM INZII UNIV (BASKET) ×1
SYSTEM RETRIEVL 5MM INZII UNIV (BASKET) IMPLANT
WATER STERILE IRR 500ML POUR (IV SOLUTION) ×2 IMPLANT
YANKAUER SUCT BULB TIP 10FT TU (MISCELLANEOUS) IMPLANT

## 2023-09-26 NOTE — Transfer of Care (Signed)
Immediate Anesthesia Transfer of Care Note  Patient: [name] Wearing  Procedure(s) Performed: XI ROBOTIC ASSISTED LAPAROSCOPIC CHOLECYSTECTOMY (Abdomen)  Patient Location: PACU  Anesthesia Type:General  Level of Consciousness: drowsy and patient cooperative  Airway & Oxygen Therapy: Patient Spontanous Breathing and Patient connected to nasal cannula oxygen  Post-op Assessment: Report given to RN and Post -op Vital signs reviewed and stable  Post vital signs: Reviewed and stable  Last Vitals:  Vitals Value Taken Time  BP 166/62 09/26/23 1606  Temp 97.7 09/26/23   1606  Pulse 58 09/26/23 1609  Resp 13 09/26/23 1609  SpO2 100 % 09/26/23 1609  Vitals shown include unfiled device data.  Last Pain:  Vitals:   09/26/23 1252  TempSrc: Oral  PainSc: 0-No pain         Complications: No notable events documented.

## 2023-09-26 NOTE — Plan of Care (Signed)
  Problem: Education: Goal: Knowledge of General Education information will improve Description Including pain rating scale, medication(s)/side effects and non-pharmacologic comfort measures Outcome: Progressing   Problem: Health Behavior/Discharge Planning: Goal: Ability to manage health-related needs will improve Outcome: Progressing   

## 2023-09-26 NOTE — Discharge Instructions (Signed)
Ambulatory Surgery Discharge Instructions  General Anesthesia or Sedation Do not drive or operate heavy machinery for 24 hours.  Do not consume alcohol, tranquilizers, sleeping medications, or any non-prescribed medications for 24 hours. Do not make important decisions or sign any important papers in the next 24 hours. You should have someone with you tonight at home.  Activity  You are advised to go directly home from the hospital.  Restrict your activities and rest for a day.  Resume light activity tomorrow. No heavy lifting over 10 lbs or strenuous exercise.  Fluids and Diet Regular diet  Medications  If you have not had a bowel movement in 24 hours, take 2 tablespoons over the counter Milk of mag.             You May resume your blood thinners tomorrow (Aspirin, coumadin, or other).  You are being discharged with prescriptions for Opioid/Narcotic Medications: There are some specific considerations for these medications that you should know. Opioid Meds have risks & benefits. Addiction to these meds is always a concern with prolonged use Take medication only as directed Do not drive while taking narcotic pain medication Do not crush tablets or capsules Do not use a different container than medication was dispensed in Lock the container of medication in a cool, dry place out of reach of children and pets. Opioid medication can cause addiction Do not share with anyone else (this is a felony) Do not store medications for future use. Dispose of them properly.     Disposal:  Find a [facility] household drug take back site near you.  If you can't get to a drug take back site, use the recipe below as a last resort to dispose of expired, unused or unwanted drugs. Disposal  (Do not dispose chemotherapy drugs this way, talk to your prescribing doctor instead.) Step 1: Mix drugs (do not crush) with dirt, kitty litter, or used coffee grounds and add a small amount of water to dissolve any  solid medications. Step 2: Seal drugs in plastic bag. Step 3: Place plastic bag in trash. Step 4: Take prescription container and scratch out personal information, then recycle or throw away.  Operative Site  You have a liquid bandage over your incisions, this will begin to flake off in about a week. Ok to shower tomorrow. Keep wound clean and dry. No baths or swimming. No lifting more than 10 pounds.  Contact Information: If you have questions or concerns, please call our office, [phone], Monday- Thursday 8AM-5PM and Friday 8AM-12Noon.  If it is after hours or on the weekend, please call [facility] Main Number, [phone], and ask to speak to the surgeon on call for Dr. Brittnee Gaetano at [place].   SPECIFIC COMPLICATIONS TO WATCH FOR: Inability to urinate Fever over 101? F by mouth Nausea and vomiting lasting longer than 24 hours. Pain not relieved by medication ordered Swelling around the operative site Increased redness, warmth, hardness, around operative area Numbness, tingling, or cold fingers or toes Blood -soaked dressing, (small amounts of oozing may be normal) Increasing and progressive drainage from surgical area or exam site  

## 2023-09-26 NOTE — Progress Notes (Signed)
[facility] Progress Note  Day of Surgery  Subjective: Patient seen and examined.  She is resting comfortably in bed.  She was able to tolerate her diet yesterday without nausea or vomiting.  She still has a small amount of upper abdominal pain.  She has no questions about surgery.  Objective: Vital signs in last 24 hours: Temp:  [97.6 F (36.4 C)] 97.6 F (36.4 C) (12/02 1252) Pulse Rate:  [48-100] 48 (12/02 1252) Resp:  [17-20] 17 (12/02 1252) BP: (95-104)/(44-64) 95/64 (12/02 1252) SpO2:  [96 %-99 %] 99 % (12/02 1252) Weight:  [54.8 kg] 54.8 kg (12/02 1252) Last BM Date : 09/24/23  Intake/Output from previous day: 12/01 0701 - 12/02 0700 In: 580 [P.O.:480; IV Piggyback:100] Out: -  Intake/Output this shift: No intake/output data recorded.  General appearance: alert, cooperative, and no distress GI: Abdomen soft, nondistended, no percussion tenderness, minimal epigastric/right upper quadrant tenderness to palpation; no rigidity, guarding, rebound tenderness  Lab Results:  Recent Labs    09/24/23 2045 09/25/23 0602  WBC 5.7 6.3  HGB 13.1 12.0  HCT 39.9 38.0  PLT 361 318   BMET Recent Labs    09/24/23 2045 09/25/23 0602  NA 134* 138  K 3.6 3.9  CL 102 102  CO2 21* 27  GLUCOSE 103* 101*  BUN 13 11  CREATININE 0.71 0.71  CALCIUM 9.4 9.3   PT/INR No results for input(s): "LABPROT", "INR" in the last 72 hours.  Studies/Results: US Abdomen Limited  Result Date: 09/25/2023 CLINICAL DATA:  Suggestion potential gallbladder wall thickening/edema by CT of the abdomen. EXAM: ULTRASOUND ABDOMEN LIMITED RIGHT UPPER QUADRANT COMPARISON:  CT of the abdomen on 09/24/2023 FINDINGS: Gallbladder: There are some tiny mobile gallstones in the gallbladder. Gallbladder wall is not overtly thickened measuring up to approximately 3 mm anteriorly. No sonographic Eulah Pont sign was elicited. No pericholecystic fluid is identified. Common bile duct: Diameter: 4 mm.  Liver: The liver demonstrates mildly increased echogenicity, likely reflecting steatosis. No overt cirrhotic contour abnormalities or focal lesions are identified. There is no evidence of intrahepatic biliary ductal dilatation. Portal vein is patent on color Doppler imaging with normal direction of blood flow towards the liver. Other: No ascites identified in the right upper quadrant. IMPRESSION: 1. Cholelithiasis with tiny gallstones present. No definitive evidence of acute cholecystitis by ultrasound. 2. Mildly increased echogenicity of the liver, likely reflecting steatosis. Electronically Signed   By: Irish Lack M.D.   On: 09/25/2023 12:21   CT ABDOMEN PELVIS W CONTRAST  Result Date: 09/25/2023 CLINICAL DATA:  Biliary obstruction suspected. Sudden onset abdominal and chest pain. EXAM: CT ABDOMEN AND PELVIS WITH CONTRAST TECHNIQUE: Multidetector CT imaging of the abdomen and pelvis was performed using the standard protocol following bolus administration of intravenous contrast. RADIATION DOSE REDUCTION: This exam was performed according to the departmental dose-optimization program which includes automated exposure control, adjustment of the mA and/or kV according to patient size and/or use of iterative reconstruction technique. CONTRAST:  OMNIPAQUE IOHEXOL 300 MG/ML  SOLN COMPARISON:  12/04/2009 FINDINGS: Lower chest: Lung bases are clear. Hepatobiliary: Diffuse fatty infiltration of the liver. No focal liver lesions. Gallbladder wall thickening and edema. This may indicate acute cholecystitis. No stones are identified. No bile duct dilatation. Pancreas: Unremarkable. No pancreatic ductal dilatation or surrounding inflammatory changes. Spleen: Normal in size without focal abnormality. Adrenals/Urinary Tract: Adrenal glands are unremarkable. Kidneys are normal, without renal calculi, focal lesion, or hydronephrosis. Bladder is unremarkable. Stomach/Bowel: Stomach is within normal limits.  Appendix  appears normal. No evidence of bowel wall thickening, distention, or inflammatory changes. Vascular/Lymphatic: Aortic atherosclerosis. No enlarged abdominal or pelvic lymph nodes. Reproductive: Uterus and bilateral adnexa are unremarkable. Other: No abdominal wall hernia or abnormality. No abdominopelvic ascites. Musculoskeletal: No acute or significant osseous findings. IMPRESSION: 1. Mild gallbladder wall thickening and edema. This may represent acute cholecystitis. No stones or bile duct dilatation identified. 2. No evidence of bowel obstruction or inflammation. 3. Diffuse fatty infiltration of the liver. 4. Aortic atherosclerosis. Electronically Signed   By: Burman Nieves M.D.   On: 09/25/2023 00:09   DG Chest 2 View  Result Date: 09/24/2023 CLINICAL DATA:  Chest pain EXAM: CHEST - 2 VIEW COMPARISON:  CXR 04/23/21. CT chest 06/18/2022 FINDINGS: The heart and mediastinal contours are unchanged. Atherosclerotic plaque. Hyperinflation of the lungs. Biapical pleural/pulmonary scarring. No focal consolidation. No pulmonary edema. No pleural effusion. No pneumothorax. No acute osseous abnormality. Sternotomy wires intact. IMPRESSION: 1. No active cardiopulmonary disease. 2.  Aortic Atherosclerosis (ICD10-I70.0). Electronically Signed   By: Tish Frederickson M.D.   On: 09/24/2023 21:44    Anti-infectives: Anti-infectives (From admission, onward)    Start     Dose/Rate Route Frequency Ordered Stop   09/25/23 1000  [MAR Hold]  cefTRIAXone (ROCEPHIN) 2 g in sodium chloride 0.9 % 100 mL IVPB        (MAR Hold since Mon 09/26/2023 at 1239.Hold Reason: Transfer to a Procedural area)   2 g 200 mL/hr over 30 Minutes Intravenous Every 24 hours 09/25/23 0456     09/25/23 1000  [MAR Hold]  metroNIDAZOLE (FLAGYL) IVPB 500 mg        (MAR Hold since Mon 09/26/2023 at 1239.Hold Reason: Transfer to a Procedural area)   500 mg 100 mL/hr over 60 Minutes Intravenous Every 12 hours 09/25/23 0456     09/25/23 0030   cefTRIAXone (ROCEPHIN) 2 g in sodium chloride 0.9 % 100 mL IVPB        2 g 200 mL/hr over 30 Minutes Intravenous  Once 09/25/23 0021 09/25/23 0132       Assessment/Plan:  Patient is a 57 year old female who was admitted with concern for acute cholecystitis.  I suspect she has symptomatic cholelithiasis.  -Plan for robotic assisted laparoscopic cholecystectomy today -Abdominal ultrasound yesterday demonstrated distended gallbladder with stones without evidence of acute cholecystitis -NPO -IV fluids per primary team -PRN pain control and antiemetics -Continue IV Rocephin and Flagyl for now -Further recommendations to follow surgery   LOS: 1 day    Ross Hefferan A Arvis Miguez 09/26/2023

## 2023-09-26 NOTE — Plan of Care (Signed)
  Problem: Education: Goal: Knowledge of General Education information will improve Description Including pain rating scale, medication(s)/side effects and non-pharmacologic comfort measures Outcome: Progressing   

## 2023-09-26 NOTE — Progress Notes (Signed)
   09/26/23 1004  TOC Brief Assessment  Insurance and Status Reviewed  Patient has primary care physician Yes  Home environment has been reviewed from home  Prior level of function: independent  Prior/Current Home Services No current home services  Social Determinants of Health Reivew SDOH reviewed no interventions necessary  Readmission risk has been reviewed Yes  Transition of care needs no transition of care needs at this time    Transition of Care Department [facility]) has reviewed patient and no TOC needs have been identified at this time. We will continue to monitor patient advancement through interdisciplinary progression rounds. If new patient transition needs arise, please place a TOC consult.

## 2023-09-26 NOTE — Plan of Care (Signed)
  Problem: Education: Goal: Knowledge of General Education information will improve Description: Including pain rating scale, medication(s)/side effects and non-pharmacologic comfort measures 09/26/2023 0724 by Candace Gallus, RN Outcome: Progressing 09/26/2023 0724 by Candace Gallus, RN Outcome: Progressing

## 2023-09-26 NOTE — Anesthesia Preprocedure Evaluation (Addendum)
Anesthesia Evaluation  Patient identified by MRN, date of birth, ID band Patient awake    Reviewed: Allergy & Precautions, H&P , NPO status , Patient's Chart, lab work & pertinent test results, reviewed documented beta blocker date and time   Airway Mallampati: II  TM Distance: >3 FB Neck ROM: full    Dental no notable dental hx. (+) Dental Advisory Given, Teeth Intact   Pulmonary sleep apnea and Continuous Positive Airway Pressure Ventilation , COPD, former smoker emphysema   Pulmonary exam normal breath sounds clear to auscultation       Cardiovascular Exercise Tolerance: Good hypertension, + angina  + CAD, + Past MI and + DOE  Normal cardiovascular exam Rhythm:regular Rate:Normal     Neuro/Psych  PSYCHIATRIC DISORDERS Anxiety Depression Bipolar Disorder    Neuromuscular disease CVA    GI/Hepatic Neg liver ROS,GERD  ,,  Endo/Other  negative endocrine ROS    Renal/GU negative Renal ROS  negative genitourinary   Musculoskeletal  (+)  Fibromyalgia -  Abdominal   Peds  Hematology negative hematology ROS (+)   Anesthesia Other Findings   Reproductive/Obstetrics negative OB ROS                             Anesthesia Physical Anesthesia Plan  ASA: 3  Anesthesia Plan: General   Post-op Pain Management: Dilaudid IV   Induction: Intravenous  PONV Risk Score and Plan: 3 and Ondansetron, Dexamethasone, Midazolam and Scopolamine patch - Pre-op  Airway Management Planned: Oral ETT  Additional Equipment: None  Intra-op Plan:   Post-operative Plan: Extubation in OR  Informed Consent: I have reviewed the patients History and Physical, chart, labs and discussed the procedure including the risks, benefits and alternatives for the proposed anesthesia with the patient or authorized representative who has indicated his/her understanding and acceptance.     Dental Advisory Given  Plan  Discussed with: CRNA  Anesthesia Plan Comments:         Anesthesia Quick Evaluation

## 2023-09-26 NOTE — Progress Notes (Signed)
[facility]  Spoke with the patient's husband in the consultation room.  I explained that she tolerated the procedure without difficulty.  She has dissolvable stitches under the skin with overlying skin glue.  This will flake off in 10 to 14 days.  She will be discharged home with a prescription for narcotic pain medication that they should take as needed for pain.  I also want her taking scheduled Tylenol.  If they take the narcotic pain medication, they should take a stool softener as well.  The patient will follow-up with me in 2 weeks.  All questions were answered to his expressed satisfaction.  Plan: -Patient to return to her bed on the floor -Regular diet -PRN pain control and antiemetics -May discontinue antibiotics -If patient is able to tolerate a diet without nausea and vomiting, and her pain is controlled with oral medications, she is stable for discharge home from a surgical standpoint.  Likely plan for discharge later today versus tomorrow  Theophilus Kinds, DO [facility] [address] Vella Raring [place], [place] [address] [phone] (office)

## 2023-09-26 NOTE — Progress Notes (Signed)
PROGRESS NOTE  [name] VOZ:[id number] DOB: [birth year] DOA: 09/24/2023 PCP: Billie Lade, MD  Brief History:  57 year old female with history of coronary disease status post CABG times 3 in 2009, hypertension, hyperlipidemia, COPD, diastolic CHF presenting with epigastric and chest pain that started wile she was at work on 09/24/2023.  The patient states that her epigastric pain wrapping to the right and into her back.  She has some and vomiting without any blood.  She has never had this pain before.  She describes the pain feeling like a muscle cramp.  she denies any fevers, chills, shortness breath, coughing, hemoptysis.  She denies any anginal type symptoms prior to admission.  She denies any diarrhea, hematochezia, melena.  There is no dysuria or hematuria. In the ED, the patient was afebrile hemodynamically stable with oxygen saturation 100% room air.  WBC 5.7, hemoglobin 13.1, platelets 318.  Sodium 134, potassium 3.6, bicarbonate 21, serum creatinine 0.71.  LFTs unremarkable.  CT of the abdomen and pelvis showed multiple bladder wall thickening with edema.  There was no ductal dilatation.  General surgery was consulted.  Right upper quadrant ultrasound was ordered.   Assessment/Plan: Cholecystitis -Concerned about acute cholecystitis -Continue empiric ceftriaxone and metronidazole -Appreciate general surgery consult -Plan for laparoscopic cholecystectomy 09/26/23 -RUQ US--Cholelithiasis with tiny gallstones present; hepatic steatosis -Discussed with Dr. Robyne Peers   Coronary disease -cp not consistent with angina/ACS -troponin 4>>4 -Continue aspirin -Personally reviewed EKG--sinus rhythm, nonspecific T wave change   Chronic diastolic heart failure -Clinically euvolemic -Temporarily holding furosemide -Stable on room air -04/24/2021 echo EF 65-70%, no WMA, normal RVF, trivial MR   COPD -Stable on room air -Patient continues to vape -Continue Dulera   Mixed  hyperlipidemia -Patient is on Repatha   Essential hypertension -hold losartan temporarily   Generalized anxiety -Continue fluoxetine /alprazolam           Family Communication:  spouse 12/1   Consultants:  general surgery   Code Status:  FULL   DVT Prophylaxis:  SCDs     Procedures: As Listed in Progress Note Above   Antibiotics: Ceftriaxone 12/1>> Flagyl 12/1>>              Subjective:  Pt states abd pain is controlled.  Denies f/c, cp, sob, n/v/d Objective: Vitals:   09/25/23 1402 09/25/23 1951 09/25/23 1957 09/26/23 0717  BP: (!) 100/44  (!) 104/48   Pulse: 100  (!) 58   Resp: 20     Temp: 97.6 F (36.4 C)     TempSrc: Oral     SpO2: 98% 97% 96% 97%  Weight:      Height:        Intake/Output Summary (Last 24 hours) at 09/26/2023 1040 Last data filed at 09/25/2023 2127 Gross per 24 hour  Intake 340 ml  Output --  Net 340 ml   Weight change:  Exam:  General:  Pt is alert, follows commands appropriately, not in acute distress HEENT: No icterus, No thrush, No neck mass, [place]/AT Cardiovascular: RRR, S1/S2, no rubs, no gallops Respiratory: CTA bilaterally, no wheezing, no crackles, no rhonchi Abdomen: Soft/+BS, non tender, non distended, no guarding Extremities: No edema, No lymphangitis, No petechiae, No rashes, no synovitis   Data Reviewed: I have personally reviewed following labs and imaging studies Basic Metabolic Panel: Recent Labs  Lab 09/24/23 2045 09/25/23 0602  NA 134* 138  K 3.6 3.9  CL 102 102  CO2  21* 27  GLUCOSE 103* 101*  BUN 13 11  CREATININE 0.71 0.71  CALCIUM 9.4 9.3  MG 1.8 1.9  PHOS  --  4.3   Liver Function Tests: Recent Labs  Lab 09/24/23 2045 09/25/23 0602  AST 19 17  ALT 14 12  ALKPHOS 88 76  BILITOT 0.3 0.4  PROT 7.7 6.8  ALBUMIN 4.1 3.6   Recent Labs  Lab 09/24/23 2045  LIPASE 51   No results for input(s): "AMMONIA" in the last 168 hours. Coagulation Profile: No results for input(s):  "INR", "PROTIME" in the last 168 hours. CBC: Recent Labs  Lab 09/24/23 2045 09/25/23 0602  WBC 5.7 6.3  HGB 13.1 12.0  HCT 39.9 38.0  MCV 89.5 91.6  PLT 361 318   Cardiac Enzymes: No results for input(s): "CKTOTAL", "CKMB", "CKMBINDEX", "TROPONINI" in the last 168 hours. BNP: Invalid input(s): "POCBNP" CBG: No results for input(s): "GLUCAP" in the last 168 hours. HbA1C: No results for input(s): "HGBA1C" in the last 72 hours. Urine analysis:    Component Value Date/Time   COLORURINE YELLOW 12/03/2009 2300   APPEARANCEUR CLEAR 12/03/2009 2300   LABSPEC 1.024 12/03/2009 2300   PHURINE 6.0 12/03/2009 2300   GLUCOSEU NEGATIVE 12/03/2009 2300   HGBUR NEGATIVE 12/03/2009 2300   BILIRUBINUR NEGATIVE 12/03/2009 2300   KETONESUR 15 (A) 12/03/2009 2300   PROTEINUR NEGATIVE 12/03/2009 2300   UROBILINOGEN 1.0 12/03/2009 2300   NITRITE NEGATIVE 12/03/2009 2300   LEUKOCYTESUR  12/03/2009 2300    NEGATIVE MICROSCOPIC NOT DONE ON URINES WITH NEGATIVE PROTEIN, BLOOD, LEUKOCYTES, NITRITE, OR GLUCOSE <1000 mg/dL.   Sepsis Labs: @LABRCNTIP (procalcitonin:4,lacticidven:4) )No results found for this or any previous visit (from the past 240 hour(s)).   Scheduled Meds:  aspirin EC  81 mg Oral Daily   FLUoxetine  40 mg Oral Daily   mometasone-formoterol  2 puff Inhalation BID   pneumococcal 20-valent conjugate vaccine  0.5 mL Intramuscular Tomorrow-1000   Continuous Infusions:  cefTRIAXone (ROCEPHIN)  IV 2 g (09/26/23 2956)   metronidazole 500 mg (09/26/23 1000)    Procedures/Studies: US Abdomen Limited  Result Date: 09/25/2023 CLINICAL DATA:  Suggestion potential gallbladder wall thickening/edema by CT of the abdomen. EXAM: ULTRASOUND ABDOMEN LIMITED RIGHT UPPER QUADRANT COMPARISON:  CT of the abdomen on 09/24/2023 FINDINGS: Gallbladder: There are some tiny mobile gallstones in the gallbladder. Gallbladder wall is not overtly thickened measuring up to approximately 3 mm anteriorly. No  sonographic Eulah Pont sign was elicited. No pericholecystic fluid is identified. Common bile duct: Diameter: 4 mm. Liver: The liver demonstrates mildly increased echogenicity, likely reflecting steatosis. No overt cirrhotic contour abnormalities or focal lesions are identified. There is no evidence of intrahepatic biliary ductal dilatation. Portal vein is patent on color Doppler imaging with normal direction of blood flow towards the liver. Other: No ascites identified in the right upper quadrant. IMPRESSION: 1. Cholelithiasis with tiny gallstones present. No definitive evidence of acute cholecystitis by ultrasound. 2. Mildly increased echogenicity of the liver, likely reflecting steatosis. Electronically Signed   By: Irish Lack M.D.   On: 09/25/2023 12:21   CT ABDOMEN PELVIS W CONTRAST  Result Date: 09/25/2023 CLINICAL DATA:  Biliary obstruction suspected. Sudden onset abdominal and chest pain. EXAM: CT ABDOMEN AND PELVIS WITH CONTRAST TECHNIQUE: Multidetector CT imaging of the abdomen and pelvis was performed using the standard protocol following bolus administration of intravenous contrast. RADIATION DOSE REDUCTION: This exam was performed according to the departmental dose-optimization program which includes automated exposure control, adjustment of the mA  and/or kV according to patient size and/or use of iterative reconstruction technique. CONTRAST:  OMNIPAQUE IOHEXOL 300 MG/ML  SOLN COMPARISON:  12/04/2009 FINDINGS: Lower chest: Lung bases are clear. Hepatobiliary: Diffuse fatty infiltration of the liver. No focal liver lesions. Gallbladder wall thickening and edema. This may indicate acute cholecystitis. No stones are identified. No bile duct dilatation. Pancreas: Unremarkable. No pancreatic ductal dilatation or surrounding inflammatory changes. Spleen: Normal in size without focal abnormality. Adrenals/Urinary Tract: Adrenal glands are unremarkable. Kidneys are normal, without renal calculi,  focal lesion, or hydronephrosis. Bladder is unremarkable. Stomach/Bowel: Stomach is within normal limits. Appendix appears normal. No evidence of bowel wall thickening, distention, or inflammatory changes. Vascular/Lymphatic: Aortic atherosclerosis. No enlarged abdominal or pelvic lymph nodes. Reproductive: Uterus and bilateral adnexa are unremarkable. Other: No abdominal wall hernia or abnormality. No abdominopelvic ascites. Musculoskeletal: No acute or significant osseous findings. IMPRESSION: 1. Mild gallbladder wall thickening and edema. This may represent acute cholecystitis. No stones or bile duct dilatation identified. 2. No evidence of bowel obstruction or inflammation. 3. Diffuse fatty infiltration of the liver. 4. Aortic atherosclerosis. Electronically Signed   By: Burman Nieves M.D.   On: 09/25/2023 00:09   DG Chest 2 View  Result Date: 09/24/2023 CLINICAL DATA:  Chest pain EXAM: CHEST - 2 VIEW COMPARISON:  CXR 04/23/21. CT chest 06/18/2022 FINDINGS: The heart and mediastinal contours are unchanged. Atherosclerotic plaque. Hyperinflation of the lungs. Biapical pleural/pulmonary scarring. No focal consolidation. No pulmonary edema. No pleural effusion. No pneumothorax. No acute osseous abnormality. Sternotomy wires intact. IMPRESSION: 1. No active cardiopulmonary disease. 2.  Aortic Atherosclerosis (ICD10-I70.0). Electronically Signed   By: Tish Frederickson M.D.   On: 09/24/2023 21:44    Catarina Hartshorn, DO  [facility]  If 7PM-7AM, please contact night-coverage [email or web address] Password [email or web address] 09/26/2023, 10:40 AM   LOS: 1 day

## 2023-09-26 NOTE — Anesthesia Postprocedure Evaluation (Signed)
Anesthesia Post Note  Patient: [name]  Procedure(s) Performed: XI ROBOTIC ASSISTED LAPAROSCOPIC CHOLECYSTECTOMY (Abdomen)  Patient location during evaluation: PACU Anesthesia Type: General Level of consciousness: awake and alert Pain management: pain level controlled Vital Signs Assessment: post-procedure vital signs reviewed and stable Respiratory status: spontaneous breathing, nonlabored ventilation, respiratory function stable and patient connected to nasal cannula oxygen Cardiovascular status: blood pressure returned to baseline and stable Postop Assessment: no apparent nausea or vomiting Anesthetic complications: no   There were no known notable events for this encounter.   Last Vitals:  Vitals:   09/26/23 1607 09/26/23 1615  BP: (!) 166/62 (!) 164/73  Pulse: (!) 58 (!) 52  Resp: 17 15  Temp: 36.5 C   SpO2: 100% 100%    Last Pain:  Vitals:   09/26/23 1607  TempSrc:   PainSc: 0-No pain                 Camdynn Maranto L [name]

## 2023-09-26 NOTE — Anesthesia Procedure Notes (Signed)
Procedure Name: Intubation Date/Time: 09/26/2023 2:32 PM  Performed by: Oletha Cruel, CRNAPre-anesthesia Checklist: Patient identified, Emergency Drugs available, Suction available and Patient being monitored Patient Re-evaluated:Patient Re-evaluated prior to induction Oxygen Delivery Method: Circle system utilized Preoxygenation: Pre-oxygenation with 100% oxygen Induction Type: IV induction Ventilation: Mask ventilation without difficulty Laryngoscope Size: Glidescope and 3 Grade View: Grade I Tube type: Oral Tube size: 7.0 mm Number of attempts: 1 Airway Equipment and Method: Video-laryngoscopy Placement Confirmation: ETT inserted through vocal cords under direct vision, positive ETCO2, CO2 detector and breath sounds checked- equal and bilateral Secured at: 22 cm Tube secured with: Tape Dental Injury: Teeth and Oropharynx as per pre-operative assessment  Comments: Direct laryngoscopy x 1. Grade 3 anterior view. Converted to Glidescope. Grade 1 view. Atraumatic insertion of oral endotracheal tube. Lips and teeth remain in preoperative condition.

## 2023-09-26 NOTE — Op Note (Signed)
[facility] Operative Note  09/26/23  Preoperative Diagnosis: Acute cholecystitis   Postoperative Diagnosis: Same   Procedure(s) Performed: Robotic Assisted Laparoscopic Cholecystectomy   Surgeon: Theophilus Kinds, DO   Assistants: No qualified resident was available    Anesthesia: General endotracheal   Anesthesiologist: Ronelle Nigh, MD    Specimens: Gallbladder   Estimated Blood Loss: Minimal   Blood Replacement: None    Complications: None   Wound Class: Contaminated   Operative Indications: The patient was admitted to the hospital with concerns for acute cholecystitis.  She was found to have findings concerning for acute cholecystitis on CT of the abdomen and pelvis on imaging and had epigastric/right upper quadrant abdominal pain that was uncontrolled with oral medications.  Decision was made to take the patient to the operating room during this admission for cholecystectomy.  We discussed the risk of the procedure including but not limited to bleeding, infection, injury to the common bile duct, bile leak, need for further procedures, chance of subtotal cholecystectomy.   Findings:  Mildly inflamed gallbladder, consistent with likely early acute cholecystitis Critical view of safety noted All clips intact at the end of the case Adequate hemostasis   Procedure: The patient was taken to the operating room and placed supine. General endotracheal anesthesia was induced.  Patient was receiving IV antibiotics on the floor.  An orogastric tube positioned to decompress the stomach. The abdomen was prepared and draped in the usual sterile fashion. A time-out was completed verifying correct patient, procedure, site, positioning, and implant(s) and/or special equipment prior to beginning this procedure.  Veress needle was placed at the infraumbilical area and insufflation was started after confirming a positive saline drop test and no immediate increase in  abdominal pressure.  After reaching 15 mm, the Veress needle was removed and a 8 mm port was placed via optiview technique infraumbilical, measuring 20 mm away from the suspected position of the gallbladder.  The abdomen was inspected and no abnormalities or injuries were found.  Under direct vision, ports were placed in the following locations in a semi curvilinear position around the target of the gallbladder: Two 8 mm ports on the patient's right each having 8cm clearance to the adjacent ports and one 8 mm port placed on the patient's left 8 cm from the umbilical port. Once ports were placed, the table was placed in the reverse Trendelenburg position with the right side up. The Xi platform was brought into the operative field and docked to the ports successfully.  An endoscope was placed through the umbilical port, prograsp through the most lateral right port, fenestrated bipolar to the port just right of the umbilicus, and then a hook cautery in the left port.   The dome of the gallbladder was grasped with prograsp and retracted over the dome of the liver. Adhesions between the gallbladder and omentum, duodenum and transverse colon were lysed via hook cautery. The infundibulum was grasped with the fenestrated grasper and retracted toward the right lower quadrant. This maneuver exposed Calot's triangle.  The peritoneum overlying the gallbladder infundibulum was then dissected and the cystic duct and cystic artery identified.  Critical view of safety with the liver bed clearly visible behind the duct and artery with no additional structures noted.  The cystic duct and cystic artery were doubly clipped and divided close to the gallbladder.    The gallbladder was then dissected from its peritoneal and liver bed attachments by electrocautery. Hemostasis was checked prior to removing the hook cautery.  The [employer] was undocked and moved out of the field.  A 5mm Endo Catch bag was then placed through the umbilical port  and the gallbladder was removed.  The gallbladder was passed off the table as a specimen. There was no evidence of bleeding from the gallbladder fossa or cystic artery or leakage of the bile from the cystic duct stump. The umbilical port site closed with a 0 vicryl with a PMI needle.  The abdomen was desufflated and secondary trocars were removed under direct vision. No bleeding was noted. Incisions were localized with marcaine.  All skin incisions were closed with subcuticular sutures of 4-0 monocryl and dermabond.   Final inspection revealed acceptable hemostasis. All counts were correct at the end of the case. The patient was awakened from anesthesia and extubated without complication. The OG tube was removed.  The patient went to the PACU in stable condition.   Theophilus Kinds, DO [facility] [address] [place], [place] [address] [phone] (office)

## 2023-09-27 ENCOUNTER — Encounter: Payer: Self-pay | Admitting: *Deleted

## 2023-09-27 DIAGNOSIS — K810 Acute cholecystitis: Secondary | ICD-10-CM | POA: Diagnosis not present

## 2023-09-27 DIAGNOSIS — E782 Mixed hyperlipidemia: Secondary | ICD-10-CM | POA: Diagnosis not present

## 2023-09-27 DIAGNOSIS — G2581 Restless legs syndrome: Secondary | ICD-10-CM | POA: Diagnosis not present

## 2023-09-27 NOTE — Progress Notes (Addendum)
Lap sites dry and intact with glue.  C/O pain rated an 8 and received scheduled tylenol and oxycodone 5 mg.  Not passing gas and no bm since 12/1.  Ambulating to restroom for voiding.  Encouraged to deep breath and cough, use incentive.  Does not sleep at night due to working 12 hour night shifts. Reported BM just now at 0700

## 2023-09-27 NOTE — Progress Notes (Signed)
[facility] Progress Note  1 Day Post-Op  Subjective: Patient seen and examined.  She is resting comfortably in bed.  She tolerated her diet without nausea and vomiting.  States that she is sore at her incision sites, but oral pain medications do improve the pain.  Objective: Vital signs in last 24 hours: Temp:  [97.6 F (36.4 C)-98.6 F (37 C)] 97.7 F (36.5 C) (12/03 0401) Pulse Rate:  [42-58] 51 (12/03 0401) Resp:  [13-18] 18 (12/03 0401) BP: (95-166)/(50-73) 106/54 (12/03 0401) SpO2:  [95 %-100 %] 98 % (12/03 0811) Weight:  [54.8 kg] 54.8 kg (12/02 1252) Last BM Date : 09/24/23  Intake/Output from previous day: 12/02 0701 - 12/03 0700 In: 2210 [P.O.:360; I.V.:1450; IV Piggyback:400] Out: 10 [Blood:10] Intake/Output this shift: No intake/output data recorded.  General appearance: alert, cooperative, and no distress GI: Abdomen soft, nondistended, no percussion tenderness, mild incisional tenderness to palpation; no rigidity, guarding, rebound tenderness; laparoscopic incision sites C/D/I with Dermabond in place  Lab Results:  Recent Labs    09/24/23 2045 09/25/23 0602  WBC 5.7 6.3  HGB 13.1 12.0  HCT 39.9 38.0  PLT 361 318   BMET Recent Labs    09/24/23 2045 09/25/23 0602  NA 134* 138  K 3.6 3.9  CL 102 102  CO2 21* 27  GLUCOSE 103* 101*  BUN 13 11  CREATININE 0.71 0.71  CALCIUM 9.4 9.3   PT/INR No results for input(s): "LABPROT", "INR" in the last 72 hours.  Studies/Results: US Abdomen Limited  Result Date: 09/25/2023 CLINICAL DATA:  Suggestion potential gallbladder wall thickening/edema by CT of the abdomen. EXAM: ULTRASOUND ABDOMEN LIMITED RIGHT UPPER QUADRANT COMPARISON:  CT of the abdomen on 09/24/2023 FINDINGS: Gallbladder: There are some tiny mobile gallstones in the gallbladder. Gallbladder wall is not overtly thickened measuring up to approximately 3 mm anteriorly. No sonographic Eulah Pont sign was elicited. No pericholecystic  fluid is identified. Common bile duct: Diameter: 4 mm. Liver: The liver demonstrates mildly increased echogenicity, likely reflecting steatosis. No overt cirrhotic contour abnormalities or focal lesions are identified. There is no evidence of intrahepatic biliary ductal dilatation. Portal vein is patent on color Doppler imaging with normal direction of blood flow towards the liver. Other: No ascites identified in the right upper quadrant. IMPRESSION: 1. Cholelithiasis with tiny gallstones present. No definitive evidence of acute cholecystitis by ultrasound. 2. Mildly increased echogenicity of the liver, likely reflecting steatosis. Electronically Signed   By: Irish Lack M.D.   On: 09/25/2023 12:21    Anti-infectives: Anti-infectives (From admission, onward)    Start     Dose/Rate Route Frequency Ordered Stop   09/25/23 1000  cefTRIAXone (ROCEPHIN) 2 g in sodium chloride 0.9 % 100 mL IVPB  Status:  Discontinued        2 g 200 mL/hr over 30 Minutes Intravenous Every 24 hours 09/25/23 0456 09/26/23 1821   09/25/23 1000  metroNIDAZOLE (FLAGYL) IVPB 500 mg  Status:  Discontinued        500 mg 100 mL/hr over 60 Minutes Intravenous Every 12 hours 09/25/23 0456 09/26/23 1821   09/25/23 0030  cefTRIAXone (ROCEPHIN) 2 g in sodium chloride 0.9 % 100 mL IVPB        2 g 200 mL/hr over 30 Minutes Intravenous  Once 09/25/23 0021 09/25/23 0132       Assessment/Plan:  Patient is a 57 year old female who was admitted with concern for acute cholecystitis.  She is status post robotic assisted laparoscopic cholecystectomy on  12/2.  -Patient tolerating a regular diet -PRN pain control and antiemetics -Patient stable for discharge home from a general surgery standpoint.  She will follow-up with me in 2 weeks.   LOS: 2 days    Shuntel Fishburn A Myrla Malanowski 09/27/2023

## 2023-09-27 NOTE — Discharge Summary (Signed)
Physician Discharge Summary   Patient: [name] MRN: [id number] DOB: 1965/11/13  Admit date:     09/24/2023  Discharge date: 09/27/23  Discharge Physician: Onalee Hua Kaeleb Emond   PCP: Billie Lade, MD   Recommendations at discharge:   Please follow up with primary care provider within 1-2 weeks  Please repeat BMP and CBC in one week     Hospital Course: 57 year old female with history of coronary disease status post CABG times 3 in 2009, hypertension, hyperlipidemia, COPD, diastolic CHF presenting with epigastric and chest pain that started wile she was at work on 09/24/2023.  The patient states that her epigastric pain wrapping to the right and into her back.  She has some and vomiting without any blood.  She has never had this pain before.  She describes the pain feeling like a muscle cramp.  she denies any fevers, chills, shortness breath, coughing, hemoptysis.  She denies any anginal type symptoms prior to admission.  She denies any diarrhea, hematochezia, melena.  There is no dysuria or hematuria. In the ED, the patient was afebrile hemodynamically stable with oxygen saturation 100% room air.  WBC 5.7, hemoglobin 13.1, platelets 318.  Sodium 134, potassium 3.6, bicarbonate 21, serum creatinine 0.71.  LFTs unremarkable.  CT of the abdomen and pelvis showed multiple bladder wall thickening with edema.  There was no ductal dilatation.  General surgery was consulted.  Right upper quadrant ultrasound was ordered.  Assessment and Plan: Acute Cholecystitis -Continue empiric ceftriaxone and metronidazole prior to surgery -Appreciate general surgery consult -Plan for laparoscopic cholecystectomy 09/26/23 -RUQ US--Cholelithiasis with tiny gallstones present; hepatic steatosis -12/2--Robotic Assisted Laparoscopic Cholecystectomy  -post-op pain controlled and tolerating diet prior to dc   Coronary disease -cp not consistent with angina/ACS -troponin 4>>4 -Continue aspirin -Personally  reviewed EKG--sinus rhythm, nonspecific T wave change   Chronic diastolic heart failure -Clinically euvolemic -Temporarily holding furosemide -Stable on room air -04/24/2021 echo EF 65-70%, no WMA, normal RVF, trivial MR   COPD -Stable on room air -Patient continues to vape -Continue Dulera   Mixed hyperlipidemia -Patient is on Repatha   Essential hypertension -hold losartan temporarily>>restart after dc   Generalized anxiety -Continue fluoxetine /alprazolam         Consultants: general surgery Procedures performed: none  Disposition: Home Diet recommendation:  Discharge Diet Orders (From admission, onward)     Start     Ordered   09/26/23 0000  Diet - low sodium heart healthy        09/26/23 1553           Cardiac diet DISCHARGE MEDICATION: Allergies as of 09/27/2023       Reactions   Isosorbide Nitrate Other (See Comments)   Headaches; Worsening Chest Pain        Medication List     STOP taking these medications    ibuprofen 800 MG tablet Commonly known as: ADVIL   potassium chloride 20 MEQ packet Commonly known as: KLOR-CON       TAKE these medications    acetaminophen 500 MG tablet Commonly known as: TYLENOL Take 2 tablets (1,000 mg total) by mouth every 6 (six) hours for 7 days.   albuterol 108 (90 Base) MCG/ACT inhaler Commonly known as: VENTOLIN HFA Inhale 2 puffs into the lungs every 6 (six) hours as needed for wheezing or shortness of breath.   ALPRAZolam 0.5 MG tablet Commonly known as: XANAX TAKE 1 TABLET BY MOUTH THREE TIMES DAILY AS NEEDED What changed: reasons to take  this   aspirin EC 81 MG tablet Take 1 tablet (81 mg total) by mouth daily. Start taking on: September 28, 2023   budesonide-formoterol 160-4.5 MCG/ACT inhaler Commonly known as: SYMBICORT 1-2 puffs every 12 hours What changed:  how much to take how to take this when to take this additional instructions   buPROPion 150 MG 24 hr tablet Commonly known  as: WELLBUTRIN XL TAKE 1 TABLET BY MOUTH DAILY   CENTRUM SILVER PO Take 1 tablet by mouth daily.   cyanocobalamin 100 MCG tablet Commonly known as: VITAMIN B12 Take 100 mcg by mouth daily.   cyclobenzaprine 10 MG tablet Commonly known as: FLEXERIL Take 10 mg by mouth 2 (two) times daily as needed for muscle spasms.   diphenoxylate-atropine 2.5-0.025 MG tablet Commonly known as: LOMOTIL Take 2.5 mg by mouth daily as needed for diarrhea or loose stools.   docusate sodium 100 MG capsule Commonly known as: Colace Take 1 capsule (100 mg total) by mouth 2 (two) times daily.   esomeprazole 40 MG capsule Commonly known as: NEXIUM TAKE ONE CAPSULE BY MOUTH DAILY   FLUoxetine 40 MG capsule Commonly known as: PROZAC Take 1 capsule (40 mg total) by mouth 2 (two) times daily.   fluticasone 50 MCG/ACT nasal spray Commonly known as: FLONASE Place 2 sprays into both nostrils daily.   furosemide 40 MG tablet Commonly known as: LASIX Take 1 tablet (40 mg total) by mouth daily.   losartan 25 MG tablet Commonly known as: COZAAR Take 1 tablet (25 mg total) by mouth daily.   nitroGLYCERIN 0.4 MG SL tablet Commonly known as: NITROSTAT Place 1 tablet (0.4 mg total) under the tongue every 5 (five) minutes x 3 doses as needed for chest pain (if no relief after 3rd dose, proceed to the ED for an evaluation).   ondansetron 4 MG tablet Commonly known as: Zofran Take 1 tablet (4 mg total) by mouth daily as needed for nausea or vomiting.   oxyCODONE 5 MG immediate release tablet Commonly known as: Roxicodone Take 1 tablet (5 mg total) by mouth every 6 (six) hours as needed.   Repatha SureClick 140 MG/ML Soaj Generic drug: Evolocumab Inject 140 mg into the skin every 14 (fourteen) days.   rOPINIRole 1 MG tablet Commonly known as: REQUIP TAKE 1 TABLET BY MOUTH AT BEDTIME AS NEEDED   VITAMIN D3 PO Take 1 tablet by mouth daily.   zolpidem 10 MG tablet Commonly known as: AMBIEN TAKE 1  TABLET BY MOUTH AT BEDTIME What changed:  when to take this reasons to take this        Follow-up Information     Pappayliou, Santina Evans A, DO. Call.   Specialty: General Surgery Why: Call to schedule a follow up appointment in 2 weeks Contact information: [id number] [place] [facility] [address] [phone]                Discharge Exam: Filed Weights   09/24/23 2043 09/25/23 0144 09/26/23 1252  Weight: 56.7 kg 54.9 kg 54.8 kg   HEENT:  [place]/AT, No thrush, no icterus CV:  RRR, no rub, no S3, no S4 Lung:  CTA, no wheeze, no rhonchi Abd:  soft/+BS, mild diffuse tenderness Ext:  No edema, no lymphangitis, no synovitis, no rash   Condition at discharge: stable  The results of significant diagnostics from this hospitalization (including imaging, microbiology, ancillary and laboratory) are listed below for reference.   Imaging Studies: US Abdomen Limited  Result Date: 09/25/2023 CLINICAL DATA:  Suggestion potential gallbladder wall thickening/edema by CT of the abdomen. EXAM: ULTRASOUND ABDOMEN LIMITED RIGHT UPPER QUADRANT COMPARISON:  CT of the abdomen on 09/24/2023 FINDINGS: Gallbladder: There are some tiny mobile gallstones in the gallbladder. Gallbladder wall is not overtly thickened measuring up to approximately 3 mm anteriorly. No sonographic Eulah Pont sign was elicited. No pericholecystic fluid is identified. Common bile duct: Diameter: 4 mm. Liver: The liver demonstrates mildly increased echogenicity, likely reflecting steatosis. No overt cirrhotic contour abnormalities or focal lesions are identified. There is no evidence of intrahepatic biliary ductal dilatation. Portal vein is patent on color Doppler imaging with normal direction of blood flow towards the liver. Other: No ascites identified in the right upper quadrant. IMPRESSION: 1. Cholelithiasis with tiny gallstones present. No definitive evidence of acute cholecystitis by ultrasound. 2. Mildly increased echogenicity  of the liver, likely reflecting steatosis. Electronically Signed   By: Irish Lack M.D.   On: 09/25/2023 12:21   CT ABDOMEN PELVIS W CONTRAST  Result Date: 09/25/2023 CLINICAL DATA:  Biliary obstruction suspected. Sudden onset abdominal and chest pain. EXAM: CT ABDOMEN AND PELVIS WITH CONTRAST TECHNIQUE: Multidetector CT imaging of the abdomen and pelvis was performed using the standard protocol following bolus administration of intravenous contrast. RADIATION DOSE REDUCTION: This exam was performed according to the departmental dose-optimization program which includes automated exposure control, adjustment of the mA and/or kV according to patient size and/or use of iterative reconstruction technique. CONTRAST:  OMNIPAQUE IOHEXOL 300 MG/ML  SOLN COMPARISON:  12/04/2009 FINDINGS: Lower chest: Lung bases are clear. Hepatobiliary: Diffuse fatty infiltration of the liver. No focal liver lesions. Gallbladder wall thickening and edema. This may indicate acute cholecystitis. No stones are identified. No bile duct dilatation. Pancreas: Unremarkable. No pancreatic ductal dilatation or surrounding inflammatory changes. Spleen: Normal in size without focal abnormality. Adrenals/Urinary Tract: Adrenal glands are unremarkable. Kidneys are normal, without renal calculi, focal lesion, or hydronephrosis. Bladder is unremarkable. Stomach/Bowel: Stomach is within normal limits. Appendix appears normal. No evidence of bowel wall thickening, distention, or inflammatory changes. Vascular/Lymphatic: Aortic atherosclerosis. No enlarged abdominal or pelvic lymph nodes. Reproductive: Uterus and bilateral adnexa are unremarkable. Other: No abdominal wall hernia or abnormality. No abdominopelvic ascites. Musculoskeletal: No acute or significant osseous findings. IMPRESSION: 1. Mild gallbladder wall thickening and edema. This may represent acute cholecystitis. No stones or bile duct dilatation identified. 2. No evidence of bowel  obstruction or inflammation. 3. Diffuse fatty infiltration of the liver. 4. Aortic atherosclerosis. Electronically Signed   By: Burman Nieves M.D.   On: 09/25/2023 00:09   DG Chest 2 View  Result Date: 09/24/2023 CLINICAL DATA:  Chest pain EXAM: CHEST - 2 VIEW COMPARISON:  CXR 04/23/21. CT chest 06/18/2022 FINDINGS: The heart and mediastinal contours are unchanged. Atherosclerotic plaque. Hyperinflation of the lungs. Biapical pleural/pulmonary scarring. No focal consolidation. No pulmonary edema. No pleural effusion. No pneumothorax. No acute osseous abnormality. Sternotomy wires intact. IMPRESSION: 1. No active cardiopulmonary disease. 2.  Aortic Atherosclerosis (ICD10-I70.0). Electronically Signed   By: Monica Frederickson M.D.   On: 09/24/2023 21:44    Microbiology: Results for orders placed or performed in visit on 11/09/21  Novel Coronavirus, NAA ([facility])     Status: None   Collection Time: 11/09/21 11:46 AM   Specimen: Nasopharyngeal(NP) swabs in vial transport medium  Result Value Ref Range Status   SARS-CoV-2, NAA Not Detected Not Detected Final    Comment: This nucleic acid amplification test was developed and its performance characteristics determined by [employer]. Nucleic acid  amplification tests include RT-PCR and TMA. This test has not been FDA cleared or approved. This test has been authorized by FDA under an Emergency Use Authorization (EUA). This test is only authorized for the duration of time the declaration that circumstances exist justifying the authorization of the emergency use of in vitro diagnostic tests for detection of SARS-CoV-2 virus and/or diagnosis of COVID-19 infection under section 564(b)(1) of the Act, 21 U.S.C. 981XBJ-4(N) (1), unless the authorization is terminated or revoked sooner. When diagnostic testing is negative, the possibility of a false negative result should be considered in the context of a patient's recent exposures and the presence  of clinical signs and symptoms consistent with COVID-19. An individual without symptoms of COVID-19 and who is not shedding SARS-CoV-2 virus wo uld expect to have a negative (not detected) result in this assay.   SARS-COV-2, NAA 2 DAY Voula Waln     Status: None   Collection Time: 11/09/21 11:46 AM  Result Value Ref Range Status   SARS-CoV-2, NAA 2 DAY Dyani Babel Performed  Final    Labs: CBC: Recent Labs  Lab 09/24/23 2045 09/25/23 0602  WBC 5.7 6.3  HGB 13.1 12.0  HCT 39.9 38.0  MCV 89.5 91.6  PLT 361 318   Basic Metabolic Panel: Recent Labs  Lab 09/24/23 2045 09/25/23 0602  NA 134* 138  K 3.6 3.9  CL 102 102  CO2 21* 27  GLUCOSE 103* 101*  BUN 13 11  CREATININE 0.71 0.71  CALCIUM 9.4 9.3  MG 1.8 1.9  PHOS  --  4.3   Liver Function Tests: Recent Labs  Lab 09/24/23 2045 09/25/23 0602  AST 19 17  ALT 14 12  ALKPHOS 88 76  BILITOT 0.3 0.4  PROT 7.7 6.8  ALBUMIN 4.1 3.6   CBG: No results for input(s): "GLUCAP" in the last 168 hours.  Discharge time spent: greater than 30 minutes.  Signed: Catarina Hartshorn, MD [facility] 09/27/2023

## 2023-09-28 ENCOUNTER — Telehealth: Payer: Self-pay

## 2023-09-28 ENCOUNTER — Telehealth: Payer: Self-pay | Admitting: *Deleted

## 2023-09-28 LAB — SURGICAL PATHOLOGY

## 2023-09-28 NOTE — Telephone Encounter (Signed)
Surgical Date: 09/26/2023 Procedure: XI ROBOTIC ASSISTED LAPAROSCOPIC CHOLECYSTECTOMY   Received call from patient [phone]~ telephone.  Patient reports that she received a note from Dr. Arbutus Leas, [facility] Hospitalist. Note excused patient from admission on 09/24/2023 through discharge on 09/27/2023 and recommended return to work on 10/03/2023.  Patient has post- op appointment on 10/12/2023. Patient would like to return to work after post op appointment.   Please advise.

## 2023-09-28 NOTE — Transitions of Care (Post Inpatient/ED Visit) (Addendum)
09/28/2023  Name: [name] MRN: [id number] DOB: Jul 22, 1966  Today's TOC FU Call Status: Today's TOC FU Call Status:: Successful TOC FU Call Completed TOC FU Call Complete Date: 09/28/23 Patient's Name and Date of Birth confirmed.  Transition Care Management Follow-up Telephone Call Date of Discharge: 09/27/23 [facility] Type of Discharge: Inpatient Admission Primary Inpatient Discharge Diagnosis:: Acute Cholecystitis How have you been since you were released from the hospital?: Same (patient states that she is sore in abdomen.) Any questions or concerns?: No  Items Reviewed: Did you receive and understand the discharge instructions provided?: Yes Medications obtained,verified, and reconciled?: Yes (Medications Reviewed) Any new allergies since your discharge?: No Dietary orders reviewed?: Yes Type of Diet Ordered:: low salt heart healthy diet Do you have support at home?: Yes People in Home: spouse Name of Support/Comfort Primary Source: husband  Medications Reviewed Today: Medications Reviewed Today     Reviewed by Earlie Server, RN (Registered Nurse) on 09/28/23 at 0900  Med List Status: <None>   Medication Order Taking? Sig Documenting Provider Last Dose Status Informant  acetaminophen (TYLENOL) 500 MG tablet [id number] Yes Take 2 tablets (1,000 mg total) by mouth every 6 (six) hours for 7 days. Pappayliou, Gustavus Messing, DO Taking Active   albuterol (VENTOLIN HFA) 108 (90 Base) MCG/ACT inhaler [id number] Yes Inhale 2 puffs into the lungs every 6 (six) hours as needed for wheezing or shortness of breath. [provider] Taking Active Self  ALPRAZolam Prudy Feeler) 0.5 MG tablet [id number] Yes TAKE 1 TABLET BY MOUTH THREE TIMES DAILY AS NEEDED  Patient taking differently: Take 0.5 mg by mouth 3 (three) times daily as needed for anxiety.   Mozingo, Thereasa Solo, NP Taking Active Self  aspirin EC 81 MG tablet [id number] Yes Take 1 tablet (81 mg  total) by mouth daily. Pappayliou, Gustavus Messing, DO Taking Active   budesonide-formoterol (SYMBICORT) 160-4.5 MCG/ACT inhaler [id number] Yes 1-2 puffs every 12 hours  Patient taking differently: Inhale 2 puffs into the lungs in the morning and at bedtime.   Nyoka Cowden, MD Taking Active Self  buPROPion (WELLBUTRIN XL) 150 MG 24 hr tablet [id number] Yes TAKE 1 TABLET BY MOUTH DAILY Mozingo, Thereasa Solo, NP Taking Active Self  Cholecalciferol (VITAMIN D3 PO) [id number] Yes Take 1 tablet by mouth daily. [provider] Taking Active Self  cyclobenzaprine (FLEXERIL) 10 MG tablet [id number] No Take 10 mg by mouth 2 (two) times daily as needed for muscle spasms.   Patient not taking: Reported on 09/28/2023   [provider] Not Taking Active Self  diphenoxylate-atropine (LOMOTIL) 2.5-0.025 MG tablet [id number] No Take 2.5 mg by mouth daily as needed for diarrhea or loose stools.  Patient not taking: Reported on 09/28/2023   [provider] Not Taking Active Self  docusate sodium (COLACE) 100 MG capsule [id number] Yes Take 1 capsule (100 mg total) by mouth 2 (two) times daily. Pappayliou, Gustavus Messing, DO Taking Active   esomeprazole (NEXIUM) 40 MG capsule [id number] Yes TAKE ONE CAPSULE BY MOUTH DAILY Billie Lade, MD Taking Active Self  Evolocumab [facility]) 140 MG/ML Ivory Broad [id number] Yes Inject 140 mg into the skin every 14 (fourteen) days. Antoine Poche, MD Taking Active Self           Med Note Elesa Massed, Johnnette Litter Sep 25, 2023  3:29 PM) Pt stated, due to cost, she will have to stop taking this soon.  FLUoxetine (PROZAC) 40 MG capsule [id number] Yes  Take 1 capsule (40 mg total) by mouth 2 (two) times daily. Mozingo, Thereasa Solo, NP Taking Active Self  fluticasone (FLONASE) 50 MCG/ACT nasal spray [id number] Yes Place 2 sprays into both nostrils daily. Donell Beers, FNP Taking Active Self  furosemide (LASIX) 40 MG tablet [id number] Yes Take 1 tablet (40  mg total) by mouth daily. Antoine Poche, MD Taking Active Self  losartan (COZAAR) 25 MG tablet [id number] Yes Take 1 tablet (25 mg total) by mouth daily. Antoine Poche, MD Taking Active Self  Multiple Vitamins-Minerals (CENTRUM SILVER PO) [id number] Yes Take 1 tablet by mouth daily. [provider] Taking Active Self  nitroGLYCERIN (NITROSTAT) 0.4 MG SL tablet [id number] No Place 1 tablet (0.4 mg total) under the tongue every 5 (five) minutes x 3 doses as needed for chest pain (if no relief after 3rd dose, proceed to the ED for an evaluation).  Patient not taking: Reported on 09/28/2023   Antoine Poche, MD Not Taking Active Self  ondansetron [facility]) 4 MG tablet [id number] Yes Take 1 tablet (4 mg total) by mouth daily as needed for nausea or vomiting. Pappayliou, Gustavus Messing, DO Taking Active   oxyCODONE (ROXICODONE) 5 MG immediate release tablet [id number] Yes Take 1 tablet (5 mg total) by mouth every 6 (six) hours as needed. Pappayliou, Gustavus Messing, DO Taking Active   rOPINIRole (REQUIP) 1 MG tablet [id number] No TAKE 1 TABLET BY MOUTH AT BEDTIME AS NEEDED  Patient not taking: Reported on 09/28/2023   Billie Lade, MD Not Taking Active Self  vitamin B-12 (CYANOCOBALAMIN) 100 MCG tablet [id number] Yes Take 100 mcg by mouth daily. [provider] Taking Active Self  zolpidem (AMBIEN) 10 MG tablet [id number] Yes TAKE 1 TABLET BY MOUTH AT BEDTIME  Patient taking differently: Take 10 mg by mouth at bedtime as needed for sleep.   Mozingo, Thereasa Solo, NP Taking Active Self            Home Care and Equipment/Supplies: Were Home Health Services Ordered?: No Any new equipment or medical supplies ordered?: No  Functional Questionnaire: Do you need assistance with bathing/showering or dressing?: No Do you need assistance with meal preparation?: No Do you need assistance with eating?: No Do you have difficulty maintaining continence: No Do you need assistance with  getting out of bed/getting out of a chair/moving?: No Do you have difficulty managing or taking your medications?: No  Follow up appointments reviewed: PCP Follow-up appointment confirmed?: No MD Provider Line [phone] Given: No Specialist Hospital Follow-up appointment confirmed?: Yes Date of Specialist follow-up appointment?: 10/12/23 Follow-Up Specialty Provider:: Psppsyliou Do you need transportation to your follow-up appointment?: No Do you understand care options if your condition(s) worsen?: Yes-patient verbalized understanding  SDOH Interventions Today    Flowsheet Row Most Recent Value  SDOH Interventions   Food Insecurity Interventions Intervention Not Indicated  Housing Interventions Intervention Not Indicated  Transportation Interventions Intervention Not Indicated      Patient reports that she is doing well. Reports she continues to be sore.  Patient reports that she needs a note for work. Reviewed the note provided by the hospitalist, however patient reports that surgeon wanted patient out longer than a few day. Encouraged patient to outreach the surgeon for that work note.  Reviewed with patient the surgeon contact information. Reviewed importance of having regular bowel movements after surgery. Patient had a BM on 09/27/2023.  Reviewed with patient that pain medications cause constipation. Encouraged patient to be active as she can,  drink lots of water and take stool softeners.    Reviewed importance of hospital follow up with PCP and patient wishes to call PCP office herself to make an appointment.  Reviewed and offered 30 day TOC program and patient declined.  Provided my contact information if patient needs assistance in the future.   Lonia Chimera, RN, BSN, CEN [employer]- Transition of Care Team.  [facility] [phone]

## 2023-09-28 NOTE — Telephone Encounter (Signed)
Letter transcribed.   Call placed to patient. LMTRC.

## 2023-09-29 NOTE — Telephone Encounter (Signed)
Call placed to patient and patient made aware.   Patient states that she printed letter from MyChart.

## 2023-09-30 ENCOUNTER — Emergency Department (HOSPITAL_COMMUNITY): Payer: BC Managed Care – PPO

## 2023-09-30 ENCOUNTER — Inpatient Hospital Stay (HOSPITAL_COMMUNITY)
Admission: EM | Admit: 2023-09-30 | Discharge: 2023-10-03 | DRG: 281 | Disposition: A | Discharge status: Home / Self Care | Payer: BC Managed Care – PPO | Attending: Family Medicine | Admitting: Family Medicine

## 2023-09-30 ENCOUNTER — Encounter (HOSPITAL_COMMUNITY): Payer: Self-pay

## 2023-09-30 ENCOUNTER — Other Ambulatory Visit: Payer: Self-pay

## 2023-09-30 DIAGNOSIS — E785 Hyperlipidemia, unspecified: Secondary | ICD-10-CM | POA: Diagnosis not present

## 2023-09-30 DIAGNOSIS — I259 Chronic ischemic heart disease, unspecified: Secondary | ICD-10-CM | POA: Diagnosis not present

## 2023-09-30 DIAGNOSIS — R110 Nausea: Secondary | ICD-10-CM | POA: Diagnosis not present

## 2023-09-30 DIAGNOSIS — K219 Gastro-esophageal reflux disease without esophagitis: Secondary | ICD-10-CM | POA: Insufficient documentation

## 2023-09-30 DIAGNOSIS — G2581 Restless legs syndrome: Secondary | ICD-10-CM | POA: Diagnosis not present

## 2023-09-30 DIAGNOSIS — Z8249 Family history of ischemic heart disease and other diseases of the circulatory system: Secondary | ICD-10-CM | POA: Diagnosis not present

## 2023-09-30 DIAGNOSIS — R945 Abnormal results of liver function studies: Secondary | ICD-10-CM | POA: Diagnosis not present

## 2023-09-30 DIAGNOSIS — Z811 Family history of alcohol abuse and dependence: Secondary | ICD-10-CM

## 2023-09-30 DIAGNOSIS — J449 Chronic obstructive pulmonary disease, unspecified: Secondary | ICD-10-CM | POA: Diagnosis not present

## 2023-09-30 DIAGNOSIS — Z888 Allergy status to other drugs, medicaments and biological substances status: Secondary | ICD-10-CM

## 2023-09-30 DIAGNOSIS — F32A Depression, unspecified: Secondary | ICD-10-CM | POA: Diagnosis not present

## 2023-09-30 DIAGNOSIS — F319 Bipolar disorder, unspecified: Secondary | ICD-10-CM | POA: Diagnosis not present

## 2023-09-30 DIAGNOSIS — I110 Hypertensive heart disease with heart failure: Secondary | ICD-10-CM | POA: Diagnosis present

## 2023-09-30 DIAGNOSIS — M797 Fibromyalgia: Secondary | ICD-10-CM | POA: Diagnosis present

## 2023-09-30 DIAGNOSIS — Z8673 Personal history of transient ischemic attack (TIA), and cerebral infarction without residual deficits: Secondary | ICD-10-CM | POA: Diagnosis not present

## 2023-09-30 DIAGNOSIS — Z7951 Long term (current) use of inhaled steroids: Secondary | ICD-10-CM

## 2023-09-30 DIAGNOSIS — Z823 Family history of stroke: Secondary | ICD-10-CM | POA: Diagnosis not present

## 2023-09-30 DIAGNOSIS — I249 Acute ischemic heart disease, unspecified: Secondary | ICD-10-CM | POA: Diagnosis not present

## 2023-09-30 DIAGNOSIS — R7989 Other specified abnormal findings of blood chemistry: Secondary | ICD-10-CM

## 2023-09-30 DIAGNOSIS — Z79899 Other long term (current) drug therapy: Secondary | ICD-10-CM | POA: Diagnosis not present

## 2023-09-30 DIAGNOSIS — G4733 Obstructive sleep apnea (adult) (pediatric): Secondary | ICD-10-CM | POA: Diagnosis not present

## 2023-09-30 DIAGNOSIS — R079 Chest pain, unspecified: Secondary | ICD-10-CM | POA: Diagnosis not present

## 2023-09-30 DIAGNOSIS — Z9049 Acquired absence of other specified parts of digestive tract: Secondary | ICD-10-CM | POA: Diagnosis not present

## 2023-09-30 DIAGNOSIS — I214 Non-ST elevation (NSTEMI) myocardial infarction: Principal | ICD-10-CM

## 2023-09-30 DIAGNOSIS — Z833 Family history of diabetes mellitus: Secondary | ICD-10-CM

## 2023-09-30 DIAGNOSIS — Z122 Encounter for screening for malignant neoplasm of respiratory organs: Secondary | ICD-10-CM

## 2023-09-30 DIAGNOSIS — I5032 Chronic diastolic (congestive) heart failure: Secondary | ICD-10-CM | POA: Diagnosis not present

## 2023-09-30 DIAGNOSIS — I252 Old myocardial infarction: Secondary | ICD-10-CM | POA: Diagnosis not present

## 2023-09-30 DIAGNOSIS — I25810 Atherosclerosis of coronary artery bypass graft(s) without angina pectoris: Secondary | ICD-10-CM | POA: Diagnosis present

## 2023-09-30 DIAGNOSIS — I4510 Unspecified right bundle-branch block: Secondary | ICD-10-CM | POA: Diagnosis not present

## 2023-09-30 DIAGNOSIS — R1011 Right upper quadrant pain: Secondary | ICD-10-CM | POA: Diagnosis not present

## 2023-09-30 DIAGNOSIS — J439 Emphysema, unspecified: Secondary | ICD-10-CM | POA: Diagnosis not present

## 2023-09-30 DIAGNOSIS — F419 Anxiety disorder, unspecified: Secondary | ICD-10-CM | POA: Insufficient documentation

## 2023-09-30 DIAGNOSIS — Z810 Family history of intellectual disabilities: Secondary | ICD-10-CM

## 2023-09-30 DIAGNOSIS — G4700 Insomnia, unspecified: Secondary | ICD-10-CM | POA: Diagnosis present

## 2023-09-30 DIAGNOSIS — I10 Essential (primary) hypertension: Secondary | ICD-10-CM | POA: Diagnosis not present

## 2023-09-30 DIAGNOSIS — I2510 Atherosclerotic heart disease of native coronary artery without angina pectoris: Secondary | ICD-10-CM | POA: Diagnosis present

## 2023-09-30 DIAGNOSIS — Z7982 Long term (current) use of aspirin: Secondary | ICD-10-CM

## 2023-09-30 DIAGNOSIS — Z87891 Personal history of nicotine dependence: Secondary | ICD-10-CM

## 2023-09-30 DIAGNOSIS — R001 Bradycardia, unspecified: Secondary | ICD-10-CM | POA: Diagnosis not present

## 2023-09-30 LAB — COMPREHENSIVE METABOLIC PANEL
ALT: 89 U/L — ABNORMAL HIGH (ref 0–44)
AST: 160 U/L — ABNORMAL HIGH (ref 15–41)
Albumin: 3.6 g/dL (ref 3.5–5.0)
Alkaline Phosphatase: 189 U/L — ABNORMAL HIGH (ref 38–126)
Anion gap: 10 (ref 5–15)
BUN: 10 mg/dL (ref 6–20)
CO2: 22 mmol/L (ref 22–32)
Calcium: 9.2 mg/dL (ref 8.9–10.3)
Chloride: 104 mmol/L (ref 98–111)
Creatinine, Ser: 0.76 mg/dL (ref 0.44–1.00)
GFR, Estimated: >60 mL/min (ref >60)
Glucose, Bld: 133 mg/dL — ABNORMAL HIGH (ref 70–99)
Potassium: 3.8 mmol/L (ref 3.5–5.1)
Sodium: 136 mmol/L (ref 135–145)
Total Bilirubin: 0.6 mg/dL (ref <1.2)
Total Protein: 6.9 g/dL (ref 6.5–8.1)

## 2023-09-30 LAB — TROPONIN I (HIGH SENSITIVITY)
Troponin I (High Sensitivity): 15 ng/L (ref <18)
Troponin I (High Sensitivity): 109 ng/L (ref <18)

## 2023-09-30 LAB — D-DIMER, QUANTITATIVE: D-Dimer, Quant: 1.53 ug{FEU}/mL — ABNORMAL HIGH (ref 0.00–0.50)

## 2023-09-30 MED ORDER — HEPARIN BOLUS VIA INFUSION
3200.0000 [IU] | Freq: Once | INTRAVENOUS | Status: AC
  Administered 2023-09-30: 3200 [IU] via INTRAVENOUS

## 2023-09-30 MED ORDER — HEPARIN (PORCINE) 25000 UT/250ML-% IV SOLN
850.0000 [IU]/h | INTRAVENOUS | Status: DC
  Administered 2023-09-30: 700 [IU]/h via INTRAVENOUS
  Administered 2023-10-02 – 2023-10-03 (×2): 850 [IU]/h via INTRAVENOUS
  Filled 2023-09-30 (×3): qty 250

## 2023-09-30 MED ORDER — MORPHINE SULFATE (PF) 4 MG/ML IV SOLN
4.0000 mg | Freq: Once | INTRAVENOUS | Status: AC
  Administered 2023-09-30: 4 mg via INTRAVENOUS
  Filled 2023-09-30: qty 1

## 2023-09-30 MED ORDER — ONDANSETRON HCL 4 MG/2ML IJ SOLN
4.0000 mg | Freq: Once | INTRAMUSCULAR | Status: AC
  Administered 2023-09-30: 4 mg via INTRAVENOUS
  Filled 2023-09-30: qty 2

## 2023-09-30 MED ORDER — IOHEXOL 350 MG/ML SOLN
75.0000 mL | Freq: Once | INTRAVENOUS | Status: AC | PRN
  Administered 2023-09-30: 75 mL via INTRAVENOUS

## 2023-09-30 MED ORDER — ASPIRIN 81 MG PO CHEW
324.0000 mg | CHEWABLE_TABLET | Freq: Once | ORAL | Status: AC
  Administered 2023-09-30: 324 mg via ORAL
  Filled 2023-09-30: qty 4

## 2023-09-30 NOTE — Progress Notes (Signed)
PHARMACY - ANTICOAGULATION CONSULT NOTE  Pharmacy Consult for heparin Indication: chest pain/ACS  Allergies  Allergen Reactions   Isosorbide Nitrate Other (See Comments)    Headaches; Worsening Chest Pain    Patient Measurements: Height: 5\' 2"  (157.5 cm) Weight: 54.8 kg (120 lb 13 oz) IBW/kg (Calculated) : 50.1 Heparin Dosing Weight: 54.8kg   Vital Signs: Temp: 97.8 F (36.6 C) (12/06 2003) Temp Source: Oral (12/06 2003) BP: 143/68 (12/06 2215) Pulse Rate: 42 (12/06 2215)  Labs: Recent Labs    09/30/23 2006 09/30/23 2151  CREATININE 0.76  --   TROPONINIHS 15 109*    Estimated Creatinine Clearance: 61.4 mL/min (by C-G formula based on SCr of 0.76 mg/dL).  Assessment: 16 yoF presented to ED with epigastric chest discomfort. Pharmacy consulted to dose heparin for ACS. PMH includes: CABG x3 (2009), HTN, HLD, recent hospitalization for acute cholecystitis.   -Last CBC on 12/1: Hgb 12, plts 318 -No PTA anticoagulation -Troponin 109, d-dimer pending  Goal of Therapy:  Heparin level 0.3-0.7 units/ml Monitor platelets by anticoagulation protocol: Yes   Plan:  Give 3200 units bolus x 1 Start heparin infusion at 700 units/hr Check anti-Xa level in 6 hours and daily while on heparin Continue to monitor H&H and platelets  Arabella Merles, PharmD. Clinical Pharmacist 09/30/2023 11:17 PM

## 2023-09-30 NOTE — ED Notes (Signed)
Date and time results received: 09/30/23 2237   Test: TROPONIN Critical Value: 109  Name of Provider Notified: Estell Harpin, MD

## 2023-09-30 NOTE — ED Notes (Signed)
Patient transported to CT 

## 2023-09-30 NOTE — ED Provider Notes (Signed)
[facility] AT [facility] Provider Note   CSN: [id number] Arrival date & time: 09/30/23  1954     History {Add pertinent medical, surgical, social history, OB history to HPI:1} Chief Complaint  Patient presents with   Chest Pain    [name] is a 57 y.o. female.  The history is provided by the patient.  Chest Pain She has history of hypertension, hyperlipidemia, coronary disease status post coronary artery bypass graft x 3 (2009, per), COPD, diastolic heart failure, stroke and is 4 days status post cholecystectomy and comes in because of pain in the epigastric area and lower chest radiating to the back.  She describes a pressure, tight feeling with some associated nausea and diaphoresis but no dyspnea or vomiting.  She cannot recall if this pain is similar to what she has had with heart problems in the past.  She thought it might be a pulled muscle and she tried stretching.  She took acetaminophen without relief.  Pain did not seem to be affected by walking or breathing.   Home Medications Prior to Admission medications   Medication Sig Start Date End Date Taking? Authorizing Provider  acetaminophen (TYLENOL) 500 MG tablet Take 2 tablets (1,000 mg total) by mouth every 6 (six) hours for 7 days. 09/26/23 10/03/23  Pappayliou, Santina Evans A, DO  albuterol (VENTOLIN HFA) 108 (90 Base) MCG/ACT inhaler Inhale 2 puffs into the lungs every 6 (six) hours as needed for wheezing or shortness of breath. 07/24/23   [provider]  ALPRAZolam (XANAX) 0.5 MG tablet TAKE 1 TABLET BY MOUTH THREE TIMES DAILY AS NEEDED Patient taking differently: Take 0.5 mg by mouth 3 (three) times daily as needed for anxiety. 05/06/23   Mozingo, Thereasa Solo, NP  aspirin EC 81 MG tablet Take 1 tablet (81 mg total) by mouth daily. 09/28/23   Pappayliou, Santina Evans A, DO  budesonide-formoterol (SYMBICORT) 160-4.5 MCG/ACT inhaler 1-2 puffs every 12 hours Patient taking  differently: Inhale 2 puffs into the lungs in the morning and at bedtime. 03/10/22   Nyoka Cowden, MD  buPROPion (WELLBUTRIN XL) 150 MG 24 hr tablet TAKE 1 TABLET BY MOUTH DAILY 09/19/23   Mozingo, Thereasa Solo, NP  Cholecalciferol (VITAMIN D3 PO) Take 1 tablet by mouth daily.    [provider]  cyclobenzaprine (FLEXERIL) 10 MG tablet Take 10 mg by mouth 2 (two) times daily as needed for muscle spasms.  Patient not taking: Reported on 09/28/2023    [provider]  diphenoxylate-atropine (LOMOTIL) 2.5-0.025 MG tablet Take 2.5 mg by mouth daily as needed for diarrhea or loose stools. Patient not taking: Reported on 09/28/2023    [provider]  docusate sodium (COLACE) 100 MG capsule Take 1 capsule (100 mg total) by mouth 2 (two) times daily. 09/26/23 09/25/24  Pappayliou, Santina Evans A, DO  esomeprazole (NEXIUM) 40 MG capsule TAKE ONE CAPSULE BY MOUTH DAILY 07/25/23   Billie Lade, MD  Evolocumab (REPATHA SURECLICK) 140 MG/ML SOAJ Inject 140 mg into the skin every 14 (fourteen) days. 06/15/23   Antoine Poche, MD  FLUoxetine (PROZAC) 40 MG capsule Take 1 capsule (40 mg total) by mouth 2 (two) times daily. 12/30/22   Mozingo, Thereasa Solo, NP  fluticasone (FLONASE) 50 MCG/ACT nasal spray Place 2 sprays into both nostrils daily. 11/09/21   Donell Beers, FNP  furosemide (LASIX) 40 MG tablet Take 1 tablet (40 mg total) by mouth daily. 12/10/22   Antoine Poche, MD  losartan (COZAAR) 25 MG tablet Take 1 tablet (25 mg total) by mouth daily. 06/15/23   Antoine Poche, MD  Multiple Vitamins-Minerals (CENTRUM SILVER PO) Take 1 tablet by mouth daily.    [provider]  nitroGLYCERIN (NITROSTAT) 0.4 MG SL tablet Place 1 tablet (0.4 mg total) under the tongue every 5 (five) minutes x 3 doses as needed for chest pain (if no relief after 3rd dose, proceed to the ED for an evaluation). Patient not taking: Reported on 09/28/2023 06/15/23   Antoine Poche, MD   ondansetron (ZOFRAN) 4 MG tablet Take 1 tablet (4 mg total) by mouth daily as needed for nausea or vomiting. 09/26/23 09/25/24  Pappayliou, Santina Evans A, DO  oxyCODONE (ROXICODONE) 5 MG immediate release tablet Take 1 tablet (5 mg total) by mouth every 6 (six) hours as needed. 09/26/23   Pappayliou, Santina Evans A, DO  rOPINIRole (REQUIP) 1 MG tablet TAKE 1 TABLET BY MOUTH AT BEDTIME AS NEEDED Patient not taking: Reported on 09/28/2023 03/14/23   Billie Lade, MD  vitamin B-12 (CYANOCOBALAMIN) 100 MCG tablet Take 100 mcg by mouth daily.    [provider]  zolpidem (AMBIEN) 10 MG tablet TAKE 1 TABLET BY MOUTH AT BEDTIME Patient taking differently: Take 10 mg by mouth at bedtime as needed for sleep. 05/06/23   Mozingo, Thereasa Solo, NP      Allergies    Isosorbide nitrate    Review of Systems   Review of Systems  Cardiovascular:  Positive for chest pain.  All other systems reviewed and are negative.   Physical Exam Updated Vital Signs BP (!) 145/67   Pulse (!) 46   Temp 97.8 F (36.6 C) (Oral)   Resp 16   Ht 5\' 2"  (1.575 m)   Wt 54.8 kg   LMP 10/22/2015   SpO2 97%   BMI 22.10 kg/m  Physical Exam Vitals and nursing note reviewed.   57 year old female, resting comfortably and in no acute distress. Vital signs are significant for slow heart rate and elevated blood pressure. Oxygen saturation is 97%, which is normal. Head is normocephalic and atraumatic. PERRLA, EOMI. Oropharynx is clear. Neck is nontender and supple without adenopathy or JVD. Back is nontender and there is no CVA tenderness. Lungs are clear without rales, wheezes, or rhonchi. Chest is nontender. Heart has regular rate and rhythm without murmur. Abdomen is soft, flat, with mild to moderate tenderness diffusely, worst in the epigastric area.  Scars from recent laparoscopic cholecystectomy are healing appropriately without signs of infection. Extremities have no cyanosis or edema, full range of motion is  present. Skin is warm and dry without rash. Neurologic: Mental status is normal, moves all extremities equally.  ED Results / Procedures / Treatments   Labs (all labs ordered are listed, but only abnormal results are displayed) Labs Reviewed  COMPREHENSIVE METABOLIC PANEL - Abnormal; Notable for the following components:      Result Value   Glucose, Bld 133 (*)    AST 160 (*)    ALT 89 (*)    Alkaline Phosphatase 189 (*)    All other components within normal limits  TROPONIN I (HIGH SENSITIVITY) - Abnormal; Notable for the following components:   Troponin I (High Sensitivity) 109 (*)    All other components within normal limits  TROPONIN I (HIGH SENSITIVITY)    EKG EKG Interpretation Date/Time:  Friday September 30 2023 20:03:29 EST Ventricular Rate:  49 PR Interval:  132 QRS Duration:  112  QT Interval:  491 QTC Calculation: 444 R Axis:   73  Text Interpretation: Sinus bradycardia Probable left atrial enlargement Incomplete right bundle branch block When compared with ECG of 09/24/2023, Premature ventricular complexes are no longer present Reconfirmed by Dione Booze ([id number]) on 09/30/2023 10:49:09 PM  Radiology DG Chest 2 View  Result Date: 09/30/2023 CLINICAL DATA:  Chest pain, recent cholecystectomy EXAM: CHEST - 2 VIEW COMPARISON:  09/24/2023 FINDINGS: Frontal and lateral views of the chest demonstrate postsurgical changes from CABG. Cardiac silhouette is unremarkable. No airspace disease, effusion, or pneumothorax. Stable emphysema. No acute bony abnormalities. IMPRESSION: 1. Stable emphysema.  No acute process. Electronically Signed   By: Sharlet Salina M.D.   On: 09/30/2023 20:50    Procedures Procedures  Cardiac monitor shows sinus bradycardia, per my interpretation.  Medications Ordered in ED Medications  aspirin chewable tablet 324 mg (has no administration in time range)  morphine (PF) 4 MG/ML injection 4 mg (has no administration in time range)  ondansetron  (ZOFRAN) injection 4 mg (has no administration in time range)    ED Course/ Medical Decision Making/ A&P   {   Click here for ABCD2, HEART and other calculatorsREFRESH Note before signing :1}                              Medical Decision Making Amount and/or Complexity of Data Reviewed Labs: ordered. Radiology: ordered.   Epigastric/chest discomfort and patient who is status post recent cholecystectomy and known history of coronary artery disease.  Differential diagnosis is broad and includes, but is not limited to, ACS, pulmonary embolism, GERD, retained common bile duct stone, pancreatitis, diverticulitis.  This is a differential which includes conditions with significant risk for morbidity and complications.  I have reviewed her electrocardiogram, and my interpretation is sinus bradycardia with incomplete right bundle branch block not significantly changed from prior with the exception of frequent PVCs present previously are no longer present.  Chest x-ray shows emphysema but no acute process.  I have independently viewed the images, and agree with the radiologist's interpretation.  I have reviewed her laboratory tests, and my interpretation is elevated random glucose level which will need to be followed as an outpatient, elevated alkaline phosphatase and transaminases concerning for possible common bile duct stone, normal initial troponin with elevated repeat troponin concerning for ACS.  Also, pulmonary embolism is still a possibility as you can have troponin leak with pulmonary embolism.  I have ordered a D-dimer to rule out pulmonary embolism.  I have ordered a dose of aspirin and I have ordered heparin.  Have also ordered morphine for pain (patient is intolerant of nitrates).  I have reviewed her past records and note hospitalization 09/24/2023-09/27/2023 with acute cholecystitis, laparoscopic cholecystectomy on 09/26/2023.  {Document critical care time when appropriate:1} {Document review of  labs and clinical decision tools ie heart score, Chads2Vasc2 etc:1}  {Document your independent review of radiology images, and any outside records:1} {Document your discussion with family members, caretakers, and with consultants:1} {Document social determinants of health affecting pt's care:1} {Document your decision making why or why not admission, treatments were needed:1} Final Clinical Impression(s) / ED Diagnoses Final diagnoses:  None    Rx / DC Orders ED Discharge Orders     None

## 2023-09-30 NOTE — ED Triage Notes (Signed)
Pt arrived from home via [facility] c/o epigastric chest discomfort that began apprx at 1800 tonight. Pt recently seen for cholecystectomy. Surgical Sites present intact, no obvious signs of infection. Pt reports pain shoots to her back.

## 2023-10-01 ENCOUNTER — Observation Stay (HOSPITAL_COMMUNITY): Payer: BC Managed Care – PPO

## 2023-10-01 DIAGNOSIS — I10 Essential (primary) hypertension: Secondary | ICD-10-CM

## 2023-10-01 DIAGNOSIS — F32A Depression, unspecified: Secondary | ICD-10-CM | POA: Insufficient documentation

## 2023-10-01 DIAGNOSIS — G2581 Restless legs syndrome: Secondary | ICD-10-CM | POA: Insufficient documentation

## 2023-10-01 DIAGNOSIS — Z833 Family history of diabetes mellitus: Secondary | ICD-10-CM | POA: Diagnosis not present

## 2023-10-01 DIAGNOSIS — Z79899 Other long term (current) drug therapy: Secondary | ICD-10-CM | POA: Diagnosis not present

## 2023-10-01 DIAGNOSIS — Z7982 Long term (current) use of aspirin: Secondary | ICD-10-CM | POA: Diagnosis not present

## 2023-10-01 DIAGNOSIS — I249 Acute ischemic heart disease, unspecified: Secondary | ICD-10-CM | POA: Diagnosis present

## 2023-10-01 DIAGNOSIS — I214 Non-ST elevation (NSTEMI) myocardial infarction: Secondary | ICD-10-CM | POA: Diagnosis not present

## 2023-10-01 DIAGNOSIS — Z8673 Personal history of transient ischemic attack (TIA), and cerebral infarction without residual deficits: Secondary | ICD-10-CM | POA: Diagnosis not present

## 2023-10-01 DIAGNOSIS — J439 Emphysema, unspecified: Secondary | ICD-10-CM | POA: Diagnosis not present

## 2023-10-01 DIAGNOSIS — F319 Bipolar disorder, unspecified: Secondary | ICD-10-CM | POA: Diagnosis not present

## 2023-10-01 DIAGNOSIS — I4510 Unspecified right bundle-branch block: Secondary | ICD-10-CM | POA: Diagnosis not present

## 2023-10-01 DIAGNOSIS — I110 Hypertensive heart disease with heart failure: Secondary | ICD-10-CM | POA: Diagnosis not present

## 2023-10-01 DIAGNOSIS — F419 Anxiety disorder, unspecified: Secondary | ICD-10-CM

## 2023-10-01 DIAGNOSIS — Z8249 Family history of ischemic heart disease and other diseases of the circulatory system: Secondary | ICD-10-CM | POA: Diagnosis not present

## 2023-10-01 DIAGNOSIS — Z87891 Personal history of nicotine dependence: Secondary | ICD-10-CM | POA: Diagnosis not present

## 2023-10-01 DIAGNOSIS — I252 Old myocardial infarction: Secondary | ICD-10-CM | POA: Diagnosis not present

## 2023-10-01 DIAGNOSIS — I2510 Atherosclerotic heart disease of native coronary artery without angina pectoris: Secondary | ICD-10-CM | POA: Diagnosis not present

## 2023-10-01 DIAGNOSIS — G4733 Obstructive sleep apnea (adult) (pediatric): Secondary | ICD-10-CM | POA: Diagnosis not present

## 2023-10-01 DIAGNOSIS — E785 Hyperlipidemia, unspecified: Secondary | ICD-10-CM | POA: Diagnosis not present

## 2023-10-01 DIAGNOSIS — J449 Chronic obstructive pulmonary disease, unspecified: Secondary | ICD-10-CM | POA: Diagnosis not present

## 2023-10-01 DIAGNOSIS — Z823 Family history of stroke: Secondary | ICD-10-CM | POA: Diagnosis not present

## 2023-10-01 DIAGNOSIS — M797 Fibromyalgia: Secondary | ICD-10-CM | POA: Diagnosis not present

## 2023-10-01 DIAGNOSIS — K219 Gastro-esophageal reflux disease without esophagitis: Secondary | ICD-10-CM | POA: Insufficient documentation

## 2023-10-01 DIAGNOSIS — I25810 Atherosclerosis of coronary artery bypass graft(s) without angina pectoris: Secondary | ICD-10-CM | POA: Diagnosis not present

## 2023-10-01 DIAGNOSIS — R1011 Right upper quadrant pain: Secondary | ICD-10-CM | POA: Diagnosis present

## 2023-10-01 DIAGNOSIS — G4700 Insomnia, unspecified: Secondary | ICD-10-CM | POA: Diagnosis not present

## 2023-10-01 DIAGNOSIS — I5032 Chronic diastolic (congestive) heart failure: Secondary | ICD-10-CM | POA: Diagnosis not present

## 2023-10-01 LAB — HEPARIN LEVEL (UNFRACTIONATED)
Heparin Unfractionated: 0.29 [IU]/mL — ABNORMAL LOW (ref 0.30–0.70)
Heparin Unfractionated: 0.31 [IU]/mL (ref 0.30–0.70)

## 2023-10-01 LAB — CBC
HCT: 38.1 % (ref 36.0–46.0)
Hemoglobin: 12 g/dL (ref 12.0–15.0)
MCH: 29.3 pg (ref 26.0–34.0)
MCHC: 31.5 g/dL (ref 30.0–36.0)
MCV: 93.2 fL (ref 80.0–100.0)
Platelets: 310 10*3/uL (ref 150–400)
RBC: 4.09 MIL/uL (ref 3.87–5.11)
RDW: 13.2 % (ref 11.5–15.5)
WBC: 4.9 10*3/uL (ref 4.0–10.5)
nRBC: 0 % (ref 0.0–0.2)

## 2023-10-01 LAB — MRSA NEXT GEN BY PCR, NASAL: MRSA by PCR Next Gen: NOT DETECTED

## 2023-10-01 LAB — LIPASE, BLOOD: Lipase: 33 U/L (ref 11–51)

## 2023-10-01 LAB — TROPONIN I (HIGH SENSITIVITY)
Troponin I (High Sensitivity): 347 ng/L (ref <18)
Troponin I (High Sensitivity): 398 ng/L (ref <18)

## 2023-10-01 MED ORDER — ALBUTEROL SULFATE (2.5 MG/3ML) 0.083% IN NEBU: 2.5000 mg | INHALATION_SOLUTION | Freq: Four times a day (QID) | RESPIRATORY_TRACT | Status: DC | PRN

## 2023-10-01 MED ORDER — ASPIRIN 300 MG RE SUPP: 300.0000 mg | RECTAL | Status: DC

## 2023-10-01 MED ORDER — PROCHLORPERAZINE EDISYLATE 10 MG/2ML IJ SOLN
10.0000 mg | INTRAMUSCULAR | Status: DC | PRN
  Administered 2023-10-01: 10 mg via INTRAVENOUS
  Filled 2023-10-01: qty 2

## 2023-10-01 MED ORDER — ASPIRIN 81 MG PO CHEW
81.0000 mg | CHEWABLE_TABLET | Freq: Every day | ORAL | Status: DC
Start: 2023-10-02 — End: 2023-10-03
  Administered 2023-10-02: 81 mg via ORAL
  Filled 2023-10-01 (×2): qty 1

## 2023-10-01 MED ORDER — ASPIRIN 325 MG PO TBEC
325.0000 mg | DELAYED_RELEASE_TABLET | Freq: Every day | ORAL | Status: DC
  Administered 2023-10-01: 325 mg via ORAL
  Filled 2023-10-01: qty 1

## 2023-10-01 MED ORDER — VITAMIN D 25 MCG (1000 UNIT) PO TABS
1000.0000 [IU] | ORAL_TABLET | Freq: Every day | ORAL | Status: DC
  Administered 2023-10-01 – 2023-10-03 (×3): 1000 [IU] via ORAL
  Filled 2023-10-01 (×3): qty 1

## 2023-10-01 MED ORDER — ZOLPIDEM TARTRATE 5 MG PO TABS
5.0000 mg | ORAL_TABLET | Freq: Every evening | ORAL | Status: DC | PRN
  Administered 2023-10-02: 5 mg via ORAL
  Filled 2023-10-01: qty 1

## 2023-10-01 MED ORDER — ASPIRIN 81 MG PO TBEC: 81.0000 mg | DELAYED_RELEASE_TABLET | Freq: Every day | ORAL | Status: DC

## 2023-10-01 MED ORDER — NITROGLYCERIN 0.4 MG SL SUBL: 0.4000 mg | SUBLINGUAL_TABLET | SUBLINGUAL | Status: DC | PRN

## 2023-10-01 MED ORDER — ALPRAZOLAM 0.5 MG PO TABS
0.5000 mg | ORAL_TABLET | Freq: Three times a day (TID) | ORAL | Status: DC | PRN
  Administered 2023-10-01 – 2023-10-02 (×3): 0.5 mg via ORAL
  Filled 2023-10-01 (×3): qty 1

## 2023-10-01 MED ORDER — ATORVASTATIN CALCIUM 80 MG PO TABS
80.0000 mg | ORAL_TABLET | Freq: Every day | ORAL | Status: DC
  Administered 2023-10-01 – 2023-10-03 (×3): 80 mg via ORAL
  Filled 2023-10-01 (×2): qty 1
  Filled 2023-10-01: qty 2

## 2023-10-01 MED ORDER — VITAMIN B-12 100 MCG PO TABS
100.0000 ug | ORAL_TABLET | Freq: Every day | ORAL | Status: DC
  Administered 2023-10-01 – 2023-10-03 (×3): 100 ug via ORAL
  Filled 2023-10-01 (×3): qty 1

## 2023-10-01 MED ORDER — HEPARIN BOLUS VIA INFUSION
1000.0000 [IU] | Freq: Once | INTRAVENOUS | Status: AC
  Administered 2023-10-01: 1000 [IU] via INTRAVENOUS

## 2023-10-01 MED ORDER — ACETAMINOPHEN 325 MG PO TABS: 650.0000 mg | ORAL_TABLET | ORAL | Status: DC | PRN

## 2023-10-01 MED ORDER — ROPINIROLE HCL 1 MG PO TABS: 1.0000 mg | ORAL_TABLET | Freq: Every evening | ORAL | Status: DC | PRN

## 2023-10-01 MED ORDER — PANTOPRAZOLE SODIUM 40 MG PO TBEC
40.0000 mg | DELAYED_RELEASE_TABLET | Freq: Every day | ORAL | Status: DC
  Administered 2023-10-01 – 2023-10-03 (×3): 40 mg via ORAL
  Filled 2023-10-01 (×3): qty 1

## 2023-10-01 MED ORDER — DOCUSATE SODIUM 100 MG PO CAPS
100.0000 mg | ORAL_CAPSULE | Freq: Two times a day (BID) | ORAL | Status: DC
Start: 2023-10-01 — End: 2023-10-03
  Administered 2023-10-01 – 2023-10-02 (×4): 100 mg via ORAL
  Filled 2023-10-01 (×5): qty 1

## 2023-10-01 MED ORDER — PANTOPRAZOLE SODIUM 40 MG PO TBEC: 40.0000 mg | DELAYED_RELEASE_TABLET | Freq: Every day | ORAL | Status: DC

## 2023-10-01 MED ORDER — ALBUTEROL SULFATE HFA 108 (90 BASE) MCG/ACT IN AERS: 2.0000 | INHALATION_SPRAY | Freq: Four times a day (QID) | RESPIRATORY_TRACT | Status: DC | PRN

## 2023-10-01 MED ORDER — ONDANSETRON HCL 4 MG/2ML IJ SOLN
4.0000 mg | Freq: Four times a day (QID) | INTRAMUSCULAR | Status: DC | PRN
  Administered 2023-10-01: 4 mg via INTRAVENOUS
  Filled 2023-10-01: qty 2

## 2023-10-01 MED ORDER — MORPHINE SULFATE (PF) 4 MG/ML IV SOLN
4.0000 mg | Freq: Once | INTRAVENOUS | Status: AC
  Administered 2023-10-01: 4 mg via INTRAVENOUS
  Filled 2023-10-01: qty 1

## 2023-10-01 MED ORDER — MORPHINE SULFATE (PF) 2 MG/ML IV SOLN
2.0000 mg | INTRAVENOUS | Status: DC | PRN
  Administered 2023-10-01: 2 mg via INTRAVENOUS
  Filled 2023-10-01: qty 1

## 2023-10-01 MED ORDER — FUROSEMIDE 40 MG PO TABS
40.0000 mg | ORAL_TABLET | Freq: Every day | ORAL | Status: DC
  Administered 2023-10-01: 40 mg via ORAL
  Filled 2023-10-01: qty 1

## 2023-10-01 MED ORDER — SODIUM CHLORIDE 0.9 % IV SOLN: INTRAVENOUS | Status: AC

## 2023-10-01 MED ORDER — LOSARTAN POTASSIUM 25 MG PO TABS
25.0000 mg | ORAL_TABLET | Freq: Every day | ORAL | Status: DC
  Administered 2023-10-01 – 2023-10-02 (×2): 25 mg via ORAL
  Filled 2023-10-01 (×2): qty 1

## 2023-10-01 MED ORDER — PERFLUTREN LIPID MICROSPHERE
1.0000 mL | INTRAVENOUS | Status: AC | PRN
  Administered 2023-10-01: 3 mL via INTRAVENOUS

## 2023-10-01 MED ORDER — ADULT MULTIVITAMIN W/MINERALS CH
ORAL_TABLET | Freq: Every day | ORAL | Status: DC
  Administered 2023-10-01 – 2023-10-03 (×3): 1 via ORAL
  Filled 2023-10-01 (×3): qty 1

## 2023-10-01 MED ORDER — OXYCODONE HCL 5 MG PO TABS
5.0000 mg | ORAL_TABLET | Freq: Four times a day (QID) | ORAL | Status: DC | PRN
  Administered 2023-10-01: 5 mg via ORAL
  Filled 2023-10-01: qty 1

## 2023-10-01 MED ORDER — FLUTICASONE PROPIONATE 50 MCG/ACT NA SUSP
2.0000 | Freq: Every day | NASAL | Status: DC
  Administered 2023-10-02 – 2023-10-03 (×2): 2 via NASAL
  Filled 2023-10-01: qty 16

## 2023-10-01 MED ORDER — ASPIRIN 81 MG PO CHEW: 324.0000 mg | CHEWABLE_TABLET | ORAL | Status: DC

## 2023-10-01 MED ORDER — BUPROPION HCL ER (XL) 150 MG PO TB24
150.0000 mg | ORAL_TABLET | Freq: Every day | ORAL | Status: DC
  Administered 2023-10-01 – 2023-10-03 (×2): 150 mg via ORAL
  Filled 2023-10-01 (×3): qty 1

## 2023-10-01 MED ORDER — FLUOXETINE HCL 20 MG PO CAPS
40.0000 mg | ORAL_CAPSULE | Freq: Two times a day (BID) | ORAL | Status: DC
  Administered 2023-10-01 – 2023-10-03 (×5): 40 mg via ORAL
  Filled 2023-10-01 (×5): qty 2

## 2023-10-01 NOTE — ED Notes (Signed)
Attempted to call report on pt RN off the floor will call this RN in 5 mins

## 2023-10-01 NOTE — Progress Notes (Signed)
PHARMACY - ANTICOAGULATION CONSULT NOTE  Pharmacy Consult for heparin Indication: chest pain/ACS  Allergies  Allergen Reactions   Isosorbide Nitrate Other (See Comments)    Headaches; Worsening Chest Pain    Patient Measurements: Height: 5\' 2"  (157.5 cm) Weight: 54.8 kg (120 lb 13 oz) IBW/kg (Calculated) : 50.1 Heparin Dosing Weight: 54.8kg   Vital Signs: Temp: 98.2 F (36.8 C) (12/07 1601) Temp Source: Oral (12/07 1601) BP: 142/67 (12/07 1601) Pulse Rate: 54 (12/07 1601)  Labs: Recent Labs    09/30/23 2006 09/30/23 2151 10/01/23 0800 10/01/23 1638  HGB  --   --  12.0  --   HCT  --   --  38.1  --   PLT  --   --  310  --   HEPARINUNFRC  --   --  0.29* 0.31  CREATININE 0.76  --   --   --   TROPONINIHS 15 109* 347*  --     Estimated Creatinine Clearance: 61.4 mL/min (by C-G formula based on SCr of 0.76 mg/dL).  Assessment: 75 yoF presented to ED with epigastric chest discomfort. Pharmacy consulted to dose heparin for ACS. PMH includes: CABG x3 (2009), HTN, HLD, recent hospitalization for acute cholecystitis.   -Last CBC on 12/1: Hgb 12, plts 318 -No PTA anticoagulation -Troponin 109, d-dimer 1.53  Heparin level came back therapeutic but on the lower side. We will increase slightly and check in Am.  Goal of Therapy:  Heparin level 0.3-0.7 units/ml Monitor platelets by anticoagulation protocol: Yes   Plan:  Increase heparin infusion 850units/hr Check anti-Xa level in AM and daily while on heparin Continue to monitor H&H and platelets  Ulyses Southward, PharmD, BCIDP, AAHIVP, CPP Infectious Disease Pharmacist 10/01/2023 5:27 PM

## 2023-10-01 NOTE — Plan of Care (Signed)

## 2023-10-01 NOTE — Progress Notes (Signed)
PHARMACY - ANTICOAGULATION CONSULT NOTE  Pharmacy Consult for heparin Indication: chest pain/ACS  Allergies  Allergen Reactions   Isosorbide Nitrate Other (See Comments)    Headaches; Worsening Chest Pain    Patient Measurements: Height: 5\' 2"  (157.5 cm) Weight: 54.8 kg (120 lb 13 oz) IBW/kg (Calculated) : 50.1 Heparin Dosing Weight: 54.8kg   Vital Signs: Temp: 97.9 F (36.6 C) (12/07 0600) Temp Source: Oral (12/07 0600) BP: 153/63 (12/07 0800) Pulse Rate: 45 (12/07 0800)  Labs: Recent Labs    09/30/23 2006 09/30/23 2151 10/01/23 0800  HGB  --   --  12.0  HCT  --   --  38.1  PLT  --   --  310  HEPARINUNFRC  --   --  0.29*  CREATININE 0.76  --   --   TROPONINIHS 15 109*  --     Estimated Creatinine Clearance: 61.4 mL/min (by C-G formula based on SCr of 0.76 mg/dL).  Assessment: 41 yoF presented to ED with epigastric chest discomfort. Pharmacy consulted to dose heparin for ACS. PMH includes: CABG x3 (2009), HTN, HLD, recent hospitalization for acute cholecystitis.   -Last CBC on 12/1: Hgb 12, plts 318 -No PTA anticoagulation -Troponin 109, d-dimer 1.53  HL 0.29, just below goal. No issues with infusion  Goal of Therapy:  Heparin level 0.3-0.7 units/ml Monitor platelets by anticoagulation protocol: Yes   Plan:  Give 1000 units bolus x 1 Increase heparin infusion at 900 units/hr Check anti-Xa level in 6 hours and daily while on heparin Continue to monitor H&H and platelets  Elder Cyphers, BS Pharm D, BCPS Clinical Pharmacist 10/01/2023 9:58 AM

## 2023-10-01 NOTE — ED Notes (Signed)
Pt ambulated to restroom independently at this time.  

## 2023-10-01 NOTE — Assessment & Plan Note (Signed)
-   The patient has chest pain with increasing troponin. - Given her significant coronary artery disease history we will continue her on IV heparin. - Will be placed on aspirin and high-dose statin therapy.  As needed IV morphine sulfate and sublingual nitroglycerin will be provided for chest pain. - Cardiology consult will be obtained. - Dr. Hyacinth Meeker was notified and is aware of the patient.

## 2023-10-01 NOTE — Progress Notes (Addendum)
10/01/2023 9:38 AM  Pt was seen on rounds while holding in ED for a bed at [facility].  She is still having dull central nonradiating chest pain relieved with IV morphine.  She reports nausea and has had some emesis.  She was medicated with ondansetron IV and we added prochlorperazine for refractory nausea and vomiting.   Also added a repeat high sensitive troponin as it had not been retested since yesterday evening.  She is persistently bradycardic.  Home meds still not reconciled after over 8 hours.  Sent message to pharm D.   Vitals:   10/01/23 0400 10/01/23 0600  BP: (!) 154/59 (!) 151/70  Pulse: (!) 42 (!) 41  Resp: 12 12  Temp:  97.9 F (36.6 C)  SpO2: 95% 95%   Exam:  Physical Exam Constitutional:      Appearance: She is normal weight.  HENT:     Head: Normocephalic and atraumatic.  Eyes:     Extraocular Movements: Extraocular movements intact.     Pupils: Pupils are equal, round, and reactive to light.  Cardiovascular:     Rate and Rhythm: Normal rate.     Chest Wall: PMI is not displaced.     Pulses: Normal pulses.     Heart sounds: Normal heart sounds, S1 normal and S2 normal.  Abdominal:     General: Abdomen is flat.  Musculoskeletal:        General: No swelling.     Cervical back: Normal range of motion and neck supple. No rigidity.     Right lower leg: No edema.     Left lower leg: No edema.  Skin:    General: Skin is warm and dry.  Neurological:     Mental Status: She is alert.  Psychiatric:        Mood and Affect: Mood normal.        Behavior: Behavior normal.    Impression  Principal Problem:   ACS (acute coronary syndrome) (HCC) Active Problems:   Essential hypertension   Anxiety and depression   GERD without esophagitis   Restless leg syndrome   Chronic obstructive pulmonary disease (COPD) (HCC)  Plan Transfer to [facility] when bed available Continue IV heparin infusion Repeat high sensitive troponin Continuous telemetry monitoring Cardiology consultation  at [facility]  IV morphine for pain IV ondansetron and prochlorperazine for nausea/emesis Working on suboptimally controlled BP with resumption of home BP meds.   Time spent: 38 mins  Devaris Quirk MD  How to contact the [facility] Attending or Consulting provider 7A - 7P or covering provider during after hours 7P -7A, for this patient?  Check the care team in [facility] and look for a) attending/consulting TR[facility] provider listed and b) the [facility] team listed Log into [email or web address] and use [place]'s universal password to access. If you do not have the password, please contact the hospital operator. Locate the [facility] provider you are looking for under [facility] and page to a number that you can be directly reached. If you still have difficulty reaching the provider, please page the [facility] (Director on Call) for the Hospitalists listed on amion for assistance.

## 2023-10-01 NOTE — Progress Notes (Signed)
  Echocardiogram 2D Echocardiogram has been performed.  [name] 10/01/2023, 3:22 PM

## 2023-10-01 NOTE — ED Notes (Signed)
Date and time results received: 10/01/23 1044 (use smartphrase ".now" to insert current time)  Test: Trop Critical Value: 347  Name of Provider Notified: Dr. Laural Benes  Orders Received? Or Actions Taken?: see chart

## 2023-10-01 NOTE — H&P (Signed)
[place]   PATIENT NAME: [name]    MR#:  [id number]  DATE OF BIRTH:  03/19/1966  DATE OF ADMISSION:  09/30/2023  PRIMARY CARE PHYSICIAN: Billie Lade, MD   Patient is coming from: Home  REQUESTING/REFERRING PHYSICIAN:Glick, Onalee Hua, MD  CHIEF COMPLAINT:   Chief Complaint  Patient presents with   Chest Pain    HISTORY OF PRESENT ILLNESS:  TAMME HOMEWOOD is a 57 y.o. female with medical history significant for bipolar 1 disorder, ADD, carotid artery disease, COPD, chronic diastolic CHF, coronary artery disease s/p three-vessel CABG, CVA, depression, fibromyalgia, GERD, hypertension, dyslipidemia and OSA on CPAP, who presented to the ER with acute onset of epigastric and substernal chest pain felt as a crampy pain with radiation to her mid back and moderate in intensity with associated nausea and diaphoresis without vomiting.  She admitted to mild dyspnea and palpitations with it.  She denies any cough or wheezing or hemoptysis.  No leg pain or edema or recent travels or surgeries.  No fever or chills.  No dysuria, oliguria or hematuria or flank pain.  No bleeding diathesis.  ED Course: When the patient came to the ER, BP was 145/67 with heart rate of 46 and otherwise normal vital signs.  Labs revealed unremarkable CMP except for alk phos of 189, AST of 160 and ALT of 89 with serum lipase of 33.  High-sensitivity troponin I was 15 and later 109 and CBC is pending.  D-dimer was 1.53.  EKG as reviewed by me :  EKG showed sinus bradycardia with a rate of 49 with possible left atrial enlargement and incomplete right bundle branch block. Imaging: Two-view chest x-ray showed stable emphysema with no acute process.  The patient was given IV heparin, 4 mg IV morphine sulfate twice and 4 g IV Zofran.  Dr. Hyacinth Meeker was notified about the patient.  She will be admitted to a cardiac telemetry observation bed at [facility] for further evaluation and management. PAST MEDICAL HISTORY:    Past Medical History:  Diagnosis Date   ADD (attention deficit disorder with hyperactivity)    Anxiety    Bipolar 1 disorder (HCC)    Carotid artery disease (HCC)    Right 50-69% stenosis by doppler 2010   Chest pain 04/23/2021   Chronic diastolic (congestive) heart failure (HCC)    COPD (chronic obstructive pulmonary disease) (HCC)    [employer]rees[employer] 12/15/2020   Coronary artery disease    a. s/p CABG in 2009 with LIMA-LAD, SVG-RI, and SVG-RCA b. low-risk NST in 10/2015 c. low-risk NST in 12/2018.   CVA (cerebral infarction) 12/2008   Reports a neurologist told her she had a blood clot in her brain   Depression    Depression    [employer]reesia 12/15/2020   Emphysema of lung (HCC) 06/19/2018   Fibromyalgia    GERD (gastroesophageal reflux disease)    Hyperlipidemia    Hypertension    Myocardial infarction (HCC) 09/18/2011   [employer]reesia 12/15/2020   OSA on CPAP 12/31/2018   Preventative health care 07/16/2022   Sleep apnea    [employer]reesia 12/15/2020   Stroke (HCC)    [employer]ia 12/15/2020   Tobacco abuse    Upper respiratory tract infection 11/09/2021    PAST SURGICAL HISTORY:   Past Surgical History:  Procedure Laterality Date   CARDIAC CATHETERIZATION     CORONARY ARTERY BYPASS GRAFT  2009   3V CABG LIMA --> LAD, SVG --> Ramus Intermediate,  SVG --> RCA  LEFT HEART CATH AND CORS/GRAFTS ANGIOGRAPHY N/A 04/24/2021   Procedure: LEFT HEART CATH AND CORS/GRAFTS ANGIOGRAPHY;  Surgeon: Kathleene Hazel, MD;  Location: [facility] INVASIVE CV LAB;  Service: Cardiovascular;  Laterality: N/A;   TUBAL LIGATION  1990    SOCIAL HISTORY:   Social History   Tobacco Use   Smoking status: Former    Current packs/day: 0.00    Average packs/day: 1 pack/day for 38.0 years (38.0 ttl pk-yrs)    Types: Cigarettes    Start date: 12/14/1981    Quit date: 12/17/2018    Years since quitting: 4.7   Smokeless tobacco: Never  Substance Use Topics   Alcohol use: Not Currently    Comment: rarely- had a  margarita on birthday    FAMILY HISTORY:   Family History  Problem Relation Age of Onset   CAD Mother    Hypertension Mother    Stroke Mother    Heart disease Father    Diabetes type II Father    Intellectual disability Maternal Aunt    Intellectual disability Maternal Uncle    Alcohol abuse Paternal Grandfather    Colon cancer Neg Hx    Breast cancer Neg Hx    Lung cancer Neg Hx     DRUG ALLERGIES:   Allergies  Allergen Reactions   Isosorbide Nitrate Other (See Comments)    Headaches; Worsening Chest Pain    REVIEW OF SYSTEMS:   ROS As per history of present illness. All pertinent systems were reviewed above. Constitutional, HEENT, cardiovascular, respiratory, GI, GU, musculoskeletal, neuro, psychiatric, endocrine, integumentary and hematologic systems were reviewed and are otherwise negative/unremarkable except for positive findings mentioned above in the HPI.   MEDICATIONS AT HOME:   Prior to Admission medications   Medication Sig Start Date End Date Taking? Authorizing Provider  acetaminophen (TYLENOL) 500 MG tablet Take 2 tablets (1,000 mg total) by mouth every 6 (six) hours for 7 days. 09/26/23 10/03/23  Pappayliou, Santina Evans A, DO  albuterol (VENTOLIN HFA) 108 (90 Base) MCG/ACT inhaler Inhale 2 puffs into the lungs every 6 (six) hours as needed for wheezing or shortness of breath. 07/24/23   [provider]  ALPRAZolam (XANAX) 0.5 MG tablet TAKE 1 TABLET BY MOUTH THREE TIMES DAILY AS NEEDED Patient taking differently: Take 0.5 mg by mouth 3 (three) times daily as needed for anxiety. 05/06/23   Mozingo, Thereasa Solo, NP  aspirin EC 81 MG tablet Take 1 tablet (81 mg total) by mouth daily. 09/28/23   Pappayliou, Santina Evans A, DO  budesonide-formoterol (SYMBICORT) 160-4.5 MCG/ACT inhaler 1-2 puffs every 12 hours Patient taking differently: Inhale 2 puffs into the lungs in the morning and at bedtime. 03/10/22   Nyoka Cowden, MD  buPROPion (WELLBUTRIN XL) 150 MG  24 hr tablet TAKE 1 TABLET BY MOUTH DAILY 09/19/23   Mozingo, Thereasa Solo, NP  Cholecalciferol (VITAMIN D3 PO) Take 1 tablet by mouth daily.    [provider]  cyclobenzaprine (FLEXERIL) 10 MG tablet Take 10 mg by mouth 2 (two) times daily as needed for muscle spasms.  Patient not taking: Reported on 09/28/2023    [provider]  diphenoxylate-atropine (LOMOTIL) 2.5-0.025 MG tablet Take 2.5 mg by mouth daily as needed for diarrhea or loose stools. Patient not taking: Reported on 09/28/2023    [provider]  docusate sodium (COLACE) 100 MG capsule Take 1 capsule (100 mg total) by mouth 2 (two) times daily. 09/26/23 09/25/24  Pappayliou, Santina Evans A, DO  esomeprazole (NEXIUM) 40 MG  capsule TAKE ONE CAPSULE BY MOUTH DAILY 07/25/23   Billie Lade, MD  Evolocumab (REPATHA SURECLICK) 140 MG/ML SOAJ Inject 140 mg into the skin every 14 (fourteen) days. 06/15/23   Antoine Poche, MD  FLUoxetine (PROZAC) 40 MG capsule Take 1 capsule (40 mg total) by mouth 2 (two) times daily. 12/30/22   Mozingo, Thereasa Solo, NP  fluticasone (FLONASE) 50 MCG/ACT nasal spray Place 2 sprays into both nostrils daily. 11/09/21   Donell Beers, FNP  furosemide (LASIX) 40 MG tablet Take 1 tablet (40 mg total) by mouth daily. 12/10/22   Antoine Poche, MD  losartan (COZAAR) 25 MG tablet Take 1 tablet (25 mg total) by mouth daily. 06/15/23   Antoine Poche, MD  Multiple Vitamins-Minerals (CENTRUM SILVER PO) Take 1 tablet by mouth daily.    [provider]  nitroGLYCERIN (NITROSTAT) 0.4 MG SL tablet Place 1 tablet (0.4 mg total) under the tongue every 5 (five) minutes x 3 doses as needed for chest pain (if no relief after 3rd dose, proceed to the ED for an evaluation). Patient not taking: Reported on 09/28/2023 06/15/23   Antoine Poche, MD  ondansetron (ZOFRAN) 4 MG tablet Take 1 tablet (4 mg total) by mouth daily as needed for nausea or vomiting. 09/26/23 09/25/24   Pappayliou, Santina Evans A, DO  oxyCODONE (ROXICODONE) 5 MG immediate release tablet Take 1 tablet (5 mg total) by mouth every 6 (six) hours as needed. 09/26/23   Pappayliou, Santina Evans A, DO  rOPINIRole (REQUIP) 1 MG tablet TAKE 1 TABLET BY MOUTH AT BEDTIME AS NEEDED Patient not taking: Reported on 09/28/2023 03/14/23   Billie Lade, MD  vitamin B-12 (CYANOCOBALAMIN) 100 MCG tablet Take 100 mcg by mouth daily.    [provider]  zolpidem (AMBIEN) 10 MG tablet TAKE 1 TABLET BY MOUTH AT BEDTIME Patient taking differently: Take 10 mg by mouth at bedtime as needed for sleep. 05/06/23   Mozingo, Thereasa Solo, NP      VITAL SIGNS:  Blood pressure (!) 154/59, pulse (!) 42, temperature 97.9 F (36.6 C), temperature source Oral, resp. rate 12, height 5\' 2"  (1.575 m), weight 54.8 kg, last menstrual period 10/22/2015, SpO2 95%.  PHYSICAL EXAMINATION:  Physical Exam  GENERAL:  57 y.o.-year-old Caucasian female patient lying in the bed with no acute distress.  EYES: Pupils equal, round, reactive to light and accommodation. No scleral icterus. Extraocular muscles intact.  HEENT: Head atraumatic, normocephalic. Oropharynx and nasopharynx clear.  NECK:  Supple, no jugular venous distention. No thyroid enlargement, no tenderness.  LUNGS: Normal breath sounds bilaterally, no wheezing, rales,rhonchi or crepitation. No use of accessory muscles of respiration.  CARDIOVASCULAR: Regular rate and rhythm, S1, S2 normal. No murmurs, rubs, or gallops.  The patient had lower midsternal chest wall tenderness. ABDOMEN: Soft, nondistended, nontender. Bowel sounds present. No organomegaly or mass.  EXTREMITIES: No pedal edema, cyanosis, or clubbing.  NEUROLOGIC: Cranial nerves II through XII are intact. Muscle strength 5/5 in all extremities. Sensation intact. Gait not checked.  PSYCHIATRIC: The patient is alert and oriented x 3.  Normal affect and good eye contact. SKIN: No obvious rash, lesion, or ulcer.    LABORATORY PANEL:   CBC Recent Labs  Lab 09/25/23 0602  WBC 6.3  HGB 12.0  HCT 38.0  PLT 318   ------------------------------------------------------------------------------------------------------------------  Chemistries  Recent Labs  Lab 09/25/23 0602 09/30/23 2006  NA 138 136  K 3.9 3.8  CL 102 104  CO2 27 22  GLUCOSE 101* 133*  BUN 11 10  CREATININE 0.71 0.76  CALCIUM 9.3 9.2  MG 1.9  --   AST 17 160*  ALT 12 89*  ALKPHOS 76 189*  BILITOT 0.4 0.6   ------------------------------------------------------------------------------------------------------------------  Cardiac Enzymes No results for input(s): "TROPONINI" in the last 168 hours. ------------------------------------------------------------------------------------------------------------------  RADIOLOGY:  CT Angio Chest PE W and/or Wo Contrast  Result Date: 10/01/2023 CLINICAL DATA:  Chest pain and positive D-dimer, history of recent cholecystectomy EXAM: CT ANGIOGRAPHY CHEST WITH CONTRAST TECHNIQUE: Multidetector CT imaging of the chest was performed using the standard protocol during bolus administration of intravenous contrast. Multiplanar CT image reconstructions and MIPs were obtained to evaluate the vascular anatomy. RADIATION DOSE REDUCTION: This exam was performed according to the departmental dose-optimization program which includes automated exposure control, adjustment of the mA and/or kV according to patient size and/or use of iterative reconstruction technique. CONTRAST:  75mL OMNIPAQUE IOHEXOL 350 MG/ML SOLN COMPARISON:  Chest from earlier in the same day. FINDINGS: Cardiovascular: Thoracic aorta demonstrates atherosclerotic calcifications. Changes of prior coronary bypass grafting are noted. No cardiac enlargement is seen. The pulmonary artery shows a normal branching pattern bilaterally. No filling defect to suggest pulmonary embolism is noted. Mediastinum/Nodes: Thoracic inlet is within normal  limits. No hilar or mediastinal adenopathy is noted. The esophagus as visualized is within normal limits. Lungs/Pleura: Lungs are well aerated bilaterally. Diffuse emphysematous changes are noted particularly in the apices. No focal infiltrate or effusion is noted. Upper Abdomen: Visualized upper abdomen is unremarkable. Musculoskeletal: No acute abnormality noted. Review of the MIP images confirms the above findings. IMPRESSION: No evidence of pulmonary emboli. No acute abnormality noted. Aortic Atherosclerosis (ICD10-I70.0) and Emphysema (ICD10-J43.9). Electronically Signed   By: Alcide Clever M.D.   On: 10/01/2023 00:01   DG Chest 2 View  Result Date: 09/30/2023 CLINICAL DATA:  Chest pain, recent cholecystectomy EXAM: CHEST - 2 VIEW COMPARISON:  09/24/2023 FINDINGS: Frontal and lateral views of the chest demonstrate postsurgical changes from CABG. Cardiac silhouette is unremarkable. No airspace disease, effusion, or pneumothorax. Stable emphysema. No acute bony abnormalities. IMPRESSION: 1. Stable emphysema.  No acute process. Electronically Signed   By: Sharlet Salina M.D.   On: 09/30/2023 20:50      IMPRESSION AND PLAN:  Assessment and Plan: * ACS (acute coronary syndrome) (HCC) - The patient has chest pain with increasing troponin. - Given her significant coronary artery disease history we will continue her on IV heparin. - Will be placed on aspirin and high-dose statin therapy.  As needed IV morphine sulfate and sublingual nitroglycerin will be provided for chest pain. - Cardiology consult will be obtained. - Dr. Hyacinth Meeker was notified and is aware of the patient.  Chronic obstructive pulmonary disease (COPD) (HCC) - We will continue his albuterol and hold off long-acting beta agonist.  Restless leg syndrome - We will continue ropinirole.  GERD without esophagitis Will continue PPI therapy.  Anxiety and depression - We will continue between XL and as needed Xanax.  Essential  hypertension - We will continue antihypertensive therapy.   DVT prophylaxis: IV heparin. Advanced Care Planning:  Code Status: full code.  Family Communication:  The plan of care was discussed in details with the patient (and family). I answered all questions. The patient agreed to proceed with the above mentioned plan. Further management will depend upon hospital course. Disposition Plan: Back to previous home environment Consults called: Cardiology All the records are reviewed and case discussed with ED provider.  Status is:  Observation   I certify that at the time of admission, it is my clinical judgment that the patient will require hospital care extending less than 2 midnights.                            Dispo: The patient is from: Home              Anticipated d/c is to: Home              Patient currently is not medically stable to d/c.              Difficult to place patient: No  Hannah Beat M.D on 10/01/2023 at 5:44 AM  [facility]   From 7 PM-7 AM, contact night-coverage [email or web address]  CC: Primary care physician; Billie Lade, MD

## 2023-10-01 NOTE — Assessment & Plan Note (Signed)
-   We will continue between XL and as needed Xanax.

## 2023-10-01 NOTE — ED Notes (Signed)
Pt is pain free Ambulated to restroom without difficulty

## 2023-10-01 NOTE — Consult Note (Signed)
Cardiology Consultation   Patient ID: [name] MRN: [id number]; DOB: January 04, 1966  Admit date: 09/30/2023 Date of Consult: 10/01/2023  PCP:  Billie Lade, MD   [facility] Providers Cardiologist:  Dina Rich, MD    Patient Profile:   [name] is a 57 y.o. female with a hx of CAD, chronic diastolic heart failure, HTN, HLD, COPD, anxiety, bipolar disorder, history of CVA, fibromyalgia, OSA who is being seen 10/01/2023 for the evaluation of NSTEMI at the request of Dr. Laury Deep  History of Present Illness:   [name] is a 57 year old female with above medical history who is followed by Dr. Wyline Mood.  Per chart review, patient previously underwent CABG in 2009 with LIMA-LAD, SVG-RI, SVG-RCA.  Later in 04/2021, patient was admitted with chest pain.  Underwent echocardiogram on 04/24/2021 that showed EF 65-70%, no regional wall motion abnormalities, normal LV diastolic parameters, normal RV function.  Underwent cardiac catheterization on 04/24/2021 that showed no obstructive disease in the LAD, circumflex, or intermediate branch.  The graft to the intermediate is chronically occluded but the native vessel had no disease.  The LIMA-mid LAD was atretic, but native LAD had no obstructive disease. It was noted that the ruptured plaque noted in the proximal LAD in 2009 may have been SCAD.  There is chronic occlusion of the proximal RCA, SVG-distal RCA was patent.  Patient was last seen by Dr. Wyline Mood on 06/15/2023.  At that time, patient complained of palpitations but was otherwise doing well.  Denies shortness of breath, lower extremity edema, chest pain.  For evaluation of palpitations, patient wore a cardiac monitor that was resulted on 06/30/2023 and showed rare PACs, occasional PVCs (3.5% burden).  Triggered events correlated with sinus rhythm, PVCs.  Patient was recently admitted from 11/30 - 12/3 with acute cholecystitis.  She underwent robotic assisted laparoscopic  cholecystectomy on 12/2.  Prior to her discharge on 12/30, her postop pain was well-controlled and she was tolerating diet.  She was euvolemic on exam, Lasix was held temporarily perioperatively.  Patient presented to the ED at [facility] on 12/6 complaining of epigastric chest discomfort.  Pain was described as a pressure/tight feeling, radiated to her back.  Pain was not worsened by walking or breathing.  High-sensitivity troponin 15>109>347.  NA 136, K 3.8, creatinine 0.76.  D-dimer elevated to 1.53.  CTA chest showed no evidence of PE, no acute abnormalities noted. EKG showed sinus bradycardia, heart rate 49 bpm, incomplete RBBB.  Patient was admitted to the hospitalist service and transferred to [facility] for treatment of suspected NSTEMI. Started on IV heparin.   On interview, patient reports that she had her cholecystectomy on 12/2.  Since that time, she has not been eating very much but has overall been feeling well.  Yesterday afternoon, she started to feel a squeezing feeling in her chest and back. Pain continued to worsen throughout the afternoon, so she decided go to the ER yesterday afternoon.  Pain did not worsen or improve with exertion, position changes.  Not worsened by deep inspiration. She cannot remember what her chest pain felt like prior to CABG in the past. She denies pain near her surgical incisions.  When she had chest pain, she felt a bit short of breath. Pain was relieved when she was given morphine in the ED. She is currently chest pain-free and her breathing feels well.  Denies palpitations, ankle edema.   Past Medical History:  Diagnosis Date   ADD (attention deficit disorder  with hyperactivity)    Anxiety    Bipolar 1 disorder (HCC)    Carotid artery disease (HCC)    Right 50-69% stenosis by doppler 2010   Chest pain 04/23/2021   Chronic diastolic (congestive) heart failure (HCC)    COPD (chronic obstructive pulmonary disease) (HCC)    Phreesia 12/15/2020   Coronary  artery disease    a. s/p CABG in 2009 with LIMA-LAD, SVG-RI, and SVG-RCA b. low-risk NST in 10/2015 c. low-risk NST in 12/2018.   CVA (cerebral infarction) 12/2008   Reports a neurologist told her she had a blood clot in her brain   Depression    Depression    Phreesia 12/15/2020   Emphysema of lung (HCC) 06/19/2018   Fibromyalgia    GERD (gastroesophageal reflux disease)    Hyperlipidemia    Hypertension    Myocardial infarction (HCC) 09/18/2011   Phreesia 12/15/2020   OSA on CPAP 12/31/2018   Preventative health care 07/16/2022   Sleep apnea    Phreesia 12/15/2020   Stroke (HCC)    Phreesia 12/15/2020   Tobacco abuse    Upper respiratory tract infection 11/09/2021    Past Surgical History:  Procedure Laterality Date   CARDIAC CATHETERIZATION     CORONARY ARTERY BYPASS GRAFT  2009   3V CABG LIMA --> LAD, SVG --> Ramus Intermediate,  SVG --> RCA   LEFT HEART CATH AND CORS/GRAFTS ANGIOGRAPHY N/A 04/24/2021   Procedure: LEFT HEART CATH AND CORS/GRAFTS ANGIOGRAPHY;  Surgeon: Kathleene Hazel, MD;  Location: [facility] INVASIVE CV LAB;  Service: Cardiovascular;  Laterality: N/A;   TUBAL LIGATION  1990     Home Medications:  Prior to Admission medications   Medication Sig Start Date End Date Taking? Authorizing Provider  acetaminophen (TYLENOL) 500 MG tablet Take 2 tablets (1,000 mg total) by mouth every 6 (six) hours for 7 days. 09/26/23 10/03/23 Yes Pappayliou, Santina Evans A, DO  albuterol (VENTOLIN HFA) 108 (90 Base) MCG/ACT inhaler Inhale 2 puffs into the lungs every 6 (six) hours as needed for wheezing or shortness of breath. 07/24/23  Yes [provider]  ALPRAZolam (XANAX) 0.5 MG tablet TAKE 1 TABLET BY MOUTH THREE TIMES DAILY AS NEEDED Patient taking differently: Take 0.5 mg by mouth 3 (three) times daily as needed for anxiety. 05/06/23  Yes Mozingo, Thereasa Solo, NP  aspirin EC 81 MG tablet Take 1 tablet (81 mg total) by mouth daily. 09/28/23  Yes Pappayliou, Santina Evans  A, DO  buPROPion (WELLBUTRIN XL) 150 MG 24 hr tablet TAKE 1 TABLET BY MOUTH DAILY 09/19/23  Yes Mozingo, Thereasa Solo, NP  Cholecalciferol (VITAMIN D3 PO) Take 1 tablet by mouth daily.   Yes [provider]  esomeprazole (NEXIUM) 40 MG capsule TAKE ONE CAPSULE BY MOUTH DAILY 07/25/23  Yes Billie Lade, MD  Evolocumab (REPATHA SURECLICK) 140 MG/ML SOAJ Inject 140 mg into the skin every 14 (fourteen) days. 06/15/23  Yes BranchDorothe Pea, MD  FLUoxetine (PROZAC) 40 MG capsule Take 1 capsule (40 mg total) by mouth 2 (two) times daily. Patient taking differently: Take 80 mg by mouth at bedtime. 12/30/22  Yes Mozingo, Thereasa Solo, NP  fluticasone (FLONASE) 50 MCG/ACT nasal spray Place 2 sprays into both nostrils daily. Patient taking differently: Place 2 sprays into both nostrils every morning. 11/09/21  Yes Paseda, Baird Kay, FNP  furosemide (LASIX) 40 MG tablet Take 1 tablet (40 mg total) by mouth daily. Patient taking differently: Take 40 mg by mouth daily as needed for fluid.  12/10/22  Yes Branch, Dorothe Pea, MD  losartan (COZAAR) 25 MG tablet Take 1 tablet (25 mg total) by mouth daily. 06/15/23  Yes BranchDorothe Pea, MD  Multiple Vitamins-Minerals (CENTRUM SILVER PO) Take 1 tablet by mouth daily.   Yes [provider]  ondansetron (ZOFRAN) 4 MG tablet Take 1 tablet (4 mg total) by mouth daily as needed for nausea or vomiting. 09/26/23 09/25/24 Yes Pappayliou, Santina Evans A, DO  oxyCODONE (ROXICODONE) 5 MG immediate release tablet Take 1 tablet (5 mg total) by mouth every 6 (six) hours as needed. Patient taking differently: Take 5 mg by mouth every 6 (six) hours as needed for moderate pain (pain score 4-6) or severe pain (pain score 7-10). 09/26/23  Yes Pappayliou, Santina Evans A, DO  vitamin B-12 (CYANOCOBALAMIN) 100 MCG tablet Take 100 mcg by mouth daily.   Yes [provider]  zolpidem (AMBIEN) 10 MG tablet TAKE 1 TABLET BY MOUTH AT BEDTIME 05/06/23  Yes Mozingo, Thereasa Solo, NP    Inpatient Medications: Scheduled Meds:  aspirin  324 mg Oral NOW   Or   aspirin  300 mg Rectal NOW   aspirin EC  325 mg Oral Daily   atorvastatin  80 mg Oral Daily   buPROPion  150 mg Oral Daily   cholecalciferol  1,000 Units Oral Daily   docusate sodium  100 mg Oral BID   FLUoxetine  40 mg Oral BID   fluticasone  2 spray Each Nare Daily   furosemide  40 mg Oral Daily   losartan  25 mg Oral Daily   multivitamin with minerals   Oral Daily   pantoprazole  40 mg Oral Daily   cyanocobalamin  100 mcg Oral Daily   Continuous Infusions:  sodium chloride 100 mL/hr at 10/01/23 1338   heparin 800 Units/hr (10/01/23 1037)   PRN Meds: acetaminophen, albuterol, ALPRAZolam, morphine injection, nitroGLYCERIN, ondansetron (ZOFRAN) IV, oxyCODONE, perflutren lipid microspheres (DEFINITY) IV suspension, prochlorperazine, rOPINIRole, zolpidem  Allergies:    Allergies  Allergen Reactions   Isosorbide Nitrate Other (See Comments)    Headaches; Worsening Chest Pain    Social History:   Social History   Socioeconomic History   Marital status: Married    Spouse name: Not on file   Number of children: 2   Years of education: Not on file   Highest education level: Not on file  Occupational History   Occupation: [employer]    Comment: Investment banker, operational- in Set designer  Tobacco Use   Smoking status: Former    Current packs/day: 0.00    Average packs/day: 1 pack/day for 38.0 years (38.0 ttl pk-yrs)    Types: Cigarettes    Start date: 12/14/1981    Quit date: 12/17/2018    Years since quitting: 4.7   Smokeless tobacco: Never  Vaping Use   Vaping status: Never Used  Substance and Sexual Activity   Alcohol use: Not Currently    Comment: rarely- had a margarita on birthday   Drug use: No   Sexual activity: Yes    Birth control/protection: Post-menopausal  Other Topics Concern   Not on file  Social History Narrative   Married with 2 children who are grown; has  pets   Social Determinants of Health   Financial Resource Strain: Not on file  Food Insecurity: No Food Insecurity (09/28/2023)   Hunger Vital Sign    Worried About Running Out of Food in the Last Year: Never true    Ran Out of Food  in the Last Year: Never true  Transportation Needs: No Transportation Needs (09/28/2023)   PRAPARE - Administrator, Civil Service (Medical): No    Lack of Transportation (Non-Medical): No  Physical Activity: Not on file  Stress: Not on file  Social Connections: Not on file  Intimate Partner Violence: Not At Risk (09/28/2023)   Humiliation, Afraid, Rape, and Kick questionnaire    Fear of Current or Ex-Partner: No    Emotionally Abused: No    Physically Abused: No    Sexually Abused: No    Family History:    Family History  Problem Relation Age of Onset   CAD Mother    Hypertension Mother    Stroke Mother    Heart disease Father    Diabetes type II Father    Intellectual disability Maternal Aunt    Intellectual disability Maternal Uncle    Alcohol abuse Paternal Grandfather    Colon cancer Neg Hx    Breast cancer Neg Hx    Lung cancer Neg Hx      ROS:  Please see the history of present illness.   All other ROS reviewed and negative.     Physical Exam/Data:   Vitals:   10/01/23 1338 10/01/23 1400 10/01/23 1500 10/01/23 1601  BP:  (!) 150/69 135/65 (!) 142/67  Pulse:  (!) 45 (!) 50 (!) 54  Resp:  14 14 18   Temp: 97.8 F (36.6 C)   98.2 F (36.8 C)  TempSrc: Oral   Oral  SpO2:  95% 97% 94%  Weight:      Height:        Intake/Output Summary (Last 24 hours) at 10/01/2023 1648 Last data filed at 10/01/2023 1037 Gross per 24 hour  Intake 105.76 ml  Output --  Net 105.76 ml      09/30/2023    8:03 PM 09/26/2023   12:52 PM 09/25/2023    1:44 AM  Last 3 Weights  Weight (lbs) 120 lb 13 oz 120 lb 13 oz 121 lb  Weight (kg) 54.8 kg 54.8 kg 54.885 kg     Body mass index is 22.1 kg/m.  General:  Well nourished, well  developed, in no acute distress. Laying comfortably in the bed with head elevated  HEENT: normal Neck: no JVD Vascular: Radial pulses 2+ bilaterally Cardiac:  normal S1, S2; RRR; grade 2/6 systolic murmur  Lungs:  clear to auscultation bilaterally, no wheezing, rhonchi or rales. Normal work of breathing on room air  Abd: soft. Laparoscopic incisions healing normally  Ext: no edema in BLE  Musculoskeletal:  No deformities, BUE and BLE strength normal and equal Skin: warm and dry  Neuro:  CNs 2-12 intact, no focal abnormalities noted Psych:  Normal affect   EKG:  The EKG was personally reviewed and demonstrates:  Sinus bradycardia, heart rate 49 bpm, incomplete RBBB  Relevant CV Studies: Cardiac Studies & Procedures   CARDIAC CATHETERIZATION  CARDIAC CATHETERIZATION 04/24/2021  Narrative  Prox RCA to Mid RCA lesion is 100% stenosed.  LIMA graft was visualized by angiography.  SVG graft was visualized by angiography.  Origin to Prox Graft lesion is 100% stenosed.  1. No obstructive disease in the LAD, Circumflex or intermediate branch. The graft to the intermediate is chronically occluded as the native vessel has no disease. The LIMA to the mid LAD is atretic as the native LAD has no obstructive disease. I suspect that the ruptured plaque noted in the proximal LAD in 2009 may  have been SCAD that has now healed. 2. Chronic occlusion proximal RCA. The vein graft to the distal RCA is patent 3. Normal LV filling pressures  Recommendations: No further ischemic workup. Continue medical management of CAD  Findings Coronary Findings Diagnostic  Dominance: Right  Left Circumflex Vessel is large.  First Obtuse Marginal Branch Vessel is moderate in size.  Right Coronary Artery Vessel is large. Prox RCA to Mid RCA lesion is 100% stenosed. The lesion is chronically occluded.  Graft To Dist RCA  Saphenous Graft To 1st Mrg SVG graft was visualized by angiography. Origin to Prox  Graft lesion is 100% stenosed. The lesion is chronically occluded.  LIMA LIMA Graft To Mid LAD LIMA graft was visualized by angiography. The graft is atretic.  Intervention  No interventions have been documented.   STRESS TESTS  NM MYOCAR MULTI W/SPECT W 01/09/2019  Narrative  Blood pressure demonstrated a hypertensive response to exercise.  Horizontal ST segment depression ST segment depression of 0.5 mm was noted during recovery in the II, III, aVF, V5 and V6 leads. The patient was asymptomatic.  The study is normal. No perfusion defects consistent with myocardial ischemia or scar.  This is a low risk study.  Nuclear stress EF: 71%.   ECHOCARDIOGRAM  ECHOCARDIOGRAM COMPLETE 04/24/2021  Narrative ECHOCARDIOGRAM REPORT    Patient Name:   UMAIMA GEHMAN Date of Exam: 04/24/2021 Medical Rec #:  [id number]        Height:       62.0 in Accession #:    [id number]       Weight:       145.0 lb Date of Birth:  11-May-1966        BSA:          1.667 m Patient Age:    55 years         BP:           165/60 mmHg Patient Gender: F                HR:           49 bpm. [facility]  Procedure: 2D Echo, Cardiac Doppler and Color Doppler  Indications:    R07.9* Chest pain, unspecified  History:        Patient has prior history of Echocardiogram examinations, most recent 01/01/2019. CHF, CAD, Stroke, Arrythmias:LBBB, Signs/Symptoms:Chest Pain; Risk Factors:Hypertension and Current Smoker. CABG X3.  Sonographer:    Mikki Harbor Referring Phys: [phone] Heloise Beecham EMOKPAE  IMPRESSIONS   1. Left ventricular ejection fraction, by estimation, is 65 to 70%. The left ventricle has normal function. The left ventricle has no regional wall motion abnormalities. Left ventricular diastolic parameters were normal. 2. Right ventricular systolic function is normal. The right ventricular size is mildly enlarged. Tricuspid regurgitation signal is inadequate for assessing PA  pressure. 3. The mitral valve is grossly normal. Trivial mitral valve regurgitation. 4. The aortic valve is tricuspid. Aortic valve regurgitation is not visualized. Aortic valve mean gradient measures 4.0 mmHg. 5. The inferior vena cava is normal in size with greater than 50% respiratory variability, suggesting right atrial pressure of 3 mmHg.  FINDINGS Left Ventricle: Left ventricular ejection fraction, by estimation, is 65 to 70%. The left ventricle has normal function. The left ventricle has no regional wall motion abnormalities. The left ventricular internal cavity size was normal in size. There is no left ventricular hypertrophy. Left ventricular diastolic parameters were normal.  Right Ventricle: The right  ventricular size is mildly enlarged. No increase in right ventricular wall thickness. Right ventricular systolic function is normal. Tricuspid regurgitation signal is inadequate for assessing PA pressure.  Left Atrium: Left atrial size was normal in size.  Right Atrium: Right atrial size was normal in size.  Pericardium: There is no evidence of pericardial effusion.  Mitral Valve: The mitral valve is grossly normal. Trivial mitral valve regurgitation. MV peak gradient, 4.4 mmHg. The mean mitral valve gradient is 1.0 mmHg.  Tricuspid Valve: The tricuspid valve is grossly normal. Tricuspid valve regurgitation is trivial.  Aortic Valve: The aortic valve is tricuspid. Aortic valve regurgitation is not visualized. Aortic valve mean gradient measures 4.0 mmHg. Aortic valve peak gradient measures 9.7 mmHg. Aortic valve area, by VTI measures 2.26 cm.  Pulmonic Valve: The pulmonic valve was grossly normal. Pulmonic valve regurgitation is trivial.  Aorta: The aortic root is normal in size and structure.  Venous: The inferior vena cava is normal in size with greater than 50% respiratory variability, suggesting right atrial pressure of 3 mmHg.  IAS/Shunts: No atrial level shunt detected by  color flow Doppler.   LEFT VENTRICLE PLAX 2D LVIDd:         4.44 cm  Diastology LVIDs:         2.77 cm  LV e' medial:    9.11 cm/s LV PW:         0.72 cm  LV E/e' medial:  12.3 LV IVS:        0.66 cm  LV e' lateral:   14.10 cm/s LVOT diam:     1.80 cm  LV E/e' lateral: 7.9 LV SV:         88 LV SV Index:   53 LVOT Area:     2.54 cm   RIGHT VENTRICLE RV Basal diam:  2.86 cm RV Mid diam:    2.91 cm  LEFT ATRIUM             Index       RIGHT ATRIUM           Index LA diam:        3.70 cm 2.22 cm/m  RA Area:     11.80 cm LA Vol (A2C):   36.0 ml 21.59 ml/m RA Volume:   24.80 ml  14.87 ml/m LA Vol (A4C):   25.9 ml 15.53 ml/m LA Biplane Vol: 31.7 ml 19.01 ml/m AORTIC VALVE AV Area (Vmax):    2.54 cm AV Area (Vmean):   2.56 cm AV Area (VTI):     2.26 cm AV Vmax:           156.00 cm/s AV Vmean:          88.200 cm/s AV VTI:            0.390 m AV Peak Grad:      9.7 mmHg AV Mean Grad:      4.0 mmHg LVOT Vmax:         156.00 cm/s LVOT Vmean:        88.900 cm/s LVOT VTI:          0.347 m LVOT/AV VTI ratio: 0.89  AORTA Ao Root diam: 2.60 cm  MITRAL VALVE MV Area (PHT): 2.73 cm     SHUNTS MV Area VTI:   2.01 cm     Systemic VTI:  0.35 m MV Peak grad:  4.4 mmHg     Systemic Diam: 1.80 cm MV Mean grad:  1.0 mmHg MV  Vmax:       1.05 m/s MV Vmean:      48.4 cm/s MV Decel Time: 278 msec MV E velocity: 112.00 cm/s MV A velocity: 79.30 cm/s MV E/A ratio:  1.41  Nona Dell MD Electronically signed by Nona Dell MD Signature Date/Time: 04/24/2021/11:44:26 AM    Final    MONITORS  LONG TERM MONITOR (3-14 DAYS) 06/30/2023  Narrative   6 day monitor   Rare supraventricular ectopy in the form of isolated PACs, couplets   Occasional ventricular ectopy in the form of isolated PVCs (3.5%), rare couplets   There are patient triggered events but no symptoms reported. Triggered events correlated with sinus rhythm, PVCs   Patch Wear Time:  5 days and 23 hours  (2024-08-24T08:03:35-0400 to 2024-08-30T07:34:43-0400)  Patient had a min HR of 39 bpm, max HR of 99 bpm, and avg HR of 58 bpm. Predominant underlying rhythm was Sinus Rhythm. Isolated SVEs were rare (<1.0%), SVE Couplets were rare (<1.0%), and no SVE Triplets were present. Isolated VEs were occasional (3.5%, 17046), VE Couplets were rare (<1.0%, 163), and no VE Triplets were present. Ventricular Trigeminy was present.            Laboratory Data:  High Sensitivity Troponin:   Recent Labs  Lab 09/24/23 2045 09/24/23 2241 09/30/23 2006 09/30/23 2151 10/01/23 0800  TROPONINIHS 4 4 15  109* 347*     Chemistry Recent Labs  Lab 09/24/23 2045 09/25/23 0602 09/30/23 2006  NA 134* 138 136  K 3.6 3.9 3.8  CL 102 102 104  CO2 21* 27 22  GLUCOSE 103* 101* 133*  BUN 13 11 10   CREATININE 0.71 0.71 0.76  CALCIUM 9.4 9.3 9.2  MG 1.8 1.9  --   GFRNONAA >60 >60 >60  ANIONGAP 11 9 10     Recent Labs  Lab 09/24/23 2045 09/25/23 0602 09/30/23 2006  PROT 7.7 6.8 6.9  ALBUMIN 4.1 3.6 3.6  AST 19 17 160*  ALT 14 12 89*  ALKPHOS 88 76 189*  BILITOT 0.3 0.4 0.6   Lipids No results for input(s): "CHOL", "TRIG", "HDL", "LABVLDL", "LDLCALC", "CHOLHDL" in the last 168 hours.  Hematology Recent Labs  Lab 09/24/23 2045 09/25/23 0602 10/01/23 0800  WBC 5.7 6.3 4.9  RBC 4.46 4.15 4.09  HGB 13.1 12.0 12.0  HCT 39.9 38.0 38.1  MCV 89.5 91.6 93.2  MCH 29.4 28.9 29.3  MCHC 32.8 31.6 31.5  RDW 12.8 13.0 13.2  PLT 361 318 310   Thyroid No results for input(s): "TSH", "FREET4" in the last 168 hours.  BNPNo results for input(s): "BNP", "PROBNP" in the last 168 hours.  DDimer  Recent Labs  Lab 09/30/23 2005  DDIMER 1.53*     Radiology/Studies:  CT Angio Chest PE W and/or Wo Contrast  Result Date: 10/01/2023 CLINICAL DATA:  Chest pain and positive D-dimer, history of recent cholecystectomy EXAM: CT ANGIOGRAPHY CHEST WITH CONTRAST TECHNIQUE: Multidetector CT imaging of the chest was  performed using the standard protocol during bolus administration of intravenous contrast. Multiplanar CT image reconstructions and MIPs were obtained to evaluate the vascular anatomy. RADIATION DOSE REDUCTION: This exam was performed according to the departmental dose-optimization program which includes automated exposure control, adjustment of the mA and/or kV according to patient size and/or use of iterative reconstruction technique. CONTRAST:  75mL OMNIPAQUE IOHEXOL 350 MG/ML SOLN COMPARISON:  Chest from earlier in the same day. FINDINGS: Cardiovascular: Thoracic aorta demonstrates atherosclerotic calcifications. Changes of prior coronary bypass grafting are noted.  No cardiac enlargement is seen. The pulmonary artery shows a normal branching pattern bilaterally. No filling defect to suggest pulmonary embolism is noted. Mediastinum/Nodes: Thoracic inlet is within normal limits. No hilar or mediastinal adenopathy is noted. The esophagus as visualized is within normal limits. Lungs/Pleura: Lungs are well aerated bilaterally. Diffuse emphysematous changes are noted particularly in the apices. No focal infiltrate or effusion is noted. Upper Abdomen: Visualized upper abdomen is unremarkable. Musculoskeletal: No acute abnormality noted. Review of the MIP images confirms the above findings. IMPRESSION: No evidence of pulmonary emboli. No acute abnormality noted. Aortic Atherosclerosis (ICD10-I70.0) and Emphysema (ICD10-J43.9). Electronically Signed   By: Alcide Clever M.D.   On: 10/01/2023 00:01   DG Chest 2 View  Result Date: 09/30/2023 CLINICAL DATA:  Chest pain, recent cholecystectomy EXAM: CHEST - 2 VIEW COMPARISON:  09/24/2023 FINDINGS: Frontal and lateral views of the chest demonstrate postsurgical changes from CABG. Cardiac silhouette is unremarkable. No airspace disease, effusion, or pneumothorax. Stable emphysema. No acute bony abnormalities. IMPRESSION: 1. Stable emphysema.  No acute process. Electronically  Signed   By: Sharlet Salina M.D.   On: 09/30/2023 20:50     Assessment and Plan:   NSTEMI  CAD  -Patient previously underwent CABG in 2009 with LIMA-LAD, SVG-RI, SVG-RCA -More recently, patient underwent cardiac catheterization 04/2021.  LIMA-LAD was atretic, no obstructive disease in the native LAD.  Possible previous proximal LAD obstruction in 2009 was due to scad.  Proximal RCA was chronically occluded, SVG-distal RCA was patent.  SVG-RI chronically occluded, native vessel had no disease. -Now, patient presented complaining of a squeezing pain in her chest, radiated to her back.  High-sensitivity troponin 15, 109, 347.  Patient currently chest pain-free -EKG nonischemic -Presentation concerning for NSTEMI.  Agree with IV heparin.  Note patient recently had lap chole on 12/2.  Currently, no evidence of bleeding.  Hemoglobin stable at 12.0 -Plan for left heart catheterization on Monday -Ordered echocardiogram -Continue aspirin 81 mg daily -Continue Lipitor 80 mg daily -Historically, patient not on beta-blocker due to bradycardia  HTN  -Continue losartan 25 mg daily  Chronic diastolic heart failure -Most recent echocardiogram from 04/2021 showed EF 65-70%, no regional wall motion abnormalities, normal RV function -Echocardiogram completed prior to patient's transfer from [facility].  Formal read pending -Patient euvolemic on exam, if not a bit dry.  Reports poor p.o. intake.  Hold Lasix for now  Otherwise per primary  - COPD - Restless leg syndrome  - GERD - Anxiety and depression    Risk Assessment/Risk Scores:    TIMI Risk Score for Unstable Angina or Non-ST Elevation MI:   The patient's TIMI risk score is 3, which indicates a 13% risk of all cause mortality, new or recurrent myocardial infarction or need for urgent revascularization in the next 14 days.{    For questions or updates, please contact [facility] Please consult [email or web address] for contact info under     Signed, Jonita Albee, PA-C  10/01/2023 4:48 PM

## 2023-10-01 NOTE — Assessment & Plan Note (Signed)
-   We will continue his albuterol and hold off long-acting beta agonist.

## 2023-10-01 NOTE — ED Notes (Signed)
[facility] called for transport. 

## 2023-10-01 NOTE — Assessment & Plan Note (Signed)
-   We will continue anti-hypertensive therapy. 

## 2023-10-01 NOTE — Assessment & Plan Note (Signed)
-   Will continue PPI therapy. 

## 2023-10-01 NOTE — Progress Notes (Signed)
   10/01/23 1416  TOC Brief Assessment  Insurance and Status Reviewed  Patient has primary care physician Yes  Home environment has been reviewed From home  Prior level of function: Independent  Prior/Current Home Services No current home services  Social Determinants of Health Reivew SDOH reviewed no interventions necessary  Readmission risk has been reviewed Yes  Transition of care needs no transition of care needs at this time     Transition of Care Department [facility]) has reviewed patient and no TOC needs have been identified at this time. We will continue to monitor patient advancement through interdisciplinary progression rounds. If new patient transition needs arise, please place a TOC consult.

## 2023-10-01 NOTE — Assessment & Plan Note (Signed)
-   We will continue ropinirole. 

## 2023-10-02 DIAGNOSIS — I5032 Chronic diastolic (congestive) heart failure: Secondary | ICD-10-CM | POA: Diagnosis present

## 2023-10-02 DIAGNOSIS — I2510 Atherosclerotic heart disease of native coronary artery without angina pectoris: Secondary | ICD-10-CM | POA: Diagnosis not present

## 2023-10-02 DIAGNOSIS — Z79899 Other long term (current) drug therapy: Secondary | ICD-10-CM | POA: Diagnosis not present

## 2023-10-02 DIAGNOSIS — Z833 Family history of diabetes mellitus: Secondary | ICD-10-CM | POA: Diagnosis not present

## 2023-10-02 DIAGNOSIS — G4700 Insomnia, unspecified: Secondary | ICD-10-CM | POA: Diagnosis present

## 2023-10-02 DIAGNOSIS — K219 Gastro-esophageal reflux disease without esophagitis: Secondary | ICD-10-CM | POA: Diagnosis not present

## 2023-10-02 DIAGNOSIS — R1011 Right upper quadrant pain: Secondary | ICD-10-CM | POA: Diagnosis present

## 2023-10-02 DIAGNOSIS — F319 Bipolar disorder, unspecified: Secondary | ICD-10-CM | POA: Diagnosis present

## 2023-10-02 DIAGNOSIS — Z8673 Personal history of transient ischemic attack (TIA), and cerebral infarction without residual deficits: Secondary | ICD-10-CM | POA: Diagnosis not present

## 2023-10-02 DIAGNOSIS — Z823 Family history of stroke: Secondary | ICD-10-CM | POA: Diagnosis not present

## 2023-10-02 DIAGNOSIS — I214 Non-ST elevation (NSTEMI) myocardial infarction: Secondary | ICD-10-CM | POA: Diagnosis not present

## 2023-10-02 DIAGNOSIS — M797 Fibromyalgia: Secondary | ICD-10-CM | POA: Diagnosis present

## 2023-10-02 DIAGNOSIS — I25810 Atherosclerosis of coronary artery bypass graft(s) without angina pectoris: Secondary | ICD-10-CM | POA: Diagnosis not present

## 2023-10-02 DIAGNOSIS — J449 Chronic obstructive pulmonary disease, unspecified: Secondary | ICD-10-CM | POA: Diagnosis not present

## 2023-10-02 DIAGNOSIS — I110 Hypertensive heart disease with heart failure: Secondary | ICD-10-CM | POA: Diagnosis not present

## 2023-10-02 DIAGNOSIS — Z87891 Personal history of nicotine dependence: Secondary | ICD-10-CM | POA: Diagnosis not present

## 2023-10-02 DIAGNOSIS — G2581 Restless legs syndrome: Secondary | ICD-10-CM

## 2023-10-02 DIAGNOSIS — E785 Hyperlipidemia, unspecified: Secondary | ICD-10-CM | POA: Diagnosis present

## 2023-10-02 DIAGNOSIS — G4733 Obstructive sleep apnea (adult) (pediatric): Secondary | ICD-10-CM | POA: Diagnosis present

## 2023-10-02 DIAGNOSIS — Z8249 Family history of ischemic heart disease and other diseases of the circulatory system: Secondary | ICD-10-CM | POA: Diagnosis not present

## 2023-10-02 DIAGNOSIS — I252 Old myocardial infarction: Secondary | ICD-10-CM | POA: Diagnosis not present

## 2023-10-02 DIAGNOSIS — F419 Anxiety disorder, unspecified: Secondary | ICD-10-CM | POA: Diagnosis not present

## 2023-10-02 DIAGNOSIS — I249 Acute ischemic heart disease, unspecified: Secondary | ICD-10-CM | POA: Diagnosis not present

## 2023-10-02 DIAGNOSIS — Z7982 Long term (current) use of aspirin: Secondary | ICD-10-CM | POA: Diagnosis not present

## 2023-10-02 DIAGNOSIS — I4510 Unspecified right bundle-branch block: Secondary | ICD-10-CM | POA: Diagnosis not present

## 2023-10-02 DIAGNOSIS — J439 Emphysema, unspecified: Secondary | ICD-10-CM | POA: Diagnosis present

## 2023-10-02 LAB — CBC
HCT: 37.7 % (ref 36.0–46.0)
Hemoglobin: 12.1 g/dL (ref 12.0–15.0)
MCH: 28.9 pg (ref 26.0–34.0)
MCHC: 32.1 g/dL (ref 30.0–36.0)
MCV: 90.2 fL (ref 80.0–100.0)
Platelets: 383 10*3/uL (ref 150–400)
RBC: 4.18 MIL/uL (ref 3.87–5.11)
RDW: 13.5 % (ref 11.5–15.5)
WBC: 4.6 10*3/uL (ref 4.0–10.5)
nRBC: 0 % (ref 0.0–0.2)

## 2023-10-02 LAB — HEPARIN LEVEL (UNFRACTIONATED): Heparin Unfractionated: 0.42 [IU]/mL (ref 0.30–0.70)

## 2023-10-02 LAB — ECHOCARDIOGRAM COMPLETE
Area-P 1/2: 2.33 cm2
Height: 62 in
MV VTI: 1.93 cm2
S' Lateral: 3 cm
Weight: 1932.99 [oz_av]

## 2023-10-02 LAB — TROPONIN I (HIGH SENSITIVITY): Troponin I (High Sensitivity): 103 ng/L (ref <18)

## 2023-10-02 MED ORDER — ASPIRIN 81 MG PO CHEW
81.0000 mg | CHEWABLE_TABLET | ORAL | Status: AC
  Administered 2023-10-03: 81 mg via ORAL
  Filled 2023-10-02: qty 1

## 2023-10-02 MED ORDER — LOSARTAN POTASSIUM 25 MG PO TABS
25.0000 mg | ORAL_TABLET | Freq: Once | ORAL | Status: AC
Start: 2023-10-02 — End: 2023-10-02
  Administered 2023-10-02: 25 mg via ORAL
  Filled 2023-10-02: qty 1

## 2023-10-02 MED ORDER — LOSARTAN POTASSIUM 50 MG PO TABS
50.0000 mg | ORAL_TABLET | Freq: Every day | ORAL | Status: DC
  Administered 2023-10-03: 50 mg via ORAL
  Filled 2023-10-02: qty 1

## 2023-10-02 MED ORDER — SODIUM CHLORIDE 0.9 % WEIGHT BASED INFUSION
1.0000 mL/kg/h | INTRAVENOUS | Status: DC
  Administered 2023-10-03: 1 mL/kg/h via INTRAVENOUS

## 2023-10-02 NOTE — Progress Notes (Signed)
PROGRESS NOTE    [name]  ZOX:[id number] DOB: [birth year] DOA: 09/30/2023 PCP: Billie Lade, MD   Brief Narrative: [name] is a 57 y.o. female with a history of bipolar 1 disorder, ADD, carotid artery disease, COPD, chronic diastolic heart failure, CAD status post three-vessel CABG, CVA, depression, fibromyalgia, GERD, hypertension, hyperlipidemia, OSA on CPAP.  Patient presented secondary to chest pain with concern for ACS cardiology consulted.  Patient is on heparin IV for medical management.  Plan for cardiac catheterization.   Assessment and Plan:  NSTEMI Patient with chest pain prior to admission. Significant risk factors. Troponin elevation with peak 398. Transthoracic Echocardiogram obtained on 12/7 significant for no regional wall motion abnormalities with cardiology concerned for SVG occlusion. On Heparin IV. -Cardiology recommendations, plan for heart catheterization on 12/9, heparin IV  History of CABG CAD Noted. -Continue aspirin and Lipitor  Hypertension Patient is managed on losartan as an outpatient.  Losartan increased to 50 mg daily per cardiology. -Losartan 50 mg daily  Hyperlipidemia -Continue Lipitor 80 mg daily  Chronic diastolic heart failure Stable.  Euvolemic.  Patient is managed on Lasix as needed as an outpatient.  Restless leg syndrome Noted.  COPD Stable.  No evidence of exacerbation.  Patient is not on therapy as an outpatient.  GERD Without esophagitis.  Patient is on Nexium as an outpatient.  Patient transition to Protonix while inpatient. -Continue Protonix  Anxiety Depression Patient is managed on Xanax, Wellbutrin XL, Prozac as an outpatient. -Continue Prozac, Wellbutrin XL, Xanax as needed  Insomnia -Continue Ambien nightly   DVT prophylaxis: Heparin IV Code Status:   Code Status: Full Code Family Communication: None at bedside Disposition Plan: Discharge home tomorrow pending cardiac catheterization  results   Consultants:  Cardiology  Procedures:  12/7: Transthoracic echocardiogram  Antimicrobials: None   Subjective: Patient reports no chest pain or dyspnea.  Patient is ambulated in the room without any development of dyspnea on exertion or chest pain.  Objective: BP (!) 166/75   Pulse (!) 53   Temp 97.6 F (36.4 C) (Axillary)   Resp 16   Ht 5\' 2"  (1.575 m)   Wt 54.8 kg   LMP 10/22/2015   SpO2 96%   BMI 22.10 kg/m   Examination:  General exam: Appears calm and comfortable Respiratory system: Clear to auscultation. Respiratory effort normal. Cardiovascular system: S1 & S2 heard, RRR.  Heart murmur Gastrointestinal system: Abdomen is nondistended, soft and nontender. Normal bowel sounds heard. Central nervous system: Alert and oriented. No focal neurological deficits. Musculoskeletal: No edema. No calf tenderness Psychiatry: Judgement and insight appear normal. Mood & affect appropriate.    Data Reviewed: I have personally reviewed following labs and imaging studies  CBC Lab Results  Component Value Date   WBC 4.6 10/02/2023   RBC 4.18 10/02/2023   HGB 12.1 10/02/2023   HCT 37.7 10/02/2023   MCV 90.2 10/02/2023   MCH 28.9 10/02/2023   PLT 383 10/02/2023   MCHC 32.1 10/02/2023   RDW 13.5 10/02/2023   LYMPHSABS 4.4 (H) 07/16/2022   MONOABS 0.4 12/01/2020   EOSABS 0.1 07/16/2022   BASOSABS 0.1 07/16/2022     Last metabolic panel Lab Results  Component Value Date   NA 136 09/30/2023   K 3.8 09/30/2023   CL 104 09/30/2023   CO2 22 09/30/2023   BUN 10 09/30/2023   CREATININE 0.76 09/30/2023   GLUCOSE 133 (H) 09/30/2023   GFRNONAA >60 09/30/2023   GFRAA >60 10/07/2019  CALCIUM 9.2 09/30/2023   PHOS 4.3 09/25/2023   PROT 6.9 09/30/2023   ALBUMIN 3.6 09/30/2023   LABGLOB 3.2 07/16/2022   AGRATIO 1.5 07/16/2022   BILITOT 0.6 09/30/2023   ALKPHOS 189 (H) 09/30/2023   AST 160 (H) 09/30/2023   ALT 89 (H) 09/30/2023   ANIONGAP 10 09/30/2023     GFR: Estimated Creatinine Clearance: 61.4 mL/min (by C-G formula based on SCr of 0.76 mg/dL).  Recent Results (from the past 240 hour(s))  MRSA Next Gen by PCR, Nasal     Status: None   Collection Time: 10/01/23  5:42 PM   Specimen: Nasal Mucosa; Nasal Swab  Result Value Ref Range Status   MRSA by PCR Next Gen NOT DETECTED NOT DETECTED Final    Comment: (NOTE) The GeneXpert MRSA Assay (FDA approved for NASAL specimens only), is one component of a comprehensive MRSA colonization surveillance program. It is not intended to diagnose MRSA infection nor to guide or monitor treatment for MRSA infections. Test performance is not FDA approved in patients less than 42 years old. Performed at [facility], [address], [place], [place] [address]       Radiology Studies: ECHOCARDIOGRAM COMPLETE  Result Date: 10/02/2023    ECHOCARDIOGRAM REPORT   Patient Name:   [name] Date of Exam: 10/01/2023 Medical Rec #:  [id number]        Height:       62.0 in Accession #:    [id number]       Weight:       120.8 lb Date of Birth:  09-Mar-1966        BSA:          1.543 m Patient Age:    57 years         BP:           150/69 mmHg Patient Gender: F                HR:           46 bpm. [facility]am [facility] Procedure: 2D Echo, Cardiac Doppler, Color Doppler and Intracardiac            Opacification Agent Indications:    NSTEMI  History:        Patient has prior history of Echocardiogram examinations, most                 recent 04/24/2021. CHF, Previous Myocardial Infarction and CAD,                 Prior CABG, Stroke, COPD and Carotid Disease; Risk Factors:Sleep                 Apnea, Hypertension and Dyslipidemia.  Sonographer:    Milda Smart Referring Phys: [id number] JAN A MANSY IMPRESSIONS  1. Septal basal anterior wall hypokinesis . Left ventricular ejection fraction, by estimation, is 50 to 55%. The left ventricle has low normal function. The left ventricle has no regional wall motion  abnormalities. The left ventricular internal cavity size was mildly dilated. Left ventricular diastolic parameters were normal.  2. Right ventricular systolic function is normal. The right ventricular size is normal.  3. The mitral valve is normal in structure. No evidence of mitral valve regurgitation. No evidence of mitral stenosis.  4. The aortic valve is tricuspid. Aortic valve regurgitation is not visualized. No aortic stenosis is present.  5. The inferior vena cava is normal in size with greater  than 50% respiratory variability, suggesting right atrial pressure of 3 mmHg. FINDINGS  Left Ventricle: Septal basal anterior wall hypokinesis. Left ventricular ejection fraction, by estimation, is 50 to 55%. The left ventricle has low normal function. The left ventricle has no regional wall motion abnormalities. Definity contrast agent was given IV to delineate the left ventricular endocardial borders. The left ventricular internal cavity size was mildly dilated. There is no left ventricular hypertrophy. Left ventricular diastolic parameters were normal. Right Ventricle: The right ventricular size is normal. No increase in right ventricular wall thickness. Right ventricular systolic function is normal. Left Atrium: Left atrial size was normal in size. Right Atrium: Right atrial size was normal in size. Pericardium: There is no evidence of pericardial effusion. Mitral Valve: The mitral valve is normal in structure. No evidence of mitral valve regurgitation. No evidence of mitral valve stenosis. MV peak gradient, 4.0 mmHg. The mean mitral valve gradient is 1.0 mmHg. Tricuspid Valve: The tricuspid valve is normal in structure. Tricuspid valve regurgitation is trivial. No evidence of tricuspid stenosis. Aortic Valve: The aortic valve is tricuspid. Aortic valve regurgitation is not visualized. No aortic stenosis is present. Pulmonic Valve: The pulmonic valve was normal in structure. Pulmonic valve regurgitation is trivial.  No evidence of pulmonic stenosis. Aorta: The aortic root is normal in size and structure. Venous: The inferior vena cava is normal in size with greater than 50% respiratory variability, suggesting right atrial pressure of 3 mmHg. IAS/Shunts: No atrial level shunt detected by color flow Doppler.  LEFT VENTRICLE PLAX 2D LVIDd:         4.60 cm   Diastology LVIDs:         3.00 cm   LV e' medial:    4.46 cm/s LV PW:         0.90 cm   LV E/e' medial:  21.3 LV IVS:        0.70 cm   LV e' lateral:   8.49 cm/s LVOT diam:     1.80 cm   LV E/e' lateral: 11.2 LV SV:         94 LV SV Index:   61 LVOT Area:     2.54 cm  RIGHT VENTRICLE            IVC RV S prime:     7.83 cm/s  IVC diam: 1.40 cm TAPSE (M-mode): 1.2 cm LEFT ATRIUM           Index        RIGHT ATRIUM          Index LA diam:      3.50 cm 2.27 cm/m   RA Area:     9.81 cm LA Vol (A4C): 35.6 ml 23.07 ml/m  RA Volume:   18.90 ml 12.25 ml/m  AORTIC VALVE LVOT Vmax:   155.00 cm/s LVOT Vmean:  97.500 cm/s LVOT VTI:    0.370 m  AORTA Ao Root diam: 2.70 cm Ao Asc diam:  2.60 cm MITRAL VALVE               TRICUSPID VALVE MV Area (PHT): 2.33 cm    TR Peak grad:   23.8 mmHg MV Area VTI:   1.93 cm    TR Vmax:        244.00 cm/s MV Peak grad:  4.0 mmHg MV Mean grad:  1.0 mmHg    SHUNTS MV Vmax:       1.00 m/s    Systemic VTI:  0.37 m MV Vmean:      51.7 cm/s   Systemic Diam: 1.80 cm MV Decel Time: 326 msec MV E velocity: 95.10 cm/s MV A velocity: 78.00 cm/s MV E/A ratio:  1.22 Charlton Haws MD Electronically signed by Charlton Haws MD Signature Date/Time: 10/02/2023/7:55:25 AM    Final    CT Angio Chest PE W and/or Wo Contrast  Result Date: 10/01/2023 CLINICAL DATA:  Chest pain and positive D-dimer, history of recent cholecystectomy EXAM: CT ANGIOGRAPHY CHEST WITH CONTRAST TECHNIQUE: Multidetector CT imaging of the chest was performed using the standard protocol during bolus administration of intravenous contrast. Multiplanar CT image reconstructions and MIPs were obtained  to evaluate the vascular anatomy. RADIATION DOSE REDUCTION: This exam was performed according to the departmental dose-optimization program which includes automated exposure control, adjustment of the mA and/or kV according to patient size and/or use of iterative reconstruction technique. CONTRAST:  75mL OMNIPAQUE IOHEXOL 350 MG/ML SOLN COMPARISON:  Chest from earlier in the same day. FINDINGS: Cardiovascular: Thoracic aorta demonstrates atherosclerotic calcifications. Changes of prior coronary bypass grafting are noted. No cardiac enlargement is seen. The pulmonary artery shows a normal branching pattern bilaterally. No filling defect to suggest pulmonary embolism is noted. Mediastinum/Nodes: Thoracic inlet is within normal limits. No hilar or mediastinal adenopathy is noted. The esophagus as visualized is within normal limits. Lungs/Pleura: Lungs are well aerated bilaterally. Diffuse emphysematous changes are noted particularly in the apices. No focal infiltrate or effusion is noted. Upper Abdomen: Visualized upper abdomen is unremarkable. Musculoskeletal: No acute abnormality noted. Review of the MIP images confirms the above findings. IMPRESSION: No evidence of pulmonary emboli. No acute abnormality noted. Aortic Atherosclerosis (ICD10-I70.0) and Emphysema (ICD10-J43.9). Electronically Signed   By: Alcide Clever M.D.   On: 10/01/2023 00:01   DG Chest 2 View  Result Date: 09/30/2023 CLINICAL DATA:  Chest pain, recent cholecystectomy EXAM: CHEST - 2 VIEW COMPARISON:  09/24/2023 FINDINGS: Frontal and lateral views of the chest demonstrate postsurgical changes from CABG. Cardiac silhouette is unremarkable. No airspace disease, effusion, or pneumothorax. Stable emphysema. No acute bony abnormalities. IMPRESSION: 1. Stable emphysema.  No acute process. Electronically Signed   By: Sharlet Salina M.D.   On: 09/30/2023 20:50      LOS: 0 days    Jacquelin [facility], MD [facility] 10/02/2023, 11:41 AM   If  7PM-7AM, please contact night-coverage [email or web address]

## 2023-10-02 NOTE — Progress Notes (Signed)
Patient Name: [name] Date of Encounter: 10/02/2023 [facility] Cardiologist: Dina Rich, MD   Interval Summary  .    No acute overnight events. Reports feeling well. Still not much appetite. No chest pain or shortness of breath.   Vital Signs .    Vitals:   10/01/23 2300 10/02/23 0500 10/02/23 0600 10/02/23 0700  BP: 136/80 (!) 169/64 (!) 159/47 (!) 151/92  Pulse: (!) 53 (!) 47 (!) 47 (!) 49  Resp: 18 13 20 16   Temp: 98.6 F (37 C) 98.4 F (36.9 C)  98.2 F (36.8 C)  TempSrc: Oral Oral  Oral  SpO2: 96% 96% 98% 95%  Weight:      Height:        Intake/Output Summary (Last 24 hours) at 10/02/2023 0950 Last data filed at 10/02/2023 0348 Gross per 24 hour  Intake 2254.39 ml  Output --  Net 2254.39 ml      09/30/2023    8:03 PM 09/26/2023   12:52 PM 09/25/2023    1:44 AM  Last 3 Weights  Weight (lbs) 120 lb 13 oz 120 lb 13 oz 121 lb  Weight (kg) 54.8 kg 54.8 kg 54.885 kg      Telemetry/ECG    Sinus bradycardia - Personally Reviewed  Physical Exam .   GEN: No acute distress.   Neck: No JVD. Cardiac: Bradycardic and regular rhythm. Resp: Normal work of breathing. Ext: No edema. Psych: Normal affect   Echo 10/01/23: IMPRESSIONS   1. Septal basal anterior wall hypokinesis . Left ventricular ejection  fraction, by estimation, is 50 to 55%. The left ventricle has low normal  function. The left ventricle has no regional wall motion abnormalities.  The left ventricular internal cavity  size was mildly dilated. Left ventricular diastolic parameters were  normal.   2. Right ventricular systolic function is normal. The right ventricular  size is normal.   3. The mitral valve is normal in structure. No evidence of mitral valve  regurgitation. No evidence of mitral stenosis.   4. The aortic valve is tricuspid. Aortic valve regurgitation is not  visualized. No aortic stenosis is present.   5. The inferior vena cava is normal in size with greater  than 50%  respiratory variability, suggesting right atrial pressure of 3 mmHg.    Assessment & Plan .     Assessment: [name] is a 57 y.o. female with a hx of CAD s/p CABG (LIMA-LAD, SVG-RI, SVG-RCA) in 2009, chronic diastolic heart failure, HTN, HLD, COPD, anxiety, bipolar disorder, history of CVA, fibromyalgia, OSA who is being seen 10/01/2023 for the evaluation of NSTEMI at the request of Dr. Laury Deep.  Echo done yesterday with new WMAs. Troponin rising but remains chest pain free and hemodynamically stable.     Problem List:  NSTEMI - Chest pain free currently. New WMAs on echo, suspect SVG occlusion. CAD s/p CABG HTN HLD Chronic diastolic heart failure - Appears euvolemic and well compensated. S/p lap chole on 12/2   Plan: -Continue IV heparin. -Tentatively planning for left heart cath tomorrow.  If patient develops refractory chest pain, hemodynamic instability or electrical arrhythmia then will proceed more urgently to catheterization. -Continue aspirin. -Continue statin. -Increase losartan for better blood pressure control. -Repeat echocardiogram with new WMAs - septal basal anterior wall hypokinesis.  -Given recent abdominal surgery and now on IV heparin for management of NSTEMI with possible need for PCI and dual antiplatelet therapy, recommend trending hemoglobin closely to look for  any postoperative bleeding. Hemoglobin has remained stable thus far.  For questions or updates, please contact [facility] Please consult [email or web address] for contact info under      Signed, Nobie Putnam, MD

## 2023-10-02 NOTE — Hospital Course (Addendum)
[name] is a 57 y.o. female with a history of bipolar 1 disorder, ADD, carotid artery disease, COPD, chronic diastolic heart failure, CAD status post three-vessel CABG, CVA, depression, fibromyalgia, GERD, hypertension, hyperlipidemia, OSA on CPAP.  Patient presented secondary to chest pain with concern for ACS cardiology consulted.  Patient is on heparin IV for medical management.  Patient underwent cardiac catheterization on 12/9 with no change compared to prior study in 2022. Cardiology recommendations for continued medical therapy.

## 2023-10-02 NOTE — Progress Notes (Signed)
PHARMACY - ANTICOAGULATION CONSULT NOTE  Pharmacy Consult for heparin Indication: chest pain/ACS  Allergies  Allergen Reactions   Isosorbide Nitrate Other (See Comments)    Headaches; Worsening Chest Pain    Patient Measurements: Height: 5\' 2"  (157.5 cm) Weight: 54.8 kg (120 lb 13 oz) IBW/kg (Calculated) : 50.1 Heparin Dosing Weight: 54.8kg   Vital Signs: Temp: 98.2 F (36.8 C) (12/08 0700) Temp Source: Oral (12/08 0700) BP: 151/92 (12/08 0700) Pulse Rate: 49 (12/08 0700)  Labs: Recent Labs    09/30/23 2006 09/30/23 2151 10/01/23 0800 10/01/23 1638 10/02/23 0224  HGB  --   --  12.0  --  12.1  HCT  --   --  38.1  --  37.7  PLT  --   --  310  --  383  HEPARINUNFRC  --   --  0.29* 0.31 0.42  CREATININE 0.76  --   --   --   --   TROPONINIHS 15 109* 347* 398*  --     Estimated Creatinine Clearance: 61.4 mL/min (by C-G formula based on SCr of 0.76 mg/dL).  Assessment: 43 yoF presented to ED with epigastric chest discomfort. Pharmacy consulted to dose heparin for ACS. PMH includes: CABG x3 (2009), HTN, HLD, recent hospitalization for acute cholecystitis.   -heparin level 0.42 on 850 units/hr -CBC stable -plans note for cath 12.9  Goal of Therapy:  Heparin level 0.3-0.7 units/ml Monitor platelets by anticoagulation protocol: Yes   Plan:  -Continue heparin 850 units/hr -Daily heparin level and CBC  Harland German, PharmD Clinical Pharmacist **Pharmacist phone directory can now be found on [email or web address] ([email or web address]).  Listed under [facility].

## 2023-10-02 NOTE — Plan of Care (Signed)
  Problem: Education: Goal: Knowledge of General Education information will improve Description: Including pain rating scale, medication(s)/side effects and non-pharmacologic comfort measures Outcome: Progressing   Problem: Clinical Measurements: Goal: Ability to maintain clinical measurements within normal limits will improve Outcome: Progressing Goal: Will remain free from infection Outcome: Progressing Goal: Diagnostic test results will improve Outcome: Progressing Goal: Respiratory complications will improve Outcome: Progressing   Problem: Activity: Goal: Risk for activity intolerance will decrease Outcome: Progressing   Problem: Nutrition: Goal: Adequate nutrition will be maintained Outcome: Progressing

## 2023-10-03 ENCOUNTER — Inpatient Hospital Stay (HOSPITAL_COMMUNITY)
Admission: EM | Disposition: A | Discharge status: Home / Self Care | Payer: Self-pay | Source: Home / Self Care | Attending: Family Medicine

## 2023-10-03 ENCOUNTER — Other Ambulatory Visit (HOSPITAL_COMMUNITY): Payer: Self-pay

## 2023-10-03 DIAGNOSIS — I249 Acute ischemic heart disease, unspecified: Secondary | ICD-10-CM | POA: Diagnosis not present

## 2023-10-03 DIAGNOSIS — I2510 Atherosclerotic heart disease of native coronary artery without angina pectoris: Secondary | ICD-10-CM | POA: Diagnosis not present

## 2023-10-03 DIAGNOSIS — I214 Non-ST elevation (NSTEMI) myocardial infarction: Secondary | ICD-10-CM | POA: Diagnosis not present

## 2023-10-03 HISTORY — PX: LEFT HEART CATH AND CORS/GRAFTS ANGIOGRAPHY: CATH118250

## 2023-10-03 LAB — CBC
HCT: 38.5 % (ref 36.0–46.0)
Hemoglobin: 12.6 g/dL (ref 12.0–15.0)
MCH: 29.6 pg (ref 26.0–34.0)
MCHC: 32.7 g/dL (ref 30.0–36.0)
MCV: 90.6 fL (ref 80.0–100.0)
Platelets: 382 10*3/uL (ref 150–400)
RBC: 4.25 MIL/uL (ref 3.87–5.11)
RDW: 13.5 % (ref 11.5–15.5)
WBC: 6.4 10*3/uL (ref 4.0–10.5)
nRBC: 0 % (ref 0.0–0.2)

## 2023-10-03 LAB — HEMOGLOBIN A1C
Hgb A1c MFr Bld: 5.7 % — ABNORMAL HIGH (ref 4.8–5.6)
Mean Plasma Glucose: 116.89 mg/dL

## 2023-10-03 LAB — BASIC METABOLIC PANEL
Anion gap: 8 (ref 5–15)
BUN: 10 mg/dL (ref 6–20)
CO2: 26 mmol/L (ref 22–32)
Calcium: 9 mg/dL (ref 8.9–10.3)
Chloride: 103 mmol/L (ref 98–111)
Creatinine, Ser: 0.76 mg/dL (ref 0.44–1.00)
GFR, Estimated: >60 mL/min (ref >60)
Glucose, Bld: 107 mg/dL — ABNORMAL HIGH (ref 70–99)
Potassium: 4.6 mmol/L (ref 3.5–5.1)
Sodium: 137 mmol/L (ref 135–145)

## 2023-10-03 LAB — LIPID PANEL
Cholesterol: 150 mg/dL (ref 0–200)
HDL: 91 mg/dL (ref >40)
LDL Cholesterol: 51 mg/dL (ref 0–99)
Total CHOL/HDL Ratio: 1.6 {ratio}
Triglycerides: 42 mg/dL (ref <150)
VLDL: 8 mg/dL (ref 0–40)

## 2023-10-03 LAB — LIPOPROTEIN A (LPA): Lipoprotein (a): 15 nmol/L (ref <75.0)

## 2023-10-03 LAB — HEPARIN LEVEL (UNFRACTIONATED): Heparin Unfractionated: 0.34 [IU]/mL (ref 0.30–0.70)

## 2023-10-03 SURGERY — LEFT HEART CATH AND CORS/GRAFTS ANGIOGRAPHY
Anesthesia: LOCAL

## 2023-10-03 MED ORDER — LIDOCAINE HCL (PF) 1 % IJ SOLN
INTRAMUSCULAR | Status: DC | PRN
  Administered 2023-10-03: 15 mL

## 2023-10-03 MED ORDER — SODIUM CHLORIDE 0.9 % IV SOLN: 250.0000 mL | INTRAVENOUS | Status: DC | PRN

## 2023-10-03 MED ORDER — ENOXAPARIN SODIUM 40 MG/0.4ML IJ SOSY: 40.0000 mg | PREFILLED_SYRINGE | INTRAMUSCULAR | Status: DC

## 2023-10-03 MED ORDER — SODIUM CHLORIDE 0.9 % WEIGHT BASED INFUSION: 1.0000 mL/kg/h | INTRAVENOUS | Status: DC

## 2023-10-03 MED ORDER — FENTANYL CITRATE (PF) 100 MCG/2ML IJ SOLN
INTRAMUSCULAR | Status: AC
  Filled 2023-10-03: qty 2

## 2023-10-03 MED ORDER — SODIUM CHLORIDE 0.9% FLUSH: 3.0000 mL | Freq: Two times a day (BID) | INTRAVENOUS | Status: DC

## 2023-10-03 MED ORDER — MIDAZOLAM HCL 2 MG/2ML IJ SOLN
INTRAMUSCULAR | Status: AC
  Filled 2023-10-03: qty 2

## 2023-10-03 MED ORDER — VERAPAMIL HCL 2.5 MG/ML IV SOLN
INTRAVENOUS | Status: AC
  Filled 2023-10-03: qty 2

## 2023-10-03 MED ORDER — FENTANYL CITRATE (PF) 100 MCG/2ML IJ SOLN
INTRAMUSCULAR | Status: DC | PRN
  Administered 2023-10-03: 25 ug via INTRAVENOUS

## 2023-10-03 MED ORDER — IOHEXOL 350 MG/ML SOLN
INTRAVENOUS | Status: DC | PRN
  Administered 2023-10-03: 60 mL via INTRA_ARTERIAL

## 2023-10-03 MED ORDER — LIDOCAINE HCL (PF) 1 % IJ SOLN
INTRAMUSCULAR | Status: AC
  Filled 2023-10-03: qty 30

## 2023-10-03 MED ORDER — MIDAZOLAM HCL 2 MG/2ML IJ SOLN
INTRAMUSCULAR | Status: DC | PRN
  Administered 2023-10-03: 2 mg via INTRAVENOUS

## 2023-10-03 MED ORDER — LOSARTAN POTASSIUM 50 MG PO TABS
50.0000 mg | ORAL_TABLET | Freq: Every day | ORAL | 0 refills | Status: AC
  Filled 2023-10-03 – 2024-01-26 (×3): qty 30, 30d supply, fill #0

## 2023-10-03 MED ORDER — ATORVASTATIN CALCIUM 80 MG PO TABS
80.0000 mg | ORAL_TABLET | Freq: Every day | ORAL | 0 refills | Status: AC
  Filled 2023-10-03: qty 30, 30d supply, fill #0

## 2023-10-03 MED ORDER — HEPARIN (PORCINE) IN NACL 1000-0.9 UT/500ML-% IV SOLN
INTRAVENOUS | Status: DC | PRN
  Administered 2023-10-03 (×2): 500 mL

## 2023-10-03 MED ORDER — SODIUM CHLORIDE 0.9% FLUSH: 3.0000 mL | INTRAVENOUS | Status: DC | PRN

## 2023-10-03 SURGICAL SUPPLY — 9 items
CATH INFINITI 5FR MULTPACK ANG (CATHETERS) IMPLANT
CLOSURE MYNX CONTROL 5F (Vascular Products) IMPLANT
GLIDESHEATH SLEND SS 6F .021 (SHEATH) IMPLANT
KIT MICROPUNCTURE NIT STIFF (SHEATH) IMPLANT
PACK CARDIAC CATHETERIZATION (CUSTOM PROCEDURE TRAY) ×2 IMPLANT
SET ATX-X65L (MISCELLANEOUS) IMPLANT
SHEATH PINNACLE 5F 10CM (SHEATH) IMPLANT
SHEATH PROBE COVER 6X72 (BAG) IMPLANT
WIRE EMERALD 3MM-J .035X150CM (WIRE) IMPLANT

## 2023-10-03 NOTE — Discharge Summary (Signed)
Physician Discharge Summary   Patient: [name] MRN: [id number] DOB: 1965/12/07  Admit date:     09/30/2023  Discharge date: 10/03/23  Discharge Physician: Jacquelin Hawking, MD   PCP: Billie Lade, MD   Recommendations at discharge:  PCP visit for hospital follow-up Cardiology visit for hospital follow-up  Discharge Diagnoses: Principal Problem:   ACS (acute coronary syndrome) [facility]) Active Problems:   Essential hypertension   Anxiety and depression   GERD without esophagitis   Restless leg syndrome   Chronic obstructive pulmonary disease (COPD) (HCC)   NSTEMI (non-ST elevated myocardial infarction) (HCC)  Resolved Problems:   * No resolved hospital problems. *  Hospital Course: [name] is a 57 y.o. female with a history of bipolar 1 disorder, ADD, carotid artery disease, COPD, chronic diastolic heart failure, CAD status post three-vessel CABG, CVA, depression, fibromyalgia, GERD, hypertension, hyperlipidemia, OSA on CPAP.  Patient presented secondary to chest pain with concern for ACS cardiology consulted.  Patient is on heparin IV for medical management.  Patient underwent cardiac catheterization on 12/9 with no change compared to prior study in 2022. Cardiology recommendations for continued medical therapy.  Assessment and Plan:  NSTEMI Patient with chest pain prior to admission. Significant risk factors. Troponin elevation with peak 398. Transthoracic Echocardiogram obtained on 12/7 significant for no regional wall motion abnormalities with cardiology concerned for SVG occlusion. On Heparin IV. Cardiac catheterization performed on 12/9 revealing no change compared to prior study in 2022 with recommendations for continued medical management since. Patient started on Lipitor 80 mg this admission and increased to losartan 50 mg daily   History of CABG CAD Noted. Continue aspirin and Lipitor.  Consult Drive hi   Hypertension Patient is managed on losartan as an  outpatient.  Losartan increased to 50 mg daily per cardiology. Continue Losartan 50 mg daily on discharge.   Hyperlipidemia Discharge on Lipitor 80 mg daily   Chronic diastolic heart failure Stable.  Euvolemic.  Patient is managed on Lasix as needed as an outpatient.   Restless leg syndrome Noted.   COPD Stable.  No evidence of exacerbation.  Patient is not on therapy as an outpatient.   GERD Without esophagitis.  Patient is on Nexium as an outpatient.  Patient transition to Protonix while inpatient. Nexium on discharge.   Anxiety Depression Patient is managed on Xanax, Wellbutrin XL, Prozac as an outpatient. Continue outpatient regimen.   Insomnia Continue Ambien nightly.   Consultants:  Cardiology   Procedures:  12/7: Transthoracic echocardiogram  Disposition: Home Diet recommendation: Cardiac diet   DISCHARGE MEDICATION: Allergies as of 10/03/2023       Reactions   Isosorbide Nitrate Other (See Comments)   Headaches; Worsening Chest Pain        Medication List     TAKE these medications    acetaminophen 500 MG tablet Commonly known as: TYLENOL Take 2 tablets (1,000 mg total) by mouth every 6 (six) hours for 7 days.   albuterol 108 (90 Base) MCG/ACT inhaler Commonly known as: VENTOLIN HFA Inhale 2 puffs into the lungs every 6 (six) hours as needed for wheezing or shortness of breath.   ALPRAZolam 0.5 MG tablet Commonly known as: XANAX TAKE 1 TABLET BY MOUTH THREE TIMES DAILY AS NEEDED What changed: reasons to take this   aspirin EC 81 MG tablet Take 1 tablet (81 mg total) by mouth daily.   atorvastatin 80 MG tablet Commonly known as: LIPITOR Take 1 tablet (80 mg total) by mouth  daily. Start taking on: October 04, 2023   buPROPion 150 MG 24 hr tablet Commonly known as: WELLBUTRIN XL TAKE 1 TABLET BY MOUTH DAILY   CENTRUM SILVER PO Take 1 tablet by mouth daily.   cyanocobalamin 100 MCG tablet Commonly known as: VITAMIN B12 Take 100 mcg  by mouth daily.   esomeprazole 40 MG capsule Commonly known as: NEXIUM TAKE ONE CAPSULE BY MOUTH DAILY   FLUoxetine 40 MG capsule Commonly known as: PROZAC Take 1 capsule (40 mg total) by mouth 2 (two) times daily. What changed:  how much to take when to take this   fluticasone 50 MCG/ACT nasal spray Commonly known as: FLONASE Place 2 sprays into both nostrils daily. What changed: when to take this   furosemide 40 MG tablet Commonly known as: LASIX Take 1 tablet (40 mg total) by mouth daily. What changed:  when to take this reasons to take this   losartan 50 MG tablet Commonly known as: COZAAR Take 1 tablet (50 mg total) by mouth daily. Start taking on: October 04, 2023 What changed:  medication strength how much to take   ondansetron 4 MG tablet Commonly known as: Zofran Take 1 tablet (4 mg total) by mouth daily as needed for nausea or vomiting.   oxyCODONE 5 MG immediate release tablet Commonly known as: Roxicodone Take 1 tablet (5 mg total) by mouth every 6 (six) hours as needed. What changed: reasons to take this   Repatha SureClick 140 MG/ML Soaj Generic drug: Evolocumab Inject 140 mg into the skin every 14 (fourteen) days.   VITAMIN D3 PO Take 1 tablet by mouth daily.   zolpidem 10 MG tablet Commonly known as: AMBIEN TAKE 1 TABLET BY MOUTH AT BEDTIME        Follow-up Information     Billie Lade, MD. Schedule an appointment as soon as possible for a visit in 1 week(s).   Specialty: Internal Medicine Why: For hospital follow-up Contact information: [address] [place] [address] [phone]         Antoine Poche, MD. Schedule an appointment as soon as possible for a visit.   Specialty: Cardiology Why: For hospital follow-up Contact information: [address] [place] [address] [phone]                Discharge Exam: BP (!) 152/69 (BP Location: Left Arm)   Pulse (!) 48   Temp 98.2 F (36.8  C) (Oral)   Resp 17   Ht 5\' 2"  (1.575 m)   Wt 54.8 kg   LMP 10/22/2015   SpO2 95%   BMI 22.10 kg/m   General exam: Appears calm and comfortable Respiratory system: Clear to auscultation. Respiratory effort normal. Cardiovascular system: S1 & S2 heard, Normal rate with regular rhythm. Gastrointestinal system: Abdomen is nondistended, soft and nontender. Normal bowel sounds heard. Central nervous system: Alert and oriented. No focal neurological deficits. Psychiatry: Judgement and insight appear normal. Mood & affect appropriate.   This Condition at discharge: stable  The results of significant diagnostics from this hospitalization (including imaging, microbiology, ancillary and laboratory) are listed below for reference.   Imaging Studies: CARDIAC CATHETERIZATION  Result Date: 10/03/2023   Prox RCA to Mid RCA lesion is 100% stenosed.   Origin to Prox Graft lesion is 100% stenosed.   SVG.   LIMA.   SVG graft was visualized by angiography and is normal in caliber.   The graft exhibits no disease.  The left ventricular systolic function is normal.   LV end diastolic pressure is normal.   The left ventricular ejection fraction is 55-65% by visual estimate. Normal left coronary artery. Occluded proximal RCA Patent SVG to RCA Occluded SVG to ramus intermediate. Atretic LIMA to LAD Normal LV function Normal LVEDP Plan: continued medical therapy. No change compared to prior study in 2022.   ECHOCARDIOGRAM COMPLETE  Result Date: 10/02/2023    ECHOCARDIOGRAM REPORT   Patient Name:   [name] Date of Exam: 10/01/2023 Medical Rec #:  [id number]        Height:       62.0 in Accession #:    [id number]       Weight:       120.8 lb Date of Birth:  12-13-1965        BSA:          1.543 m Patient Age:    57 years         BP:           150/69 mmHg Patient Gender: F                HR:           46 bpm. Exam [facility] Procedure: 2D Echo, Cardiac Doppler, Color Doppler and Intracardiac             Opacification Agent Indications:    NSTEMI  History:        Patient has prior history of Echocardiogram examinations, most                 recent 04/24/2021. CHF, Previous Myocardial Infarction and CAD,                 Prior CABG, Stroke, COPD and Carotid Disease; Risk Factors:Sleep                 Apnea, Hypertension and Dyslipidemia.  Sonographer:    Milda Smart Referring Phys: [id number] JAN A MANSY IMPRESSIONS  1. Septal basal anterior wall hypokinesis . Left ventricular ejection fraction, by estimation, is 50 to 55%. The left ventricle has low normal function. The left ventricle has no regional wall motion abnormalities. The left ventricular internal cavity size was mildly dilated. Left ventricular diastolic parameters were normal.  2. Right ventricular systolic function is normal. The right ventricular size is normal.  3. The mitral valve is normal in structure. No evidence of mitral valve regurgitation. No evidence of mitral stenosis.  4. The aortic valve is tricuspid. Aortic valve regurgitation is not visualized. No aortic stenosis is present.  5. The inferior vena cava is normal in size with greater than 50% respiratory variability, suggesting right atrial pressure of 3 mmHg. FINDINGS  Left Ventricle: Septal basal anterior wall hypokinesis. Left ventricular ejection fraction, by estimation, is 50 to 55%. The left ventricle has low normal function. The left ventricle has no regional wall motion abnormalities. Definity contrast agent was given IV to delineate the left ventricular endocardial borders. The left ventricular internal cavity size was mildly dilated. There is no left ventricular hypertrophy. Left ventricular diastolic parameters were normal. Right Ventricle: The right ventricular size is normal. No increase in right ventricular wall thickness. Right ventricular systolic function is normal. Left Atrium: Left atrial size was normal in size. Right Atrium: Right atrial size was normal in size.  Pericardium: There is no evidence of pericardial effusion. Mitral Valve: The mitral valve is normal in structure. No evidence  of mitral valve regurgitation. No evidence of mitral valve stenosis. MV peak gradient, 4.0 mmHg. The mean mitral valve gradient is 1.0 mmHg. Tricuspid Valve: The tricuspid valve is normal in structure. Tricuspid valve regurgitation is trivial. No evidence of tricuspid stenosis. Aortic Valve: The aortic valve is tricuspid. Aortic valve regurgitation is not visualized. No aortic stenosis is present. Pulmonic Valve: The pulmonic valve was normal in structure. Pulmonic valve regurgitation is trivial. No evidence of pulmonic stenosis. Aorta: The aortic root is normal in size and structure. Venous: The inferior vena cava is normal in size with greater than 50% respiratory variability, suggesting right atrial pressure of 3 mmHg. IAS/Shunts: No atrial level shunt detected by color flow Doppler.  LEFT VENTRICLE PLAX 2D LVIDd:         4.60 cm   Diastology LVIDs:         3.00 cm   LV e' medial:    4.46 cm/s LV PW:         0.90 cm   LV E/e' medial:  21.3 LV IVS:        0.70 cm   LV e' lateral:   8.49 cm/s LVOT diam:     1.80 cm   LV E/e' lateral: 11.2 LV SV:         94 LV SV Index:   61 LVOT Area:     2.54 cm  RIGHT VENTRICLE            IVC RV S prime:     7.83 cm/s  IVC diam: 1.40 cm TAPSE (M-mode): 1.2 cm LEFT ATRIUM           Index        RIGHT ATRIUM          Index LA diam:      3.50 cm 2.27 cm/m   RA Area:     9.81 cm LA Vol (A4C): 35.6 ml 23.07 ml/m  RA Volume:   18.90 ml 12.25 ml/m  AORTIC VALVE LVOT Vmax:   155.00 cm/s LVOT Vmean:  97.500 cm/s LVOT VTI:    0.370 m  AORTA Ao Root diam: 2.70 cm Ao Asc diam:  2.60 cm MITRAL VALVE               TRICUSPID VALVE MV Area (PHT): 2.33 cm    TR Peak grad:   23.8 mmHg MV Area VTI:   1.93 cm    TR Vmax:        244.00 cm/s MV Peak grad:  4.0 mmHg MV Mean grad:  1.0 mmHg    SHUNTS MV Vmax:       1.00 m/s    Systemic VTI:  0.37 m MV Vmean:      51.7 cm/s    Systemic Diam: 1.80 cm MV Decel Time: 326 msec MV E velocity: 95.10 cm/s MV A velocity: 78.00 cm/s MV E/A ratio:  1.22 Charlton Haws MD Electronically signed by Charlton Haws MD Signature Date/Time: 10/02/2023/7:55:25 AM    Final    CT Angio Chest PE W and/or Wo Contrast  Result Date: 10/01/2023 CLINICAL DATA:  Chest pain and positive D-dimer, history of recent cholecystectomy EXAM: CT ANGIOGRAPHY CHEST WITH CONTRAST TECHNIQUE: Multidetector CT imaging of the chest was performed using the standard protocol during bolus administration of intravenous contrast. Multiplanar CT image reconstructions and MIPs were obtained to evaluate the vascular anatomy. RADIATION DOSE REDUCTION: This exam was performed according to the departmental dose-optimization program which includes automated exposure control, adjustment  of the mA and/or kV according to patient size and/or use of iterative reconstruction technique. CONTRAST:  75mL OMNIPAQUE IOHEXOL 350 MG/ML SOLN COMPARISON:  Chest from earlier in the same day. FINDINGS: Cardiovascular: Thoracic aorta demonstrates atherosclerotic calcifications. Changes of prior coronary bypass grafting are noted. No cardiac enlargement is seen. The pulmonary artery shows a normal branching pattern bilaterally. No filling defect to suggest pulmonary embolism is noted. Mediastinum/Nodes: Thoracic inlet is within normal limits. No hilar or mediastinal adenopathy is noted. The esophagus as visualized is within normal limits. Lungs/Pleura: Lungs are well aerated bilaterally. Diffuse emphysematous changes are noted particularly in the apices. No focal infiltrate or effusion is noted. Upper Abdomen: Visualized upper abdomen is unremarkable. Musculoskeletal: No acute abnormality noted. Review of the MIP images confirms the above findings. IMPRESSION: No evidence of pulmonary emboli. No acute abnormality noted. Aortic Atherosclerosis (ICD10-I70.0) and Emphysema (ICD10-J43.9). Electronically Signed    By: Alcide Clever M.D.   On: 10/01/2023 00:01   DG Chest 2 View  Result Date: 09/30/2023 CLINICAL DATA:  Chest pain, recent cholecystectomy EXAM: CHEST - 2 VIEW COMPARISON:  09/24/2023 FINDINGS: Frontal and lateral views of the chest demonstrate postsurgical changes from CABG. Cardiac silhouette is unremarkable. No airspace disease, effusion, or pneumothorax. Stable emphysema. No acute bony abnormalities. IMPRESSION: 1. Stable emphysema.  No acute process. Electronically Signed   By: Sharlet Salina M.D.   On: 09/30/2023 20:50   US Abdomen Limited  Result Date: 09/25/2023 CLINICAL DATA:  Suggestion potential gallbladder wall thickening/edema by CT of the abdomen. EXAM: ULTRASOUND ABDOMEN LIMITED RIGHT UPPER QUADRANT COMPARISON:  CT of the abdomen on 09/24/2023 FINDINGS: Gallbladder: There are some tiny mobile gallstones in the gallbladder. Gallbladder wall is not overtly thickened measuring up to approximately 3 mm anteriorly. No sonographic Eulah Pont sign was elicited. No pericholecystic fluid is identified. Common bile duct: Diameter: 4 mm. Liver: The liver demonstrates mildly increased echogenicity, likely reflecting steatosis. No overt cirrhotic contour abnormalities or focal lesions are identified. There is no evidence of intrahepatic biliary ductal dilatation. Portal vein is patent on color Doppler imaging with normal direction of blood flow towards the liver. Other: No ascites identified in the right upper quadrant. IMPRESSION: 1. Cholelithiasis with tiny gallstones present. No definitive evidence of acute cholecystitis by ultrasound. 2. Mildly increased echogenicity of the liver, likely reflecting steatosis. Electronically Signed   By: Irish Lack M.D.   On: 09/25/2023 12:21   CT ABDOMEN PELVIS W CONTRAST  Result Date: 09/25/2023 CLINICAL DATA:  Biliary obstruction suspected. Sudden onset abdominal and chest pain. EXAM: CT ABDOMEN AND PELVIS WITH CONTRAST TECHNIQUE: Multidetector CT imaging of the  abdomen and pelvis was performed using the standard protocol following bolus administration of intravenous contrast. RADIATION DOSE REDUCTION: This exam was performed according to the departmental dose-optimization program which includes automated exposure control, adjustment of the mA and/or kV according to patient size and/or use of iterative reconstruction technique. CONTRAST:  OMNIPAQUE IOHEXOL 300 MG/ML  SOLN COMPARISON:  12/04/2009 FINDINGS: Lower chest: Lung bases are clear. Hepatobiliary: Diffuse fatty infiltration of the liver. No focal liver lesions. Gallbladder wall thickening and edema. This may indicate acute cholecystitis. No stones are identified. No bile duct dilatation. Pancreas: Unremarkable. No pancreatic ductal dilatation or surrounding inflammatory changes. Spleen: Normal in size without focal abnormality. Adrenals/Urinary Tract: Adrenal glands are unremarkable. Kidneys are normal, without renal calculi, focal lesion, or hydronephrosis. Bladder is unremarkable. Stomach/Bowel: Stomach is within normal limits. Appendix appears normal. No evidence of bowel wall thickening, distention,  or inflammatory changes. Vascular/Lymphatic: Aortic atherosclerosis. No enlarged abdominal or pelvic lymph nodes. Reproductive: Uterus and bilateral adnexa are unremarkable. Other: No abdominal wall hernia or abnormality. No abdominopelvic ascites. Musculoskeletal: No acute or significant osseous findings. IMPRESSION: 1. Mild gallbladder wall thickening and edema. This may represent acute cholecystitis. No stones or bile duct dilatation identified. 2. No evidence of bowel obstruction or inflammation. 3. Diffuse fatty infiltration of the liver. 4. Aortic atherosclerosis. Electronically Signed   By: Burman Nieves M.D.   On: 09/25/2023 00:09   DG Chest 2 View  Result Date: 09/24/2023 CLINICAL DATA:  Chest pain EXAM: CHEST - 2 VIEW COMPARISON:  CXR 04/23/21. CT chest 06/18/2022 FINDINGS: The heart and  mediastinal contours are unchanged. Atherosclerotic plaque. Hyperinflation of the lungs. Biapical pleural/pulmonary scarring. No focal consolidation. No pulmonary edema. No pleural effusion. No pneumothorax. No acute osseous abnormality. Sternotomy wires intact. IMPRESSION: 1. No active cardiopulmonary disease. 2.  Aortic Atherosclerosis (ICD10-I70.0). Electronically Signed   By: Tish Frederickson M.D.   On: 09/24/2023 21:44    Microbiology: Results for orders placed or performed during the hospital encounter of 09/30/23  MRSA Next Gen by PCR, Nasal     Status: None   Collection Time: 10/01/23  5:42 PM   Specimen: Nasal Mucosa; Nasal Swab  Result Value Ref Range Status   MRSA by PCR Next Gen NOT DETECTED NOT DETECTED Final    Comment: (NOTE) The GeneXpert MRSA Assay (FDA approved for NASAL specimens only), is one component of a comprehensive MRSA colonization surveillance program. It is not intended to diagnose MRSA infection nor to guide or monitor treatment for MRSA infections. Test performance is not FDA approved in patients less than 27 years old. Performed at [facility], [address], [place], [place] [address]     Labs: CBC: Recent Labs  Lab 10/01/23 0800 10/02/23 0224 10/03/23 0229  WBC 4.9 4.6 6.4  HGB 12.0 12.1 12.6  HCT 38.1 37.7 38.5  MCV 93.2 90.2 90.6  PLT 310 383 382   Basic Metabolic Panel: Recent Labs  Lab 09/30/23 2006 10/03/23 0228  NA 136 137  K 3.8 4.6  CL 104 103  CO2 22 26  GLUCOSE 133* 107*  BUN 10 10  CREATININE 0.76 0.76  CALCIUM 9.2 9.0   Liver Function Tests: Recent Labs  Lab 09/30/23 2006  AST 160*  ALT 89*  ALKPHOS 189*  BILITOT 0.6  PROT 6.9  ALBUMIN 3.6    Discharge time spent: 35 minutes.  Signed: Jacquelin Hawking, MD [facility] 10/03/2023

## 2023-10-03 NOTE — Discharge Instructions (Signed)
[name],  You were in the hospital with concern for a heart attack. Your heart catheterization thankfully looked good. Your medications have been adjusted. Please follow-up with your PCP and cardiologist.

## 2023-10-03 NOTE — Progress Notes (Signed)
Bedrest completed. Patient ambulated. No c/o pain. No bleeding at site or hematoma noted. MD made aware that patient successfully ambulated.

## 2023-10-03 NOTE — Progress Notes (Signed)
PHARMACY - ANTICOAGULATION CONSULT NOTE  Pharmacy Consult for heparin Indication: chest pain/ACS  Allergies  Allergen Reactions   Isosorbide Nitrate Other (See Comments)    Headaches; Worsening Chest Pain    Patient Measurements: Height: 5\' 2"  (157.5 cm) Weight: 54.8 kg (120 lb 13 oz) IBW/kg (Calculated) : 50.1 Heparin Dosing Weight: 54.8kg   Vital Signs: Temp: 98.4 F (36.9 C) (12/09 0725) Temp Source: Oral (12/09 0725) BP: 145/63 (12/09 0725) Pulse Rate: 46 (12/09 0725)  Labs: Recent Labs    09/30/23 2006 09/30/23 2151 10/01/23 0800 10/01/23 1638 10/02/23 0224 10/02/23 1012 10/03/23 0229  HGB  --    < > 12.0  --  12.1  --  12.6  HCT  --   --  38.1  --  37.7  --  38.5  PLT  --   --  310  --  383  --  382  HEPARINUNFRC  --    < > 0.29* 0.31 0.42  --  0.34  CREATININE 0.76  --   --   --   --   --   --   TROPONINIHS 15   < > 347* 398*  --  103*  --    < > = values in this interval not displayed.    Estimated Creatinine Clearance: 61.4 mL/min (by C-G formula based on SCr of 0.76 mg/dL).  Assessment: 38 yoF presented to ED with epigastric chest discomfort. Pharmacy consulted to dose heparin for ACS. PMH includes: CABG x3 (2009), HTN, HLD, recent hospitalization for acute cholecystitis.   -heparin level 0.34 on 850 units/hr -CBC stable -plans noted for cath 12.9  Goal of Therapy:  Heparin level 0.3-0.7 units/ml Monitor platelets by anticoagulation protocol: Yes   Plan:  -Continue heparin 850 units/hr -Daily heparin level and CBC  Reece Leader, Loura Back, BCPS, BCCP Clinical Pharmacist  10/03/2023 7:35 AM   [facility] pharmacy phone numbers are listed on [email or web address]

## 2023-10-03 NOTE — Interval H&P Note (Signed)
History and Physical Interval Note:  10/03/2023 2:08 PM  Aaron Edelman Freelove  has presented today for surgery, with the diagnosis of NSTEMI.  The various methods of treatment have been discussed with the patient and family. After consideration of risks, benefits and other options for treatment, the patient has consented to  Procedure(s): LEFT HEART CATH AND CORS/GRAFTS ANGIOGRAPHY (N/A) as a surgical intervention.  The patient's history has been reviewed, patient examined, no change in status, stable for surgery.  I have reviewed the patient's chart and labs.  Questions were answered to the patient's satisfaction.    Cath Lab Visit (complete for each Cath Lab visit)  Clinical Evaluation Leading to the Procedure:   ACS: Yes.    Non-ACS:    Anginal Classification: CCS IV  Anti-ischemic medical therapy: No Therapy  Non-Invasive Test Results: No non-invasive testing performed  Prior CABG: Previous CABG       Theron Arista [facility] 10/03/2023 2:08 PM

## 2023-10-03 NOTE — Plan of Care (Signed)
  Problem: Nutrition: Goal: Adequate nutrition will be maintained Outcome: Progressing   Problem: Coping: Goal: Level of anxiety will decrease Outcome: Progressing   Problem: Pain Management: Goal: General experience of comfort will improve Outcome: Progressing   Problem: Safety: Goal: Ability to remain free from injury will improve Outcome: Progressing   Problem: Skin Integrity: Goal: Risk for impaired skin integrity will decrease Outcome: Progressing   Problem: Activity: Goal: Ability to tolerate increased activity will improve Outcome: Progressing

## 2023-10-03 NOTE — Plan of Care (Signed)
  Problem: Education: Goal: Knowledge of General Education information will improve Description: Including pain rating scale, medication(s)/side effects and non-pharmacologic comfort measures Outcome: Adequate for Discharge   Problem: Health Behavior/Discharge Planning: Goal: Ability to manage health-related needs will improve Outcome: Adequate for Discharge   Problem: Clinical Measurements: Goal: Ability to maintain clinical measurements within normal limits will improve Outcome: Adequate for Discharge Goal: Will remain free from infection Outcome: Adequate for Discharge Goal: Diagnostic test results will improve Outcome: Adequate for Discharge Goal: Respiratory complications will improve Outcome: Adequate for Discharge Goal: Cardiovascular complication will be avoided Outcome: Adequate for Discharge   Problem: Activity: Goal: Risk for activity intolerance will decrease Outcome: Adequate for Discharge   Problem: Nutrition: Goal: Adequate nutrition will be maintained 10/03/2023 1722 by Garth Bigness, RN Outcome: Adequate for Discharge 10/03/2023 0809 by Garth Bigness, RN Outcome: Progressing   Problem: Coping: Goal: Level of anxiety will decrease 10/03/2023 1722 by Garth Bigness, RN Outcome: Adequate for Discharge 10/03/2023 0809 by Garth Bigness, RN Outcome: Progressing   Problem: Elimination: Goal: Will not experience complications related to bowel motility Outcome: Adequate for Discharge Goal: Will not experience complications related to urinary retention Outcome: Adequate for Discharge   Problem: Pain Management: Goal: General experience of comfort will improve 10/03/2023 1722 by Garth Bigness, RN Outcome: Adequate for Discharge 10/03/2023 0809 by Garth Bigness, RN Outcome: Progressing   Problem: Safety: Goal: Ability to remain free from injury will improve 10/03/2023 1722 by Garth Bigness, RN Outcome: Adequate for Discharge 10/03/2023  0809 by Garth Bigness, RN Outcome: Progressing   Problem: Skin Integrity: Goal: Risk for impaired skin integrity will decrease 10/03/2023 1722 by Garth Bigness, RN Outcome: Adequate for Discharge 10/03/2023 0809 by Garth Bigness, RN Outcome: Progressing   Problem: Education: Goal: Understanding of cardiac disease, CV risk reduction, and recovery process will improve Outcome: Adequate for Discharge Goal: Individualized Educational Video(s) Outcome: Adequate for Discharge   Problem: Activity: Goal: Ability to tolerate increased activity will improve 10/03/2023 1722 by Garth Bigness, RN Outcome: Adequate for Discharge 10/03/2023 0809 by Garth Bigness, RN Outcome: Progressing   Problem: Cardiac: Goal: Ability to achieve and maintain adequate cardiovascular perfusion will improve Outcome: Adequate for Discharge   Problem: Health Behavior/Discharge Planning: Goal: Ability to safely manage health-related needs after discharge will improve Outcome: Adequate for Discharge   Problem: Education: Goal: Understanding of CV disease, CV risk reduction, and recovery process will improve Outcome: Adequate for Discharge Goal: Individualized Educational Video(s) Outcome: Adequate for Discharge   Problem: Activity: Goal: Ability to return to baseline activity level will improve Outcome: Adequate for Discharge   Problem: Cardiovascular: Goal: Ability to achieve and maintain adequate cardiovascular perfusion will improve Outcome: Adequate for Discharge Goal: Vascular access site(s) Level 0-1 will be maintained Outcome: Adequate for Discharge   Problem: Health Behavior/Discharge Planning: Goal: Ability to safely manage health-related needs after discharge will improve Outcome: Adequate for Discharge

## 2023-10-03 NOTE — Progress Notes (Signed)
Patient Name: [name] Date of Encounter: 10/03/2023  [facility] Cardiologist: Dina Rich, MD   Interval Summary  .    No acute overnight events. Reports feeling well. Still not much appetite. No chest pain or shortness of breath.   Vital Signs .    Vitals:   10/02/23 1907 10/03/23 0004 10/03/23 0443 10/03/23 0725  BP: (!) 134/57 132/64 137/76 (!) 145/63  Pulse: (!) 51 (!) 55 (!) 54 (!) 46  Resp: 18 17 20 17   Temp: 98.3 F (36.8 C) 97.9 F (36.6 C) 98.4 F (36.9 C) 98.4 F (36.9 C)  TempSrc: Oral Oral Oral Oral  SpO2: 95% 94% 97% 95%  Weight:      Height:        Intake/Output Summary (Last 24 hours) at 10/03/2023 1000 Last data filed at 10/03/2023 0800 Gross per 24 hour  Intake 337.65 ml  Output --  Net 337.65 ml      09/30/2023    8:03 PM 09/26/2023   12:52 PM 09/25/2023    1:44 AM  Last 3 Weights  Weight (lbs) 120 lb 13 oz 120 lb 13 oz 121 lb  Weight (kg) 54.8 kg 54.8 kg 54.885 kg      Telemetry/ECG    Sinus bradycardia - Personally Reviewed  Physical Exam .   GEN: No acute distress.   Neck: No JVD. Cardiac: Bradycardic and regular rhythm. Resp: Normal work of breathing. Ext: No edema. Psych: Normal affect   Echo 10/01/23: IMPRESSIONS   1. Septal basal anterior wall hypokinesis . Left ventricular ejection  fraction, by estimation, is 50 to 55%. The left ventricle has low normal  function. The left ventricle has no regional wall motion abnormalities.  The left ventricular internal cavity  size was mildly dilated. Left ventricular diastolic parameters were  normal.   2. Right ventricular systolic function is normal. The right ventricular  size is normal.   3. The mitral valve is normal in structure. No evidence of mitral valve  regurgitation. No evidence of mitral stenosis.   4. The aortic valve is tricuspid. Aortic valve regurgitation is not  visualized. No aortic stenosis is present.   5. The inferior vena cava is normal in  size with greater than 50%  respiratory variability, suggesting right atrial pressure of 3 mmHg.    Assessment & Plan .     Assessment: [name] is a 57 y.o. female with a hx of CAD s/p CABG (LIMA-LAD, SVG-RI, SVG-RCA) in 2009, chronic diastolic heart failure, HTN, HLD, COPD, anxiety, bipolar disorder, history of CVA, fibromyalgia, OSA who is being seen 10/01/2023 for the evaluation of NSTEMI at the request of Dr. Laury Deep.  Echo done yesterday with new WMAs- basal anterior septum. Low normal EF.  Troponin rising but remains chest pain free and hemodynamically stable.     Problem List:  NSTEMI - Chest pain free currently. New WMAs on echo. ? ACS versus demand ischemia in setting of recent cholecystectomy. CAD s/p CABG HTN HLD Chronic diastolic heart failure - Appears euvolemic and well compensated. S/p lap chole on 12/2   Plan: -Continue IV heparin. -Plan cardiac cath today. I would recommend femoral approach. On her last cardiac cath in 2022 she reports she had a lot of arm pain during the procedure and for weeks after. Suspect her radial artery is fairly small. Also in reviewing cardiac cath in 2009 she had narrowing of the left main involving the ostial LAD and ramus. In 2022 this  appeared normal suggesting that SCAD may have been the issue. If she has blockage would recommend intravascular imaging if PCI considered.  -Continue aspirin. -Continue statin. -on higher losartan dose with improved blood pressure control. -no beta blocker due to bradycardia.    For questions or updates, please contact [facility] Please consult [email or web address] for contact info under      Signed, Delshawn Stech [place], MD,FACC 10/03/2023 10:05 AM

## 2023-10-03 NOTE — H&P (View-Only) (Signed)
Patient Name: [name] Date of Encounter: 10/03/2023  [facility] Cardiologist: Dina Rich, MD   Interval Summary  .    No acute overnight events. Reports feeling well. Still not much appetite. No chest pain or shortness of breath.   Vital Signs .    Vitals:   10/02/23 1907 10/03/23 0004 10/03/23 0443 10/03/23 0725  BP: (!) 134/57 132/64 137/76 (!) 145/63  Pulse: (!) 51 (!) 55 (!) 54 (!) 46  Resp: 18 17 20 17   Temp: 98.3 F (36.8 C) 97.9 F (36.6 C) 98.4 F (36.9 C) 98.4 F (36.9 C)  TempSrc: Oral Oral Oral Oral  SpO2: 95% 94% 97% 95%  Weight:      Height:        Intake/Output Summary (Last 24 hours) at 10/03/2023 1000 Last data filed at 10/03/2023 0800 Gross per 24 hour  Intake 337.65 ml  Output --  Net 337.65 ml      09/30/2023    8:03 PM 09/26/2023   12:52 PM 09/25/2023    1:44 AM  Last 3 Weights  Weight (lbs) 120 lb 13 oz 120 lb 13 oz 121 lb  Weight (kg) 54.8 kg 54.8 kg 54.885 kg      Telemetry/ECG    Sinus bradycardia - Personally Reviewed  Physical Exam .   GEN: No acute distress.   Neck: No JVD. Cardiac: Bradycardic and regular rhythm. Resp: Normal work of breathing. Ext: No edema. Psych: Normal affect   Echo 10/01/23: IMPRESSIONS   1. Septal basal anterior wall hypokinesis . Left ventricular ejection  fraction, by estimation, is 50 to 55%. The left ventricle has low normal  function. The left ventricle has no regional wall motion abnormalities.  The left ventricular internal cavity  size was mildly dilated. Left ventricular diastolic parameters were  normal.   2. Right ventricular systolic function is normal. The right ventricular  size is normal.   3. The mitral valve is normal in structure. No evidence of mitral valve  regurgitation. No evidence of mitral stenosis.   4. The aortic valve is tricuspid. Aortic valve regurgitation is not  visualized. No aortic stenosis is present.   5. The inferior vena cava is normal in  size with greater than 50%  respiratory variability, suggesting right atrial pressure of 3 mmHg.    Assessment & Plan .     Assessment: [name] is a 57 y.o. female with a hx of CAD s/p CABG (LIMA-LAD, SVG-RI, SVG-RCA) in 2009, chronic diastolic heart failure, HTN, HLD, COPD, anxiety, bipolar disorder, history of CVA, fibromyalgia, OSA who is being seen 10/01/2023 for the evaluation of NSTEMI at the request of Dr. Laury Deep.  Echo done yesterday with new WMAs- basal anterior septum. Low normal EF.  Troponin rising but remains chest pain free and hemodynamically stable.     Problem List:  NSTEMI - Chest pain free currently. New WMAs on echo. ? ACS versus demand ischemia in setting of recent cholecystectomy. CAD s/p CABG HTN HLD Chronic diastolic heart failure - Appears euvolemic and well compensated. S/p lap chole on 12/2   Plan: -Continue IV heparin. -Plan cardiac cath today. I would recommend femoral approach. On her last cardiac cath in 2022 she reports she had a lot of arm pain during the procedure and for weeks after. Suspect her radial artery is fairly small. Also in reviewing cardiac cath in 2009 she had narrowing of the left main involving the ostial LAD and ramus. In 2022 this  appeared normal suggesting that SCAD may have been the issue. If she has blockage would recommend intravascular imaging if PCI considered.  -Continue aspirin. -Continue statin. -on higher losartan dose with improved blood pressure control. -no beta blocker due to bradycardia.    For questions or updates, please contact [facility] Please consult [email or web address] for contact info under      Signed, Delshawn Stech [place], MD,FACC 10/03/2023 10:05 AM

## 2023-10-04 ENCOUNTER — Encounter (HOSPITAL_COMMUNITY): Payer: Self-pay | Admitting: Cardiology

## 2023-10-04 MED FILL — Verapamil HCl IV Soln 2.5 MG/ML: INTRAVENOUS | Qty: 2 | Status: AC

## 2023-10-12 ENCOUNTER — Ambulatory Visit (INDEPENDENT_AMBULATORY_CARE_PROVIDER_SITE_OTHER): Payer: BC Managed Care – PPO | Admitting: Surgery

## 2023-10-12 VITALS — BP 140/53 | HR 40 | Temp 98.2°F | Resp 14 | Ht 62.0 in | Wt 120.0 lb

## 2023-10-12 DIAGNOSIS — Z09 Encounter for follow-up examination after completed treatment for conditions other than malignant neoplasm: Secondary | ICD-10-CM

## 2023-10-12 NOTE — Progress Notes (Unsigned)
[facility] Note   HPI:  57 y.o. Female presents to clinic for post-op follow-up status post robotic assisted laparoscopic cholecystectomy on 12/2 for acute cholecystitis.  Patient has been doing okay since she left the hospital.  She actually was readmitted to the hospital shortly after discharge home for heart issues.  She still has some pain at her incision sites and a little bit of dull pain in her right upper quadrant, but this continues to improve.  She is tolerating a diet without nausea and vomiting.  She also is moving her bowels without issue.  Denies fevers and chills.  Review of Systems:  All other review of systems: otherwise negative   Vital Signs:  BP (!) 140/53   Pulse (!) 40   Temp 98.2 F (36.8 C) (Oral)   Resp 14   Ht 5\' 2"  (1.575 m)   Wt 120 lb (54.4 kg)   LMP 10/22/2015   SpO2 97%   BMI 21.95 kg/m    Physical Exam:  Physical Exam Vitals reviewed.  Constitutional:      Appearance: Normal appearance.  Abdominal:     Comments: Abdomen soft, nondistended, no percussion tenderness, nontender to palpation; no rigidity, guarding, rebound tenderness; laparoscopic incisions healing well  Neurological:     Mental Status: She is alert.     Laboratory studies: None  Imaging:  None  Pathology: A. GALLBLADDER, CHOLECYSTECTOMY:       Chronic cholecystitis.       Cholelithiasis.    Assessment:  57 y.o. yo Female who presents for follow-up status post robotic assisted laparoscopic cholecystectomy on 12/2 for acute cholecystitis.  Plan:  -Patient overall has been doing well since the surgery.  Pain well-controlled, tolerating a diet, and moving her bowels -Advised that she can use antibiotic ointment prior to showering to help loosen up the remaining skin glue -Discussed the results of her pathology -Follow up with me as needed  All of the above recommendations were discussed with the patient, and all of patient's questions were answered to  her expressed satisfaction.  Theophilus Kinds, DO [facility] [address] [place], [place] [address] [phone] (office)

## 2023-10-24 ENCOUNTER — Other Ambulatory Visit: Payer: Self-pay | Admitting: Internal Medicine

## 2023-11-04 ENCOUNTER — Other Ambulatory Visit: Payer: Self-pay | Admitting: Adult Health

## 2023-11-04 ENCOUNTER — Telehealth: Payer: Self-pay | Admitting: Adult Health

## 2023-11-04 DIAGNOSIS — G4700 Insomnia, unspecified: Secondary | ICD-10-CM

## 2023-11-04 NOTE — Telephone Encounter (Signed)
 She has a refill frm the pharmacy for her Ambien.  She made appt for Monday, 1/13.

## 2023-11-07 ENCOUNTER — Ambulatory Visit (INDEPENDENT_AMBULATORY_CARE_PROVIDER_SITE_OTHER): Payer: Self-pay | Admitting: Adult Health

## 2023-11-07 ENCOUNTER — Encounter: Payer: Self-pay | Admitting: Adult Health

## 2023-11-07 DIAGNOSIS — G4700 Insomnia, unspecified: Secondary | ICD-10-CM

## 2023-11-07 DIAGNOSIS — F411 Generalized anxiety disorder: Secondary | ICD-10-CM

## 2023-11-07 DIAGNOSIS — F4310 Post-traumatic stress disorder, unspecified: Secondary | ICD-10-CM

## 2023-11-07 DIAGNOSIS — F331 Major depressive disorder, recurrent, moderate: Secondary | ICD-10-CM

## 2023-11-07 MED ORDER — FLUOXETINE HCL 40 MG PO CAPS: 40.0000 mg | ORAL_CAPSULE | Freq: Two times a day (BID) | ORAL | 5 refills | Status: DC

## 2023-11-07 MED ORDER — ZOLPIDEM TARTRATE 10 MG PO TABS: 10.0000 mg | ORAL_TABLET | Freq: Every day | ORAL | 2 refills | Status: DC

## 2023-11-07 MED ORDER — ALPRAZOLAM 0.5 MG PO TABS: ORAL_TABLET | ORAL | 2 refills | Status: DC

## 2023-11-07 MED ORDER — BUPROPION HCL ER (XL) 150 MG PO TB24: 150.0000 mg | ORAL_TABLET | Freq: Every day | ORAL | 5 refills | Status: DC

## 2023-11-07 NOTE — Progress Notes (Signed)
 [name] [id number] 08-30-1966 58 y.o.  Subjective:   Patient ID:  [name] is a 58 y.o. (DOB [birth year]) female.  Chief Complaint: No chief complaint on file.   HPI [name] presents to the office today for follow-up of Bipolar 1 disorder, ADHD, GAD, PTSD, and insomnia.  Describes mood today as ok. Pleasant. Mood symptoms - reports some anxiety and uncertainty. Denies depression and irritability. Reports having panic attacks - more situational - a blend of everything. Reports some worry, rumination and over thinking - son in jail - mother not doing well. Mood is variable. Stating I don't feel like I'm doing too good right now. Feels like medications are helpful, but is willing to consider other options. Varying interest and motivation. Taking medications as prescribed.  Energy levels lower. Active, does not have a regular exercise routine.  Enjoys some usual interests and activities. Married. Lives with husband and 2 dogs - parrots. Has 2 grown children. Spending time with family. Appetite adequate. Weight loss - 125 from 143 pounds. Sleeping better some nights than others. 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 Focus and concentration could be better. Completing tasks. Managing aspects of household. Working at [employer] 12 hour shifts.  Denies SI or HI.  Denies AH or VH.  Denies self harm. Denies substance use.  Past trials of medication: Sertraline, Paxil, Fluoxetine , Lexapro, Wellbutrin , Abilify , Buspar    GAD-7    Flowsheet Row Office Visit from 06/17/2023 in [facility] Office Visit from 05/10/2023 in [facility] Office Visit from 11/15/2022 in [facility]  Total GAD-7 Score 7 4 2       PHQ2-9    Flowsheet Row Office Visit from 06/17/2023 in [facility] Office Visit from 05/10/2023 in [facility] Video Visit from 03/10/2023 in [facility] Office Visit from 11/15/2022 in [facility] Office Visit from 07/16/2022 in [facility]  PHQ-2 Total Score 2 2 0 1 0  PHQ-9 Total Score 5 4 -- 5 --      Flowsheet Row ED to Hosp-Admission (Discharged) from 09/30/2023 in [facility] [place] [facility] ED to Hosp-Admission (Discharged) from 09/24/2023 in [facility] ED to Hosp-Admission (Discharged) from 04/23/2021 in [facility] [facility] Progressive [facility]  C-SSRS RISK CATEGORY No Risk No Risk No Risk        Review of Systems:  Review of Systems  Musculoskeletal:  Negative for gait problem.  Neurological:  Negative for tremors.  Psychiatric/Behavioral:         Please refer to HPI    Medications: I have reviewed the patient's current medications.  Current Outpatient Medications  Medication Sig Dispense Refill   albuterol  (VENTOLIN  HFA) 108 (90 Base) MCG/ACT inhaler Inhale 2 puffs into the lungs every 6 (six) hours as needed for wheezing or shortness of breath.     ALPRAZolam  (XANAX ) 0.5 MG tablet TAKE 1 TABLET BY MOUTH THREE TIMES DAILY AS NEEDED (Patient taking differently: Take 0.5 mg by mouth 3 (three) times daily as needed for anxiety.) 90 tablet 5   aspirin  EC 81 MG tablet Take 1 tablet (81 mg total) by mouth daily. 30 tablet 11   atorvastatin  (LIPITOR) 80 MG tablet Take 1 tablet (80 mg total) by mouth daily. 90 tablet 0   buPROPion  (WELLBUTRIN  XL) 150 MG 24 hr tablet TAKE 1 TABLET BY MOUTH  DAILY 30 tablet 3   Cholecalciferol  (VITAMIN D3 PO) Take 1 tablet by mouth daily.     esomeprazole  (NEXIUM ) 40 MG capsule TAKE ONE CAPSULE BY MOUTH DAILY 30 capsule 0   Evolocumab  (REPATHA  SURECLICK) 140 MG/ML SOAJ Inject 140 mg into the skin every 14 (fourteen) days. 6 mL 1   FLUoxetine  (PROZAC ) 40 MG capsule Take 1 capsule (40 mg total) by mouth 2 (two) times daily. (Patient taking differently: Take 80 mg by mouth at bedtime.) 60 capsule 5   fluticasone   (FLONASE ) 50 MCG/ACT nasal spray Place 2 sprays into both nostrils daily. (Patient taking differently: Place 2 sprays into both nostrils every morning.) 16 g 6   furosemide  (LASIX ) 40 MG tablet Take 1 tablet (40 mg total) by mouth daily. (Patient taking differently: Take 40 mg by mouth daily as needed for fluid.) 90 tablet 3   losartan  (COZAAR ) 50 MG tablet Take 1 tablet (50 mg total) by mouth daily. 90 tablet 0   Multiple Vitamins-Minerals (CENTRUM SILVER PO) Take 1 tablet by mouth daily.     ondansetron  (ZOFRAN ) 4 MG tablet Take 1 tablet (4 mg total) by mouth daily as needed for nausea or vomiting. 30 tablet 1   oxyCODONE  (ROXICODONE ) 5 MG immediate release tablet Take 1 tablet (5 mg total) by mouth every 6 (six) hours as needed. (Patient taking differently: Take 5 mg by mouth every 6 (six) hours as needed for moderate pain (pain score 4-6) or severe pain (pain score 7-10).) 8 tablet 0   vitamin B-12 (CYANOCOBALAMIN ) 100 MCG tablet Take 100 mcg by mouth daily.     zolpidem  (AMBIEN ) 10 MG tablet TAKE 1 TABLET BY MOUTH AT BEDTIME 30 tablet 2   No current facility-administered medications for this visit.    Medication Side Effects: None  Allergies:  Allergies  Allergen Reactions   Isosorbide  Nitrate Other (See Comments)    Headaches; Worsening Chest Pain    Past Medical History:  Diagnosis Date   ADD (attention deficit disorder with hyperactivity)    Anxiety    Bipolar 1 disorder (HCC)    Carotid artery disease (HCC)    Right 50-69% stenosis by doppler 2010   Chest pain 04/23/2021   Chronic diastolic (congestive) heart failure (HCC)    COPD (chronic obstructive pulmonary disease) (HCC)    Phreesia 12/15/2020   Coronary artery disease    a. s/p CABG in 2009 with LIMA-LAD, SVG-RI, and SVG-RCA b. low-risk NST in 10/2015 c. low-risk NST in 12/2018.   CVA (cerebral infarction) 12/2008   Reports a neurologist told her she had a blood clot in her brain   Depression    Depression     Phreesia 12/15/2020   Emphysema of lung (HCC) 06/19/2018   Fibromyalgia    GERD (gastroesophageal reflux disease)    Hyperlipidemia    Hypertension    Myocardial infarction (HCC) 09/18/2011   Phreesia 12/15/2020   OSA on CPAP 12/31/2018   Preventative health care 07/16/2022   Sleep apnea    Phreesia 12/15/2020   Stroke [facility])    Phreesia 12/15/2020   Tobacco abuse    Upper respiratory tract infection 11/09/2021    Past Medical History, Surgical history, Social history, and Family history were reviewed and updated as appropriate.   Please see review of systems for further details on the patient's review from today.   Objective:   Physical Exam:  LMP 10/22/2015   Physical Exam Constitutional:      General: She is not  in acute distress. Musculoskeletal:        General: No deformity.  Neurological:     Mental Status: She is alert and oriented to person, place, and time.     Coordination: Coordination normal.  Psychiatric:        Attention and Perception: Attention and perception normal. She does not perceive auditory or visual hallucinations.        Mood and Affect: Affect is not labile, blunt, angry or inappropriate.        Speech: Speech normal.        Behavior: Behavior normal.        Thought Content: Thought content normal. Thought content is not paranoid or delusional. Thought content does not include homicidal or suicidal ideation. Thought content does not include homicidal or suicidal plan.        Cognition and Memory: Cognition and memory normal.        Judgment: Judgment normal.     Comments: Insight intact     Lab Review:     Component Value Date/Time   NA 137 10/03/2023 0228   NA 137 07/16/2022 0953   K 4.6 10/03/2023 0228   CL 103 10/03/2023 0228   CO2 26 10/03/2023 0228   GLUCOSE 107 (H) 10/03/2023 0228   BUN 10 10/03/2023 0228   BUN 10 07/16/2022 0953   CREATININE 0.76 10/03/2023 0228   CALCIUM  9.0 10/03/2023 0228   PROT 6.9 09/30/2023 2006   PROT  8.1 07/16/2022 0953   ALBUMIN 3.6 09/30/2023 2006   ALBUMIN 4.9 07/16/2022 0953   AST 160 (H) 09/30/2023 2006   ALT 89 (H) 09/30/2023 2006   ALKPHOS 189 (H) 09/30/2023 2006   BILITOT 0.6 09/30/2023 2006   BILITOT 0.2 07/16/2022 0953   GFRNONAA >60 10/03/2023 0228   GFRAA >60 10/07/2019 1040       Component Value Date/Time   WBC 6.4 10/03/2023 0229   RBC 4.25 10/03/2023 0229   HGB 12.6 10/03/2023 0229   HGB 12.5 07/16/2022 0953   HCT 38.5 10/03/2023 0229   HCT 38.7 07/16/2022 0953   PLT 382 10/03/2023 0229   PLT 378 07/16/2022 0953   MCV 90.6 10/03/2023 0229   MCV 86 07/16/2022 0953   MCH 29.6 10/03/2023 0229   MCHC 32.7 10/03/2023 0229   RDW 13.5 10/03/2023 0229   RDW 12.2 07/16/2022 0953   LYMPHSABS 4.4 (H) 07/16/2022 0953   MONOABS 0.4 12/01/2020 1324   EOSABS 0.1 07/16/2022 0953   BASOSABS 0.1 07/16/2022 0953    No results found for: POCLITH, LITHIUM   No results found for: PHENYTOIN, PHENOBARB, VALPROATE, CBMZ   .res Assessment: Plan:    There are no diagnoses linked to this encounter.   Plan:  PDMP reviewed  Wellbutrin  XL 150mg  - denies seizure history Fluoxetine  80 mg daily  Ambien  10mg  as needed Increase Xanax  0.5mg  TID to 4 x daily prn for increased anxiety.   Has seen a therapist in the past  RTC 3 months  Patient advised to contact office with any questions, adverse effects, or acute worsening in signs and symptoms.  Discussed potential benefits, risk, and side effects of benzodiazepines to include potential risk of tolerance and dependence, as well as possible drowsiness.  Advised patient not to drive if experiencing drowsiness and to take lowest possible effective dose to minimize risk of dependence and tolerance.  Discussed potential metabolic side effects associated with atypical antipsychotics, as well as potential risk for movement side effects. Advised pt to  contact office if movement side effects occur. ease see After Visit  Summary for patient specific instructions.  Future Appointments  Date Time Provider Department Center  11/07/2023 11:30 AM Kyani Simkin, Angeline Mattocks, NP [facility] None  12/21/2023 11:00 AM Alvan Dorn FALCON, MD [id number]    No orders of the defined types were placed in this encounter.   -------------------------------

## 2023-12-19 ENCOUNTER — Encounter: Payer: Self-pay | Admitting: Internal Medicine

## 2023-12-19 ENCOUNTER — Ambulatory Visit: Payer: BC Managed Care – PPO | Admitting: Internal Medicine

## 2023-12-19 VITALS — BP 130/80 | HR 72 | Ht 62.0 in | Wt 126.2 lb

## 2023-12-19 DIAGNOSIS — U071 COVID-19: Secondary | ICD-10-CM

## 2023-12-19 DIAGNOSIS — R6889 Other general symptoms and signs: Secondary | ICD-10-CM

## 2023-12-19 DIAGNOSIS — J069 Acute upper respiratory infection, unspecified: Secondary | ICD-10-CM

## 2023-12-19 MED ORDER — PROMETHAZINE-DM 6.25-15 MG/5ML PO SYRP: 5.0000 mL | ORAL_SOLUTION | Freq: Four times a day (QID) | ORAL | 0 refills | Status: AC | PRN

## 2023-12-19 MED ORDER — FLUTICASONE PROPIONATE 50 MCG/ACT NA SUSP: 2.0000 | Freq: Every day | NASAL | 1 refills | Status: DC

## 2023-12-19 NOTE — Progress Notes (Signed)
 Acute Office Visit  Subjective:    Patient ID: [name], female    DOB: 02/13/1966, 58 y.o.   MRN: [id number]  Chief Complaint  Patient presents with   Nasal Congestion    Pt reports sx of nasl congestion and sore throat, has body aches, and headache. Sx started on 12/17/23.    HPI Patient is in today for, complaint of nasal congestion, postnasal drip, sore throat and myalgias for the last 2 days.  She also reports sinus pressure related headache.  She has tried taking NyQuil with mild relief.  Denies any fever or chills.  Denies any dyspnea or wheezing.  Past Medical History:  Diagnosis Date   ADD (attention deficit disorder with hyperactivity)    Anxiety    Bipolar 1 disorder (HCC)    Carotid artery disease (HCC)    Right 50-69% stenosis by doppler 2010   Chest pain 04/23/2021   Chronic diastolic (congestive) heart failure (HCC)    COPD (chronic obstructive pulmonary disease) (HCC)    Phreesia 12/15/2020   Coronary artery disease    a. s/p CABG in 2009 with LIMA-LAD, SVG-RI, and SVG-RCA b. low-risk NST in 10/2015 c. low-risk NST in 12/2018.   CVA (cerebral infarction) 12/2008   Reports a neurologist told her she had a blood clot in her brain   Depression    Depression    Phreesia 12/15/2020   Emphysema of lung (HCC) 06/19/2018   Fibromyalgia    GERD (gastroesophageal reflux disease)    Hyperlipidemia    Hypertension    Myocardial infarction (HCC) 09/18/2011   Phreesia 12/15/2020   OSA on CPAP 12/31/2018   Preventative health care 07/16/2022   Sleep apnea    Phreesia 12/15/2020   Stroke (HCC)    Phreesia 12/15/2020   Tobacco abuse    Upper respiratory tract infection 11/09/2021    Past Surgical History:  Procedure Laterality Date   CARDIAC CATHETERIZATION     CORONARY ARTERY BYPASS GRAFT  2009   3V CABG LIMA --> LAD, SVG --> Ramus Intermediate,  SVG --> RCA   LEFT HEART CATH AND CORS/GRAFTS ANGIOGRAPHY N/A 04/24/2021   Procedure: LEFT HEART CATH AND  CORS/GRAFTS ANGIOGRAPHY;  Surgeon: Kathleene Hazel, MD;  Location: [facility];  Service: Cardiovascular;  Laterality: N/A;   LEFT HEART CATH AND CORS/GRAFTS ANGIOGRAPHY N/A 10/03/2023   Procedure: LEFT HEART CATH AND CORS/GRAFTS ANGIOGRAPHY;  Surgeon: Swaziland, Peter M, MD;  [facility]: [facility];  Service: Cardiovascular;  Laterality: N/A;   TUBAL LIGATION  1990    Family History  Problem Relation Age of Onset   CAD Mother    Hypertension Mother    Stroke Mother    Heart disease Father    Diabetes type II Father    Intellectual disability Maternal Aunt    Intellectual disability Maternal Uncle    Alcohol abuse Paternal Grandfather    Colon cancer Neg Hx    Breast cancer Neg Hx    Lung cancer Neg Hx     Social History   Socioeconomic History   Marital status: Married    Spouse name: Not on file   Number of children: 2   Years of education: Not on file   Highest education level: Not on file  Occupational History   Occupation: [employer]    Comment: Investment banker, operational- in Set designer  Tobacco Use   Smoking status: Former    Current packs/day: 0.00    Average packs/day:  1 pack/day for 38.0 years (38.0 ttl pk-yrs)    Types: Cigarettes    Start date: 12/14/1981    Quit date: 12/17/2018    Years since quitting: 5.0   Smokeless tobacco: Never  Vaping Use   Vaping status: Never Used  Substance and Sexual Activity   Alcohol use: Not Currently    Comment: rarely- had a margarita on birthday   Drug use: No   Sexual activity: Yes    Birth control/protection: Post-menopausal  Other Topics Concern   Not on file  Social History Narrative   Married with 2 children who are grown; has pets   Social Drivers of Corporate investment banker Strain: Not on file  Food Insecurity: No Food Insecurity (10/01/2023)   Hunger Vital Sign    Worried About Running Out of Food in the Last Year: Never true    Ran Out of Food in the Last Year: Never true   Transportation Needs: No Transportation Needs (10/03/2023)   PRAPARE - Administrator, Civil Service (Medical): No    Lack of Transportation (Non-Medical): No  Physical Activity: Not on file  Stress: Not on file  Social Connections: Not on file  Intimate Partner Violence: Not At Risk (10/03/2023)   Humiliation, Afraid, Rape, and Kick questionnaire    Fear of Current or Ex-Partner: No    Emotionally Abused: No    Physically Abused: No    Sexually Abused: No    Outpatient Medications Prior to Visit  Medication Sig Dispense Refill   albuterol (VENTOLIN HFA) 108 (90 Base) MCG/ACT inhaler Inhale 2 puffs into the lungs every 6 (six) hours as needed for wheezing or shortness of breath.     ALPRAZolam (XANAX) 0.5 MG tablet Take one tablet four times daily as needed for anxiety. 120 tablet 2   aspirin EC 81 MG tablet Take 1 tablet (81 mg total) by mouth daily. 30 tablet 11   atorvastatin (LIPITOR) 80 MG tablet Take 1 tablet (80 mg total) by mouth daily. 90 tablet 0   buPROPion (WELLBUTRIN XL) 150 MG 24 hr tablet Take 1 tablet (150 mg total) by mouth daily. 30 tablet 5   Cholecalciferol (VITAMIN D3 PO) Take 1 tablet by mouth daily.     esomeprazole (NEXIUM) 40 MG capsule TAKE ONE CAPSULE BY MOUTH DAILY 30 capsule 0   Evolocumab (REPATHA SURECLICK) 140 MG/ML SOAJ Inject 140 mg into the skin every 14 (fourteen) days. 6 mL 1   FLUoxetine (PROZAC) 40 MG capsule Take 1 capsule (40 mg total) by mouth 2 (two) times daily. 60 capsule 5   furosemide (LASIX) 40 MG tablet Take 1 tablet (40 mg total) by mouth daily. (Patient taking differently: Take 40 mg by mouth daily as needed for fluid.) 90 tablet 3   losartan (COZAAR) 50 MG tablet Take 1 tablet (50 mg total) by mouth daily. 90 tablet 0   Multiple Vitamins-Minerals (CENTRUM SILVER PO) Take 1 tablet by mouth daily.     ondansetron (ZOFRAN) 4 MG tablet Take 1 tablet (4 mg total) by mouth daily as needed for nausea or vomiting. 30 tablet 1    vitamin B-12 (CYANOCOBALAMIN) 100 MCG tablet Take 100 mcg by mouth daily.     zolpidem (AMBIEN) 10 MG tablet Take 1 tablet (10 mg total) by mouth at bedtime. 30 tablet 2   fluticasone (FLONASE) 50 MCG/ACT nasal spray Place 2 sprays into both nostrils daily. (Patient taking differently: Place 2 sprays into both nostrils every morning.)  16 g 6   No facility-administered medications prior to visit.    Allergies  Allergen Reactions   Isosorbide Nitrate Other (See Comments)    Headaches; Worsening Chest Pain    Review of Systems  Constitutional:  Positive for fatigue. Negative for chills and fever.  HENT:  Positive for congestion, sinus pressure and sore throat.   Eyes:  Negative for pain and discharge.  Respiratory:  Positive for cough. Negative for shortness of breath.   Cardiovascular:  Negative for chest pain and palpitations.  Gastrointestinal:  Negative for abdominal pain, diarrhea, nausea and vomiting.  Endocrine: Negative for polydipsia and polyuria.  Genitourinary:  Negative for dysuria and hematuria.  Musculoskeletal:  Negative for neck pain and neck stiffness.  Skin:  Negative for rash.  Neurological:  Positive for headaches. Negative for dizziness and weakness.  Psychiatric/Behavioral:  Negative for agitation and behavioral problems.        Objective:    Physical Exam Vitals reviewed.  Constitutional:      General: She is not in acute distress.    Appearance: She is not diaphoretic.  HENT:     Head: Normocephalic and atraumatic.     Nose: Congestion present.     Mouth/Throat:     Mouth: Mucous membranes are moist.     Pharynx: Posterior oropharyngeal erythema present.  Eyes:     General: No scleral icterus.    Extraocular Movements: Extraocular movements intact.  Cardiovascular:     Rate and Rhythm: Normal rate and regular rhythm.     Heart sounds: Normal heart sounds. No murmur heard. Pulmonary:     Breath sounds: Normal breath sounds. No wheezing or rales.   Musculoskeletal:     Right lower leg: No edema.     Left lower leg: No edema.  Skin:    General: Skin is warm.     Findings: No rash.  Neurological:     General: No focal deficit present.     Mental Status: She is alert and oriented to person, place, and time.  Psychiatric:        Mood and Affect: Mood normal.        Behavior: Behavior normal.     BP 130/80 (BP Location: Left Arm)   Pulse 72   Ht 5\' 2"  (1.575 m)   Wt 126 lb 3.2 oz (57.2 kg)   LMP 10/22/2015   SpO2 97%   BMI 23.08 kg/m  Wt Readings from Last 3 Encounters:  12/19/23 126 lb 3.2 oz (57.2 kg)  10/12/23 120 lb (54.4 kg)  09/30/23 120 lb 13 oz (54.8 kg)        Assessment & Plan:   Problem List Items Addressed This Visit       Respiratory   URTI (acute upper respiratory infection) - Primary   Rapid flu negative today Check COVID RT-PCR Continue symptomatic treatment for now -promethazine DM syrup as needed for cough, Flonase for nasal congestion Maintain adequate hydration If negative viral testing and persistent symptoms, will start antibiotic      Relevant Medications   fluticasone (FLONASE) 50 MCG/ACT nasal spray   promethazine-dextromethorphan (PROMETHAZINE-DM) 6.25-15 MG/5ML syrup   Other Relevant Orders   Veritor Flu A/B Waived   COVID-19, Flu A+B and RSV   Other Visit Diagnoses       Flu-like symptoms       Relevant Orders   COVID-19, Flu A+B and RSV        Meds ordered this encounter  Medications  fluticasone (FLONASE) 50 MCG/ACT nasal spray    Sig: Place 2 sprays into both nostrils daily.    Dispense:  16 g    Refill:  1   promethazine-dextromethorphan (PROMETHAZINE-DM) 6.25-15 MG/5ML syrup    Sig: Take 5 mLs by mouth 4 (four) times daily as needed.    Dispense:  118 mL    Refill:  0     Dorothye Berni Concha Se, MD

## 2023-12-19 NOTE — Assessment & Plan Note (Signed)
 Rapid flu negative today Check COVID RT-PCR Continue symptomatic treatment for now -promethazine DM syrup as needed for cough, Flonase for nasal congestion Maintain adequate hydration If negative viral testing and persistent symptoms, will start antibiotic

## 2023-12-20 ENCOUNTER — Ambulatory Visit: Payer: BC Managed Care – PPO | Admitting: Cardiology

## 2023-12-20 LAB — VERITOR FLU A/B WAIVED
Influenza A: NEGATIVE
Influenza B: NEGATIVE

## 2023-12-21 ENCOUNTER — Ambulatory Visit: Payer: BC Managed Care – PPO | Admitting: Cardiology

## 2023-12-21 ENCOUNTER — Encounter: Payer: Self-pay | Admitting: Internal Medicine

## 2023-12-21 LAB — COVID-19, FLU A+B AND RSV
Influenza A, NAA: NOT DETECTED
Influenza B, NAA: NOT DETECTED
RSV, NAA: NOT DETECTED
SARS-CoV-2, NAA: DETECTED — AB

## 2023-12-21 MED ORDER — NIRMATRELVIR/RITONAVIR (PAXLOVID)TABLET: 3.0000 | ORAL_TABLET | Freq: Two times a day (BID) | ORAL | 0 refills | Status: AC

## 2023-12-21 NOTE — Addendum Note (Signed)
 Addended byTrena Platt on: 12/21/2023 08:04 AM   Modules accepted: Orders

## 2023-12-29 ENCOUNTER — Ambulatory Visit: Payer: BC Managed Care – PPO | Admitting: Cardiology

## 2024-01-21 ENCOUNTER — Other Ambulatory Visit (HOSPITAL_COMMUNITY): Payer: Self-pay

## 2024-01-21 ENCOUNTER — Other Ambulatory Visit (HOSPITAL_BASED_OUTPATIENT_CLINIC_OR_DEPARTMENT_OTHER): Payer: Self-pay

## 2024-01-23 ENCOUNTER — Other Ambulatory Visit (HOSPITAL_COMMUNITY): Payer: Self-pay

## 2024-01-26 ENCOUNTER — Other Ambulatory Visit: Payer: Self-pay | Admitting: Internal Medicine

## 2024-01-27 ENCOUNTER — Other Ambulatory Visit (HOSPITAL_COMMUNITY): Payer: Self-pay

## 2024-01-27 ENCOUNTER — Other Ambulatory Visit: Payer: Self-pay

## 2024-01-27 MED ORDER — ESOMEPRAZOLE MAGNESIUM 40 MG PO CPDR
40.0000 mg | DELAYED_RELEASE_CAPSULE | Freq: Every day | ORAL | 0 refills | Status: AC
  Filled 2024-01-27: qty 30, 30d supply, fill #0

## 2024-01-28 ENCOUNTER — Other Ambulatory Visit: Payer: Self-pay | Admitting: Adult Health

## 2024-01-28 DIAGNOSIS — G4700 Insomnia, unspecified: Secondary | ICD-10-CM

## 2024-01-31 ENCOUNTER — Other Ambulatory Visit: Payer: Self-pay | Admitting: Internal Medicine

## 2024-02-01 ENCOUNTER — Other Ambulatory Visit: Payer: Self-pay

## 2024-02-06 ENCOUNTER — Ambulatory Visit (INDEPENDENT_AMBULATORY_CARE_PROVIDER_SITE_OTHER): Payer: Self-pay | Admitting: Adult Health

## 2024-02-28 ENCOUNTER — Ambulatory Visit: Attending: Cardiology | Admitting: Cardiology

## 2024-02-28 NOTE — Progress Notes (Deleted)
 Clinical Summary Ms. Deleon is a 58 y.o.female  seen today for follow up of the following medical problems.    1. CAD - prior CABG in 2009 with LIMA-LAD, SVG-RI, SVG-RCA - 12/2018 echo LVEF 60-65%, no significant abnormalities - 12/2018 nuclear stress: no ischemia -not on beta blockers due to bradycardia. Intolerant to imdur       04/2021 echo LVEF 65-70%, no WMAs  04/2021 cath: patent LAD, LCX. RCA is CTO. Atretic LIMA-LAD, patent SVG-RCA graft. Chronic occluded SVG-OM1   -no significant chest pains. No SOB/DOE - compliant with meds    - admit 09/2023 with NSTEMI. ACS vs demand ischemia from recent gallbladder surgery - 09/2023 cath:  Normal LAD, LCX patent, RCA CTO. LIMA-LAD atretic, SVG-RCA nomral, SVG-OM1 CTO. Stable anatomy from 2022 cath 09/2023 echo: LVEF 50-55%, Septal basal anterior wall hypokinesis      2. Palpitations - some palpitations at times - episodes 4-5 times a week, lasts a few minutes - no specific trigger, but often with resting.  - 1 cup of coffee daily, limited sodas       3. Chronic diastolic HF - no SOB/DOE, no LE edema - takes lasix  40mg  daily.   09/2023 echo: LVEF 50-55%, Septal basal anterior wall hypokinesis    4. COPD - followed by Dr Waymond Hailey   5. HTN - she is compliantw ith meds   6. Hyperlipidemia - she is on statin - Jan 2023 TC 235 TG 221 HDL 50 LDL 145 - stopped crestor , was causing joint pains.  - reports has tried multiple statins, all caused muscle aches   - seen in lipid clinic - started on repatha  Jan 2023 TC 235 TG221 LDL 145    - compliant with repatha  - has not had repeat lipid panel   7. OSA on cpap - not using cpap   - last visit referred to pulmonary, has not seen yet.        SH: works at [employer], works 3rd shift Past Medical History:  Diagnosis Date   ADD (attention deficit disorder with hyperactivity)    Anxiety    Bipolar 1 disorder (HCC)    Carotid artery disease (HCC)    Right 50-69% stenosis  by doppler 2010   Chest pain 04/23/2021   Chronic diastolic (congestive) heart failure (HCC)    COPD (chronic obstructive pulmonary disease) (HCC)    Phreesia 12/15/2020   Coronary artery disease    a. s/p CABG in 2009 with LIMA-LAD, SVG-RI, and SVG-RCA b. low-risk NST in 10/2015 c. low-risk NST in 12/2018.   CVA (cerebral infarction) 12/2008   Reports a neurologist told her she had a blood clot in her brain   Depression    Depression    Phreesia 12/15/2020   Emphysema of lung (HCC) 06/19/2018   Fibromyalgia    GERD (gastroesophageal reflux disease)    Hyperlipidemia    Hypertension    Myocardial infarction (HCC) 09/18/2011   Phreesia 12/15/2020   OSA on CPAP 12/31/2018   Preventative health care 07/16/2022   Sleep apnea    Phreesia 12/15/2020   Stroke (HCC)    Phreesia 12/15/2020   Tobacco abuse    Upper respiratory tract infection 11/09/2021     Allergies  Allergen Reactions   Isosorbide  Nitrate Other (See Comments)    Headaches; Worsening Chest Pain     Current Outpatient Medications  Medication Sig Dispense Refill   albuterol  (VENTOLIN  HFA) 108 (90 Base) MCG/ACT inhaler Inhale 2 puffs into  the lungs every 6 (six) hours as needed for wheezing or shortness of breath.     ALPRAZolam  (XANAX ) 0.5 MG tablet Take one tablet four times daily as needed for anxiety. 120 tablet 2   aspirin  EC 81 MG tablet Take 1 tablet (81 mg total) by mouth daily. 30 tablet 11   atorvastatin  (LIPITOR) 80 MG tablet Take 1 tablet (80 mg total) by mouth daily. 90 tablet 0   buPROPion  (WELLBUTRIN  XL) 150 MG 24 hr tablet Take 1 tablet (150 mg total) by mouth daily. 30 tablet 5   Cholecalciferol  (VITAMIN D3 PO) Take 1 tablet by mouth daily.     esomeprazole  (NEXIUM ) 40 MG capsule Take 1 capsule (40 mg total) by mouth daily. 30 capsule 0   Evolocumab  (REPATHA  SURECLICK) 140 MG/ML SOAJ Inject 140 mg into the skin every 14 (fourteen) days. 6 mL 1   FLUoxetine  (PROZAC ) 40 MG capsule Take 1 capsule (40  mg total) by mouth 2 (two) times daily. 60 capsule 5   fluticasone  (FLONASE ) 50 MCG/ACT nasal spray Place 2 sprays into both nostrils daily. 16 g 1   furosemide  (LASIX ) 40 MG tablet Take 1 tablet (40 mg total) by mouth daily. (Patient taking differently: Take 40 mg by mouth daily as needed for fluid.) 90 tablet 3   losartan  (COZAAR ) 50 MG tablet Take 1 tablet (50 mg total) by mouth daily. 90 tablet 0   Multiple Vitamins-Minerals (CENTRUM SILVER PO) Take 1 tablet by mouth daily.     ondansetron  (ZOFRAN ) 4 MG tablet Take 1 tablet (4 mg total) by mouth daily as needed for nausea or vomiting. 30 tablet 1   promethazine -dextromethorphan (PROMETHAZINE -DM) 6.25-15 MG/5ML syrup Take 5 mLs by mouth 4 (four) times daily as needed. 118 mL 0   vitamin B-12 (CYANOCOBALAMIN ) 100 MCG tablet Take 100 mcg by mouth daily.     zolpidem  (AMBIEN ) 10 MG tablet Take 1 tablet (10 mg total) by mouth at bedtime. 30 tablet 2   No current facility-administered medications for this visit.     Past Surgical History:  Procedure Laterality Date   CARDIAC CATHETERIZATION     CORONARY ARTERY BYPASS GRAFT  2009   3V CABG LIMA --> LAD, SVG --> Ramus Intermediate,  SVG --> RCA   LEFT HEART CATH AND CORS/GRAFTS ANGIOGRAPHY N/A 04/24/2021   Procedure: LEFT HEART CATH AND CORS/GRAFTS ANGIOGRAPHY;  Surgeon: Odie Benne, MD;  Location: [facility] INVASIVE CV LAB;  Service: Cardiovascular;  Laterality: N/A;   LEFT HEART CATH AND CORS/GRAFTS ANGIOGRAPHY N/A 10/03/2023   Procedure: LEFT HEART CATH AND CORS/GRAFTS ANGIOGRAPHY;  Surgeon: Swaziland, Peter M, MD;  Location: [facility] INVASIVE CV LAB;  Service: Cardiovascular;  Laterality: N/A;   TUBAL LIGATION  1990     Allergies  Allergen Reactions   Isosorbide  Nitrate Other (See Comments)    Headaches; Worsening Chest Pain      Family History  Problem Relation Age of Onset   CAD Mother    Hypertension Mother    Stroke Mother    Heart disease Father    Diabetes type II Father     Intellectual disability Maternal Aunt    Intellectual disability Maternal Uncle    Alcohol abuse Paternal Grandfather    Colon cancer Neg Hx    Breast cancer Neg Hx    Lung cancer Neg Hx      Social History Ms. Cozza reports that she quit smoking about 5 years ago. Her smoking use included cigarettes. She started smoking about  42 years ago. She has a 38 pack-year smoking history. She has never used smokeless tobacco. Ms. Zieske reports that she does not currently use alcohol.   Review of Systems CONSTITUTIONAL: No weight loss, fever, chills, weakness or fatigue.  HEENT: Eyes: No visual loss, blurred vision, double vision or yellow sclerae.No hearing loss, sneezing, congestion, runny nose or sore throat.  SKIN: No rash or itching.  CARDIOVASCULAR:  RESPIRATORY: No shortness of breath, cough or sputum.  GASTROINTESTINAL: No anorexia, nausea, vomiting or diarrhea. No abdominal pain or blood.  GENITOURINARY: No burning on urination, no polyuria NEUROLOGICAL: No headache, dizziness, syncope, paralysis, ataxia, numbness or tingling in the extremities. No change in bowel or bladder control.  MUSCULOSKELETAL: No muscle, back pain, joint pain or stiffness.  LYMPHATICS: No enlarged nodes. No history of splenectomy.  PSYCHIATRIC: No history of depression or anxiety.  ENDOCRINOLOGIC: No reports of sweating, cold or heat intolerance. No polyuria or polydipsia.  Aaron Aas   Physical Examination There were no vitals filed for this visit. There were no vitals filed for this visit.  Gen: resting comfortably, no acute distress HEENT: no scleral icterus, pupils equal round and reactive, no palptable cervical adenopathy,  CV Resp: Clear to auscultation bilaterally GI: abdomen is soft, non-tender, non-distended, normal bowel sounds, no hepatosplenomegaly MSK: extremities are warm, no edema.  Skin: warm, no rash Neuro:  no focal deficits Psych: appropriate affect   Diagnostic  Studies  Echocardiogram: 12/2018  IMPRESSIONS      1. The left ventricle has normal systolic function with an ejection fraction of 60-65%. The cavity size was normal. Left ventricular diastolic parameters were normal No evidence of left ventricular regional wall motion abnormalities.  2. The mitral valve is normal in structure.  3. The tricuspid valve is normal in structure.  4. The aortic valve is tricuspid.  5. The aortic root is normal in size and structure.  6. No evidence of left ventricular regional wall motion abnormalities.   NST: 01/09/2019 Blood pressure demonstrated a hypertensive response to exercise. Horizontal ST segment depression ST segment depression of 0.5 mm was noted during recovery in the II, III, aVF, V5 and V6 leads. The patient was asymptomatic. The study is normal. No perfusion defects consistent with myocardial ischemia or scar. This is a low risk study. Nuclear stress EF: 71%.     04/2021 echo 1. Left ventricular ejection fraction, by estimation, is 65 to 70%. The  left ventricle has normal function. The left ventricle has no regional  wall motion abnormalities. Left ventricular diastolic parameters were  normal.   2. Right ventricular systolic function is normal. The right ventricular  size is mildly enlarged. Tricuspid regurgitation signal is inadequate for  assessing PA pressure.   3. The mitral valve is grossly normal. Trivial mitral valve  regurgitation.   4. The aortic valve is tricuspid. Aortic valve regurgitation is not  visualized. Aortic valve mean gradient measures 4.0 mmHg.   5. The inferior vena cava is normal in size with greater than 50%  respiratory variability, suggesting right atrial pressure of 3 mmHg.    Assessment and Plan   1. CAD - no symptoms, continue current meds       2. HTN -her bp is at goal, continue current meds   3.Hyperlipidemia - intolerant to statins, now on repatha .FOllowed in lipid clinic -we will repeat  lipid panel   4. Chronic HFpEF - euvolemic, continue current meds   5. OSA - cpap is uncomfortable, not wearing at home -  refer to pulmonary for evaluation.      Laurann Pollock, M.D.

## 2024-03-05 ENCOUNTER — Ambulatory Visit: Payer: Self-pay | Admitting: Adult Health

## 2024-03-07 ENCOUNTER — Other Ambulatory Visit: Payer: Self-pay

## 2024-03-07 DIAGNOSIS — Z87891 Personal history of nicotine dependence: Secondary | ICD-10-CM

## 2024-03-07 DIAGNOSIS — Z122 Encounter for screening for malignant neoplasm of respiratory organs: Secondary | ICD-10-CM

## 2024-03-26 NOTE — Progress Notes (Signed)
 I, Miquel Amen, CMA acting as a scribe for Garlan Juniper, MD.  [name] is a 58 y.o. female who presents to [facility] today for R shoulder pain x 6-7 months, repetitive movements at work. Pt locates pain to posterior aspect of the shoulder, deep. Pain radiates into the medial aspect of the elbow and 3rd and 4th digits. Notes weakness and decreased grip strength. Sx causing night disturbance. Pt is RHD.   Radiates: into the arm and fingers Aggravates: lateral raises, weighted lifting, overhead reaching Treatments tried: heat, cold, TENS, IBU, Tylenol   Pertinent review of systems: No fevers or chills  Relevant historical information: NSTEMI.  CAD.  History of CABG.   Exam:  BP 118/74   Pulse 65   Ht 5\' 2"  (1.575 m)   Wt 120 lb (54.4 kg)   LMP 10/22/2015   SpO2 98%   BMI 21.95 kg/m  General: Well Developed, well nourished, and in no acute distress.   MSK: Right shoulder normal-appearing Normal motion pain with abduction. Intact strength. Positive empty can test.  Positive Hawkins and Neer's test. Negative Yergason's and speeds test.    Lab and Radiology Results  Procedure: Real-time Ultrasound Guided Injection of right shoulder subacromial bursa Device: Philips Affiniti 50G/GE Logiq Images permanently stored and available for review in PACS Verbal informed consent obtained.  Discussed risks and benefits of procedure. Warned about infection, bleeding, hyperglycemia damage to structures among others. Patient expresses understanding and agreement Time-out conducted.   Noted no overlying erythema, induration, or other signs of local infection.   Skin prepped in a sterile fashion.   Local anesthesia: Topical Ethyl chloride.   With sterile technique and under real time ultrasound guidance: 40 mg of Kenalog and 2 mL of Marcaine  injected into subacromial bursa. Fluid seen entering the bursa.   Completed without difficulty   Pain  immediately resolved suggesting accurate placement of the medication.   Advised to call if fevers/chills, erythema, induration, drainage, or persistent bleeding.   Images permanently stored and available for review in the ultrasound unit.  Impression: Technically successful ultrasound guided injection.    X-ray images right shoulder obtained today personally and independently interpreted. No acute fractures.  No significant degenerative changes glenohumeral joint. Await formal radiology review     Assessment and Plan: 58 y.o. female with chronic right shoulder pain ongoing for about 6 months.  Patient had pretty good response to subacromial injection indicating that is the majority of her pain generator.  Plan for injection as above and physical therapy.  Reassess in 6 weeks.  X-ray obtained today.   PDMP not reviewed this encounter. Orders Placed This Encounter  Procedures   US  LIMITED JOINT SPACE STRUCTURES UP RIGHT(NO LINKED CHARGES)    Reason for Exam (SYMPTOM  OR DIAGNOSIS REQUIRED):   right shoulder pain    Preferred imaging location?:   [facility] Shoulder Right    Standing Status:   Future    Number of Occurrences:   1    Expiration Date:   03/27/2025    Reason for Exam (SYMPTOM  OR DIAGNOSIS REQUIRED):   right shoulder pain    Preferred imaging location?:   [facility]    Is patient pregnant?:   No   Ambulatory referral to Physical Therapy    Referral Priority:   Routine    Referral Type:   Physical Medicine    Referral Reason:   Specialty Services Required  Requested Specialty:   Physical Therapy    Number of Visits Requested:   1   No orders of the defined types were placed in this encounter.    Discussed warning signs or symptoms. Please see discharge instructions. Patient expresses understanding.   The above documentation has been reviewed and is accurate and complete Garlan Juniper, M.D.

## 2024-03-27 ENCOUNTER — Ambulatory Visit (INDEPENDENT_AMBULATORY_CARE_PROVIDER_SITE_OTHER): Admitting: Family Medicine

## 2024-03-27 ENCOUNTER — Ambulatory Visit: Payer: Self-pay | Admitting: Family Medicine

## 2024-03-27 ENCOUNTER — Encounter: Payer: Self-pay | Admitting: Family Medicine

## 2024-03-27 ENCOUNTER — Other Ambulatory Visit: Payer: Self-pay

## 2024-03-27 ENCOUNTER — Ambulatory Visit (INDEPENDENT_AMBULATORY_CARE_PROVIDER_SITE_OTHER)

## 2024-03-27 VITALS — BP 118/74 | HR 65 | Ht 62.0 in | Wt 120.0 lb

## 2024-03-27 DIAGNOSIS — M25511 Pain in right shoulder: Secondary | ICD-10-CM

## 2024-03-27 DIAGNOSIS — G8929 Other chronic pain: Secondary | ICD-10-CM | POA: Diagnosis not present

## 2024-03-27 NOTE — Patient Instructions (Addendum)
 Thank you for coming in today.   You received an injection today. Seek immediate medical attention if the joint becomes red, extremely painful, or is oozing fluid.   Carpal tunnel wrist brace  Check back in 6 weeks  I've referred you to Physical Therapy.  Let us  know if you don't hear from them in one week.   Please get an Xray today before you leave

## 2024-03-27 NOTE — Progress Notes (Signed)
Right shoulder x-ray shows mild arthritis

## 2024-03-28 ENCOUNTER — Encounter: Payer: Self-pay | Admitting: Family Medicine

## 2024-03-29 MED ORDER — MELOXICAM 15 MG PO TABS: ORAL_TABLET | ORAL | 3 refills | Status: AC

## 2024-04-06 ENCOUNTER — Other Ambulatory Visit: Payer: Self-pay

## 2024-04-06 ENCOUNTER — Ambulatory Visit: Admitting: Family Medicine

## 2024-04-06 VITALS — BP 142/78 | HR 47 | Ht 62.0 in | Wt 119.0 lb

## 2024-04-06 DIAGNOSIS — M79631 Pain in right forearm: Secondary | ICD-10-CM | POA: Diagnosis not present

## 2024-04-06 DIAGNOSIS — M65831 Other synovitis and tenosynovitis, right forearm: Secondary | ICD-10-CM | POA: Diagnosis not present

## 2024-04-06 DIAGNOSIS — M65839 Other synovitis and tenosynovitis, unspecified forearm: Secondary | ICD-10-CM | POA: Insufficient documentation

## 2024-04-06 NOTE — Progress Notes (Signed)
   Joanna Muck, PhD, LAT, ATC acting as a scribe for Garlan Juniper, MD.  [name] is a 58 y.o. female who presents to [facility] at [facility] today for R arm pain. Pt was previously seen by Dr. Alease Hunter on 03/27/24 for R shoulder pain  Today, pt c/o R forearm pain ongoing since Wednesday. She was cleaning rotors at work and that seems to be the cause of her pain. Pt locates pain to mid-to-distal forearm and into 2-3rd fingers. +swelling and +redness  She works at a [employer].   Radiates: yes Paresthesia: no Grip strength: decreased w/ pain Aggravates: gripping motions Treatments tried: ice, biofreeze, wrap  Pertinent review of systems: No fevers or chills  Relevant historical information: Heart failure.  Sleep apnea.  COPD.   Exam:  BP (!) 142/78   Pulse (!) 47   Ht 5' 2 (1.575 m)   Wt 119 lb (54 kg)   LMP 10/22/2015   SpO2 98%   BMI 21.77 kg/m  General: Well Developed, well nourished, and in no acute distress.   MSK: Right forearm some swelling at radial forearm distally.  Tender palpation at intersection area.  Nontender palpation at radial styloid. Pain present with thumb extension.  Positive Finkelstein's test.  Intact strength.    Lab and Radiology Results  Diagnostic Limited MSK Ultrasound of: Right wrist and forearm First dorsal compartment visualized with intact appearing tendon without tenosynovitis visible. Area of maximum tenderness is at the intersection area. Impression: Intersection syndrome      Assessment and Plan: 58 y.o. 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  Plan for thumb spica wrist brace Voltaren gel and meloxicam .  If not improving anticipate an injection in 1 to 2 weeks.  Work note provided today.   PDMP not reviewed this encounter. Orders Placed This Encounter  Procedures   US  LIMITED JOINT SPACE STRUCTURES UP RIGHT(NO LINKED CHARGES)    Reason for Exam (SYMPTOM  OR DIAGNOSIS REQUIRED):    right forearm pain    Preferred imaging location?:   [facility]   No orders of the defined types were placed in this encounter.    Discussed warning signs or symptoms. Please see discharge instructions. Patient expresses understanding.   The above documentation has been reviewed and is accurate and complete Garlan Juniper, M.D.

## 2024-04-06 NOTE — Patient Instructions (Addendum)
 Thank you for coming in today.   Wear the Thumb Spica wrist brace  Please use Voltaren gel (Generic Diclofenac Gel) up to 4x daily for pain as needed.  This is available over-the-counter as both the name brand Voltaren gel and the generic diclofenac gel.   Take meloxicam  you already have  Work note provided  Check back in a week or 2

## 2024-04-13 ENCOUNTER — Encounter: Payer: Self-pay | Admitting: Family Medicine

## 2024-04-13 ENCOUNTER — Encounter: Payer: Self-pay | Admitting: Adult Health

## 2024-04-13 ENCOUNTER — Ambulatory Visit: Admitting: Family Medicine

## 2024-04-13 ENCOUNTER — Ambulatory Visit

## 2024-04-13 ENCOUNTER — Ambulatory Visit: Payer: Self-pay | Admitting: Adult Health

## 2024-04-13 VITALS — HR 62 | Ht 62.0 in | Wt 117.0 lb

## 2024-04-13 DIAGNOSIS — M65831 Other synovitis and tenosynovitis, right forearm: Secondary | ICD-10-CM

## 2024-04-13 DIAGNOSIS — M5412 Radiculopathy, cervical region: Secondary | ICD-10-CM | POA: Diagnosis not present

## 2024-04-13 DIAGNOSIS — F331 Major depressive disorder, recurrent, moderate: Secondary | ICD-10-CM | POA: Diagnosis not present

## 2024-04-13 DIAGNOSIS — G4700 Insomnia, unspecified: Secondary | ICD-10-CM | POA: Diagnosis not present

## 2024-04-13 DIAGNOSIS — M79631 Pain in right forearm: Secondary | ICD-10-CM

## 2024-04-13 DIAGNOSIS — F411 Generalized anxiety disorder: Secondary | ICD-10-CM

## 2024-04-13 MED ORDER — FLUOXETINE HCL 40 MG PO CAPS: 40.0000 mg | ORAL_CAPSULE | Freq: Two times a day (BID) | ORAL | 5 refills | Status: DC

## 2024-04-13 MED ORDER — ALPRAZOLAM 0.5 MG PO TABS: ORAL_TABLET | ORAL | 2 refills | Status: DC

## 2024-04-13 MED ORDER — ZOLPIDEM TARTRATE 10 MG PO TABS: 10.0000 mg | ORAL_TABLET | Freq: Every day | ORAL | 2 refills | Status: DC

## 2024-04-13 MED ORDER — BUPROPION HCL ER (XL) 150 MG PO TB24: 150.0000 mg | ORAL_TABLET | Freq: Every day | ORAL | 5 refills | Status: DC

## 2024-04-13 MED ORDER — GABAPENTIN 300 MG PO CAPS: 300.0000 mg | ORAL_CAPSULE | Freq: Three times a day (TID) | ORAL | 2 refills | Status: DC | PRN

## 2024-04-13 MED ORDER — PREDNISONE 50 MG PO TABS
50.0000 mg | ORAL_TABLET | Freq: Every day | ORAL | 0 refills | Status: AC
Start: 2024-04-13 — End: 2024-04-18

## 2024-04-13 NOTE — Progress Notes (Unsigned)
   I, Miquel Amen, CMA acting as a scribe for Garlan Juniper, MD.  [name] is a 58 y.o. female who presents to [facility] at [facility] today for f/u R forearm pain thought to be do to intersection syndrome. Pt was last seen by Dr. Alease Hunter on 04/06/24 and was advised to use Voltaren gel, meloxicam , was placed in a thumb spica wrist brace. Work note provided.  Today, pt reports continued right wrist pain. Wearing brace today. Has been out of work d/t pain. Unable to work in the brace. Also notes throbbing pain in the left shoulder, even while at rest. Pain radiates from the neck into the lateral aspect of the shoulder. Also having radiating pain down the medial aspect of the arm to the elbow. Having n/t in the 3rd-5th digits. Some pain with neck ROM. Taking Meloxicam  daily with inadequate relief. Notes that the right wrist feels better while wearing the brace. Still has some swelling. Works 7 PM to 7 AM. Not eligible for [employer] through employer but is eligible for short-term disability.   Pertinent review of systems: ***  Relevant historical information: ***   Exam:  LMP 10/22/2015  General: Well Developed, well nourished, and in no acute distress.   MSK: ***    Lab and Radiology Results No results found for this or any previous visit (from the past 72 hours). No results found.     Assessment and Plan: 58 y.o. female with ***   PDMP not reviewed this encounter. No orders of the defined types were placed in this encounter.  No orders of the defined types were placed in this encounter.    Discussed warning signs or symptoms. Please see discharge instructions. Patient expresses understanding.   ***

## 2024-04-13 NOTE — Patient Instructions (Addendum)
 Thank you for coming in today.   Please get an Xray today before you leave   Rx's sent in for 5 day course of Prednisone  and for Gabapentin .   Remain out of work for 6 weeks. Have your employer send the forms for short-term disability to [phone]) [phone].  See you back in 1 week

## 2024-04-13 NOTE — Progress Notes (Signed)
 [name] [id number] January 02, 1966 58 y.o.  Subjective:   Patient ID:  [name] is a 58 y.o. (DOB 1966-01-01) female.  Chief Complaint: No chief complaint on file.   HPI [name] presents to the office today for follow-up of  Bipolar 1 disorder, ADHD, GAD, PTSD, and insomnia.  Describes mood today as ok. Pleasant. Mood symptoms - reports  anxiety it's through the roof. Report some depression - feeling overwhelmed. Reports varying interest and motivation. Reports irritability and frustration. Reports panic attacks - twice a week. Reports some worry and over thinking. Denies rumination. Mood is lower. Stating I don't feel like I'm doing too good. Feels like medications are helpful, but is willing to consider other options. Taking medications as prescribed.  Energy levels lower. Active, does not have a regular exercise routine.  Enjoys some usual interests and activities. Married. Lives with husband and 2 dogs - parrots. Has 2 grown children. Spending time with family. Appetite adequate. Weight loss - 119 from 125 pounds. Sleeping better some nights than others. 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 Reports focus and concentration difficulties - every day stuff. Completing tasks. Managing aspects of household. Typically works full time - [employer]. Denies SI or HI.  Denies AH or VH.  Denies self harm. Denies substance use.  Past trials of medication: Sertraline, Paxil, Fluoxetine , Lexapro, Wellbutrin , Abilify , Buspar    GAD-7    Flowsheet Row Office Visit from 12/19/2023 in [facility] Office Visit from 06/17/2023 in [facility] Primary Care Office Visit from 05/10/2023 in [facility] Primary Care Office Visit from 11/15/2022 in [facility] Primary Care  Total GAD-7 Score 0 7 4 2    PHQ2-9    Flowsheet Row Office Visit from 12/19/2023 in [facility] Primary Care Office Visit from 06/17/2023 in [facility] Primary Care Office Visit from 05/10/2023 in [facility] Video Visit from 03/10/2023 in [facility] Primary [facility] Office Visit from 11/15/2022 in [facility]  PHQ-2 Total Score 0 2 2 0 1  PHQ-9 Total Score 0 5 4 -- 5   Flowsheet Row ED to Hosp-Admission (Discharged) from 09/30/2023 in [place] 2C CV PROGRESSIVE CARE ED to Hosp-Admission (Discharged) from 09/24/2023 in [facility] ED to Hosp-Admission (Discharged) from 04/23/2021 in [place] 6E Progressive Care  C-SSRS RISK CATEGORY No Risk No Risk No Risk     Review of Systems:  Review of Systems  Musculoskeletal:  Negative for gait problem.  Neurological:  Negative for tremors.  Psychiatric/Behavioral:         Please refer to HPI    Medications: I have reviewed the patient's current medications.  Current Outpatient Medications  Medication Sig Dispense Refill   albuterol  (VENTOLIN  HFA) 108 (90 Base) MCG/ACT inhaler Inhale 2 puffs into the lungs every 6 (six) hours as needed for wheezing or shortness of breath.     ALPRAZolam  (XANAX ) 0.5 MG tablet Take one tablet four times daily as needed for anxiety. 120 tablet 2   aspirin  EC 81 MG tablet Take 1 tablet (81 mg total) by mouth daily. 30 tablet 11   atorvastatin  (LIPITOR) 80 MG tablet Take 1 tablet (80 mg total) by mouth daily. 90 tablet 0   buPROPion  (WELLBUTRIN  XL) 150 MG 24 hr tablet Take 1 tablet (150 mg total) by mouth daily. 30 tablet 5   Cholecalciferol  (VITAMIN D3 PO) Take 1 tablet by  mouth daily.     esomeprazole  (NEXIUM ) 40 MG capsule Take 1 capsule (40 mg total) by mouth daily. 30 capsule 0   Evolocumab  (REPATHA  SURECLICK) 140 MG/ML SOAJ Inject 140 mg into the skin every 14 (fourteen) days. 6 mL 1   FLUoxetine  (PROZAC ) 40 MG capsule Take 1 capsule (40 mg total) by mouth 2 (two) times daily. 60 capsule 5   fluticasone  (FLONASE ) 50 MCG/ACT nasal spray Place 2 sprays into both nostrils  daily. 16 g 1   furosemide  (LASIX ) 40 MG tablet Take 1 tablet (40 mg total) by mouth daily. (Patient taking differently: Take 40 mg by mouth daily as needed for fluid.) 90 tablet 3   losartan  (COZAAR ) 50 MG tablet Take 1 tablet (50 mg total) by mouth daily. 90 tablet 0   meloxicam  (MOBIC ) 15 MG tablet One tab PO qAM with breakfast for 2 weeks, then daily prn pain. 30 tablet 3   Multiple Vitamins-Minerals (CENTRUM SILVER PO) Take 1 tablet by mouth daily.     ondansetron  (ZOFRAN ) 4 MG tablet Take 1 tablet (4 mg total) by mouth daily as needed for nausea or vomiting. 30 tablet 1   promethazine -dextromethorphan (PROMETHAZINE -DM) 6.25-15 MG/5ML syrup Take 5 mLs by mouth 4 (four) times daily as needed. 118 mL 0   vitamin B-12 (CYANOCOBALAMIN ) 100 MCG tablet Take 100 mcg by mouth daily.     zolpidem  (AMBIEN ) 10 MG tablet Take 1 tablet (10 mg total) by mouth at bedtime. 30 tablet 2   No current facility-administered medications for this visit.    Medication Side Effects: None  Allergies:  Allergies  Allergen Reactions   Isosorbide  Nitrate Other (See Comments)    Headaches; Worsening Chest Pain    Past Medical History:  Diagnosis Date   ADD (attention deficit disorder with hyperactivity)    Anxiety    Bipolar 1 disorder (HCC)    Carotid artery disease (HCC)    Right 50-69% stenosis by doppler 2010   Chest pain 04/23/2021   Chronic diastolic (congestive) heart failure (HCC)    COPD (chronic obstructive pulmonary disease) (HCC)    Phreesia 12/15/2020   Coronary artery disease    a. s/p CABG in 2009 with LIMA-LAD, SVG-RI, and SVG-RCA b. low-risk NST in 10/2015 c. low-risk NST in 12/2018.   CVA (cerebral infarction) 12/2008   Reports a neurologist told her she had a blood clot in her brain   Depression    Depression    Phreesia 12/15/2020   Emphysema of lung (HCC) 06/19/2018   Fibromyalgia    GERD (gastroesophageal reflux disease)    Hyperlipidemia    Hypertension    Myocardial  infarction (HCC) 09/18/2011   Phreesia 12/15/2020   OSA on CPAP 12/31/2018   Preventative health care 07/16/2022   Sleep apnea    Phreesia 12/15/2020   Stroke [facility])    Phreesia 12/15/2020   Tobacco abuse    Upper respiratory tract infection 11/09/2021    Past Medical History, Surgical history, Social history, and Family history were reviewed and updated as appropriate.   Please see review of systems for further details on the patient's review from today.   Objective:   Physical Exam:  LMP 10/22/2015   Physical Exam Constitutional:      General: She is not in acute distress.  Musculoskeletal:        General: No deformity.   Neurological:     Mental Status: She is alert and oriented to person, place, and time.  Coordination: Coordination normal.   Psychiatric:        Attention and Perception: Attention and perception normal. She does not perceive auditory or visual hallucinations.        Mood and Affect: Mood is anxious and depressed. Affect is not labile, blunt, angry or inappropriate.        Speech: Speech normal.        Behavior: Behavior normal.        Thought Content: Thought content normal. Thought content is not paranoid or delusional. Thought content does not include homicidal or suicidal ideation. Thought content does not include homicidal or suicidal plan.        Cognition and Memory: Cognition and memory normal.        Judgment: Judgment normal.     Comments: Insight intact     Lab Review:     Component Value Date/Time   NA 137 10/03/2023 0228   NA 137 07/16/2022 0953   K 4.6 10/03/2023 0228   CL 103 10/03/2023 0228   CO2 26 10/03/2023 0228   GLUCOSE 107 (H) 10/03/2023 0228   BUN 10 10/03/2023 0228   BUN 10 07/16/2022 0953   CREATININE 0.76 10/03/2023 0228   CALCIUM  9.0 10/03/2023 0228   PROT 6.9 09/30/2023 2006   PROT 8.1 07/16/2022 0953   ALBUMIN 3.6 09/30/2023 2006   ALBUMIN 4.9 07/16/2022 0953   AST 160 (H) 09/30/2023 2006   ALT 89 (H)  09/30/2023 2006   ALKPHOS 189 (H) 09/30/2023 2006   BILITOT 0.6 09/30/2023 2006   BILITOT 0.2 07/16/2022 0953   GFRNONAA >60 10/03/2023 0228   GFRAA >60 10/07/2019 1040       Component Value Date/Time   WBC 6.4 10/03/2023 0229   RBC 4.25 10/03/2023 0229   HGB 12.6 10/03/2023 0229   HGB 12.5 07/16/2022 0953   HCT 38.5 10/03/2023 0229   HCT 38.7 07/16/2022 0953   PLT 382 10/03/2023 0229   PLT 378 07/16/2022 0953   MCV 90.6 10/03/2023 0229   MCV 86 07/16/2022 0953   MCH 29.6 10/03/2023 0229   MCHC 32.7 10/03/2023 0229   RDW 13.5 10/03/2023 0229   RDW 12.2 07/16/2022 0953   LYMPHSABS 4.4 (H) 07/16/2022 0953   MONOABS 0.4 12/01/2020 1324   EOSABS 0.1 07/16/2022 0953   BASOSABS 0.1 07/16/2022 0953    No results found for: POCLITH, LITHIUM   No results found for: PHENYTOIN, PHENOBARB, VALPROATE, CBMZ   .res Assessment: Plan:    Plan:  PDMP reviewed  Wellbutrin  XL 150mg  - denies seizure history Fluoxetine  80 mg daily  Ambien  10mg  as needed Xanax  0.5mg  4 x daily prn for increased anxiety.   Has seen a therapist in the past  RTC 3 months  Patient advised to contact office with any questions, adverse effects, or acute worsening in signs and symptoms.  Discussed potential benefits, risk, and side effects of benzodiazepines to include potential risk of tolerance and dependence, as well as possible drowsiness.  Advised patient not to drive if experiencing drowsiness and to take lowest possible effective dose to minimize risk of dependence and tolerance.  Discussed potential metabolic side effects associated with atypical antipsychotics, as well as potential risk for movement side effects. Advised pt to contact office if movement side effects occur. ease see After Visit Summary for patient specific instructions.  There are no diagnoses linked to this encounter.   Please see After Visit Summary for patient specific instructions.  Future Appointments  Date Time  Provider Department Center  04/13/2024  9:00 AM Zonie Crutcher, Ursula Gardner, NP [facility] None  04/13/2024 11:15 AM Syliva Even, MD [facility] None  05/08/2024  9:00 AM Syliva Even, MD [facility] None    No orders of the defined types were placed in this encounter.   -------------------------------

## 2024-04-16 ENCOUNTER — Telehealth: Payer: Self-pay

## 2024-04-16 NOTE — Telephone Encounter (Signed)
FMLA forms received

## 2024-04-16 NOTE — Telephone Encounter (Signed)
 Continuous leave 04/12/24-05/25/24  Fax to [facility] in [place] (F) [phone]

## 2024-04-16 NOTE — Telephone Encounter (Signed)
Form completed and placed on Dr. Corey's desk to review and sign.  

## 2024-04-17 ENCOUNTER — Other Ambulatory Visit: Payer: Self-pay | Admitting: Internal Medicine

## 2024-04-17 DIAGNOSIS — J069 Acute upper respiratory infection, unspecified: Secondary | ICD-10-CM

## 2024-04-17 NOTE — Telephone Encounter (Signed)
 Release returned by pt, printed, and given to Julie.

## 2024-04-17 NOTE — Telephone Encounter (Signed)
 Form reviewed and signed by Dr. Denyse Amass, placed at the front desk for faxing/scanning.

## 2024-04-19 NOTE — Progress Notes (Signed)
   I, [name], CMA acting as a Neurosurgeon for Monica Lloyd, MD.  [name] is a 58 y.o. female who presents to [facility] at [facility] today for 1-wk f/u L arm pain and R forearm pain thought to be do to intersection syndrome. Pt was last seen by Dr. Lloyd on 04/13/24 and was prescribed prednisone , gabapentin , advised to cont wrist brace and remain out of work through 05/25/24. FMLA paperwork completed.  Today, pt reports some improvement of right shoulder, arm, and wrist pain with rest. Notes continued left shoulder pain. Pain radiating from the neck down the posterior aspect of the arm into the hand. Notes n/t in the 3rd and 4th digits. Minimal improvement while taking Prednisone . Some tightness with ROM.  She does note a little bit of weakness in that left arm.  Dx testing: 04/13/24 C-spine XR  Pertinent review of systems: No fevers or chills  Relevant historical information: Right arm intersection syndrome.   Exam:  BP (!) 150/72   Pulse (!) 49   Ht 5' 2 (1.575 m)   Wt 119 lb (54 kg)   LMP 10/22/2015   SpO2 98%   BMI 21.77 kg/m  General: Well Developed, well nourished, and in no acute distress.   MSK: Right wrist normal-appearing not particular tender to palpation normal wrist motion.  C-spine: Normal appearing normal cervical motion.  Upper extremity strength mildly reduced left triceps extension 4/5.  Reflexes are intact.  Sensation is intact.  Strength is intact otherwise.       Assessment and Plan: 58 y.o. female with right intersection syndrome improving with course of oral prednisone  and bracing and rest.  Anticipate return to work on August 1.  Left arm cervical radiculopathy at C7 distribution.  This is associated with some weakness at triceps extension which does fit with C7.  She did have an x-ray of her cervical spine last week that did not show severe changes.  Plan for MRI C-spine to further evaluate source of weakness and pain.  She  lives in [place]  so we will do an MRI there.  Anticipate epidural steroid injection once we get the results back.  Recheck towards the end of July.   PDMP not reviewed this encounter. Orders Placed This Encounter  Procedures   MR CERVICAL SPINE WO CONTRAST    [place], [place] [facility]    Standing Status:   Future    Expiration Date:   05/20/2024    What is the patient's sedation requirement?:   No Sedation    Does the patient have a pacemaker or implanted devices?:   No    Preferred imaging location?:   External   No orders of the defined types were placed in this encounter.    Discussed warning signs or symptoms. Please see discharge instructions. Patient expresses understanding.   The above documentation has been reviewed and is accurate and complete [name], M.D.

## 2024-04-20 ENCOUNTER — Ambulatory Visit: Admitting: Family Medicine

## 2024-04-20 ENCOUNTER — Encounter: Payer: Self-pay | Admitting: Family Medicine

## 2024-04-20 VITALS — BP 150/72 | HR 49 | Ht 62.0 in | Wt 119.0 lb

## 2024-04-20 DIAGNOSIS — R29898 Other symptoms and signs involving the musculoskeletal system: Secondary | ICD-10-CM | POA: Diagnosis not present

## 2024-04-20 DIAGNOSIS — M65831 Other synovitis and tenosynovitis, right forearm: Secondary | ICD-10-CM | POA: Diagnosis not present

## 2024-04-20 DIAGNOSIS — M5412 Radiculopathy, cervical region: Secondary | ICD-10-CM

## 2024-04-20 NOTE — Patient Instructions (Addendum)
 Thank you for coming in today.   You should hear from MRI scheduling within 1 week. If you do not hear please let me know.    Check back in 1 month

## 2024-04-23 ENCOUNTER — Ambulatory Visit: Payer: Self-pay | Admitting: Family Medicine

## 2024-04-23 NOTE — Progress Notes (Signed)
 Cervical spine x-ray shows some arthritis at the base of the neck.

## 2024-04-26 ENCOUNTER — Encounter: Payer: Self-pay | Admitting: Acute Care

## 2024-05-07 NOTE — Progress Notes (Unsigned)
 I, [name] am a scribe for Dr. [name], MD.  [name] is a 58 y.o. female who presents to [facility] today for f/u L arm pain/weakness and R wrist pain w/ MRI review. Pt was last seen by Dr. Lloyd on 04/20/24 and a cervical MRI was ordered.   Today, pt reports right wrist is ok. Right shoulder doing alright after the shot. Left shoulder on and off.  Dx testing: 05/02/24 C-spine MRI 04/13/24 C-spine XR   Pertinent review of systems: No fevers or chills  Relevant historical information: History of a stroke coronary artery disease COPD.   Exam:  BP 120/70   Pulse 67   Ht 5' 2 (1.575 m)   Wt 120 lb 6.4 oz (54.6 kg)   LMP 10/22/2015   SpO2 98%   BMI 22.02 kg/m  General: Well Developed, well nourished, and in no acute distress.   MSK: C-spine normal-appearing decreased cervical motion upper extremity strength is intact.    Lab and Radiology Results  Monica Aleene Charleston, MD - 05/02/2024 Formatting of this note might be different from the original. Exam:  Cervical spine MRI without IV contrast  History: Cervical radiculopathy, arm weakness  Technique: Complete MRI of the cervical spine without IV contrast.  Comparison: None.  Findings: There is no cervical scoliosis. There is no focal malalignment. There is no compression fracture. Marrow signal intensity is normal throughout the cervical spine and visible portion of the upper thoracic spine. There is no disc space narrowing. There is an 8 mm cystic structure right anterior to the C6 vertebral body. 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 CT of the neck soft tissues with IV contrast could be used for further evaluation if desired.  The visible portion of the spinal cord (craniocervical junction to T3) is normal in signal intensity. The medulla and cerebellar tonsils are normal in appearance. There is no Chiari malformation or hindbrain herniation.  C1-2:  Normal.  C2-3: There is mild right and moderate left foraminal narrowing without cord contour deformity or canal stenosis.  C3-4 and C4-5: Normal.  C5-6: There is moderate bilateral foraminal narrowing. There is mild flattening of the ventral surface of the spinal cord and mild canal stenosis due to disc bulge and ligamenta flava hypertrophy.  C6-7: There is mild right and moderate left foraminal narrowing without cord contour deformity or canal stenosis.  C7-T1 through T2-3: Normal.  IMPRESSION: 1.    Cervical spondylosis (uncovertebral osteophytes and/or facet hypertrophy) results in a 3, C6, and C7 foraminal narrowing as described above. 2.    Disc bulge and ligamenta flava hypertrophy at C5-6 causes mild cord contour deformity and mild canal stenosis. 3.    No cord signal abnormality. 4.    8 mm cystic structure right anterior to the C6 vertebral body. 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 CT of the neck soft tissues with IV contrast could be used for further evaluation if desired.  Signed (Electronic Signature): 05/02/2024 3:47 PM Signed By: Aleene JONELLE Suzen, MD Exam End: 05/02/24 08:36   Specimen Collected: 05/02/24 15:36       Assessment and Plan: 58 y.o. female with left cervical radiculopathy.  C7 dermatomal pattern is her best clinical and radiographic fit.  Plan for epidural steroid injection.  This should help quite a lot to improve her symptoms.  Can repeat the injection with a phone call or MyChart message if  needed.  Ultimately if needed would recommend Dr. Katrina is a surgery consultation if needed.  Additionally she had an incidentally found cystic structure thought to be associated with the right thyroid  lobe on her cervical spine MRI.  Radiology recommended a CT scan soft tissue neck with IV contrast to evaluate it further which I have ordered.  We discussed all the results with the patient and discussed treatment plan and options.   PDMP not  reviewed this encounter. Orders Placed This Encounter  Procedures   CT SOFT TISSUE NECK W CONTRAST    [place] [facility] location    Standing Status:   Future    Expiration Date:   05/08/2025    If indicated for the ordered procedure, I authorize the administration of contrast media per Radiology protocol:   Yes    Does the patient have a contrast media/X-ray dye allergy?:   No    Is patient pregnant?:   No    Preferred imaging location?:   External   DG FLUORO GUIDED NEEDLE PLC ASPIRATION/INJECTION LOC    [place] location    Standing Status:   Future    Expiration Date:   05/08/2025    Lab orders requested (DO NOT place separate lab orders, these will be automatically ordered during procedure specimen collection)::   None    Reason for Exam (SYMPTOM  OR DIAGNOSIS REQUIRED):   cystic structure    Is the patient pregnant?:   No    Preferred Imaging Location?:   External   DG INJECT DIAG/THERA/INC NEEDLE/CATH/PLC EPI/CERV/THOR W/IMG    Level and technique per radiolog// CERV EPI#1// [employer]BS AL()// WT:120  PACS(PT TO BRING CD)NOTES(05/08/24) / ASP- PT AWARE TO HOLD / NO NEEDS/ NO  ALLERGIES//pt aware to have a driver/ No solid Food 4 hours prior [employer]    Standing Status:   Future    Expiration Date:   06/08/2024    Reason for Exam (SYMPTOM  OR DIAGNOSIS REQUIRED):   cervical radiculopathy    Preferred Imaging Location?:   [facility]    Is the patient pregnant?:   No   No orders of the defined types were placed in this encounter.    Discussed warning signs or symptoms. Please see discharge instructions. Patient expresses understanding.   The above documentation has been reviewed and is accurate and complete [name], M.D.

## 2024-05-08 ENCOUNTER — Ambulatory Visit: Admitting: Family Medicine

## 2024-05-08 ENCOUNTER — Ambulatory Visit: Payer: Self-pay | Admitting: Adult Health

## 2024-05-08 VITALS — BP 120/70 | HR 67 | Ht 62.0 in | Wt 120.4 lb

## 2024-05-08 DIAGNOSIS — M5412 Radiculopathy, cervical region: Secondary | ICD-10-CM

## 2024-05-08 DIAGNOSIS — E041 Nontoxic single thyroid nodule: Secondary | ICD-10-CM | POA: Diagnosis not present

## 2024-05-08 NOTE — Telephone Encounter (Signed)
 Paperwork may need updated. Extended leave through Sept 1st.

## 2024-05-08 NOTE — Patient Instructions (Addendum)
 Thank you for coming in today.   Please call [facility] ([facility]) at [phone] to schedule your spine injection.    Work note provided  You should hear about scheduling the CT within 1 week. If you do not hear please let me know.    Reminder: Dr. Joane will be out of the office starting August 1st, for about 6 weeks

## 2024-05-09 NOTE — Telephone Encounter (Signed)
 Form completed, placed on Dr. Zollie Pee desk to review and sign.

## 2024-05-14 NOTE — Telephone Encounter (Signed)
 Forms faxed successfully to [phone], sent to scan.

## 2024-05-16 NOTE — Discharge Instructions (Signed)

## 2024-05-17 ENCOUNTER — Inpatient Hospital Stay
Admission: RE | Admit: 2024-05-17 | Discharge: 2024-05-17 | Disposition: A | Discharge status: Home / Self Care | Source: Ambulatory Visit | Attending: Family Medicine | Admitting: Family Medicine

## 2024-05-17 DIAGNOSIS — M5412 Radiculopathy, cervical region: Secondary | ICD-10-CM

## 2024-05-17 MED ORDER — TRIAMCINOLONE ACETONIDE 40 MG/ML IJ SUSP (RADIOLOGY)
60.0000 mg | Freq: Once | INTRAMUSCULAR | Status: AC
  Administered 2024-05-17: 60 mg via EPIDURAL

## 2024-05-17 MED ORDER — IOPAMIDOL (ISOVUE-M 300) INJECTION 61%
1.0000 mL | Freq: Once | INTRAMUSCULAR | Status: AC | PRN
  Administered 2024-05-17: 1 mL via EPIDURAL

## 2024-05-17 NOTE — Telephone Encounter (Addendum)
 Request received for treatment and return to work plan.   Letter drafted, placed on Dr. Virgilio desk to review and sign.

## 2024-05-18 NOTE — Telephone Encounter (Signed)
 Letter reviewed and signed by Dr. Joane, placed at the front desk for faxing/scanning.

## 2024-05-22 ENCOUNTER — Telehealth: Payer: Self-pay | Admitting: Family Medicine

## 2024-05-22 DIAGNOSIS — I10 Essential (primary) hypertension: Secondary | ICD-10-CM

## 2024-05-22 NOTE — Telephone Encounter (Signed)
 Labs ordered. Printed requisitions and given to [employer] to fax to external location, [facility] in [place] .

## 2024-05-22 NOTE — Telephone Encounter (Signed)
 I received a call from [place] at [facility] in [place].  They are needing a BUN and Creatine level on the patient due to her high blood pressure.  They asked if these could be ordered and sent to them?  Phone: [phone] Fax: [phone]

## 2024-05-22 NOTE — Addendum Note (Signed)
 Addended by: GEROME ILEANA RAMAN on: 05/22/2024 02:51 PM   Modules accepted: Orders

## 2024-05-22 NOTE — Telephone Encounter (Signed)
 Received a call back. The patient is scheduled for today at 330pm. They are needing the lab orders faxed over to them so that she can have them done there before the CT.

## 2024-06-08 ENCOUNTER — Telehealth: Payer: Self-pay

## 2024-06-08 ENCOUNTER — Encounter: Payer: Self-pay | Admitting: Family Medicine

## 2024-06-08 NOTE — Telephone Encounter (Signed)
 [name] Pam  P [facility] (supporting Artist GORMAN Lloyd, MD) 5 hours ago (9:38 AM)    Good morning, I hope all is well with you all. I understand that Dr Lloyd is out of the office until September 14th. My work notes previously from your office has me going back September 1st.  I am feeling great at this point, and the shot in my back/ neck has done wonderful. I'm still wanting to return to work September 1, but to tell you the truth, I don't really have the money to come for another follow up visit for returning to work. Is there any way that you can fax a return to work note to my place of employment? [employer] in [place]. You should have the fax number on file. Please let me know if this reaches you, and if this is possible. I appreciate your teams hard work that has been done for me.  [name]

## 2024-06-08 NOTE — Telephone Encounter (Signed)
 MyChart message sent to pt:  Hi [name],  I have your Return to Work Authorization form from [employer]. I'll give you a call one day next week to go over the specifics of the request. Given that we just saw you July 2025 and given your significant improvement, I do not think that it is necessary for you to return for a follow-up visit for form completion; I will document our conversation in a telephone encounter, that should be sufficient.   Write down any specific questions or concerns that you may have and we will go over it next week when I call to review the Return to Work form with you.   Have a great weekend,   [place], CMA

## 2024-06-12 NOTE — Telephone Encounter (Signed)
 Form completed, placed on Dr. Virgilio desk to review and sign upon his return to the office on 06/14/24.

## 2024-06-14 NOTE — Telephone Encounter (Signed)
 Form reviewed and signed by Dr. Denyse Amass, placed at the front desk for faxing/scanning.

## 2024-07-13 ENCOUNTER — Ambulatory Visit (INDEPENDENT_AMBULATORY_CARE_PROVIDER_SITE_OTHER): Payer: Self-pay | Admitting: Adult Health

## 2024-07-13 DIAGNOSIS — Z0389 Encounter for observation for other suspected diseases and conditions ruled out: Secondary | ICD-10-CM

## 2024-07-13 NOTE — Progress Notes (Signed)
 Patient no show appointment. ? ?

## 2024-07-22 ENCOUNTER — Other Ambulatory Visit: Payer: Self-pay | Admitting: Adult Health

## 2024-07-22 DIAGNOSIS — G4700 Insomnia, unspecified: Secondary | ICD-10-CM

## 2024-07-30 ENCOUNTER — Other Ambulatory Visit: Payer: Self-pay | Admitting: Adult Health

## 2024-07-30 DIAGNOSIS — G4700 Insomnia, unspecified: Secondary | ICD-10-CM

## 2024-08-01 ENCOUNTER — Telehealth: Payer: Self-pay | Admitting: Adult Health

## 2024-08-01 ENCOUNTER — Other Ambulatory Visit: Payer: Self-pay | Admitting: Adult Health

## 2024-08-01 DIAGNOSIS — G4700 Insomnia, unspecified: Secondary | ICD-10-CM

## 2024-08-01 NOTE — Telephone Encounter (Signed)
 Pt called at 4:20p requesting refill of Wellbutrin  and Zolpidem  to   [facility] - [place], [place] - [address], [place] [address] Phone: [phone]  Fax: [phone]   Next appt 10/15

## 2024-08-02 ENCOUNTER — Other Ambulatory Visit: Payer: Self-pay

## 2024-08-02 NOTE — Telephone Encounter (Signed)
 Wellbutrin  sent, Zolpidem  addressed thru pharmacy interface.

## 2024-08-08 ENCOUNTER — Ambulatory Visit: Admitting: Adult Health

## 2024-08-08 ENCOUNTER — Encounter: Payer: Self-pay | Admitting: Adult Health

## 2024-08-08 DIAGNOSIS — G4700 Insomnia, unspecified: Secondary | ICD-10-CM | POA: Diagnosis not present

## 2024-08-08 DIAGNOSIS — F331 Major depressive disorder, recurrent, moderate: Secondary | ICD-10-CM | POA: Diagnosis not present

## 2024-08-08 DIAGNOSIS — F4310 Post-traumatic stress disorder, unspecified: Secondary | ICD-10-CM | POA: Diagnosis not present

## 2024-08-08 DIAGNOSIS — F411 Generalized anxiety disorder: Secondary | ICD-10-CM | POA: Diagnosis not present

## 2024-08-08 MED ORDER — BUPROPION HCL ER (XL) 300 MG PO TB24: 300.0000 mg | ORAL_TABLET | Freq: Every day | ORAL | 2 refills | Status: AC

## 2024-08-08 MED ORDER — BUPROPION HCL ER (XL) 150 MG PO TB24: 150.0000 mg | ORAL_TABLET | Freq: Every day | ORAL | 5 refills | Status: DC

## 2024-08-08 MED ORDER — ALPRAZOLAM 0.5 MG PO TABS: ORAL_TABLET | ORAL | 2 refills | Status: AC

## 2024-08-08 MED ORDER — ZOLPIDEM TARTRATE 10 MG PO TABS: 10.0000 mg | ORAL_TABLET | Freq: Every day | ORAL | 2 refills | Status: AC

## 2024-08-08 MED ORDER — FLUOXETINE HCL 40 MG PO CAPS: 40.0000 mg | ORAL_CAPSULE | Freq: Two times a day (BID) | ORAL | 5 refills | Status: AC

## 2024-08-08 NOTE — Progress Notes (Signed)
 [name] [id number] August 20, 1966 58 y.o.  Subjective:   Patient ID:  [name] is a 58 y.o. (DOB [birth year]) female.  Chief Complaint: No chief complaint on file.   HPI [name] presents to the office today for follow-up of Bipolar 1 disorder, ADHD, GAD, PTSD, and insomnia.  Describes mood today as not the best. Pleasant. Mood symptoms - reports increased depression with recent loss of father. Report some anxiety and irritability. Reports lower interest and motivation. Reports recent panic attacks. Reports some worry and over thinking. Reports rumination. Reports mood is lower. Stating I haven't been doing too good. Feels like medications are helpful - I feel like I'm scraping by.  Taking medications as prescribed.  Energy levels lower. Active, does not have a regular exercise routine.  Enjoys some usual interests and activities. Married. Lives with husband and 2 dogs - parrots. Has 2 grown children. Spending time with family. Appetite adequate. Weight stable - 119 pounds. Sleeping better some nights than others - less with recent loss of father. Averages 4 to 5 hours. Reports focus and concentration difficulties. Completing tasks. Managing aspects of household. Typically works full time - 2,23 schedule 12 hour shifts.. Denies SI or HI.  Denies AH or VH.  Denies self harm. Denies substance use.  Past trials of medication: Sertraline, Paxil, Fluoxetine , Lexapro, Wellbutrin , Abilify , Buspar    GAD-7    Flowsheet Row Office Visit from 12/19/2023 in [facility] Office Visit from 06/17/2023 in [facility] Primary [facility] Office Visit from 05/10/2023 in [facility] Office Visit from 11/15/2022 in [facility]  Total GAD-7 Score 0 7 4 2    PHQ2-9    Flowsheet Row Office Visit from 12/19/2023 in [facility] Office Visit from 06/17/2023 in [facility] Primary  Care Office Visit from 05/10/2023 in [facility] Primary [facility] Video Visit from 03/10/2023 in [facility] Office Visit from 11/15/2022 in [facility]  PHQ-2 Total Score 0 2 2 0 1  PHQ-9 Total Score 0 5 4 -- 5   Flowsheet Row ED to Hosp-Admission (Discharged) from 09/30/2023 in [facility] PROGRESSIVE [facility] ED to Hosp-Admission (Discharged) from 09/24/2023 in [facility] ED to Hosp-Admission (Discharged) from 04/23/2021 in [facility] Progressive [facility]  C-SSRS RISK CATEGORY No Risk No Risk No Risk     Review of Systems:  Review of Systems  Musculoskeletal:  Negative for gait problem.  Neurological:  Negative for tremors.  Psychiatric/Behavioral:         Please refer to HPI    Medications: I have reviewed the patient's current medications.  Current Outpatient Medications  Medication Sig Dispense Refill   albuterol  (VENTOLIN  HFA) 108 (90 Base) MCG/ACT inhaler Inhale 2 puffs into the lungs every 6 (six) hours as needed for wheezing or shortness of breath.     ALPRAZolam  (XANAX ) 0.5 MG tablet Take one tablet four times daily as needed for anxiety. 120 tablet 2   aspirin  EC 81 MG tablet Take 1 tablet (81 mg total) by mouth daily. 30 tablet 11   atorvastatin  (LIPITOR) 80 MG tablet Take 1 tablet (80 mg total) by mouth daily. 90 tablet 0   buPROPion  (WELLBUTRIN  XL) 150 MG 24 hr tablet Take 1 tablet (150 mg total) by mouth daily. 30 tablet 5   Cholecalciferol  (VITAMIN D3 PO) Take 1 tablet by mouth daily.  esomeprazole  (NEXIUM ) 40 MG capsule Take 1 capsule (40 mg total) by mouth daily. 30 capsule 0   Evolocumab  (REPATHA  SURECLICK) 140 MG/ML SOAJ Inject 140 mg into the skin every 14 (fourteen) days. 6 mL 1   FLUoxetine  (PROZAC ) 40 MG capsule Take 1 capsule (40 mg total) by mouth 2 (two) times daily. 60 capsule 5   fluticasone  (FLONASE ) 50 MCG/ACT nasal spray INSTILL 2 SPRAYS IN EACH NOSTRIL DAILY 16 g 1   furosemide   (LASIX ) 40 MG tablet Take 1 tablet (40 mg total) by mouth daily. (Patient taking differently: Take 40 mg by mouth daily as needed for fluid.) 90 tablet 3   gabapentin  (NEURONTIN ) 300 MG capsule Take 1 capsule (300 mg total) by mouth 3 (three) times daily as needed. 90 capsule 2   losartan  (COZAAR ) 50 MG tablet Take 1 tablet (50 mg total) by mouth daily. 90 tablet 0   meloxicam  (MOBIC ) 15 MG tablet One tab PO qAM with breakfast for 2 weeks, then daily prn pain. 30 tablet 3   Multiple Vitamins-Minerals (CENTRUM SILVER PO) Take 1 tablet by mouth daily.     ondansetron  (ZOFRAN ) 4 MG tablet Take 1 tablet (4 mg total) by mouth daily as needed for nausea or vomiting. 30 tablet 1   promethazine -dextromethorphan (PROMETHAZINE -DM) 6.25-15 MG/5ML syrup Take 5 mLs by mouth 4 (four) times daily as needed. 118 mL 0   vitamin B-12 (CYANOCOBALAMIN ) 100 MCG tablet Take 100 mcg by mouth daily.     zolpidem  (AMBIEN ) 10 MG tablet TAKE 1 TABLET BY MOUTH AT BEDTIME 6 tablet 0   No current facility-administered medications for this visit.    Medication Side Effects: None  Allergies:  Allergies  Allergen Reactions   Isosorbide  Nitrate Other (See Comments)    Headaches; Worsening Chest Pain    Past Medical History:  Diagnosis Date   ADD (attention deficit disorder with hyperactivity)    Anxiety    Bipolar 1 disorder (HCC)    Carotid artery disease    Right 50-69% stenosis by doppler 2010   Chest pain 04/23/2021   Chronic diastolic (congestive) heart failure (HCC)    COPD (chronic obstructive pulmonary disease) (HCC)    Phreesia 12/15/2020   Coronary artery disease    a. s/p CABG in 2009 with LIMA-LAD, SVG-RI, and SVG-RCA b. low-risk NST in 10/2015 c. low-risk NST in 12/2018.   CVA (cerebral infarction) 12/2008   Reports a neurologist told her she had a blood clot in her brain   Depression    Depression    Phreesia 12/15/2020   Emphysema of lung (HCC) 06/19/2018   Fibromyalgia    GERD  (gastroesophageal reflux disease)    Hyperlipidemia    Hypertension    Myocardial infarction (HCC) 09/18/2011   Phreesia 12/15/2020   OSA on CPAP 12/31/2018   Preventative health care 07/16/2022   Sleep apnea    Phreesia 12/15/2020   Stroke [facility])    Phreesia 12/15/2020   Tobacco abuse    Upper respiratory tract infection 11/09/2021    Past Medical History, Surgical history, Social history, and Family history were reviewed and updated as appropriate.   Please see review of systems for further details on the patient's review from today.   Objective:   Physical Exam:  LMP 10/22/2015   Physical Exam Constitutional:      General: She is not in acute distress. Musculoskeletal:        General: No deformity.  Neurological:     Mental Status: She  is alert and oriented to person, place, and time.     Coordination: Coordination normal.  Psychiatric:        Attention and Perception: Attention and perception normal. She does not perceive auditory or visual hallucinations.        Mood and Affect: Mood normal. Mood is not anxious or depressed. Affect is not labile, blunt, angry or inappropriate.        Speech: Speech normal.        Behavior: Behavior normal.        Thought Content: Thought content normal. Thought content is not paranoid or delusional. Thought content does not include homicidal or suicidal ideation. Thought content does not include homicidal or suicidal plan.        Cognition and Memory: Cognition and memory normal.        Judgment: Judgment normal.     Comments: Insight intact     Lab Review:     Component Value Date/Time   NA 137 10/03/2023 0228   NA 137 07/16/2022 0953   K 4.6 10/03/2023 0228   CL 103 10/03/2023 0228   CO2 26 10/03/2023 0228   GLUCOSE 107 (H) 10/03/2023 0228   BUN 10 10/03/2023 0228   BUN 10 07/16/2022 0953   CREATININE 0.76 10/03/2023 0228   CALCIUM  9.0 10/03/2023 0228   PROT 6.9 09/30/2023 2006   PROT 8.1 07/16/2022 0953   ALBUMIN 3.6  09/30/2023 2006   ALBUMIN 4.9 07/16/2022 0953   AST 160 (H) 09/30/2023 2006   ALT 89 (H) 09/30/2023 2006   ALKPHOS 189 (H) 09/30/2023 2006   BILITOT 0.6 09/30/2023 2006   BILITOT 0.2 07/16/2022 0953   GFRNONAA >60 10/03/2023 0228   GFRAA >60 10/07/2019 1040       Component Value Date/Time   WBC 6.4 10/03/2023 0229   RBC 4.25 10/03/2023 0229   HGB 12.6 10/03/2023 0229   HGB 12.5 07/16/2022 0953   HCT 38.5 10/03/2023 0229   HCT 38.7 07/16/2022 0953   PLT 382 10/03/2023 0229   PLT 378 07/16/2022 0953   MCV 90.6 10/03/2023 0229   MCV 86 07/16/2022 0953   MCH 29.6 10/03/2023 0229   MCHC 32.7 10/03/2023 0229   RDW 13.5 10/03/2023 0229   RDW 12.2 07/16/2022 0953   LYMPHSABS 4.4 (H) 07/16/2022 0953   MONOABS 0.4 12/01/2020 1324   EOSABS 0.1 07/16/2022 0953   BASOSABS 0.1 07/16/2022 0953    No results found for: POCLITH, LITHIUM   No results found for: PHENYTOIN, PHENOBARB, VALPROATE, CBMZ   .res Assessment: Plan:    Plan:  PDMP reviewed  Increase Wellbutrin  XL 150mg  to 300mg  every morning - denies seizure history Fluoxetine  80 mg daily  Ambien  10mg  as needed Xanax  0.5mg  4 x daily prn for increased anxiety.   Has seen a therapist in the past  RTC 3 months  Patient advised to contact office with any questions, adverse effects, or acute worsening in signs and symptoms.  Discussed potential benefits, risk, and side effects of benzodiazepines to include potential risk of tolerance and dependence, as well as possible drowsiness.  Advised patient not to drive if experiencing drowsiness and to take lowest possible effective dose to minimize risk of dependence and tolerance.  Discussed potential metabolic side effects associated with atypical antipsychotics, as well as potential risk for movement side effects. Advised pt to contact office if movement side effects occur. ease see After Visit Summary for patient specific instructions.  There are no diagnoses linked  to this encounter.   Please see After Visit Summary for patient specific instructions.  Future Appointments  Date Time Provider Department Center  08/08/2024  4:30 PM Cleave Ternes Nattalie, NP [facility] None    No orders of the defined types were placed in this encounter.   -------------------------------

## 2024-09-12 ENCOUNTER — Telehealth: Payer: Self-pay

## 2024-09-12 NOTE — Telephone Encounter (Signed)
 PA for Zolpidem  10 mg #30/30 day submitted through [employer] ZNR#[id number]  PI#[id number]

## 2024-09-13 ENCOUNTER — Other Ambulatory Visit: Payer: Self-pay

## 2024-09-13 ENCOUNTER — Ambulatory Visit: Admitting: Family Medicine

## 2024-09-13 VITALS — BP 170/88 | HR 48 | Ht 62.0 in | Wt 119.0 lb

## 2024-09-13 DIAGNOSIS — M25511 Pain in right shoulder: Secondary | ICD-10-CM

## 2024-09-13 DIAGNOSIS — M5412 Radiculopathy, cervical region: Secondary | ICD-10-CM

## 2024-09-13 DIAGNOSIS — G8929 Other chronic pain: Secondary | ICD-10-CM

## 2024-09-13 MED ORDER — GABAPENTIN 300 MG PO CAPS: 300.0000 mg | ORAL_CAPSULE | Freq: Three times a day (TID) | ORAL | 2 refills | Status: AC | PRN

## 2024-09-13 NOTE — Progress Notes (Signed)
 LILLETTE Ileana Collet, PhD, LAT, ATC acting as a scribe for [name], MD.  [name] is a 58 y.o. female who presents to [facility] at [facility] today for R shoulder pain. Pt was last seen for her shoulder on 03/27/24 and was given a R subacromial steroid injection.  Today, pt reports R shoulder pain return in Aug. She returned to work and then after 2-wks R shoulder pain returned. Pain is locates to the lateral aspect of her R shoulder, into periscapular region, and R-side of her neck. Radiating pain into R hand, w/ numbness and finger swelling.  Dx imaging: 03/27/24 R shoulder XR  Pertinent review of systems: No fevers or chills  Relevant historical information: Coronary artery disease   Exam:  BP (!) 170/88   Pulse (!) 48   Ht 5' 2 (1.575 m)   Wt 128 lb (58.1 kg)   LMP 10/22/2015   SpO2 98%   BMI 23.41 kg/m  General: Well Developed, well nourished, and in no acute distress.   MSK: C-spine: Normal-appearing Decreased cervical motion. Strength reduced abduction and triceps extension. Reflexes are intact.  Right shoulder normal-appearing decreased range of motion pain with abduction.  Positive Hawkins and Neer's test.    Lab and Radiology Results  Procedure: Real-time Ultrasound Guided Injection of right shoulder subacromial bursa Device: Philips Affiniti 50G/GE Logiq Images permanently stored and available for review in PACS Verbal informed consent obtained.  Discussed risks and benefits of procedure. Warned about infection, bleeding, hyperglycemia damage to structures among others. Patient expresses understanding and agreement Time-out conducted.   Noted no overlying erythema, induration, or other signs of local infection.   Skin prepped in a sterile fashion.   Local anesthesia: Topical Ethyl chloride.   With sterile technique and under real time ultrasound guidance: 40 mg of Kenalog  and 2 mL eaters of Marcaine  injected into subacromial bursa.  Fluid seen entering the bursa.   Completed without difficulty   Pain immediately resolved suggesting accurate placement of the medication.   Advised to call if fevers/chills, erythema, induration, drainage, or persistent bleeding.   Images permanently stored and available for review in the ultrasound unit.  Impression: Technically successful ultrasound guided injection.        Assessment and Plan: 58 y.o. female with right shoulder and arm pain concerning for cervical radiculopathy and subacromial impingement.  In June of this year she had a subacromial injection which helped some.  In July of this year she had a epidural steroid injection which also helped.  Both treatments have helped but not lasted.  Plan for repeat subacromial bursa injection and obtain cervical spine MRI to further characterize source of pain.  Consider possible surgical referral once we get results back.  Gabapentin  refilled.   PDMP not reviewed this encounter. Orders Placed This Encounter  Procedures   US  LIMITED JOINT SPACE STRUCTURES UP RIGHT(NO LINKED CHARGES)    Reason for Exam (SYMPTOM  OR DIAGNOSIS REQUIRED):   right shoulder pain    Preferred imaging location?:   [facility]   MR CERVICAL SPINE WO CONTRAST    Standing Status:   Future    Expiration Date:   09/13/2025    What is the patient's sedation requirement?:   No Sedation    Does the patient have a pacemaker or implanted devices?:   No    Preferred imaging location?:   [facility] (table limit-550lbs)   Meds ordered this encounter  Medications  gabapentin  (NEURONTIN ) 300 MG capsule    Sig: Take 1 capsule (300 mg total) by mouth 3 (three) times daily as needed.    Dispense:  90 capsule    Refill:  2     Discussed warning signs or symptoms. Please see discharge instructions. Patient expresses understanding.   The above documentation has been reviewed and is accurate and complete [name], M.D.

## 2024-09-13 NOTE — Patient Instructions (Signed)
 Thank you for coming in today.   Refilled your gabapentin   You should hear from MRI scheduling within 1 week. If you do not hear please let me know.    Work note provided.  Let's see what the MRI shows and plan from there

## 2024-09-17 NOTE — Telephone Encounter (Signed)
 Zolpidem  10mg  tablets. Approved from 09/14/2024-09/13/2025.

## 2024-09-18 ENCOUNTER — Telehealth: Payer: Self-pay

## 2024-09-18 NOTE — Telephone Encounter (Signed)
 FMLA renewal  Due 10/03/2024

## 2024-09-23 ENCOUNTER — Encounter: Payer: Self-pay | Admitting: Family Medicine

## 2024-10-01 NOTE — Telephone Encounter (Signed)
 Remain out of work x 2 months, 09/13/24-11/13/24  Form completed, placed on Dr. Virgilio desk to review and sign.

## 2024-10-01 NOTE — Telephone Encounter (Signed)
 Form reviewed and signed by Dr. Denyse Amass, placed at the front desk for faxing/scanning.

## 2024-10-02 ENCOUNTER — Inpatient Hospital Stay: Admission: RE | Admit: 2024-10-02 | Discharge: 2024-10-02 | Attending: Family Medicine | Admitting: Family Medicine

## 2024-10-02 ENCOUNTER — Other Ambulatory Visit

## 2024-10-02 DIAGNOSIS — M5412 Radiculopathy, cervical region: Secondary | ICD-10-CM

## 2024-10-03 ENCOUNTER — Telehealth: Payer: Self-pay

## 2024-10-03 NOTE — Telephone Encounter (Signed)
 [employer] form received.

## 2024-10-08 ENCOUNTER — Ambulatory Visit: Payer: Self-pay | Admitting: Family Medicine

## 2024-10-08 NOTE — Telephone Encounter (Signed)
 Remain out of work 09/13/24-11/13/24.  MRI C-spine 10/02/24   Monica GORMAN Lloyd, MD 10/08/2024  6:39 AM EST     Cervical spine MRI shows severe spinal stenosis and pinched nerves at C5-C6 level.  We can try another injection but I am not particularly optimistic about it.  I would recommend surgical consultation with a spine surgeon.  What are your thoughts?

## 2024-10-08 NOTE — Telephone Encounter (Signed)
 Form completed, placed on Dr. Zollie Pee desk to review and sign.

## 2024-10-08 NOTE — Progress Notes (Signed)
 Cervical spine MRI shows severe spinal stenosis and pinched nerves at C5-C6 level.  We can try another injection but I am not particularly optimistic about it.  I would recommend surgical consultation with a spine surgeon.  What are your thoughts?

## 2024-10-09 NOTE — Telephone Encounter (Signed)
 Forms reviewed and signed by Dr. Joane, placed at the front desk for faxing/scanning.

## 2024-10-15 ENCOUNTER — Telehealth: Payer: Self-pay

## 2024-10-15 NOTE — Telephone Encounter (Signed)
 [employer] form received.

## 2024-10-23 NOTE — Telephone Encounter (Signed)
 Form completed, remain out of work 09/13/24-11/13/24.

## 2024-10-24 NOTE — Telephone Encounter (Signed)
 Form faxed to [employer] at [phone].

## 2024-11-08 ENCOUNTER — Ambulatory Visit: Admitting: Adult Health

## 2024-12-13 ENCOUNTER — Ambulatory Visit: Admitting: Adult Health

## 2025-01-23 ENCOUNTER — Encounter
